# Patient Record
Sex: Female | Born: 1985 | Race: Black or African American | Hispanic: No | Marital: Single | Smoking: Never smoker
Health system: Southern US, Community
[De-identification: ages and names within clinical notes are randomized; demographics above are authoritative.]

## PROBLEM LIST (undated history)

## (undated) ENCOUNTER — Inpatient Hospital Stay (HOSPITAL_COMMUNITY): Payer: Self-pay

## (undated) DIAGNOSIS — T783XXA Angioneurotic edema, initial encounter: Secondary | ICD-10-CM

## (undated) DIAGNOSIS — K649 Unspecified hemorrhoids: Secondary | ICD-10-CM

## (undated) DIAGNOSIS — M549 Dorsalgia, unspecified: Secondary | ICD-10-CM

## (undated) DIAGNOSIS — Z8669 Personal history of other diseases of the nervous system and sense organs: Secondary | ICD-10-CM

## (undated) DIAGNOSIS — R7303 Prediabetes: Secondary | ICD-10-CM

## (undated) DIAGNOSIS — E249 Cushing's syndrome, unspecified: Secondary | ICD-10-CM

## (undated) DIAGNOSIS — M1990 Unspecified osteoarthritis, unspecified site: Secondary | ICD-10-CM

## (undated) DIAGNOSIS — Z86718 Personal history of other venous thrombosis and embolism: Secondary | ICD-10-CM

## (undated) DIAGNOSIS — M2550 Pain in unspecified joint: Secondary | ICD-10-CM

## (undated) DIAGNOSIS — K219 Gastro-esophageal reflux disease without esophagitis: Secondary | ICD-10-CM

## (undated) DIAGNOSIS — R011 Cardiac murmur, unspecified: Secondary | ICD-10-CM

## (undated) DIAGNOSIS — I10 Essential (primary) hypertension: Secondary | ICD-10-CM

## (undated) DIAGNOSIS — R079 Chest pain, unspecified: Secondary | ICD-10-CM

## (undated) DIAGNOSIS — Z319 Encounter for procreative management, unspecified: Secondary | ICD-10-CM

## (undated) DIAGNOSIS — R002 Palpitations: Secondary | ICD-10-CM

## (undated) DIAGNOSIS — G4730 Sleep apnea, unspecified: Secondary | ICD-10-CM

## (undated) DIAGNOSIS — R160 Hepatomegaly, not elsewhere classified: Secondary | ICD-10-CM

## (undated) DIAGNOSIS — G43019 Migraine without aura, intractable, without status migrainosus: Secondary | ICD-10-CM

## (undated) DIAGNOSIS — R0602 Shortness of breath: Secondary | ICD-10-CM

## (undated) DIAGNOSIS — D497 Neoplasm of unspecified behavior of endocrine glands and other parts of nervous system: Secondary | ICD-10-CM

## (undated) DIAGNOSIS — O26899 Other specified pregnancy related conditions, unspecified trimester: Secondary | ICD-10-CM

## (undated) DIAGNOSIS — G5600 Carpal tunnel syndrome, unspecified upper limb: Secondary | ICD-10-CM

## (undated) DIAGNOSIS — K829 Disease of gallbladder, unspecified: Secondary | ICD-10-CM

## (undated) DIAGNOSIS — Z3490 Encounter for supervision of normal pregnancy, unspecified, unspecified trimester: Secondary | ICD-10-CM

## (undated) DIAGNOSIS — N926 Irregular menstruation, unspecified: Secondary | ICD-10-CM

## (undated) DIAGNOSIS — E559 Vitamin D deficiency, unspecified: Secondary | ICD-10-CM

## (undated) DIAGNOSIS — R519 Headache, unspecified: Secondary | ICD-10-CM

## (undated) DIAGNOSIS — K5900 Constipation, unspecified: Secondary | ICD-10-CM

## (undated) DIAGNOSIS — M7989 Other specified soft tissue disorders: Secondary | ICD-10-CM

## (undated) DIAGNOSIS — D649 Anemia, unspecified: Secondary | ICD-10-CM

## (undated) DIAGNOSIS — R319 Hematuria, unspecified: Secondary | ICD-10-CM

## (undated) DIAGNOSIS — M722 Plantar fascial fibromatosis: Secondary | ICD-10-CM

## (undated) DIAGNOSIS — L509 Urticaria, unspecified: Secondary | ICD-10-CM

## (undated) DIAGNOSIS — G935 Compression of brain: Secondary | ICD-10-CM

## (undated) HISTORY — DX: Dorsalgia, unspecified: M54.9

## (undated) HISTORY — DX: Hepatomegaly, not elsewhere classified: R16.0

## (undated) HISTORY — DX: Personal history of other venous thrombosis and embolism: Z86.718

## (undated) HISTORY — DX: Cardiac murmur, unspecified: R01.1

## (undated) HISTORY — DX: Carpal tunnel syndrome, unspecified upper limb: G56.00

## (undated) HISTORY — DX: Neoplasm of unspecified behavior of endocrine glands and other parts of nervous system: D49.7

## (undated) HISTORY — DX: Personal history of other diseases of the nervous system and sense organs: Z86.69

## (undated) HISTORY — DX: Headache, unspecified: R51.9

## (undated) HISTORY — DX: Hematuria, unspecified: R31.9

## (undated) HISTORY — DX: Urticaria, unspecified: L50.9

## (undated) HISTORY — DX: Vitamin D deficiency, unspecified: E55.9

## (undated) HISTORY — PX: OTHER SURGICAL HISTORY: SHX169

## (undated) HISTORY — DX: Cushing's syndrome, unspecified: E24.9

## (undated) HISTORY — DX: Prediabetes: R73.03

## (undated) HISTORY — DX: Unspecified osteoarthritis, unspecified site: M19.90

## (undated) HISTORY — DX: Gastro-esophageal reflux disease without esophagitis: K21.9

## (undated) HISTORY — DX: Chest pain, unspecified: R07.9

## (undated) HISTORY — PX: TONSILLECTOMY: SUR1361

## (undated) HISTORY — DX: Essential (primary) hypertension: I10

## (undated) HISTORY — DX: Irregular menstruation, unspecified: N92.6

## (undated) HISTORY — DX: Pain in unspecified joint: M25.50

## (undated) HISTORY — DX: Palpitations: R00.2

## (undated) HISTORY — DX: Shortness of breath: R06.02

## (undated) HISTORY — DX: Unspecified hemorrhoids: K64.9

## (undated) HISTORY — DX: Other specified soft tissue disorders: M79.89

## (undated) HISTORY — DX: Disease of gallbladder, unspecified: K82.9

## (undated) HISTORY — DX: Anemia, unspecified: D64.9

## (undated) HISTORY — DX: Encounter for supervision of normal pregnancy, unspecified, unspecified trimester: Z34.90

## (undated) HISTORY — DX: Plantar fascial fibromatosis: M72.2

## (undated) HISTORY — DX: Compression of brain: G93.5

## (undated) HISTORY — DX: Other specified pregnancy related conditions, unspecified trimester: G56.00

## (undated) HISTORY — DX: Encounter for procreative management, unspecified: Z31.9

## (undated) HISTORY — DX: Angioneurotic edema, initial encounter: T78.3XXA

## (undated) HISTORY — DX: Constipation, unspecified: K59.00

## (undated) HISTORY — DX: Carpal tunnel syndrome, unspecified upper limb: O26.899

## (undated) HISTORY — DX: Migraine without aura, intractable, without status migrainosus: G43.019

---

## 2006-10-13 ENCOUNTER — Ambulatory Visit: Payer: Self-pay | Admitting: Cardiology

## 2006-11-17 ENCOUNTER — Ambulatory Visit: Payer: Self-pay | Admitting: Cardiology

## 2007-10-18 ENCOUNTER — Other Ambulatory Visit: Admission: RE | Admit: 2007-10-18 | Discharge: 2007-10-18 | Payer: Self-pay | Admitting: Obstetrics and Gynecology

## 2007-11-15 ENCOUNTER — Other Ambulatory Visit: Admission: RE | Admit: 2007-11-15 | Discharge: 2007-11-15 | Payer: Self-pay | Admitting: Obstetrics and Gynecology

## 2008-08-26 ENCOUNTER — Other Ambulatory Visit: Admission: RE | Admit: 2008-08-26 | Discharge: 2008-08-26 | Payer: Self-pay | Admitting: Obstetrics & Gynecology

## 2009-02-03 ENCOUNTER — Other Ambulatory Visit: Admission: RE | Admit: 2009-02-03 | Discharge: 2009-02-03 | Payer: Self-pay | Admitting: Obstetrics and Gynecology

## 2009-09-15 ENCOUNTER — Encounter: Payer: Self-pay | Admitting: Physician Assistant

## 2009-09-24 ENCOUNTER — Encounter: Payer: Self-pay | Admitting: Physician Assistant

## 2009-10-10 DIAGNOSIS — E249 Cushing's syndrome, unspecified: Secondary | ICD-10-CM

## 2009-10-10 DIAGNOSIS — R7303 Prediabetes: Secondary | ICD-10-CM

## 2009-10-10 HISTORY — DX: Prediabetes: R73.03

## 2009-10-10 HISTORY — DX: Cushing's syndrome, unspecified: E24.9

## 2009-10-10 LAB — CONVERTED CEMR LAB: Pap Smear: NORMAL

## 2010-02-08 ENCOUNTER — Encounter: Payer: Self-pay | Admitting: Physician Assistant

## 2010-02-08 ENCOUNTER — Other Ambulatory Visit: Admission: RE | Admit: 2010-02-08 | Discharge: 2010-02-08 | Payer: Self-pay | Admitting: Obstetrics and Gynecology

## 2010-06-21 ENCOUNTER — Emergency Department (HOSPITAL_COMMUNITY): Admission: EM | Admit: 2010-06-21 | Discharge: 2010-06-22 | Payer: Self-pay | Admitting: Emergency Medicine

## 2010-06-23 ENCOUNTER — Ambulatory Visit: Payer: Self-pay | Admitting: Family Medicine

## 2010-06-23 DIAGNOSIS — R0789 Other chest pain: Secondary | ICD-10-CM | POA: Insufficient documentation

## 2010-06-23 DIAGNOSIS — D649 Anemia, unspecified: Secondary | ICD-10-CM | POA: Insufficient documentation

## 2010-06-23 DIAGNOSIS — R609 Edema, unspecified: Secondary | ICD-10-CM | POA: Insufficient documentation

## 2010-06-24 ENCOUNTER — Encounter: Payer: Self-pay | Admitting: Family Medicine

## 2010-06-25 ENCOUNTER — Encounter: Payer: Self-pay | Admitting: Physician Assistant

## 2010-06-28 ENCOUNTER — Telehealth: Payer: Self-pay | Admitting: Family Medicine

## 2010-06-29 ENCOUNTER — Telehealth: Payer: Self-pay | Admitting: Physician Assistant

## 2010-06-29 LAB — CONVERTED CEMR LAB
BUN: 9 mg/dL (ref 6–23)
CO2: 30 meq/L (ref 19–32)
Calcium: 8.5 mg/dL (ref 8.4–10.5)
Chloride: 104 meq/L (ref 96–112)
Creatinine, Ser: 0.78 mg/dL (ref 0.40–1.20)
Ferritin: 4 ng/mL — ABNORMAL LOW (ref 10–291)
HCT: 37.7 % (ref 36.0–46.0)
Iron: 31 ug/dL — ABNORMAL LOW (ref 42–145)
MCHC: 31.3 g/dL (ref 30.0–36.0)
Platelets: 428 10*3/uL — ABNORMAL HIGH (ref 150–400)
RDW: 16.4 % — ABNORMAL HIGH (ref 11.5–15.5)
TSH: 0.898 microintl units/mL (ref 0.350–4.500)
WBC: 10.4 10*3/uL (ref 4.0–10.5)

## 2010-07-07 ENCOUNTER — Ambulatory Visit: Payer: Self-pay | Admitting: Family Medicine

## 2010-07-07 ENCOUNTER — Encounter: Payer: Self-pay | Admitting: Physician Assistant

## 2010-07-07 DIAGNOSIS — I499 Cardiac arrhythmia, unspecified: Secondary | ICD-10-CM | POA: Insufficient documentation

## 2010-07-07 DIAGNOSIS — E663 Overweight: Secondary | ICD-10-CM | POA: Insufficient documentation

## 2010-07-07 DIAGNOSIS — E669 Obesity, unspecified: Secondary | ICD-10-CM | POA: Insufficient documentation

## 2010-07-16 ENCOUNTER — Telehealth: Payer: Self-pay | Admitting: Physician Assistant

## 2010-07-30 ENCOUNTER — Telehealth: Payer: Self-pay | Admitting: Family Medicine

## 2010-08-02 ENCOUNTER — Encounter: Payer: Self-pay | Admitting: Physician Assistant

## 2010-08-02 LAB — CONVERTED CEMR LAB
BUN: 7 mg/dL (ref 6–23)
Calcium: 8.9 mg/dL (ref 8.4–10.5)
Chloride: 103 meq/L (ref 96–112)
Creatinine, Ser: 0.71 mg/dL (ref 0.40–1.20)
Glucose, Bld: 99 mg/dL (ref 70–99)

## 2010-08-04 ENCOUNTER — Encounter: Payer: Self-pay | Admitting: Family Medicine

## 2010-08-04 ENCOUNTER — Telehealth: Payer: Self-pay | Admitting: Family Medicine

## 2010-08-26 ENCOUNTER — Ambulatory Visit: Payer: Self-pay | Admitting: Family Medicine

## 2010-08-26 DIAGNOSIS — R635 Abnormal weight gain: Secondary | ICD-10-CM | POA: Insufficient documentation

## 2010-08-26 DIAGNOSIS — I10 Essential (primary) hypertension: Secondary | ICD-10-CM | POA: Insufficient documentation

## 2010-08-27 ENCOUNTER — Encounter: Payer: Self-pay | Admitting: Family Medicine

## 2010-09-06 ENCOUNTER — Telehealth: Payer: Self-pay | Admitting: Endocrinology

## 2010-09-07 ENCOUNTER — Ambulatory Visit: Payer: Self-pay | Admitting: Endocrinology

## 2010-09-08 ENCOUNTER — Ambulatory Visit: Payer: Self-pay | Admitting: Endocrinology

## 2010-09-10 ENCOUNTER — Encounter: Payer: Self-pay | Admitting: Endocrinology

## 2010-09-10 LAB — CONVERTED CEMR LAB

## 2010-09-14 ENCOUNTER — Ambulatory Visit: Payer: Self-pay | Admitting: Endocrinology

## 2010-09-14 DIAGNOSIS — E249 Cushing's syndrome, unspecified: Secondary | ICD-10-CM | POA: Insufficient documentation

## 2010-09-14 LAB — CONVERTED CEMR LAB: Cortisol, Plasma: 16.3 ug/dL

## 2010-09-15 ENCOUNTER — Telehealth: Payer: Self-pay | Admitting: Endocrinology

## 2010-09-16 ENCOUNTER — Encounter: Payer: Self-pay | Admitting: Endocrinology

## 2010-09-16 ENCOUNTER — Telehealth: Payer: Self-pay | Admitting: Endocrinology

## 2010-09-17 ENCOUNTER — Encounter: Payer: Self-pay | Admitting: Endocrinology

## 2010-09-21 ENCOUNTER — Ambulatory Visit: Payer: Self-pay | Admitting: Family Medicine

## 2010-09-23 ENCOUNTER — Encounter: Payer: Self-pay | Admitting: Family Medicine

## 2010-09-27 LAB — CONVERTED CEMR LAB
Metaneph Total, Ur: 363 ug/24hr (ref 94–604)
Metanephrines, Ur: 50 (ref 25–222)
Normetanephrine, 24H Ur: 313 (ref 40–412)

## 2010-10-01 ENCOUNTER — Encounter: Payer: Self-pay | Admitting: Family Medicine

## 2010-10-28 ENCOUNTER — Encounter: Payer: Self-pay | Admitting: Family Medicine

## 2010-10-28 HISTORY — PX: LAPAROSCOPIC ADRENALECTOMY: SHX999

## 2010-10-29 DIAGNOSIS — E270 Other adrenocortical overactivity: Secondary | ICD-10-CM | POA: Insufficient documentation

## 2010-10-31 ENCOUNTER — Encounter: Payer: Self-pay | Admitting: Family Medicine

## 2010-11-08 ENCOUNTER — Encounter: Payer: Self-pay | Admitting: Family Medicine

## 2010-11-09 NOTE — Assessment & Plan Note (Signed)
Summary: NEW PATIENT- room 1   Vital Signs:  Patient profile:   25 year old female Height:      63.25 inches Weight:      210.75 pounds BMI:     37.17 O2 Sat:      100 % on Room air Pulse rate:   87 / minute Resp:     16 per minute BP sitting:   130 / 90  (left arm)  Vitals Entered By: Adella Hare LPN (June 23, 2010 2:22 PM)  Nutrition Counseling: Patient's BMI is greater than 25 and therefore counseled on weight management options. CC: new patient Is Patient Diabetic? No Comments  pain bilateral legs from knees to feet   CC:  new patient.  History of Present Illness: New pt here to establish care with new PCP.  Pt presents today with c/o swelling bilat LE x approx 1 week.  Worse at end of day.  No prev hx of. Had Lt sided chest pain and pressure day she went to ER ( 9/12).  Was in Lt chest and shoulder area. No difficulty breathing.  Rxd Furosemide and has helped some with swelling.  Has not taken today. No added salt while cooking or eating.  Has been following a diet of veggies, and lean meats mostly. No travel.  Attending college, sits for 2-3 hrs, then long ride home (1hr).  Swelling worse after.    Current Medications (verified): 1)  Loestrin 24 Fe 1-20 Mg-Mcg Tabs (Norethin Ace-Eth Estrad-Fe) .... Once Daily 2)  Furosemide 20 Mg Tabs (Furosemide) .... One Tab By Mouth Two Times A Day  Allergies (verified): 1)  ! Pcn  Past History:  Past medical, surgical, family and social histories (including risk factors) reviewed, and no changes noted (except as noted below).  Past Medical History: PCOS  Past Surgical History: Denies surgical history Tonsillectomy  Family History: Reviewed history and no changes required. mother living- HTN, Hyperlipidemia father living- HTN, OSA one sister living- presumed healthy  Social History: Reviewed history and no changes required. 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 CV:  Complains of chest pain or discomfort; denies palpitations. Resp:  Denies cough and shortness of breath. GI:  Denies abdominal pain.  Physical Exam  General:  Well-developed,well-nourished,in no acute distress; alert,appropriate and cooperative throughout examination Head:  Normocephalic and atraumatic without obvious abnormalities. No apparent alopecia or balding. Ears:  External ear exam shows no significant lesions or deformities.  Otoscopic examination reveals clear canals, tympanic membranes are intact bilaterally without bulging, retraction, inflammation or discharge. Hearing is grossly normal bilaterally. Nose:  External nasal examination shows no deformity or inflammation. Nasal mucosa are pink and moist without lesions or exudates. Mouth:  Oral mucosa and oropharynx without lesions or exudates.  Teeth in good repair. Neck:  No deformities, masses, or tenderness noted. Lungs:  Normal respiratory effort, chest expands symmetrically. Lungs are clear to auscultation, no crackles or wheezes. Heart:  Normal rate and regular rhythm. S1 and S2 normal without gallop, murmur, click, rub or other extra sounds. Pulses:  R posterior tibial normal, R dorsalis pedis normal, L posterior tibial normal, and L dorsalis pedis normal.   Extremities:  Tr PTE bilat Neurologic:  alert & oriented X3, sensation intact to light touch, gait normal, and DTRs symmetrical and normal.  Skin:  Intact without suspicious lesions or rashes Cervical Nodes:  No lymphadenopathy noted Psych:  Cognition and judgment appear intact. Alert and cooperative with normal attention span and concentration. No apparent delusions, illusions, hallucinations   Impression & Recommendations:  Problem # 1:  PERIPHERAL EDEMA (ICD-782.3) Assessment New  Her updated medication list  for this problem includes:    Furosemide 20 Mg Tabs (Furosemide) ..... One tab by mouth two times a day  Orders: T-Basic Metabolic Panel [id number]) T-TSH [phone])  Problem # 2:  ANEMIA (ICD-285.9) Assessment: New  Orders: T-CBC No Diff ([id number]) T-Iron ([id number]) T-Ferritin ([id number])  Problem # 3:  CHEST PAIN, ATYPICAL (ICD-786.59) Assessment: New  Orders: EKG w/ Interpretation (93000)  Complete Medication List: 1)  Loestrin 24 Fe 1-20 Mg-mcg Tabs (Norethin ace-eth estrad-fe) .... Once daily 2)  Furosemide 20 Mg Tabs (Furosemide) .... One tab by mouth two times a day  Other Orders: Influenza Vaccine NON MCR (02725)  Patient Instructions: 1)  Please schedule a follow-up appointment in 2 weeks. 2)  Limit your Sodium (Salt) to less than 2 grams a day(slightly less than 1/2 a teaspoon) to prevent fluid retention, swelling, or worsening of symptoms. 3)  Elevate your legs and wear support stockings. 4)  It is important that you exercise regularly at least 20 minutes 5 times a week. If you develop chest pain, have severe difficulty breathing, or feel very tired , stop exercising immediately and seek medical attention. 5)  You need to lose weight. Consider a lower calorie diet and regular exercise.    Influenza Vaccine    Vaccine Type: Fluvax Non-MCR    Site: left deltoid    Mfr: novartis    Dose: 0.5 ml    Route: IM    Given by: Adella Hare LPN    Exp. Date: 02/2011    Lot #: [id number]    VIS given: 05/04/10 version given June 23, 2010.

## 2010-11-09 NOTE — Progress Notes (Signed)
Summary: lab results  Phone Note Call from Patient   Summary of Call: would like to get results of lab work. [phone] Initial call taken by: Rudene Anda,  June 28, 2010 3:36 PM  Follow-up for Phone Call        returned call, no answer Follow-up by: Adella Hare LPN,  June 28, 2010 4:50 PM

## 2010-11-09 NOTE — Progress Notes (Signed)
Summary: 24hr urine result  Phone Note Call from Patient   Caller: Patient [phone] Summary of Call: Pt called and states she is anxious to get results back of 24 hr urine done last week. Pt says her sxs are worse, increased swelling, numbness in RT and LT hands and HA. Please advise. Initial call taken by: Margaret Pyle, CMA,  September 15, 2010 11:36 AM  Follow-up for Phone Call        i called pt 09/15/10.  i'll refer to [facility] or [facility] Follow-up by: Minus Breeding MD,  September 15, 2010 1:07 PM

## 2010-11-09 NOTE — Assessment & Plan Note (Signed)
Summary: NEW [employer] [facility] PT GAIN 55 LBS IN 6 MTHS-PER LOUANN/DR SIMPSON-...   Vital Signs:  Patient profile:   25 year old female Menstrual status:  regular LMP:     08/20/2010 Height:      63 inches (160.02 cm) Weight:      215 pounds (97.73 kg) BMI:     38.22 O2 Sat:      98 % on Room air Temp:     99.3 degrees F (37.39 degrees C) oral Pulse rate:   105 / minute BP sitting:   122 / 74  (left arm) Cuff size:   large  Vitals Entered By: Brenton Grills CMA Duncan Dull) (September 07, 2010 2:06 PM)  O2 Flow:  Room air CC: New Endo/Abnormal Weight Gain/Cushing's Syndrome?/Dr. Simpson/aj Is Patient Diabetic? No LMP (date): 08/20/2010     Enter LMP: 08/20/2010 Last PAP Result normal   CC:  New Endo/Abnormal Weight Gain/Cushing's Syndrome?/Dr. Simpson/aj.  History of Present Illness: pt states 6 mos of severe weight gain, worst at the abdominal area, and assoc edema.  she was seen at er at [facility] 10 days ago, where she was noted to have generalized red rash.  she also has recently developed htn.  she cannot assess any sxs of slow wound healing, as she has had no recent surgery or injury.  Current Medications (verified): 1)  Ferrous Sulfate 325 (65 Fe) Mg Tabs (Ferrous Sulfate) .... Take 1 Daily 2)  Maxzide-25 37.5-25 Mg Tabs (Triamterene-Hctz) .... Take 1 Tablet By Mouth Once A Day 3)  Multivitamin .... One Daily  Allergies (verified): 1)  ! Pcn  Past History:  Past Medical History: Last updated: 07/07/2010 Unremarkable  Family History: mother living- HTN, Hyperlipidemia father living- HTN, OSA one sister living- presumed healthy  Social History: Reviewed history from 06/23/2010 and no changes required. 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  The patient denies fever.         denies polyuria and rhinorrhea.  she has intermittent  doe.  she has striae on the arms and abdomen.  she just stopped oral contraceptives, so she cannot assess menses.  she has chronic hirsutism, easy bruising, muscle weakness, muscle cramps, insomnia, and hyperhidrosis.  she has hair loss on the head, at the frontal and crown areas.  she says her skin color is darkening in general.  she has numbness of the left little finger.  Physical Exam  General:  morbidly obese.  no distress  Head:  head: no deformity.  i cannot appreciate any  hair loss eyes: no periorbital swelling, no proptosis external nose and ears are normal mouth: no lesion seen Neck:  Supple without thyroid enlargement or tenderness.  Lungs:  Clear to auscultation bilaterally. Normal respiratory effort.  Heart:  Regular rate and rhythm without murmurs or gallops noted. Normal S1,S2.   Abdomen:  abdomen is soft, nontender.  no hepatosplenomegaly.   not distended.  no hernia  Msk:  muscle bulk and strength are grossly normal.  no obvious joint swelling.  gait is normal and steady  Extremities:  trace right pedal edema and trace left pedal edema.   Neurologic:  cn 2-12 grossly intact.   readily moves all 4's.   sensation is intact to touch on the feet  Skin:  there are slightly pink striae at the axillae, abdomen, and flanks there  is moderate hirsutism on the face and abdomen no ecchymoses not diaphoretic. Cervical Nodes:  No significant adenopathy.  Psych:  Alert and cooperative; normal mood and affect; normal attention span and concentration.     Impression & Recommendations:  Problem # 1:  ABNORMAL WEIGHT GAIN (ICD-783.1) endogenous cushing's needs to be excluded  Problem # 2:  HYPERTENSION (ICD-401.9) often seen in cushing's well-controlled  Problem # 3:  hair loss i am unable to appreciate this on exam  Medications Added to Medication List This Visit: 1)  Dexamethasone 1 Mg Tabs (Dexamethasone) .... Take at 9-10 pm, the night before blood test  Other  Orders: T- * Misc. Laboratory test [phone]) Consultation Level IV [phone])  Patient Instructions: 1)  check 24-hr urine for cortisone.  then: 2)  you should do a "dexamethasone suppression test."  for this, you would take dexamethasone 1 mg at 10 pm, then come in for a "cortisol" blood test the next morning before 9 am.  you do not need to be fasting for this test.   3)  please call [phone] to hear each of these test results. 4)  (cortisol 783.1) Prescriptions: DEXAMETHASONE 1 MG TABS (DEXAMETHASONE) take at 9-10 pm, the night before blood test  #1 x 0   Entered and Authorized by:   Minus Breeding MD   Signed by:   Minus Breeding MD on 09/07/2010   Method used:   Electronically to        [facility]* (retail)       [address]       [place], [place]  [address]       Ph: [phone]       Fax: [phone]   RxID:   [id number]    Orders Added: 1)  T- * Misc. Laboratory test [99999] 2)  Consultation Level IV [25956]    Preventive Care Screening  Pap Smear:    Date:  10/10/2009    Results:  normal   Last Tetanus Booster:    Date:  10/11/2003    Results:  Historical

## 2010-11-09 NOTE — Progress Notes (Signed)
Summary: CALL BACK  Phone Note Call from Patient   Summary of Call: CALL BACK AT [phone] Initial call taken by: Lind Guest,  June 29, 2010 2:03 PM  Follow-up for Phone Call        can we send in some different birth control meds? generic Follow-up by: Adella Hare LPN,  June 29, 2010 2:45 PM  Additional Follow-up for Phone Call Additional follow up Details #1::        We are not the rxing provider for her birth control. Additional Follow-up by: Esperanza Sheets PA,  June 29, 2010 2:56 PM    Additional Follow-up for Phone Call Additional follow up Details #2::    she wants to know if you will though since she is coming here now Follow-up by: Adella Hare LPN,  June 29, 2010 3:11 PM  Additional Follow-up for Phone Call Additional follow up Details #3:: Details for Additional Follow-up Action Taken: I have changed her OCP prescription in computer to Aviane.  This is generic. She may have 1 mos (28 day).  We will need pap results if has been < 1hr since her last pap, or she will need to come in for pap if has been > 1 yr before further refills. Once you find out the pharmacy she wishes to use , please send prescription to pharmacy. Additional Follow-up by: Esperanza Sheets PA,  June 29, 2010 3:27 PM  New/Updated Medications: AVIANE 0.1-20 MG-MCG TABS (LEVONORGESTREL-ETHINYL ESTRAD) take 1 daily Prescriptions: AVIANE 0.1-20 MG-MCG TABS (LEVONORGESTREL-ETHINYL ESTRAD) take 1 daily  #28day x 0   Entered by:   Adella Hare LPN   Authorized by:   Esperanza Sheets PA   Signed by:   Adella Hare LPN on 60/07/9322   Method used:   Electronically to        [facility]* (retail)       [address]       [place], [place]  [address]       Ph: [phone]       Fax: [phone]   RxID:   [id number] AVIANE 0.1-20 MG-MCG TABS (LEVONORGESTREL-ETHINYL ESTRAD) take 1 daily  #28day x 0   Entered and Authorized by:   Esperanza Sheets PA   Signed by:   Esperanza Sheets PA on 06/29/2010   Method used:   Historical   RxID:   [id number]

## 2010-11-09 NOTE — Progress Notes (Signed)
  Phone Note Call from Patient   Summary of Call: Since starting phentermine, she has had dry mouth, tip of tongue feeling numb like and vaginal area has been red and irritated and she had been taking the pill everymorning. She has not seen any results with it anyway so I advised her to stop taking it. She wanted to know if these were common side effect and if she needed something for the vaginal irritation   [facility]  Initial call taken by: Everitt Amber LPN,  July 16, 2010 3:43 PM  Follow-up for Phone Call        Dryness is a common side effect.  She can wait and see if the vaginal irritation improves with discontinuing, or she can try an over the counter yeast cream to see if helps.  Otherwise will need appt next week to check her. Follow-up by: Esperanza Sheets PA,  July 16, 2010 3:46 PM  Additional Follow-up for Phone Call Additional follow up Details #1::        called patient, no answer Additional Follow-up by: Everitt Amber LPN,  July 16, 2010 4:13 PM    Additional Follow-up for Phone Call Additional follow up Details #2::    Patient feeling better. Follow-up by: Everitt Amber LPN,  July 19, 2010 9:41 AM

## 2010-11-09 NOTE — Progress Notes (Signed)
Summary: please advise  Phone Note Call from Patient   Summary of Call: patient left msg. stating she has numbness in her left pinky going down, she has been having a lot of muscle cramps, very tired, she states she has swelling, and she stopped taking her lactus (didn't see the correct spelling) 1 week ago.  She will be doing clinicals this afternoon at the hospital her contact number is [phone].  She said you could leave her a message.  She thinks the leg cramps are coming from low potassium.Please advise. Initial call taken by: Curtis Sites,  July 30, 2010 3:04 PM  Follow-up for Phone Call        fyi, when patient's call after midday on a friday, pld mke them aeaware there will be no response before the following week unless it is an emergency. She needs to take potassium with her lasix, i will enter this in a historic script, pls fax after you speak to her, she should also get a chem 7 repeated, not stat, pls order Follow-up by: Syliva Overman MD,  August 02, 2010 6:12 AM    New/Updated Medications: KLOR-CON M20 20 MEQ CR-TABS (POTASSIUM CHLORIDE CRYS CR) Take 1 tablet by mouth two times a day on the same days that you take furosemide Prescriptions: KLOR-CON M20 20 MEQ CR-TABS (POTASSIUM CHLORIDE CRYS CR) Take 1 tablet by mouth two times a day on the same days that you take furosemide  #60 x 0   Entered and Authorized by:   Syliva Overman MD   Signed by:   Syliva Overman MD on 08/02/2010   Method used:   Historical   RxID:   [id number]   Appended Document: please advise Prescriptions: KLOR-CON M20 20 MEQ CR-TABS (POTASSIUM CHLORIDE CRYS CR) Take 1 tablet by mouth two times a day on the same days that you take furosemide  #60 x 0   Entered by:   Mauricia Area CMA   Authorized by:   Syliva Overman MD   Signed by:   Mauricia Area CMA on 08/02/2010   Method used:   Electronically to        [facility]* (retail)       [address]       [place], [place]  [address]       Ph: [phone]       Fax: [phone]   RxID:   [id number]   Appended Document: please advise Called, left message  Appended Document: please advise Patient aware

## 2010-11-09 NOTE — Progress Notes (Signed)
Summary: NEW PT  Phone Note Call from Patient Call back at Home Phone [phone]   Summary of Call: Pt will be new endo pt tomorrow and was told by [facility] she may have cushing's. She wants to know if she can go ahead and collect a 24hr urine sample to bring in tomorrow?  Initial call taken by: Lamar Sprinkles, CMA,  September 06, 2010 9:34 AM  Follow-up for Phone Call        let's address at [facility] tomorrow Follow-up by: Minus Breeding MD,  September 06, 2010 9:35 AM  Additional Follow-up for Phone Call Additional follow up Details #1::        Notified pt with md reponse Additional Follow-up by: Orlan Leavens RMA,  September 06, 2010 11:50 AM

## 2010-11-09 NOTE — Progress Notes (Signed)
  Phone Note Call from Patient Call back at [facility] Phone [phone]   Caller: Patient [phone] Summary of Call: Pt called to ask MD why she is being referred to another Endo, please advise. Initial call taken by: Margaret Pyle, CMA,  September 15, 2010 3:07 PM  Follow-up for Phone Call        the high cortisone level is a rare medical condition, which should be checked at a big medical center.  very few doctors have experience with this.   Follow-up by: Minus Breeding MD,  September 15, 2010 5:21 PM  Additional Follow-up for Phone Call Additional follow up Details #1::        Pt advised and has appt today  Additional Follow-up by: Margaret Pyle, CMA,  September 16, 2010 8:53 AM

## 2010-11-09 NOTE — Letter (Signed)
Summary: menu meal plan  menu meal plan   Imported By: Lind Guest 08/27/2010 14:39:02  _____________________________________________________________________  External Attachment:    Type:   Image     Comment:   External Document

## 2010-11-09 NOTE — Progress Notes (Signed)
Summary: 24hr urine?  Phone Note Call from Patient Call back at Home Phone [phone]   Caller: Patient Summary of Call: Pt called stating she has appt today with Endo specialist but wnats to know what Dr Everardo All would like her to do with 24hr urine specimen, she says she was advised by MD that he was going to be testing for something else. Please advise. Initial call taken by: Margaret Pyle, CMA,  September 16, 2010 9:03 AM  Follow-up for Phone Call        please return the 2nd specimen to the lab also Follow-up by: Minus Breeding MD,  September 16, 2010 12:46 PM  Additional Follow-up for Phone Call Additional follow up Details #1::        Pt advised and will drop off specimen today Additional Follow-up by: Margaret Pyle, CMA,  September 16, 2010 1:22 PM

## 2010-11-09 NOTE — Letter (Signed)
Summary: [facility]  [facility]   Imported By: Lind Guest 07/07/2010 14:24:02  _____________________________________________________________________  External Attachment:    Type:   Image     Comment:   External Document

## 2010-11-09 NOTE — Letter (Signed)
Summary: Immunization/Shot Record  [facility]  [address]   [place], [place] [address]   Phone: [phone]  Fax: [phone]     Immunization Record for: Janet [name]  Vaccine 1 2 3 4 5 6  HepB Hepatitis B                    DTP Diphtheria, Tetanus, Pertussis                         HIB Haemophilus influenzae Type b                 ZHYQMVHQIO IPV Inactivated Poliovirus             MMR Measles, Mumps, [name] NGEXBMWUXL KGMWNUUVOZ Varicella Varivax    DGUYQIHKVQ QVZDGLOVFI EPPIRJJOAC Pneumococcal           Hep A Hepatitis A   ZYSAYTKZSW FUXNATFTDD UKGURKYHCW CBJSEGBTDV        Tetanus Booster Date of Last:    Flu Shot Date of Last: 06/23/2010 Pneumovax Date of Last:  Meningococcal Vaccine Given:       Other Vaccines HPV Vaccine/ Date of Last:    Vaccine/ Date of Last:    Vaccine/ Date of Last:     VOHYWVPXTG  GYIRSWNIOE  VOJJKKXFGH Rotavirus Vaccine/ Date of Last:    Vaccine/ Date of Last:    Vaccine/ Date of Last:     WEXHBZJIRC  [name]  VELFYBOFBP Zostavax Vaccine/ Date of Last:     [name]  ZWCHENIDPO  [name]  EUMPNTIRWE  RXVQMGQQPY  Recommended Childhood and Adolescent Immunization Schedule [place]  2006 Vaccine Age Birth 1 mos 2 mos 4 mos 6 mos 12 mos 15 mos 18 mos 24 mos 4-6 yrs 11-12 yrs 13-14 yrs 15 yrs 16-18 yrs Hepatitis B1 HepB HepB HepB1  HepB  HepB Series Catch-Up Diphtheria, Tetanus, Pertussis2   DTaP DTaP DTaP   DTaP  DTaP Tdap  Tdap Catch-Up Haemophilus influenzae type b3   Hib Hib Hib3 Hib        Inactivated Poliovirus   IPV IPV  IPV   IPV     Measles, Mumps, Rubella4      MMR   MMR M MR MMR Catch-Up Varicella5       Varicella  Varicella  Catch-Up Meningo-coccal6           MCV4  MCV4 CatchUpV4           MPSV4 for High Risk Groups  C MCV4 for High Risk Groups Pneumo-coccal7   PCV PCV PCV PCV   PCV  Catch-Up PPV for High Risk Groups         PPV for High Risk Groups  Influenza8      Influenza (Yearly)  Influenza (Yearly) for High Risk Groups Hepatitis A9       HepA Series  This schedule indicates the recommended ages for routine administration of currently licensed childhood vaccines, as of September 09, 2004, for children through age 40 years. Any dose not administered at the recommended age should be administered at any subsequent visit when indicated and feasible. Indicates age groups that warrant special effort to administer those vaccines not previously administered. Additional vaccines may be licensed and recommended during the year. Licensed combination vaccines may be used whenever any components of the combination are indicated and other components of the vaccine are not contraindicated and if approved by the Food and Drug  Administration for that dose of the series. Providers should consult the respective ACIP statement for detailed recommendations. Clinically significant adverse events that follow immunization should be reported to the Vaccine Adverse Event Reporting System (VAERS). Guidance about how to obtain and complete a VAERS form is available at www.vaers.LAgents.no or by telephone, [phone].  The Childhood and Adolescent Immunization Schedule is approved by: Advisory Committee on Administrator http://www.wade.com/   American Academy of Pediatrics BridgeDigest.com.cy   [employer].aafp.org

## 2010-11-09 NOTE — Assessment & Plan Note (Signed)
Summary: follow up- room 2   Vital Signs:  Patient profile:   25 year old female Height:      63.25 inches Weight:      214.25 pounds BMI:     37.79 O2 Sat:      100 % on Room air Pulse rate:   73 / minute Resp:     16 per minute BP sitting:   124 / 80  (left arm)  Vitals Entered By: Adella Hare LPN (July 07, 2010 10:52 AM) CC: follow-up visit- legs swollen hurting Is Patient Diabetic? No Pain Assessment Patient in pain? no        CC:  follow-up visit- legs swollen hurting.  History of Present Illness: Pt states that she ran out of Lasix a few days ago this did seem to help, though she would still have swelling at the end of the day.  The swelling started when she started OCPs about 2 mos ago. She started the OCPs because she was put on Metformin for PCOS.  She was dx with PCOS because of her wt gain and an elevated testosterone level.  Her LH:FSH ratio was not elevated, and no PUS was done.  Pt does not have irrg menses or amenorrhea, nor acne.  Mild increased hair growth lower abd.  She discontinued Metformin about 6 weeks ago due to GI intolerance, and wants to discontinue the OCPs as well. Has been working with a nutriotionist and exercising to try to help with her wt.    Allergies (verified): 1)  ! Pcn  Past History:  PMH updated  Past Medical History: Unremarkable  Review of Systems CV:  Denies chest pain or discomfort and palpitations. Resp:  Denies cough and shortness of breath. GI:  Denies abdominal pain, change in bowel habits, nausea, and vomiting. GU:  Denies abnormal vaginal bleeding.  Physical Exam  General:  Well-developed,well-nourished,in no acute distress; alert,appropriate and cooperative throughout examination Head:  Normocephalic and atraumatic without obvious abnormalities. No apparent alopecia or balding. Ears:  External ear exam shows no significant lesions or deformities.  Otoscopic examination reveals clear canals, tympanic membranes  are intact bilaterally without bulging, retraction, inflammation or discharge. Hearing is grossly normal bilaterally. Nose:  External nasal examination shows no deformity or inflammation. Nasal mucosa are pink and moist without lesions or exudates. Mouth:  Oral mucosa and oropharynx without lesions or exudates.  Teeth in good repair. Neck:  No deformities, masses, or tenderness noted.no thyromegaly and no thyroid nodules or tenderness.   Lungs:  Normal respiratory effort, chest expands symmetrically. Lungs are clear to auscultation, no crackles or wheezes. Heart:  no murmur and irregular rhythm.   Pulses:  R posterior tibial normal, R dorsalis pedis normal, L posterior tibial normal, and L dorsalis pedis normal.   Extremities:  trace left pedal edema and trace right pedal edema.   Neurologic:  alert & oriented X3 and gait normal.   Cervical Nodes:  No lymphadenopathy noted Psych:  Cognition and judgment appear intact. Alert and cooperative with normal attention span and concentration. No apparent delusions, illusions, hallucinations   Impression & Recommendations:  Problem # 1:  PERIPHERAL EDEMA (ICD-782.3) Assessment Unchanged Question related to OCP use.  Pt will discontinue use and end of this pill pack ( 1 1/2  weeks) Continue low sodium diet, elevating legs, etc. Her updated medication list for this problem includes:    Furosemide 20 Mg Tabs (Furosemide) ..... One tab by mouth two times a day  Problem #  2:  OBESITY (ICD-278.00) Assessment: Deteriorated Discussed PCOS dx with pt.  I dont think pt meets dx criteria, and would suggest that if she were to develop irrg menses then to obtain PUS. Pt to continue with increased exercise and healthy diet.  Will try Phentermine.  Complete Medication List: 1)  Aviane 0.1-20 Mg-mcg Tabs (Levonorgestrel-ethinyl estrad) .... Take 1 daily 2)  Furosemide 20 Mg Tabs (Furosemide) .... One tab by mouth two times a day 3)  Ferrous Sulfate 325 (65 Fe)  Mg Tabs (Ferrous sulfate) .... Take 1 daily 4)  Phentermine Hcl 37.5 Mg Caps (Phentermine hcl) .... Take 1 every morning  Other Orders: EKG w/ Interpretation (93000)  Patient Instructions: 1)  Please schedule a follow-up appointment in 2 months. 2)  I have refilled Furosemide for swelling.  Stop your birth control at the end of the pack as discussed. 3)  I have prescribed a diet pill to help you with weight loss. Prescriptions: PHENTERMINE HCL 37.5 MG CAPS (PHENTERMINE HCL) take 1 every morning  #30 x 1   Entered and Authorized by:   Esperanza Sheets PA   Signed by:   Esperanza Sheets PA on 07/07/2010   Method used:   Printed then faxed to ...       [facility]* (retail)       [address]       [place], [place]  [address]       Ph: [phone]       Fax: [phone]   RxID:   [id number] FUROSEMIDE 20 MG TABS (FUROSEMIDE) one tab by mouth two times a day  #30 x 0   Entered and Authorized by:   Esperanza Sheets PA   Signed by:   Esperanza Sheets PA on 07/07/2010   Method used:   Electronically to        [facility]* (retail)       [address]       [place], [place]  [address]       Ph: [phone]       Fax: [phone]   RxID:   [id number]

## 2010-11-09 NOTE — Assessment & Plan Note (Signed)
Summary: OV   Vital Signs:  Patient profile:   25 year old female Menstrual status:  regular LMP:     08/22/2010 Height:      63 inches Weight:      217.04 pounds O2 Sat:      96 % Pulse rate:   60 / minute Pulse rhythm:   regular BP sitting:   160 / 100  (left arm)  History of Present Illness: Pt states that in May 2011 she weighed 156.Has gained to 217 poundstoday, despite exercise, trainer ,and a nutrtionist she had never been over 160 pounds beforethis. She had been seeing a Systems analyst regularly sinceSummer 2011, and a nutritionist since Jubne 2010, just for health. At that time she weighed 142. She has always exercise regularly, but tis year she inc to twice daily,at least 1 hr daily.  Feels tight, swollen, alll the timefatigue, palpitations occasionally, states pulse irregular, abd distension, striae on  trunk and upper extremeties. No  known famh/o adrenal disease She denies depression, and over the years , in particular this past year she has been espescially diligent in adopting even healthier lifestyle as far as exercfise and diet are concerned to no avail    Allergies: 1)  ! Pcn  Review of Systems      See HPI General:  Complains of fatigue. Eyes:  Denies blurring and discharge. ENT:  Denies hoarseness, nasal congestion, and sinus pressure. CV:  Denies chest pain or discomfort, palpitations, and swelling of feet. Resp:  Denies cough, sputum productive, and wheezing. GI:  Denies abdominal pain, constipation, diarrhea, nausea, and vomiting. GU:  Denies dysuria and urinary frequency. MS:  Denies joint pain and stiffness. Derm:  Denies itching, lesion(s), and rash. Neuro:  Denies falling down, headaches, memory loss, and numbness. Psych:  Denies anxiety and depression. Endo:  Complains of weight change; denies cold intolerance, excessive hunger, excessive thirst, excessive urination, heat intolerance, and polyuria. Heme:  Denies abnormal bruising and  bleeding. Allergy:  Denies hives or rash and itching eyes.  Physical Exam  General:  Well-developed,obese iin no acute distress; alert,appropriate and cooperative throughout examination HEENT: No facial asymmetry,  EOMI, No sinus tenderness, TM's Clear, oropharynx  pink and moist.   Chest: Clear to auscultation bilaterally.  CVS: S1, S2, No murmurs, No S3.   Abd: Soft, Nontender.  MS: Adequate ROM spine, hips, shoulders and knees.  Ext: No edema.   CNS: CN 2-12 intact, power tone and sensation normal throughout.   Skin: Intact, striae Psych: Good eye contact, normal affect.  Memory intact, not anxious or depressed appearing.    Impression & Recommendations:  Problem # 1:  HYPERTENSION (ICD-401.9) Assessment Deteriorated  The following medications were removed from the medication list:    Furosemide 20 Mg Tabs (Furosemide) ..... One tab by mouth two times a day Her updated medication list for this problem includes:    Maxzide-25 37.5-25 Mg Tabs (Triamterene-hctz) .Marland Kitchen... Take 1 tablet by mouth once a day  BP today: 160/100 Prior BP: 124/80 (07/07/2010)  Labs Reviewed: K+: 4.3 (08/02/2010) Creat: : 0.71 (08/02/2010)     Problem # 2:  ABNORMAL WEIGHT GAIN (ICD-783.1) Assessment: Deteriorated  Orders: Endocrinology Referral (Endocrine), i have a real concern that there is underlying pathology causing thisexcessive weight gain  Problem # 3:  ANEMIA (ICD-285.9) Assessment: Comment Only  Her updated medication list for this problem includes:    Ferrous Sulfate 325 (65 Fe) Mg Tabs (Ferrous sulfate) .Marland Kitchen... Take 1 daily  Hgb:  11.8 (06/25/2010)   Hct: 37.7 (06/25/2010)   Platelets: 428 (06/25/2010) RBC: 4.57 (06/25/2010)   RDW: 16.4 (06/25/2010)   WBC: 10.4 (06/25/2010) MCV: 82.5 (06/25/2010)   MCHC: 31.3 (06/25/2010) Ferritin: 4 (06/25/2010) Iron: 31 (06/25/2010)   TSH: 0.898 (06/25/2010)  Complete Medication List: 1)  Ferrous Sulfate 325 (65 Fe) Mg Tabs (Ferrous sulfate)  .... Take 1 daily 2)  Maxzide-25 37.5-25 Mg Tabs (Triamterene-hctz) .... Take 1 tablet by mouth once a day 3)  Multivitamin  .... One daily  Patient Instructions: 1)  Please schedule a follow-up appointment in 1 month. 2)  It is important that you exercise regularly at least 20 minutes 5 times a week. If you develop chest pain, have severe difficulty breathing, or feel very tired , stop exercising immediately and seek medical attention. 3)  You need to lose weight. Consider a lower calorie diet and regular exercise.  4)  You will start anew med for HTN which also has a diuretic. 5)  I am referring you to a endocrinologist to get help in establishing the cause of your weight  gain and getting you help  Prescriptions: MAXZIDE-25 37.5-25 MG TABS (TRIAMTERENE-HCTZ) Take 1 tablet by mouth once a day  #30 x 2   Entered and Authorized by:   Syliva Overman MD   Signed by:   Syliva Overman MD on 08/26/2010   Method used:   Electronically to        [facility]* (retail)       [address]       [place], [place]  [address]       Ph: [phone]       Fax: [phone]   RxID:   [id number]    Orders Added: 1)  Est. Patient Level IV [64403] 2)  Endocrinology Referral [Endocrine]

## 2010-11-09 NOTE — Letter (Signed)
Summary: GYNECOLOGIC CYTOLOGY REPORT  GYNECOLOGIC CYTOLOGY REPORT   Imported By: Lind Guest 07/07/2010 14:27:37  _____________________________________________________________________  External Attachment:    Type:   Image     Comment:   External Document

## 2010-11-09 NOTE — Letter (Signed)
Summary: Immunization/Shot Record  [facility]  [address]   [place], [place] [address]   Phone: [phone]  Fax: [phone]     Immunization Record for: Janet [name]  Vaccine 1 2 3 4 5 6 HepB Hepatitis B                    DTP Diphtheria, Tetanus, Pertussis                         HIB Haemophilus influenzae Type b                 XXXXXXXXXX IPV Inactivated Poliovirus             MMR Measles, Mumps, Rubella    XXXXXXXXXX XXXXXXXXXX XXXXXXXXXX Varicella Varivax    XXXXXXXXXX XXXXXXXXXX XXXXXXXXXX Pneumococcal           Hep A Hepatitis A   XXXXXXXXXX XXXXXXXXXX XXXXXXXXXX XXXXXXXXXX        Tetanus Booster Date of Last:    Flu Shot Date of Last: 06/23/2010 Pneumovax Date of Last:  Meningococcal Vaccine Given:       Other Vaccines HPV Vaccine/ Date of Last:    Vaccine/ Date of Last:    Vaccine/ Date of Last:     XXXXXXXXXX  XXXXXXXXXX  XXXXXXXXXX Rotavirus Vaccine/ Date of Last:    Vaccine/ Date of Last:    Vaccine/ Date of Last:     XXXXXXXXXX  XXXXXXXXXX  XXXXXXXXXX Zostavax Vaccine/ Date of Last:     XXXXXXXXXX  XXXXXXXXXX  XXXXXXXXXX  XXXXXXXXXX  XXXXXXXXXX  Recommended Childhood and Adolescent Immunization Schedule [place]  2006 Vaccine Age Birth 1 mos 2 mos 4 mos 6 mos 12 mos 15 mos 18 mos 24 mos 4-6 yrs 11-12 yrs 13-14 yrs 15 yrs 16-18 yrs Hepatitis B1 HepB HepB HepB1  HepB  HepB Series Catch-Up Diphtheria, Tetanus, Pertussis2   DTaP DTaP DTaP   DTaP  DTaP Tdap  Tdap Catch-Up Haemophilus influenzae type b3   Hib Hib Hib3 Hib        Inactivated Poliovirus   IPV IPV  IPV   IPV     Measles, Mumps, Rubella4      MMR   MMR M MR MMR Catch-Up Varicella5       Varicella  Varicella  Catch-Up Meningo-coccal6           MCV4  MCV4 CatchUpV4           MPSV4 for High Risk Groups  C MCV4 for High Risk Groups Pneumo-coccal7   PCV PCV PCV PCV   PCV  Catch-Up PPV for High Risk Groups         PPV for High Risk Groups  Influenza8      Influenza (Yearly)  Influenza (Yearly) for High Risk Groups Hepatitis A9       HepA Series  This schedule indicates the recommended ages for routine administration of currently licensed childhood vaccines, as of September 09, 2004, for children through age 18 years. Any dose not administered at the recommended age should be administered at any subsequent visit when indicated and feasible. Indicates age groups that warrant special effort to administer those vaccines not previously administered. Additional vaccines may be licensed and recommended during the year. Licensed combination vaccines may be used whenever any components of the combination are indicated and other components of the vaccine are not contraindicated and if approved by the Food and Drug   Administration for that dose of the series. Providers should consult the respective ACIP statement for detailed recommendations. Clinically significant adverse events that follow immunization should be reported to the Vaccine Adverse Event Reporting System (VAERS). Guidance about how to obtain and complete a VAERS form is available at www.vaers.hhs.gov or by telephone, 800-822-7967.  The Childhood and Adolescent Immunization Schedule is approved by: Advisory Committee on Immunization Practices www.cdc.gov/nip/acip   American Academy of Pediatrics www.aap.org   American Academy of Family Physicians www.aafp.org     

## 2010-11-09 NOTE — Letter (Signed)
Summary: physical examination  physical examination   Imported By: Lind Guest 08/04/2010 13:44:28  _____________________________________________________________________  External Attachment:    Type:   Image     Comment:   External Document

## 2010-11-09 NOTE — Progress Notes (Signed)
Summary: lab results  Phone Note Call from Patient   Summary of Call: wants results on her lab work  call back at [phone] Initial call taken by: Lind Guest,  August 04, 2010 2:25 PM  Follow-up for Phone Call        pt called again about lab results would like a call back before she starts her potassium pills.  [phone] Follow-up by: Rosine Beat,  August 05, 2010 1:45 PM  Additional Follow-up for Phone Call Additional follow up Details #1::        let pt know potassium was normal, keep doing what she has in terms of the potassium, if she needs a refill , pls give one only, I will need to see her befor further refills Additional Follow-up by: Syliva Overman MD,  August 05, 2010 5:36 PM    Additional Follow-up for Phone Call Additional follow up Details #2::    returned call, no answer Follow-up by: Adella Hare LPN,  August 06, 2010 3:02 PM  Additional Follow-up for Phone Call Additional follow up Details #3:: Details for Additional Follow-up Action Taken: patient aware Additional Follow-up by: Adella Hare LPN,  August 10, 2010 9:18 AM  let

## 2010-11-11 NOTE — Letter (Signed)
Summary: Endocrinology/[facility]  endocrinology   Imported By: Lind Guest 09/27/2010 09:20:01  _____________________________________________________________________  External Attachment:    Type:   Image     Comment:   External Document

## 2010-11-11 NOTE — Letter (Signed)
Summary: INPATIENT CONSULT  INPATIENT CONSULT   Imported By: Lind Guest 11/03/2010 14:51:44  _____________________________________________________________________  External Attachment:    Type:   Image     Comment:   External Document

## 2010-11-11 NOTE — Letter (Signed)
[facility]   Imported By: Lind Guest 11/01/2010 08:39:21  _____________________________________________________________________  External Attachment:    Type:   Image     Comment:   External Document

## 2010-11-11 NOTE — Assessment & Plan Note (Signed)
Summary: BP ELEV / PER SARAH ADD ON/CD   Vital Signs:  Patient profile:   25 year old female Menstrual status:  regular Height:      63 inches (160.02 cm) Weight:      217 pounds (98.64 kg) BMI:     38.58 O2 Sat:      99 % on Room air Temp:     99.3 degrees F (37.39 degrees C) oral Pulse rate:   89 / minute Pulse rhythm:   irregular BP sitting:   132 / 82  (right arm) Cuff size:   large  Vitals Entered By: Brenton Grills CMA Duncan Dull) (September 14, 2010 9:24 AM)  O2 Flow:  Room air CC: Elevated BP, Swelling in both legs, muscle pain, back pain (pt had fall last week)/aj Is Patient Diabetic? No   CC:  Elevated BP, Swelling in both legs, muscle pain, and back pain (pt had fall last week)/aj.  History of Present Illness: pt states few days of slight worsening of her chronic leg swelling.  last week, she fell, striking her left knee.   she also reports leg cramps and headaches. pt says she found her diastolic bp to be 111.  Current Medications (verified): 1)  Ferrous Sulfate 325 (65 Fe) Mg Tabs (Ferrous Sulfate) .... Take 1 Daily 2)  Maxzide-25 37.5-25 Mg Tabs (Triamterene-Hctz) .... Take 1 Tablet By Mouth Once A Day 3)  Multivitamin .... One Daily 4)  Dexamethasone 1 Mg Tabs (Dexamethasone) .... Take At 9-10 Pm, The Night Before Blood Test  Allergies (verified): 1)  ! Pcn  Past History:  Past Medical History: HYPERCORTISOLISM (ICD-255.0) HYPERTENSION (ICD-401.9) ABNORMAL WEIGHT GAIN (ICD-783.1) SINUS ARRHYTHMIA (ICD-427.9) OBESITY (ICD-278.00) CHEST PAIN, ATYPICAL (ICD-786.59) ANEMIA (ICD-285.9) PERIPHERAL EDEMA (ICD-782.3)  Review of Systems  The patient denies syncope.         denies palpitations, but she has excessive sweating  Physical Exam  General:  morbidly obese.  no distress  Msk:  left knee: healing abrasion Extremities:  trace right pedal edema and trace left pedal edema.     Impression & Recommendations:  Problem # 1:  leg cramps and other  sxs.  it is unclear which, if any of these are cortisol-related  Problem # 2:  HYPERTENSION (ICD-401.9) rx limited by lability  Problem # 3:  edema very mild  Other Orders: T-Urine 24 Hr. Metanephrines [phone]) T-Urine 24 Hr. Catecholamines [phone]) Endocrinology Referral (Endocrine) Est. Patient Level IV (30865)  Patient Instructions: 1)  please see dr Lodema Hong for your health-care needs.   2)  continue to monitor your blood pressure.   3)  check 24-hr urine for "adrenaline."  if these tests are normal, no hormonal cause of your symptoms can be found.   4)  please see dr Lodema Hong as scheduled next week 5)  (update: i left message on phone-tree:  cortisol is high.  i'll leave you another message with 24-hr urine results)   Orders Added: 1)  T-Urine 24 Hr. Metanephrines [[id number]] 2)  T-Urine 24 Hr. Catecholamines [phone] 3)  Endocrinology Referral [Endocrine] 4)  Est. Patient Level IV [[address]]

## 2010-11-11 NOTE — Letter (Signed)
Summary: [facility] CONSULT  [facility] CONSULT   Imported By: Lind Guest 11/04/2010 11:44:11  _____________________________________________________________________  External Attachment:    Type:   Image     Comment:   External Document

## 2010-11-11 NOTE — Consult Note (Signed)
Summary: Endocrinology/[facility]  Endocrinology/[facility]   Imported By: Sherian Rein 09/29/2010 08:38:23  _____________________________________________________________________  External Attachment:    Type:   Image     Comment:   External Document

## 2010-11-11 NOTE — Letter (Signed)
Summary: [facility] / MEDICINE ENDOCRINOLOGY  [facility] / MEDICINE ENDOCRINOLOGY   Imported By: Lind Guest 11/01/2010 14:48:38  _____________________________________________________________________  External Attachment:    Type:   Image     Comment:   External Document

## 2010-11-11 NOTE — Letter (Signed)
Summary: Endocrinology/[facility]  ENDOCRINOLOGY   Imported By: Lind Guest 10/05/2010 10:00:51  _____________________________________________________________________  External Attachment:    Type:   Image     Comment:   External Document

## 2010-11-17 NOTE — Letter (Signed)
Summary: [facility]   Imported By: Lind Guest 11/09/2010 13:16:29  _____________________________________________________________________  External Attachment:    Type:   Image     Comment:   External Document

## 2010-11-30 ENCOUNTER — Encounter: Payer: Self-pay | Admitting: Endocrinology

## 2010-12-04 ENCOUNTER — Encounter: Payer: Self-pay | Admitting: Family Medicine

## 2010-12-16 NOTE — Letter (Signed)
Summary: Endocrin/[facility]  Endocrin/[facility]   Imported By: Lester [place] 12/07/2010 08:05:05  _____________________________________________________________________  External Attachment:    Type:   Image     Comment:   External Document

## 2010-12-16 NOTE — Letter (Signed)
Summary: [facility]   Imported By: Lind Guest 12/06/2010 09:16:46  _____________________________________________________________________  External Attachment:    Type:   Image     Comment:   External Document

## 2010-12-19 ENCOUNTER — Encounter: Payer: Self-pay | Admitting: Family Medicine

## 2010-12-23 LAB — DIFFERENTIAL
Basophils Absolute: 0 10*3/uL (ref 0.0–0.1)
Basophils Relative: 0 % (ref 0–1)
Eosinophils Absolute: 0.1 10*3/uL (ref 0.0–0.7)
Lymphs Abs: 1.9 10*3/uL (ref 0.7–4.0)
Monocytes Relative: 8 % (ref 3–12)
Neutro Abs: 9.4 10*3/uL — ABNORMAL HIGH (ref 1.7–7.7)
Neutrophils Relative %: 76 % (ref 43–77)

## 2010-12-23 LAB — CBC
Hemoglobin: 11.2 g/dL — ABNORMAL LOW (ref 12.0–15.0)
MCH: 26.8 pg (ref 26.0–34.0)
Platelets: 391 10*3/uL (ref 150–400)
RBC: 4.19 MIL/uL (ref 3.87–5.11)
RDW: 15.7 % — ABNORMAL HIGH (ref 11.5–15.5)

## 2010-12-23 LAB — BASIC METABOLIC PANEL
CO2: 25 mEq/L (ref 19–32)
Calcium: 8.5 mg/dL (ref 8.4–10.5)
Creatinine, Ser: 0.77 mg/dL (ref 0.4–1.2)
GFR calc Af Amer: >60 mL/min (ref >60)
GFR calc non Af Amer: >60 mL/min (ref >60)
Sodium: 137 mEq/L (ref 135–145)

## 2010-12-28 NOTE — Letter (Signed)
[facility]   Imported By: Lind Guest 12/21/2010 08:58:46  _____________________________________________________________________  External Attachment:    Type:   Image     Comment:   External Document

## 2011-01-11 ENCOUNTER — Ambulatory Visit (INDEPENDENT_AMBULATORY_CARE_PROVIDER_SITE_OTHER): Payer: BC Managed Care – PPO | Admitting: Family Medicine

## 2011-01-11 ENCOUNTER — Encounter: Payer: Self-pay | Admitting: Family Medicine

## 2011-01-11 ENCOUNTER — Telehealth: Payer: Self-pay | Admitting: Family Medicine

## 2011-01-11 DIAGNOSIS — N3090 Cystitis, unspecified without hematuria: Secondary | ICD-10-CM

## 2011-01-11 DIAGNOSIS — R5381 Other malaise: Secondary | ICD-10-CM

## 2011-01-11 DIAGNOSIS — R5383 Other fatigue: Secondary | ICD-10-CM

## 2011-01-11 DIAGNOSIS — R7301 Impaired fasting glucose: Secondary | ICD-10-CM

## 2011-01-11 DIAGNOSIS — I889 Nonspecific lymphadenitis, unspecified: Secondary | ICD-10-CM

## 2011-01-11 DIAGNOSIS — R0789 Other chest pain: Secondary | ICD-10-CM

## 2011-01-11 DIAGNOSIS — R079 Chest pain, unspecified: Secondary | ICD-10-CM

## 2011-01-11 LAB — CBC WITH DIFFERENTIAL/PLATELET
Basophils Absolute: 0 10*3/uL (ref 0.0–0.1)
Basophils Relative: 0 % (ref 0–1)
Eosinophils Absolute: 0.1 10*3/uL (ref 0.0–0.7)
Eosinophils Relative: 1 % (ref 0–5)
HCT: 35.7 % — ABNORMAL LOW (ref 36.0–46.0)
Hemoglobin: 11 g/dL — ABNORMAL LOW (ref 12.0–15.0)
Lymphocytes Relative: 23 % (ref 12–46)
MCHC: 30.8 g/dL (ref 30.0–36.0)
MCV: 76 fL — ABNORMAL LOW (ref 78.0–100.0)
Monocytes Absolute: 1.2 10*3/uL — ABNORMAL HIGH (ref 0.1–1.0)
Neutrophils Relative %: 67 % (ref 43–77)
Platelets: 449 10*3/uL — ABNORMAL HIGH (ref 150–400)
RDW: 16.1 % — ABNORMAL HIGH (ref 11.5–15.5)
WBC: 12.5 10*3/uL — ABNORMAL HIGH (ref 4.0–10.5)

## 2011-01-11 LAB — BASIC METABOLIC PANEL
Calcium: 8.8 mg/dL (ref 8.4–10.5)
Chloride: 104 mEq/L (ref 96–112)
Glucose, Bld: 77 mg/dL (ref 70–99)
Potassium: 3.9 mEq/L (ref 3.5–5.3)
Sodium: 139 mEq/L (ref 135–145)

## 2011-01-11 LAB — POCT URINALYSIS DIPSTICK
Nitrite, UA: NEGATIVE
Spec Grav, UA: 1.015
Urobilinogen, UA: 0.02
pH, UA: 6

## 2011-01-11 MED ORDER — CIPROFLOXACIN HCL 500 MG PO TABS: 500.0000 mg | ORAL_TABLET | Freq: Two times a day (BID) | ORAL | Status: AC

## 2011-01-11 NOTE — Telephone Encounter (Signed)
Sore throat, body aching, chills, fever, sweating, started yesterday. Also having UTI symptoms since she had catheter put in during surgery. Advised NV for U/A. Luann to schedule

## 2011-01-11 NOTE — Progress Notes (Signed)
  Subjective:    Patient ID: Janet [name], female    DOB: 07-18-1986, 25 y.o.   MRN: [id number]  HPI Sore throat, generalised  Body aches, chills and undocumented fever since yesterday, no known sick contact, has also had urinary frequency since 3/27 was told she would have uti's on a recurrent basis while on steroids following adrnal surgery for cushings . She reports anterior chest pain worse with direct pressure, non radiating, no associated diaphoresis, nausea, lightheadedness . Denies palpitations, pND or orthopnea.Reports resolution of hTN following adrenal surgery.    Review of Systems  Constitutional: Positive for fever, chills, fatigue and unexpected weight change. Negative for activity change and appetite change.  HENT: Positive for sore throat and postnasal drip. Negative for hearing loss, ear pain, congestion, rhinorrhea, sneezing, trouble swallowing, neck pain, neck stiffness and sinus pressure.   Eyes: Negative for photophobia, pain, discharge, redness, itching and visual disturbance.  Respiratory: Negative for cough, chest tightness, shortness of breath and wheezing.   Cardiovascular: Positive for chest pain. Negative for palpitations and leg swelling.  Gastrointestinal: Negative for nausea, vomiting, abdominal pain, diarrhea, constipation and blood in stool.  Genitourinary: Positive for frequency. Negative for dysuria, hematuria and flank pain.  Musculoskeletal: Positive for back pain and arthralgias. Negative for myalgias, joint swelling and gait problem.  Skin: Negative for rash and wound.  Neurological: Negative for dizziness, tremors, seizures, speech difficulty, weakness, numbness and headaches.  Hematological: Negative for adenopathy. Does not bruise/bleed easily.  Psychiatric/Behavioral: Negative for suicidal ideas, hallucinations, behavioral problems, confusion, sleep disturbance and decreased concentration. The patient is not nervous/anxious and is not hyperactive.         Objective:   Physical Exam  Nursing note and vitals reviewed. Constitutional: She is oriented to person, place, and time. She appears well-developed and well-nourished.  HENT:  Head: Normocephalic.  Right Ear: External ear normal.  Left Ear: External ear normal.  Mouth/Throat: No oropharyngeal exudate.  Eyes: Conjunctivae and EOM are normal. Right eye exhibits no discharge. Left eye exhibits no discharge. No scleral icterus.  Neck: Normal range of motion. Neck supple. No JVD present. No tracheal deviation present. No thyromegaly present.  Cardiovascular: Normal rate, regular rhythm, normal heart sounds and intact distal pulses.   No murmur heard. Pulmonary/Chest: Effort normal and breath sounds normal. No stridor. No respiratory distress. She has no wheezes. She has no rales. She exhibits tenderness.  Abdominal: Soft. Bowel sounds are normal. There is no tenderness. There is no rebound and no guarding.  Musculoskeletal: Normal range of motion. She exhibits no edema.  Lymphadenopathy:    She has cervical adenopathy.  Neurological: She is alert and oriented to person, place, and time. No cranial nerve deficit. Coordination normal.  Skin: Skin is warm and dry. No rash noted. No erythema.  Psychiatric: She has a normal mood and affect. Her behavior is normal. Judgment and thought content normal.          Assessment & Plan:  1.chest pain: chest wall diffusely  tender  ,normal EKG,non cardiac pain, pt reassured . 2.cystitis: from uA likely bacterial infection, cipro prescribe presumptively. 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

## 2011-01-11 NOTE — Patient Instructions (Signed)
You are being treated  For pharyngitis and cervical adenitis.You are being tested forUTI.  Your EKG is normal.   CBC  Today.andchem 7  HBA1C end April, non fasting F/U in May/June

## 2011-01-13 LAB — URINE CULTURE: Colony Count: >=100000

## 2011-01-15 ENCOUNTER — Encounter: Payer: Self-pay | Admitting: Family Medicine

## 2011-02-14 ENCOUNTER — Other Ambulatory Visit: Payer: Self-pay | Admitting: Obstetrics & Gynecology

## 2011-02-14 ENCOUNTER — Other Ambulatory Visit (HOSPITAL_COMMUNITY)
Admission: RE | Admit: 2011-02-14 | Discharge: 2011-02-14 | Disposition: A | Discharge status: Home / Self Care | Payer: BC Managed Care – PPO | Source: Ambulatory Visit | Attending: Obstetrics & Gynecology | Admitting: Obstetrics & Gynecology

## 2011-02-14 DIAGNOSIS — Z01419 Encounter for gynecological examination (general) (routine) without abnormal findings: Secondary | ICD-10-CM | POA: Insufficient documentation

## 2011-02-25 NOTE — Letter (Signed)
October 13, 2006    Janet [name], M.D.  [address] [place], [place] [address]   RE:  Janet, [name]  MRN:  [id number]  /  DOB:  21-Mar-1986   Dear Janet [name]:   I greatly appreciate the referral of [name] for cardiology  consultation.  As you know, this nice young woman has had issues with  chest pain over recent months.  She describes a moderately severe sharp  intermittent left upper chest discomfort that is unrelated to exertion  or movement of the trunk or movement of the arms.  There is no pleuritic  component.  She has no associated dyspnea nor nausea but does have a  feeling of warmth when symptoms occur.  She typically experiences  episodes approximately once or twice per week.  These may last minutes  to hours.  The discomfort does not really impair her ability to do what  she needs to do, but she does find it somewhat aversive and worrisome.  She was also noted in your office today to have an irregular heart  rhythm prompting this evaluation.   Janet [name] has enjoyed generally good health.  She has never been  previously seen by a cardiologist nor had any cardiac testing.  She has  only been hospitalized on one occasion at which time she had an elective  tonsillectomy.  She has no chronic medical problems.   SHE IS ALLERGIC TO PENICILLIN.   She takes no medications routinely.   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  She has a relatively sedentary lifestyle but notes  no exertional symptoms.  She is unmarried and has never been pregnant.   FAMILY HISTORY:  Notable for hypertension in her mother and father.  Not  prominent for coronary disease.   REVIEW OF SYSTEMS:  Notable for intermittent headaches, the need for  glasses for reading, a history of murmur, occasional mild palpitations.  All other systems reviewed and are negative.   PHYSICAL EXAMINATION:  GENERAL:  A pleasant young woman in no acute  distress.  VITAL SIGNS:  The weight is 150 pounds.  Blood pressure 105/80, heart  rate 65 with marked variation in rate.  HEENT:  Anicteric sclerae; normal lids and conjunctivae.  NECK:  No jugular venous distention; normal carotid upstrokes without  bruits.  ENDOCRINE:  No thyromegaly.  HEMATOPOIETIC:  No adenopathy.  LUNGS:  Clear.  CARDIAC:  Split first heart sound; normal second heart sound.  ABDOMEN:  Soft and nontender; no organomegaly.  EXTREMITIES:  No edema; normal distal pulses.  NEUROMUSCULAR:  Symmetric strength and tone; normal cranial nerves.   EKG:  Sinus rhythm with marked sinus arrhythmia; otherwise unremarkable.   LABORATORY:  Provided by your office includes a normal chemistry  profile, an excellent lipid profile, a normal TSH and a normal CBC.   IMPRESSION:  1. Janet [name] has a sinus arrhythmia related to her young age.  With      exercise in the office, her heart rate increased and regularized.      We monitored her for 10 minutes and saw no other arrhythmia.  This      is benign and of no concern.  2. Her chest discomfort is nonspecific.  The likelihood of coronary      disease is virtually nil other than a congenital abnormality.  In      the absence of exercise-induced symptoms, stress  testing is not      warranted.  We discussed the possibility that her symptoms      represent anxiety.  She does not see herself as a particularly      anxious person nor does she experience significant stress with her      current lifestyle.  While I think this is very possibly the correct      etiology, I have elected to treat her empirically with omeprazole      for the possibility      that her symptoms are gastrointestinal in origin.  She will take 20      mg per day initially and increase to 20 mg twice a day if      ineffective.  I will reassess her symptoms in one month.   Thanks for sending this nice woman to me.    Sincerely,      Janet Friends. Dietrich Pates, MD,  [facility]  Electronically Signed    RMR/MedQ  DD: 10/13/2006  DT: 10/13/2006  Job #: [id number]

## 2011-02-25 NOTE — Letter (Signed)
November 17, 2006    Tilda Burrow, M.D.  [address] [place], [place] [address]   RE:  MACKENA, PLUMMER  MRN:  [id number]  /  DOB:  01-02-1986   Dear Jonny Ruiz:   Ms. Pinkus returns to the office for continued assessment and treatment  of chest discomfort.  Neither low dose nor high dose Prilosec provided  her with any relief of her symptoms.  She has paid more attention to the  pattern of occurrence.  She has had some associated abdominal discomfort  and nausea at times.  There has been no disturbance in p.o. intake.  She  has no prominent diarrhea nor constipation.  She notes some association  with stress.  She reports only 6 hours of sleep per night due to  difficulty falling asleep if she goes to bed before midnight.   EXAMINATION:  The weight is 152, 2 pounds more than last month.  Blood  pressure 100/70, heart rate 70 and regular, respirations 16.  NECK:  No jugular venous distention; normal carotid upstrokes without  bruits.  LUNGS:  Clear.  CARDIAC:  Normal first and second heart sounds; modest systolic ejection  murmur.  ABDOMEN:  Soft and nontender; no masses; no organomegaly.  EXTREMITIES:  No edema.   IMPRESSION:  Ms. Firebaugh now appears to have a more prominent component of  anxiety, which she denied at our initial visit.  She will discontinue  Prilosec.  We will try very low dose Ativan  0.25-0.5 mg p.r.n. b.i.d. and for sleep.  I have asked her to return to  Dr. Dimas Aguas for further followup and assessment of these issues.  Please  let me know at any time that I can provide additional assistance in the  care of this nice young woman.    Sincerely,      Gerrit Friends. Dietrich Pates, MD, [facility]  Electronically Signed    RMR/MedQ  DD: 11/17/2006  DT: 11/17/2006  Job #: [id number]   CC:    Selinda Flavin

## 2011-04-12 ENCOUNTER — Ambulatory Visit (INDEPENDENT_AMBULATORY_CARE_PROVIDER_SITE_OTHER): Payer: BC Managed Care – PPO | Admitting: Family Medicine

## 2011-04-12 ENCOUNTER — Encounter: Payer: Self-pay | Admitting: Family Medicine

## 2011-04-12 ENCOUNTER — Encounter: Payer: Self-pay | Admitting: *Deleted

## 2011-04-12 DIAGNOSIS — I10 Essential (primary) hypertension: Secondary | ICD-10-CM

## 2011-04-12 DIAGNOSIS — E669 Obesity, unspecified: Secondary | ICD-10-CM

## 2011-04-12 DIAGNOSIS — E249 Cushing's syndrome, unspecified: Secondary | ICD-10-CM

## 2011-04-12 DIAGNOSIS — R112 Nausea with vomiting, unspecified: Secondary | ICD-10-CM

## 2011-04-12 NOTE — Progress Notes (Signed)
  Subjective:    Patient ID: Janet [name], female    DOB: 02-Nov-1985, 25 y.o.   MRN: [id number]  HPI Pt in today stating that approx 2 hrs ago on her job, she was told her bP was high. This happened before and she has been told by her endocrinologist this is from her Cushing's when unable to keep on schedule with her steroids. Reports increased stress in the past 2 weeks, has nursing exam in 1 week, and afluctuatng work schedule .  States she had nausea, vomiting and diarreah for the past 3 day, vomitted twice  Yesterday, has zofran at home for as needed use. Feel overwhelmed, and is extremely anxious about her upcoming exam , which she has been unsucesful in passing recently. Reports inadequate rest with difficulty concentrating. Has specific questions as to whehter she should resume her BP med , i advised against this since her BP is normal, and the info from endocrine suggests that the BP issue was really a pat tof her endocrinological disorder which  Has been surgically addressed Review of Systems Denies recent fever or chills. Denies sinus pressure, nasal congestion, ear pain or sore throat. Denies chest congestion, productive cough or wheezing. Denies chest pains, palpitations, paroxysmal nocturnal dyspnea, orthopnea and leg swelling  Denies dysuria, frequency, hesitancy or incontinence. Denies joint pain, swelling and limitation in mobility.  Denies skin break down or rash.        Objective:   Physical Exam Patient alert and oriented and in no Cardiopulmonary distress.  HEENT: No facial asymmetry, EOMI, no sinus tenderness, TM's clear, Oropharynx pink and moist.  Neck supple no adenopathy.  Chest: Clear to auscultation bilaterally.  CVS: S1, S2 no murmurs, no S3.  ABD: Soft non tender. Bowel sounds normal.  Ext: No edema  MS: Adequate ROM spine, shoulders, hips and knees.  Skin: Intact, no ulcerations or rash noted.  Psych: Good eye contact,  anxious and tearful  at  Times.states she called her endocrinologist and it was suggested her problem was essentially mental  CNS: CN 2-12 intact, power, tone and sensation normal throughout.       Assessment & Plan:

## 2011-04-12 NOTE — Patient Instructions (Signed)
F/u in 4 months.  Need  To discuss with your endocrinologist  Finding  A local endocrinologist at your next visit.  Pls do not start taking blood pressure medication regularly, your bP is normal no.  Use med for nausea when needed and take the hydrocort as prescribed on regular schedule.  Pls get rest , if needed benadryl 1 tablet at bedtime will help with this.'  Work excuse from 7/4 to 7/7 /2012

## 2011-04-14 DIAGNOSIS — R112 Nausea with vomiting, unspecified: Secondary | ICD-10-CM | POA: Insufficient documentation

## 2011-04-14 NOTE — Assessment & Plan Note (Signed)
Recent episodes likely aggravated by stress with exams and a full work load, med prescribed, stress management discussed, and work excuse given

## 2011-04-14 NOTE — Assessment & Plan Note (Addendum)
Surgically corrected, on tapering doses of steroids, currently ahving problems keeping med down which may be resulting in lability i blood prerssure. Pt to keep appt with endo at [facility], and check into finding a more local doc to follow her with this

## 2011-04-14 NOTE — Assessment & Plan Note (Signed)
Currently normotensive, advised against resumption of antihypertensive medication regulalrly

## 2011-04-14 NOTE — Assessment & Plan Note (Signed)
Improved with correction of adrenal disease

## 2011-06-10 ENCOUNTER — Ambulatory Visit (INDEPENDENT_AMBULATORY_CARE_PROVIDER_SITE_OTHER): Payer: BC Managed Care – PPO | Admitting: Family Medicine

## 2011-06-10 ENCOUNTER — Encounter: Payer: Self-pay | Admitting: Family Medicine

## 2011-06-10 VITALS — BP 130/90 | HR 74 | Temp 99.3°F | Resp 16 | Ht 64.0 in | Wt 200.4 lb

## 2011-06-10 DIAGNOSIS — R51 Headache: Secondary | ICD-10-CM

## 2011-06-10 DIAGNOSIS — E249 Cushing's syndrome, unspecified: Secondary | ICD-10-CM

## 2011-06-10 DIAGNOSIS — I10 Essential (primary) hypertension: Secondary | ICD-10-CM

## 2011-06-10 DIAGNOSIS — J069 Acute upper respiratory infection, unspecified: Secondary | ICD-10-CM

## 2011-06-10 DIAGNOSIS — E240 Pituitary-dependent Cushing's disease: Secondary | ICD-10-CM

## 2011-06-10 MED ORDER — AZITHROMYCIN 250 MG PO TABS: ORAL_TABLET | ORAL | Status: AC

## 2011-06-10 NOTE — Patient Instructions (Signed)
Take the Azithromycin  We will get an MRI scheduled  I will call you about the results from [facility]

## 2011-06-10 NOTE — Progress Notes (Signed)
  Subjective:    Patient ID: Janet [name], female    DOB: 01/09/1986, 25 y.o.   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   Hx has history  Cushings-- Having more swelling in her ankles, will see her endocrinologist next month in [facility]   Review of Systems   Per above       GEN- + fatigue, fever, weight loss,weakness,  HEENT- denies eye drainage, change in vision, nasal discharge, CVS- denies chest pain,+ palpitations RESP- denies SOB, +cough, deniesvwheeze ABD- denies N/V, change in stools, abd pain Neuro- + headache, dizziness, denies syncope, denies seizure activity        Objective:   Physical Exam  GEN- NAD, alert and oriented x3 HEENT- PERRL, EOMI, non injected sclera, pink conjunctiva, MMM, oropharynx clear, fundoscopic exam benign Neck- Supple, no LAD CVS- RRR, no murmur RESP-upper airway congestion radiated, no rhonchi, no wheeze, normal WOB when not coughing EXT- trace edema Pulses- Radial, DP- 2+ Neuro- CNII-XIII in tact, no focal deficits, motor equal bilat, sensation grossly in tact        Assessment & Plan:

## 2011-06-12 ENCOUNTER — Encounter: Payer: Self-pay | Admitting: Family Medicine

## 2011-06-12 DIAGNOSIS — J069 Acute upper respiratory infection, unspecified: Secondary | ICD-10-CM | POA: Insufficient documentation

## 2011-06-12 DIAGNOSIS — R51 Headache: Secondary | ICD-10-CM | POA: Insufficient documentation

## 2011-06-12 DIAGNOSIS — R519 Headache, unspecified: Secondary | ICD-10-CM | POA: Insufficient documentation

## 2011-06-12 NOTE — Assessment & Plan Note (Signed)
Likley viral URI, however with prolonged steroids she is immunocompromised, therefore will treat with Z pak.

## 2011-06-12 NOTE — Assessment & Plan Note (Signed)
Pt swelling not severe, BP being monitored. MRI for HA

## 2011-06-12 NOTE — Assessment & Plan Note (Signed)
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 No focal neurological deficits. Will obtain MRI of head.

## 2011-06-12 NOTE — Assessment & Plan Note (Signed)
BP elevated today, however not sustained, will not restart meds at this time

## 2011-06-17 ENCOUNTER — Ambulatory Visit (HOSPITAL_COMMUNITY)
Admission: RE | Admit: 2011-06-17 | Discharge: 2011-06-17 | Disposition: A | Discharge status: Home / Self Care | Payer: BC Managed Care – PPO | Source: Ambulatory Visit | Attending: Family Medicine | Admitting: Family Medicine

## 2011-06-17 DIAGNOSIS — R51 Headache: Secondary | ICD-10-CM | POA: Insufficient documentation

## 2011-06-17 DIAGNOSIS — E240 Pituitary-dependent Cushing's disease: Secondary | ICD-10-CM

## 2011-06-17 DIAGNOSIS — E249 Cushing's syndrome, unspecified: Secondary | ICD-10-CM | POA: Insufficient documentation

## 2011-06-17 MED ORDER — GADOBENATE DIMEGLUMINE 529 MG/ML IV SOLN
20.0000 mL | Freq: Once | INTRAVENOUS | Status: AC | PRN
  Administered 2011-06-17: 20 mL via INTRAVENOUS

## 2011-06-20 ENCOUNTER — Telehealth: Payer: Self-pay | Admitting: Family Medicine

## 2011-06-20 DIAGNOSIS — R51 Headache: Secondary | ICD-10-CM

## 2011-06-20 DIAGNOSIS — G935 Compression of brain: Secondary | ICD-10-CM

## 2011-06-20 NOTE — Telephone Encounter (Signed)
I spoke with patient she is aware of abnormal MRI results. I explained that she should see neurology she agrees. Will send a referral

## 2011-07-04 ENCOUNTER — Telehealth: Payer: Self-pay | Admitting: Family Medicine

## 2011-07-04 NOTE — Telephone Encounter (Signed)
Small finger of left hand, no memory of injury, states swollen, red, warm, purple and blue, painful

## 2011-07-04 NOTE — Telephone Encounter (Signed)
Called patient, left message.

## 2011-07-04 NOTE — Telephone Encounter (Signed)
Cell [phone]

## 2011-07-04 NOTE — Telephone Encounter (Signed)
Needs evaluation, I suggest urgent care this pm

## 2011-07-05 NOTE — Telephone Encounter (Signed)
Did not want to go to urgent care, too expensive. Wants to come in to see dr Jeanice Lim tomorrow. Luann to schedule

## 2011-07-06 ENCOUNTER — Ambulatory Visit: Payer: BC Managed Care – PPO | Admitting: Family Medicine

## 2011-07-20 ENCOUNTER — Emergency Department (HOSPITAL_COMMUNITY)
Admission: EM | Admit: 2011-07-20 | Discharge: 2011-07-20 | Disposition: A | Discharge status: Home / Self Care | Payer: BC Managed Care – PPO | Attending: Emergency Medicine | Admitting: Emergency Medicine

## 2011-07-20 ENCOUNTER — Emergency Department (HOSPITAL_COMMUNITY): Payer: BC Managed Care – PPO

## 2011-07-20 DIAGNOSIS — M940 Chondrocostal junction syndrome [Tietze]: Secondary | ICD-10-CM | POA: Insufficient documentation

## 2011-07-20 DIAGNOSIS — R079 Chest pain, unspecified: Secondary | ICD-10-CM | POA: Insufficient documentation

## 2011-07-20 DIAGNOSIS — R209 Unspecified disturbances of skin sensation: Secondary | ICD-10-CM | POA: Insufficient documentation

## 2011-08-22 ENCOUNTER — Encounter: Payer: Self-pay | Admitting: Family Medicine

## 2011-08-23 ENCOUNTER — Ambulatory Visit: Payer: BC Managed Care – PPO | Admitting: Family Medicine

## 2011-09-07 ENCOUNTER — Encounter: Payer: Self-pay | Admitting: Family Medicine

## 2011-09-13 ENCOUNTER — Ambulatory Visit: Payer: BC Managed Care – PPO | Admitting: Family Medicine

## 2011-09-21 ENCOUNTER — Telehealth: Payer: Self-pay

## 2011-09-21 NOTE — Telephone Encounter (Signed)
I agree with the advice if symptoms persist will need need urgent care eval since no available appts

## 2011-11-01 ENCOUNTER — Ambulatory Visit (INDEPENDENT_AMBULATORY_CARE_PROVIDER_SITE_OTHER): Payer: BC Managed Care – PPO

## 2011-11-01 VITALS — BP 138/80 | Wt 192.0 lb

## 2011-11-01 DIAGNOSIS — Z111 Encounter for screening for respiratory tuberculosis: Secondary | ICD-10-CM

## 2011-11-03 LAB — TB SKIN TEST
Induration: 0
TB Skin Test: NEGATIVE mm

## 2011-12-09 ENCOUNTER — Encounter: Payer: Self-pay | Admitting: Family Medicine

## 2011-12-09 ENCOUNTER — Ambulatory Visit (INDEPENDENT_AMBULATORY_CARE_PROVIDER_SITE_OTHER): Payer: BC Managed Care – PPO | Admitting: Family Medicine

## 2011-12-09 ENCOUNTER — Encounter (HOSPITAL_COMMUNITY): Payer: Self-pay | Admitting: *Deleted

## 2011-12-09 ENCOUNTER — Emergency Department (HOSPITAL_COMMUNITY)
Admission: EM | Admit: 2011-12-09 | Discharge: 2011-12-09 | Disposition: A | Discharge status: Home / Self Care | Payer: BC Managed Care – PPO | Attending: Emergency Medicine | Admitting: Emergency Medicine

## 2011-12-09 ENCOUNTER — Ambulatory Visit (HOSPITAL_COMMUNITY)
Admission: RE | Admit: 2011-12-09 | Discharge: 2011-12-09 | Disposition: A | Discharge status: Home / Self Care | Payer: BC Managed Care – PPO | Source: Ambulatory Visit | Attending: Family Medicine | Admitting: Family Medicine

## 2011-12-09 VITALS — BP 116/76 | HR 88 | Resp 18 | Ht 64.0 in | Wt 192.1 lb

## 2011-12-09 DIAGNOSIS — R630 Anorexia: Secondary | ICD-10-CM | POA: Insufficient documentation

## 2011-12-09 DIAGNOSIS — R109 Unspecified abdominal pain: Secondary | ICD-10-CM | POA: Insufficient documentation

## 2011-12-09 DIAGNOSIS — R112 Nausea with vomiting, unspecified: Secondary | ICD-10-CM | POA: Insufficient documentation

## 2011-12-09 DIAGNOSIS — I10 Essential (primary) hypertension: Secondary | ICD-10-CM

## 2011-12-09 DIAGNOSIS — R1031 Right lower quadrant pain: Secondary | ICD-10-CM | POA: Insufficient documentation

## 2011-12-09 DIAGNOSIS — R599 Enlarged lymph nodes, unspecified: Secondary | ICD-10-CM | POA: Insufficient documentation

## 2011-12-09 DIAGNOSIS — E249 Cushing's syndrome, unspecified: Secondary | ICD-10-CM | POA: Insufficient documentation

## 2011-12-09 DIAGNOSIS — R935 Abnormal findings on diagnostic imaging of other abdominal regions, including retroperitoneum: Secondary | ICD-10-CM | POA: Insufficient documentation

## 2011-12-09 DIAGNOSIS — R319 Hematuria, unspecified: Secondary | ICD-10-CM

## 2011-12-09 DIAGNOSIS — R0789 Other chest pain: Secondary | ICD-10-CM

## 2011-12-09 DIAGNOSIS — R5381 Other malaise: Secondary | ICD-10-CM

## 2011-12-09 DIAGNOSIS — R5383 Other fatigue: Secondary | ICD-10-CM

## 2011-12-09 DIAGNOSIS — Z79899 Other long term (current) drug therapy: Secondary | ICD-10-CM | POA: Insufficient documentation

## 2011-12-09 LAB — COMPREHENSIVE METABOLIC PANEL
ALT: 7 U/L (ref 0–35)
Albumin: 3.5 g/dL (ref 3.5–5.2)
BUN: 7 mg/dL (ref 6–23)
Creatinine, Ser: 0.63 mg/dL (ref 0.50–1.10)
Total Protein: 7.1 g/dL (ref 6.0–8.3)

## 2011-12-09 LAB — BASIC METABOLIC PANEL
BUN: 9 mg/dL (ref 6–23)
CO2: 22 mEq/L (ref 19–32)
Creat: 0.65 mg/dL (ref 0.50–1.10)
Glucose, Bld: 82 mg/dL (ref 70–99)
Potassium: 3.7 mEq/L (ref 3.5–5.3)
Sodium: 138 mEq/L (ref 135–145)

## 2011-12-09 LAB — URINALYSIS, ROUTINE W REFLEX MICROSCOPIC
Bilirubin Urine: NEGATIVE
Glucose, UA: NEGATIVE mg/dL
Leukocytes, UA: NEGATIVE
Specific Gravity, Urine: 1.01 (ref 1.005–1.030)
pH: 6 (ref 5.0–8.0)

## 2011-12-09 LAB — CBC WITH DIFFERENTIAL/PLATELET
Basophils Relative: 0 % (ref 0–1)
HCT: 37.6 % (ref 36.0–46.0)
Hemoglobin: 12.3 g/dL (ref 12.0–15.0)
Lymphs Abs: 2.3 10*3/uL (ref 0.7–4.0)
MCH: 25.9 pg — ABNORMAL LOW (ref 26.0–34.0)
MCHC: 32.7 g/dL (ref 30.0–36.0)
Monocytes Absolute: 0.7 10*3/uL (ref 0.1–1.0)
Monocytes Relative: 12 % (ref 3–12)
Neutro Abs: 2.6 10*3/uL (ref 1.7–7.7)
Neutrophils Relative %: 45 % (ref 43–77)
Platelets: 376 10*3/uL (ref 150–400)
RBC: 4.75 MIL/uL (ref 3.87–5.11)
WBC: 5.7 10*3/uL (ref 4.0–10.5)

## 2011-12-09 LAB — LIPASE, BLOOD: Lipase: 43 U/L (ref 11–59)

## 2011-12-09 LAB — POCT PREGNANCY, URINE: Preg Test, Ur: NEGATIVE

## 2011-12-09 LAB — POCT URINALYSIS DIPSTICK
Bilirubin, UA: NEGATIVE
Glucose, UA: NEGATIVE
Ketones, UA: NEGATIVE
Spec Grav, UA: 1.025
Urobilinogen, UA: 0.2

## 2011-12-09 LAB — CBC
HCT: 36.1 % (ref 36.0–46.0)
Hemoglobin: 11.8 g/dL — ABNORMAL LOW (ref 12.0–15.0)
MCH: 25.7 pg — ABNORMAL LOW (ref 26.0–34.0)
MCHC: 32.7 g/dL (ref 30.0–36.0)
MCV: 78.6 fL (ref 78.0–100.0)
RDW: 14.6 % (ref 11.5–15.5)

## 2011-12-09 LAB — DIFFERENTIAL
Basophils Relative: 0 % (ref 0–1)
Eosinophils Absolute: 0.1 10*3/uL (ref 0.0–0.7)
Monocytes Absolute: 0.7 10*3/uL (ref 0.1–1.0)
Monocytes Relative: 9 % (ref 3–12)
Neutrophils Relative %: 54 % (ref 43–77)

## 2011-12-09 LAB — URINE MICROSCOPIC-ADD ON

## 2011-12-09 LAB — AMYLASE: Amylase: 52 U/L (ref 0–105)

## 2011-12-09 MED ORDER — HYDROCORTISONE SOD SUCCINATE 100 MG IJ SOLR
100.0000 mg | Freq: Once | INTRAMUSCULAR | Status: AC
  Administered 2011-12-09: 100 mg via INTRAVENOUS
  Filled 2011-12-09: qty 2

## 2011-12-09 MED ORDER — SODIUM CHLORIDE 0.9 % IV SOLN
1000.0000 mL | Freq: Once | INTRAVENOUS | Status: AC
  Administered 2011-12-09: 1000 mL via INTRAVENOUS

## 2011-12-09 MED ORDER — ONDANSETRON HCL 4 MG/2ML IJ SOLN
4.0000 mg | Freq: Once | INTRAMUSCULAR | Status: AC
  Administered 2011-12-09: 4 mg via INTRAVENOUS
  Filled 2011-12-09: qty 2

## 2011-12-09 MED ORDER — HYDROCODONE-ACETAMINOPHEN 5-325 MG PO TABS: 1.0000 | ORAL_TABLET | Freq: Four times a day (QID) | ORAL | Status: AC | PRN

## 2011-12-09 MED ORDER — ONDANSETRON 8 MG PO TBDP: 8.0000 mg | ORAL_TABLET | Freq: Three times a day (TID) | ORAL | Status: AC | PRN

## 2011-12-09 MED ORDER — SODIUM CHLORIDE 0.9 % IV SOLN: 1000.0000 mL | INTRAVENOUS | Status: DC

## 2011-12-09 MED ORDER — HYDROCORTISONE NICU INJ SYRINGE 50 MG/ML: 100.0000 mg | Freq: Once | INTRAVENOUS | Status: DC

## 2011-12-09 MED ORDER — IOHEXOL 300 MG/ML  SOLN
100.0000 mL | Freq: Once | INTRAMUSCULAR | Status: AC | PRN
  Administered 2011-12-09: 100 mL via INTRAVENOUS

## 2011-12-09 MED ORDER — DICYCLOMINE HCL 10 MG PO CAPS: ORAL_CAPSULE | ORAL | Status: DC

## 2011-12-09 NOTE — Patient Instructions (Addendum)
F/u in 4 weeks  You need to get a scan of your abdomen and pelvis, I am concerned that you may have acute appendicitis or acute cholecytitis.  You will get zofran 4mg  IM in the office for nausea.  You need labs done, at the hospital cbc and diff, chem 7, amylase and lipase.  Dicyclomine will be sent in for abdominal cramps and you will be given the BRAT diet.  You are unable to work before [id number]  I hope you feel better soon  Your EKG shows no evidence of heart damage  Urine is being sent to lab for further testing, you may need to see a urologist, since you report a h/o blood in the urine

## 2011-12-09 NOTE — ED Notes (Signed)
abd pain with N/V, Had ct as OP.then sent to ER

## 2011-12-09 NOTE — Progress Notes (Signed)
  Subjective:    Patient ID: Janet [name], female    DOB: September 11, 1986, 26 y.o.   MRN: [id number]  HPI 3 day h/o abdominal pain, mainly in both lower adnexa, and also in the RUQ which is the worst.c/o nausea and bloating, states she has eval for  Gall stones negative about 2 years ago. Denies urinary symptoms or vaginal d/c. Has had chills. C/o hard stool on avg every 3 days, which is painful, has to strain, gets headaches Left chest pain x 2 daysx 2 days, took steroid , since she was feeling badly, no improvement.Pain is worse with direct pressure, goes to her back, at times feels light headed. No sspecific aggravating factor for chest pain, tachycardia for the past 2 days, states shewas seen in the past for this in 2007   Review of Systems     Objective:   Physical Exam  Patient alert and oriented and in no cardiopulmonary distress.Ill appearing  HEENT: No facial asymmetry, EOMI, no sinus tenderness,  oropharynx pink and moist.  Neck supple no adenopathy.  Chest: Clear to auscultation bilaterally.No reproducible chest wall pain  CVS: S1, S2 no murmurs, no S3.  ABD: Soft RUQ and RLQ tenderness with rebound. No palpable organomegaly or mass Ext: No edema  MS: Adequate ROM spine, shoulders, hips and knees.  Skin: Intact, no ulcerations or rash noted.  Psych: Good eye contact, normal affect. Memory intact  anxious appearing.  CNS: CN 2-12 intact, power, tone and sensation normal throughout.       Assessment & Plan:

## 2011-12-09 NOTE — Assessment & Plan Note (Signed)
Acute on chronic symptom, if scans are negative pt should have a HIDa scan in the future, will also add H pylori to lab

## 2011-12-09 NOTE — Assessment & Plan Note (Signed)
Acute abdominal pain with significant nausea and rebound on exam, urgent scans to r/o acute abdomen

## 2011-12-09 NOTE — ED Provider Notes (Signed)
History     CSN: [id number]  Arrival date & time 12/09/11  1303   First MD Initiated Contact with Patient 12/09/11 1353      Chief Complaint  Patient presents with  . Abdominal Pain    (Consider location/radiation/quality/duration/timing/severity/associated sxs/prior treatment) Patient is a 26 y.o. female presenting with abdominal pain. The history is provided by the patient.  Abdominal Pain The primary symptoms of the illness include abdominal pain. The current episode started yesterday. The problem has been gradually worsening.  The abdominal pain is located in the RLQ. The abdominal pain does not radiate. The abdominal pain is relieved by nothing.  The patient states that she believes she is currently not pregnant. The patient has not had a change in bowel habit. Additional symptoms associated with the illness include anorexia. Symptoms associated with the illness do not include chills.   the patient has been having nausea and vomiting associated with the pain. She has not been able to keep anything down. 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  She takes steroid replacement. Patient was seen by her doctor in the office today and had laboratory tests as well as an outpatient abdominal pelvic CT. CT scan did not show evidence of appendicitis. There was no evidence of inflammation around the appendix. There was slight prominence of the tip of the appendix and her primary doctor was concerned about this finding and sent her to the emergency room.    Past Medical History  Diagnosis Date  . Cushing syndrome dx in 2011    Past Surgical History  Procedure Date  . 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 Tonsillectomy     Family History  Problem Relation Age of Onset  . Hypertension Mother   . Hyperlipidemia Mother   . Hypertension Father   . Hypertension Maternal Grandmother   . Diabetes Maternal Grandmother   . Hypertension Paternal Grandmother   . Diabetes Paternal  Grandmother   . COPD Paternal Grandfather   . Heart disease Paternal Grandfather     History  Substance Use Topics  . Smoking status: Never Smoker   . Smokeless tobacco: Not on file  . Alcohol Use: No    OB History    Grav Para Term Preterm Abortions TAB SAB Ect Mult Living                  Review of Systems  Constitutional: Negative for chills.  Gastrointestinal: Positive for abdominal pain and anorexia.  All other systems reviewed and are negative.    10 Systems reviewed and are negative for acute change except as noted in the HPI.   Allergies  Penicillins; Iodine; and Latex  Home Medications   Current Outpatient Rx  Name Route Sig Dispense Refill  . DICYCLOMINE HCL 10 MG PO CAPS  One capsule 3 times daily, as needed for abdominal pain and spasm 42 capsule 0  . ERGOCALCIFEROL 50000 UNITS PO CAPS Oral Take 50,000 Units by mouth once a week. Twice weekly     . HYDROCORTISONE 10 MG PO TABS Oral Take 10 mg by mouth as needed.       BP 132/77  Pulse 77  Temp(Src) 97.8 F (36.6 C) (Oral)  Resp 18  Ht 5\' 4"  (1.626 m)  Wt 192 lb (87.091 kg)  BMI 32.96 kg/m2  SpO2 100%  LMP 11/16/2011  Physical Exam  Nursing note and vitals reviewed. Constitutional: She appears well-developed and well-nourished. No distress.  HENT:  Head:  Normocephalic and atraumatic.  Right Ear: External ear normal.  Left Ear: External ear normal.  Eyes: Conjunctivae are normal. Right eye exhibits no discharge. Left eye exhibits no discharge. No scleral icterus.  Neck: Neck supple. No tracheal deviation present.  Cardiovascular: Normal rate, regular rhythm and intact distal pulses.   Pulmonary/Chest: Effort normal and breath sounds normal. No stridor. No respiratory distress. She has no wheezes. She has no rales.  Abdominal: Soft. Bowel sounds are normal. She exhibits no distension. There is no tenderness. There is no rebound and no guarding.  Musculoskeletal: She exhibits no edema and no  tenderness.  Neurological: She is alert. She has normal strength. No sensory deficit. Cranial nerve deficit:  no gross defecits noted. She exhibits normal muscle tone. She displays no seizure activity. Coordination normal.  Skin: Skin is warm and dry. No rash noted.  Psychiatric: She has a normal mood and affect.    ED Course  Procedures (including critical care time)  DIAGNOSTIC STUDIES: Oxygen Saturation is 100% on room air, normal by my interpretation.    COORDINATION OF CARE:     Labs Reviewed  CBC - Abnormal; Notable for the following:    Hemoglobin 11.8 (*)    MCH 25.7 (*)    All other components within normal limits  URINALYSIS, ROUTINE W REFLEX MICROSCOPIC - Abnormal; Notable for the following:    Hgb urine dipstick LARGE (*)    All other components within normal limits  URINE MICROSCOPIC-ADD ON - Abnormal; Notable for the following:    Squamous Epithelial / LPF FEW (*)    All other components within normal limits  DIFFERENTIAL  PREGNANCY, URINE  POCT PREGNANCY, URINE  COMPREHENSIVE METABOLIC PANEL  LIPASE, BLOOD   Ct Abdomen Pelvis W Contrast  12/09/2011  *RADIOLOGY REPORT*  Clinical Data: Right abdominal pain, nausea, vomiting.  CT ABDOMEN AND PELVIS WITH CONTRAST  Technique:  Multidetector CT imaging of the abdomen and pelvis was performed following the standard protocol during bolus administration of intravenous contrast.  Contrast: OMNIPAQUE IOHEXOL 300 MG/ML IJ SOLN  Comparison: None.  Findings: The appendix is retrocecal.  The tip is borderline in prominence at 7 mm.  There is no significant surrounding inflammatory stranding.  Therefore I doubt appendicitis.  There are prominent central mesenteric lymph nodes suggesting mesenteric adenitis.  Small bowel is decompressed.  Moderate free fluid in the pelvis.  Uterus and adnexa unremarkable. Aorta is normal caliber.  Liver, gallbladder, spleen, pancreas, adrenals and kidneys are unremarkable.  No acute bony  abnormality.  Bilateral L5 pars defects.  IMPRESSION: Prominent central mesenteric lymph nodes suggesting mesenteric adenitis.  Tip in the appendix slightly prominent relative to the remainder of the appendix.  However, the tip remains upper limits normal in caliber and there is no surrounding inflammatory change to suggest acute appendicitis.  Moderate free fluid in the pelvis.  Original Report Authenticated By: Cyndie Chime, M.D.      MDM  I reviewed the CT scan finding. Is not suspicious for appendicitis. There is a slight increase in the tip of the appendix however it is still within normal range. At this point I do not feel that surgery is indicated. She does have nausea vomiting today and does have history of Cushing's syndrome. I will give her a dose of stress dose steroids.   The patient's initial labs are normal. At this time there does not appear to be any evidence of an acute emergency medical condition. She has been given a  dose of steroids considering her adrenal insufficiency. Plan will be to discharge patient home with medications for pain and nausea.        Celene Kras, MD 12/09/11 [phone]

## 2011-12-09 NOTE — Assessment & Plan Note (Signed)
Reassuring EKG, no ischemia, sinus rhythm, also h/o inconsistent with heart disease

## 2011-12-09 NOTE — Discharge Instructions (Signed)

## 2011-12-11 LAB — URINE CULTURE: Colony Count: >=100000

## 2012-01-05 ENCOUNTER — Encounter: Payer: Self-pay | Admitting: Family Medicine

## 2012-03-02 ENCOUNTER — Other Ambulatory Visit (HOSPITAL_COMMUNITY)
Admission: RE | Admit: 2012-03-02 | Discharge: 2012-03-02 | Disposition: A | Discharge status: Home / Self Care | Payer: BC Managed Care – PPO | Source: Ambulatory Visit | Attending: Obstetrics & Gynecology | Admitting: Obstetrics & Gynecology

## 2012-03-02 ENCOUNTER — Other Ambulatory Visit: Payer: Self-pay | Admitting: Obstetrics & Gynecology

## 2012-03-02 DIAGNOSIS — Z01419 Encounter for gynecological examination (general) (routine) without abnormal findings: Secondary | ICD-10-CM | POA: Insufficient documentation

## 2012-04-09 ENCOUNTER — Ambulatory Visit (INDEPENDENT_AMBULATORY_CARE_PROVIDER_SITE_OTHER): Payer: BC Managed Care – PPO | Admitting: Family Medicine

## 2012-04-09 ENCOUNTER — Encounter: Payer: Self-pay | Admitting: Family Medicine

## 2012-04-09 VITALS — BP 112/78 | HR 84 | Temp 98.9°F | Resp 16 | Ht 64.0 in | Wt 196.4 lb

## 2012-04-09 DIAGNOSIS — B349 Viral infection, unspecified: Secondary | ICD-10-CM | POA: Insufficient documentation

## 2012-04-09 DIAGNOSIS — B9789 Other viral agents as the cause of diseases classified elsewhere: Secondary | ICD-10-CM

## 2012-04-09 DIAGNOSIS — J209 Acute bronchitis, unspecified: Secondary | ICD-10-CM

## 2012-04-09 DIAGNOSIS — E669 Obesity, unspecified: Secondary | ICD-10-CM

## 2012-04-09 DIAGNOSIS — J029 Acute pharyngitis, unspecified: Secondary | ICD-10-CM | POA: Insufficient documentation

## 2012-04-09 LAB — POCT RAPID STREP A (OFFICE): Rapid Strep A Screen: NEGATIVE

## 2012-04-09 MED ORDER — AZITHROMYCIN 250 MG PO TABS: ORAL_TABLET | ORAL | Status: AC

## 2012-04-09 MED ORDER — FLUCONAZOLE 150 MG PO TABS: ORAL_TABLET | ORAL | Status: AC

## 2012-04-09 MED ORDER — BENZONATATE 100 MG PO CAPS: 100.0000 mg | ORAL_CAPSULE | Freq: Four times a day (QID) | ORAL | Status: DC | PRN

## 2012-04-09 NOTE — Assessment & Plan Note (Signed)
Antiinflammatories, rest and work excuse with fluids

## 2012-04-09 NOTE — Progress Notes (Signed)
  Subjective:    Patient ID: Janet [name], female    DOB: [birth year], 26 y.o.   MRN: [id number]  HPI 3 day h/o generalized body aches headache sore throat, chills and fever, uncomfortable even when lying down. Cough x 1 day, non productive. 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 Has gained weight and is working aggressively at reversing this, unfortunately some  May be due to recurrent disease   Review of Systems See HPI Denies chest pains, palpitations and leg swelling Denies abdominal pain, nausea, vomiting,diarrhea or constipation.   Denies dysuria, frequency, hesitancy or incontinence. Denies joint pain, swelling and limitation in mobility. Denies headaches, seizures, numbness, or tingling. Denies depression, anxiety or insomnia. Denies skin break down or rash.        Objective:   Physical Exam  Patient alert and oriented and in no cardiopulmonary distress.Ill appearing  HEENT: No facial asymmetry, EOMI, no sinus tenderness,  oropharynx pink and moist. Mild erythema in left oropharyngeal area Neck supple bilateral anterior cervical adenopathy Chest: decreased though adequate air entry, scattered crackles no wheezes CVS: S1, S2 no murmurs, no S3.  ABD: Soft non tender. Bowel sounds normal.  Ext: No edema  MS: Adequate ROM spine, shoulders, hips and knees.  Skin: Intact, no ulcerations or rash noted.  Psych: Good eye contact, normal affect. Memory intact not anxious or depressed appearing.  CNS: CN 2-12 intact, power, tone and sensation normal throughout.       Assessment & Plan:

## 2012-04-09 NOTE — Assessment & Plan Note (Signed)
Rapid strep checked and is negative

## 2012-04-09 NOTE — Assessment & Plan Note (Signed)
Decongestant and antibiotic prescribed 

## 2012-04-09 NOTE — Patient Instructions (Addendum)
F/u in 4 month.  Weight loss goal of 2 to 3 pounds per month  You have acute viral illness and mild bronchitis.  Fluids, rest, tylenol alternating with advil/ibuprofen for fever, chills and body aches  Work excuse to return on 04/11/2012  Antibiotic and decongestant are sent to your pharmacy, also medication for yeast infection causing vaginal discharge and itch if you get one.  It is important that you exercise regularly at least 45 minutes 5 times a week. If you develop chest pain, have severe difficulty breathing, or feel very tired, stop exercising immediately and seek medical attention    A healthy diet is rich in fruit, vegetables and whole grains. Poultry fish, nuts and beans are a healthy choice for protein rather then red meat. A low sodium diet and drinking 64 ounces of water daily is generally recommended. Oils and sweet should be limited. Carbohydrates especially for those who are diabetic or overweight, should be limited to 04-45 gram per meal. It is important to eat on a regular schedule, at least 3 times daily. Snacks should be primarily fruits, vegetables or nuts.

## 2012-04-09 NOTE — Assessment & Plan Note (Signed)
Deteriorated. Patient re-educated about  the importance of commitment to a  minimum of 150 minutes of exercise per week. The importance of healthy food choices with portion control discussed. Encouraged to start a food diary, count calories and to consider  joining a support group. Sample diet sheets offered. Goals set by the patient for the next several months.    

## 2012-04-10 ENCOUNTER — Telehealth: Payer: Self-pay | Admitting: Family Medicine

## 2012-04-10 NOTE — Telephone Encounter (Signed)
Noted she should be ok without an antibiotic, her illness was mainly viral

## 2012-08-10 ENCOUNTER — Ambulatory Visit: Payer: BC Managed Care – PPO | Admitting: Family Medicine

## 2013-02-20 ENCOUNTER — Telehealth: Payer: Self-pay | Admitting: Family Medicine

## 2013-02-20 DIAGNOSIS — K068 Other specified disorders of gingiva and edentulous alveolar ridge: Secondary | ICD-10-CM

## 2013-02-20 DIAGNOSIS — D649 Anemia, unspecified: Secondary | ICD-10-CM

## 2013-02-20 DIAGNOSIS — E669 Obesity, unspecified: Secondary | ICD-10-CM

## 2013-02-20 NOTE — Telephone Encounter (Signed)
Needs cbc, cmp , hBA1C, lipid, tSh, PT/PTT, pls order as fasting and letr her know, also do Vit D.

## 2013-02-21 NOTE — Telephone Encounter (Signed)
Labs ordered and patient aware  

## 2013-02-28 LAB — CBC
Hemoglobin: 12.1 g/dL (ref 12.0–15.0)
MCH: 26.5 pg (ref 26.0–34.0)
MCHC: 33.6 g/dL (ref 30.0–36.0)
MCV: 78.8 fL (ref 78.0–100.0)
RBC: 4.57 MIL/uL (ref 3.87–5.11)

## 2013-02-28 LAB — LIPID PANEL
HDL: 48 mg/dL (ref >39)
Total CHOL/HDL Ratio: 2.9 Ratio
VLDL: 9 mg/dL (ref 0–40)

## 2013-02-28 LAB — APTT: aPTT: 34 seconds (ref 24–37)

## 2013-02-28 LAB — VITAMIN D 25 HYDROXY (VIT D DEFICIENCY, FRACTURES): Vit D, 25-Hydroxy: 18 ng/mL — ABNORMAL LOW (ref 30–89)

## 2013-02-28 LAB — HEMOGLOBIN A1C
Hgb A1c MFr Bld: 5.8 % — ABNORMAL HIGH (ref <5.7)
Mean Plasma Glucose: 120 mg/dL — ABNORMAL HIGH (ref <117)

## 2013-02-28 LAB — TSH: TSH: 0.981 u[IU]/mL (ref 0.350–4.500)

## 2013-03-06 ENCOUNTER — Ambulatory Visit (INDEPENDENT_AMBULATORY_CARE_PROVIDER_SITE_OTHER): Payer: BC Managed Care – PPO | Admitting: Family Medicine

## 2013-03-06 ENCOUNTER — Encounter: Payer: Self-pay | Admitting: Family Medicine

## 2013-03-06 VITALS — BP 126/74 | HR 76 | Resp 18 | Ht 64.0 in | Wt 200.0 lb

## 2013-03-06 DIAGNOSIS — R7303 Prediabetes: Secondary | ICD-10-CM | POA: Insufficient documentation

## 2013-03-06 DIAGNOSIS — I889 Nonspecific lymphadenitis, unspecified: Secondary | ICD-10-CM

## 2013-03-06 DIAGNOSIS — E559 Vitamin D deficiency, unspecified: Secondary | ICD-10-CM

## 2013-03-06 DIAGNOSIS — E669 Obesity, unspecified: Secondary | ICD-10-CM

## 2013-03-06 DIAGNOSIS — Z23 Encounter for immunization: Secondary | ICD-10-CM

## 2013-03-06 DIAGNOSIS — Z79899 Other long term (current) drug therapy: Secondary | ICD-10-CM

## 2013-03-06 DIAGNOSIS — E249 Cushing's syndrome, unspecified: Secondary | ICD-10-CM

## 2013-03-06 DIAGNOSIS — R7309 Other abnormal glucose: Secondary | ICD-10-CM

## 2013-03-06 DIAGNOSIS — K219 Gastro-esophageal reflux disease without esophagitis: Secondary | ICD-10-CM | POA: Insufficient documentation

## 2013-03-06 MED ORDER — ERGOCALCIFEROL 1.25 MG (50000 UT) PO CAPS: 50000.0000 [IU] | ORAL_CAPSULE | ORAL | Status: AC

## 2013-03-06 MED ORDER — DOXYCYCLINE HYCLATE 50 MG PO CAPS: 50.0000 mg | ORAL_CAPSULE | Freq: Two times a day (BID) | ORAL | Status: AC

## 2013-03-06 MED ORDER — OMEPRAZOLE 20 MG PO CPDR: 20.0000 mg | DELAYED_RELEASE_CAPSULE | Freq: Two times a day (BID) | ORAL | Status: DC

## 2013-03-06 MED ORDER — METFORMIN HCL 500 MG PO TABS: 500.0000 mg | ORAL_TABLET | Freq: Every day | ORAL | Status: DC

## 2013-03-06 NOTE — Patient Instructions (Addendum)
F/u in 4 month  You need to follow a 1200 or 1500 calorie diet , and commit to walking 3 miles every day, this should add up to 2 pounds weight loss per week  You are referred for individual nutrtional counseling  Please start your food diary today, counting calories.  New is metformin 1 daily if you can tolerate this  Also omeprazole and weekly vit D  HBA1C and chem 7 in 4 month  We will send to [facility] for HIDA scan report  You are referred to GI re reflux  Doxycycline for tender neck glandDiet for Gastroesophageal Reflux Disease, Adult Reflux (acid reflux) is when acid from your stomach flows up into the esophagus. When acid comes in contact with the esophagus, the acid causes irritation and soreness (inflammation) in the esophagus. When reflux happens often or so severely that it causes damage to the esophagus, it is called gastroesophageal reflux disease (GERD). Nutrition therapy can help ease the discomfort of GERD. FOODS OR DRINKS TO AVOID OR LIMIT  Smoking or chewing tobacco. Nicotine is one of the most potent stimulants to acid production in the gastrointestinal tract.  Caffeinated and decaffeinated coffee and black tea.  Regular or low-calorie carbonated beverages or energy drinks (caffeine-free carbonated beverages are allowed).   Strong spices, such as black pepper, white pepper, red pepper, cayenne, curry powder, and chili powder.  Peppermint or spearmint.  Chocolate.  High-fat foods, including meats and fried foods. Extra added fats including oils, butter, salad dressings, and nuts. Limit these to less than 8 tsp per day.  Fruits and vegetables if they are not tolerated, such as citrus fruits or tomatoes.  Alcohol.  Any food that seems to aggravate your condition. If you have questions regarding your diet, call your caregiver or a registered dietitian. OTHER THINGS THAT MAY HELP GERD INCLUDE:   Eating your meals slowly, in a relaxed setting.  Eating 5 to  6 small meals per day instead of 3 large meals.  Eliminating food for a period of time if it causes distress.  Not lying down until 3 hours after eating a meal.  Keeping the head of your bed raised 6 to 9 inches (15 to 23 cm) by using a foam wedge or blocks under the legs of the bed. Lying flat may make symptoms worse.  Being physically active. Weight loss may be helpful in reducing reflux in overweight or obese adults.  Wear loose fitting clothing EXAMPLE MEAL PLAN This meal plan is approximately 2,000 calories based on https://www.[facility].org/ meal planning guidelines. Breakfast   cup cooked oatmeal.  1 cup strawberries.  1 cup low-fat milk.  1 oz almonds. Snack  1 cup cucumber slices.  6 oz yogurt (made from low-fat or fat-free milk). Lunch  2 slice whole-wheat bread.  2 oz sliced [place].  2 tsp mayonnaise.  1 cup blueberries.  1 cup snap peas. Snack  6 whole-wheat crackers.  1 oz string cheese. Dinner   cup brown rice.  1 cup mixed veggies.  1 tsp olive oil.  3 oz grilled fish. Document Released: 09/26/2005 Document Revised: 12/19/2011 Document Reviewed: 08/12/2011 [facility] Patient Information 2014 [place], [place].

## 2013-03-06 NOTE — Progress Notes (Signed)
  Subjective:    Patient ID: Janet [name], female    DOB: [birth year], 27 y.o.   MRN: [id number]  HPI Here for annual exam. C/o weight gain wants help with this. Heartburn has been an increasing problem since 2012, prilosec helps, also has solid dysphagia  Also c/o constipatpation on average every 1 to 2 weeks Pain on right side of neck x 4 days has to use a pillow for relief when she lies down. Very heartburn this week. First episode of neck pain Notes excessive bleeding in teeth , bruising , and nose bleeds from both nostrils, states this is not severe as before and wants to hold on hematology eval Has not been to endo since 08/2012, states she was told no more f/u needed but keep up with PCP   Review of Systems See HPI Denies recent fever or chills. Denies sinus pressure, nasal congestion, ear pain or sore throat. Denies chest congestion, productive cough or wheezing. Denies chest pains, palpitations and leg swelling   Denies dysuria, frequency, hesitancy or incontinence. Denies joint pain, swelling and limitation in mobility. Denies headaches, seizures, numbness, or tingling. Denies depression, anxiety or insomnia. Denies skin break down or rash.        Objective:   Physical Exam  Patient alert and oriented and in no cardiopulmonary distress.  HEENT: No facial asymmetry, EOMI, no sinus tenderness,  oropharynx pink and moist.  Neck supple no adenopathy.  Chest: Clear to auscultation bilaterally.  CVS: S1, S2 no murmurs, no S3.  ABD: Soft mild epigastric tenderness. Bowel sounds normal.No organomegaly or mases  Ext: No edema  MS: Adequate ROM spine, shoulders, hips and knees.  Skin: Intact, no ulcerations or rash noted.  Psych: Good eye contact, normal affect. Memory intact not anxious or depressed appearing.  CNS: CN 2-12 intact, power, tone and sensation normal throughout.       Assessment & Plan:

## 2013-03-07 ENCOUNTER — Telehealth (HOSPITAL_COMMUNITY): Payer: Self-pay | Admitting: Dietician

## 2013-03-07 NOTE — Telephone Encounter (Signed)
Received voicemail message left at [phone]. Called back at 1438; appointment scheduled for 03/27/13 at 1430.

## 2013-03-07 NOTE — Telephone Encounter (Signed)
Called and left message on pt voicemail at [phone].

## 2013-03-07 NOTE — Telephone Encounter (Signed)
Received referral via fax from Dr. Simpson for dx: obesity, prediabetes.  

## 2013-03-17 NOTE — Assessment & Plan Note (Signed)
Increased symptoms with dysphagia and uncontrolled pt to see GI

## 2013-03-17 NOTE — Assessment & Plan Note (Signed)
Deteriorated. Patient re-educated about  the importance of commitment to a  minimum of 150 minutes of exercise per week. The importance of healthy food choices with portion control discussed. Encouraged to start a food diary, count calories and to consider  joining a support group. Sample diet sheets offered. Goals set by the patient for the next several months.    

## 2013-03-17 NOTE — Assessment & Plan Note (Signed)
Patient educated about the importance of limiting  Carbohydrate intake , the need to commit to daily physical activity for a minimum of 30 minutes , and to commit weight loss. The fact that changes in all these areas will reduce or eliminate all together the development of diabetes is stressed.    

## 2013-03-17 NOTE — Assessment & Plan Note (Signed)
Pt to re establish with endo at [place]

## 2013-03-17 NOTE — Assessment & Plan Note (Signed)
Pt to take weekly supplement to correct

## 2013-03-22 ENCOUNTER — Other Ambulatory Visit: Payer: Self-pay | Admitting: Gastroenterology

## 2013-03-22 ENCOUNTER — Encounter: Payer: Self-pay | Admitting: Gastroenterology

## 2013-03-22 ENCOUNTER — Ambulatory Visit (INDEPENDENT_AMBULATORY_CARE_PROVIDER_SITE_OTHER): Payer: BC Managed Care – PPO | Admitting: Gastroenterology

## 2013-03-22 VITALS — BP 135/93 | HR 66 | Temp 97.2°F | Ht 64.0 in | Wt 196.8 lb

## 2013-03-22 DIAGNOSIS — R1013 Epigastric pain: Secondary | ICD-10-CM | POA: Insufficient documentation

## 2013-03-22 DIAGNOSIS — R1314 Dysphagia, pharyngoesophageal phase: Secondary | ICD-10-CM

## 2013-03-22 DIAGNOSIS — K219 Gastro-esophageal reflux disease without esophagitis: Secondary | ICD-10-CM

## 2013-03-22 DIAGNOSIS — R1011 Right upper quadrant pain: Secondary | ICD-10-CM | POA: Insufficient documentation

## 2013-03-22 DIAGNOSIS — R194 Change in bowel habit: Secondary | ICD-10-CM | POA: Insufficient documentation

## 2013-03-22 DIAGNOSIS — R198 Other specified symptoms and signs involving the digestive system and abdomen: Secondary | ICD-10-CM

## 2013-03-22 DIAGNOSIS — R1310 Dysphagia, unspecified: Secondary | ICD-10-CM | POA: Insufficient documentation

## 2013-03-22 DIAGNOSIS — K625 Hemorrhage of anus and rectum: Secondary | ICD-10-CM | POA: Insufficient documentation

## 2013-03-22 DIAGNOSIS — R1319 Other dysphagia: Secondary | ICD-10-CM | POA: Insufficient documentation

## 2013-03-22 MED ORDER — POLYETHYLENE GLYCOL 3350 17 GM/SCOOP PO POWD: 17.0000 g | Freq: Every day | ORAL | Status: DC

## 2013-03-22 MED ORDER — PEG 3350-KCL-NA BICARB-NACL 420 G PO SOLR: 4000.0000 mL | ORAL | Status: DC

## 2013-03-22 NOTE — Progress Notes (Signed)
Primary Care Physician:  Syliva Overman, MD  Primary Gastroenterologist:  Jonette Eva, MD   Chief Complaint  Patient presents with  . Heartburn  . Abdominal Pain    HPI:  Janet [name] is a 27 y.o. female here for further evaluation heartburn, abdominal pain, change in bowel habits. Before surgery for Cushing's disease/adrenal tumor in 2012, she had regular BMs. 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 Now with difficulty going to bathroom.  May go one to 2 weeks without a bowel movement.she does not tolerate stimulants such as ex-lax or Dulcolax due to vomiting. 2 days ago she had vomiting and diarrhea and feels like she has a virus. She has multiple ill contacts. She is starting to feel better however. No stools today. She also complains of chronic intermittent right-sided abdominal pain. She really hasn't paid attention to whether or not the pain is related to meals or bowel movements. Does not know whether is related to movement. Last year she had pain in the right lower abdomen he presented for a CT scan that showed her tip of the appendix was slightly more prominent. She did not have surgery for appendicitis. She is disgusted with progressive weight gain and recently started change her dietary habits and started back exercising. She is going to see the dietitian next week. She has been classified as prediabetic and recently was given metformin which she has not started a regular basis. She did take metformin prior to her surgery for adrenal tumor. She denies any melena or rectal bleeding.   Not noted previously, she had new onset heartburn symptoms. Recently started on Prilosec twice a day with improvement of the heartburn. She had also noted some mild dysphagia to solid foods which has improved somewhat as well.   Current Outpatient Prescriptions  Medication Sig Dispense Refill  . ergocalciferol (VITAMIN D2) 50000 UNITS capsule Take 1 capsule  (50,000 Units total) by mouth once a week. One capsule once weekly  12 capsule  1  . metFORMIN (GLUCOPHAGE) 500 MG tablet Take 500 mg by mouth daily with breakfast.      . omeprazole (PRILOSEC) 20 MG capsule Take 1 capsule (20 mg total) by mouth 2 (two) times daily.  60 capsule  1  . polyethylene glycol powder (MIRALAX) powder Take 17 g by mouth daily.      . polyethylene glycol-electrolytes (TRILYTE) 420 G solution Take 4,000 mLs by mouth as directed.  4000 mL  0   No current facility-administered medications for this visit.    Allergies as of 03/22/2013 - Review Complete 03/22/2013  Allergen Reaction Noted  . Penicillins Anaphylaxis and Rash   . Iodine  01/11/2011  . Latex  01/11/2011    Past Medical History  Diagnosis Date  . Cushing syndrome dx in 2011    Past Surgical History  Procedure Laterality Date  . 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 Tonsillectomy      Family History  Problem Relation Age of Onset  . Hypertension Mother   . Hyperlipidemia Mother   . Hypertension Father   . Hypertension Maternal Grandmother   . Diabetes Maternal Grandmother   . Hypertension Paternal Grandmother   . Diabetes Paternal Grandmother   . COPD Paternal Grandfather   . Heart disease Paternal Grandfather   . Colon cancer Other     maternal great uncle    History   Social History  . Marital Status: Single  Spouse Name: N/A    Number of Children: 0  . Years of Education: N/A   Occupational History  .     Social History Main Topics  . Smoking status: Never Smoker   . Smokeless tobacco: Not on file  . Alcohol Use: No  . Drug Use: No  . Sexually Active: Yes    Birth Control/ Protection: None   Other Topics Concern  . Not on file   Social History Narrative  . No narrative on file      ROS:  General: Negative for anorexia, weight loss, fever, chills, fatigue, weakness. Eyes: Negative for vision changes.  ENT: Negative for hoarseness, nasal congestion. CV:  Negative for chest pain, angina, palpitations, dyspnea on exertion, peripheral edema.  Respiratory: Negative for dyspnea at rest, dyspnea on exertion, cough, sputum, wheezing.  GI: See history of present illness. GU:  Negative for dysuria, hematuria, urinary incontinence, urinary frequency, nocturnal urination.  MS: Negative for joint pain, low back pain.  Derm: Negative for rash or itching.  Neuro: Negative for weakness, abnormal sensation, seizure, frequent headaches, memory loss, confusion.  Psych: Negative for anxiety, depression, suicidal ideation, hallucinations.  Endo: Negative for unusual weight change.  Heme: Negative for bruising or bleeding. Allergy: Negative for rash or hives.    Physical Examination:  BP 135/93  Pulse 66  Temp(Src) 97.2 F (36.2 C) (Oral)  Ht 5\' 4"  (1.626 m)  Wt 196 lb 12.8 oz (89.268 kg)  BMI 33.76 kg/m2  LMP 03/13/2013   General: Well-nourished, well-developed in no acute distress.  Head: Normocephalic, atraumatic.   Eyes: Conjunctiva pink, no icterus. Mouth: Oropharyngeal mucosa moist and pink , no lesions erythema or exudate. Neck: Supple without thyromegaly, masses, or lymphadenopathy.  Lungs: Clear to auscultation bilaterally.  Heart: Regular rate and rhythm, no murmurs rubs or gallops.  Abdomen: Bowel sounds are normal, mild to moderate epigastric/right upper quadrant abdominal tenderness, nondistended, no hepatosplenomegaly or masses, no abdominal bruits or    hernia , no rebound or guarding.   Rectal: Deferred Extremities: No lower extremity edema. No clubbing or deformities.  Neuro: Alert and oriented x 4 , grossly normal neurologically.  Skin: Warm and dry, no rash or jaundice.   Psych: Alert and cooperative, normal mood and affect.  Labs: Lab Results  Component Value Date   WBC 5.0 02/27/2013   HGB 12.1 02/27/2013   HCT 36.0 02/27/2013   MCV 78.8 02/27/2013   PLT 366 02/27/2013   Lab Results  Component Value Date   TSH 0.981  02/27/2013     Imaging Studies: No results found.

## 2013-03-22 NOTE — Assessment & Plan Note (Signed)
Epigastric/ruq pain. EGD/TCS first. Further recommendations to follow.

## 2013-03-22 NOTE — Patient Instructions (Addendum)
1. Please start MiraLax 1 capful daily for constipation. You may take up to 3 times daily for the first 3 days to get your boweld started.  2. We have scheduled you for a colonoscopy and upper endoscopy. Please see separate instructions. 3. Continue Prilosec.  Constipation, Adult Constipation is when a person has fewer than 3 bowel movements a week; has difficulty having a bowel movement; or has stools that are dry, hard, or larger than normal. As people grow older, constipation is more common. If you try to fix constipation with medicines that make you have a bowel movement (laxatives), the problem may get worse. Long-term laxative use may cause the muscles of the colon to become weak. A low-fiber diet, not taking in enough fluids, and taking certain medicines may make constipation worse. CAUSES   Certain medicines, such as antidepressants, pain medicine, iron supplements, antacids, and water pills.   Certain diseases, such as diabetes, irritable bowel syndrome (IBS), thyroid disease, or depression.   Not drinking enough water.   Not eating enough fiber-rich foods.   Stress or travel.  Lack of physical activity or exercise.  Not going to the restroom when there is the urge to have a bowel movement.  Ignoring the urge to have a bowel movement.  Using laxatives too much. SYMPTOMS   Having fewer than 3 bowel movements a week.   Straining to have a bowel movement.   Having hard, dry, or larger than normal stools.   Feeling full or bloated.   Pain in the lower abdomen.  Not feeling relief after having a bowel movement. DIAGNOSIS  Your caregiver will take a medical history and perform a physical exam. Further testing may be done for severe constipation. Some tests may include:   A barium enema X-ray to examine your rectum, colon, and sometimes, your small intestine.  A sigmoidoscopy to examine your lower colon.  A colonoscopy to examine your entire colon. TREATMENT   Treatment will depend on the severity of your constipation and what is causing it. Some dietary treatments include drinking more fluids and eating more fiber-rich foods. Lifestyle treatments may include regular exercise. If these diet and lifestyle recommendations do not help, your caregiver may recommend taking over-the-counter laxative medicines to help you have bowel movements. Prescription medicines may be prescribed if over-the-counter medicines do not work.  HOME CARE INSTRUCTIONS   Increase dietary fiber in your diet, such as fruits, vegetables, whole grains, and beans. Limit high-fat and processed sugars in your diet, such as [place] fries, hamburgers, cookies, candies, and soda.   A fiber supplement may be added to your diet if you cannot get enough fiber from foods.   Drink enough fluids to keep your urine clear or pale yellow.   Exercise regularly or as directed by your caregiver.   Go to the restroom when you have the urge to go. Do not hold it.  Only take medicines as directed by your caregiver. Do not take other medicines for constipation without talking to your caregiver first. SEEK IMMEDIATE MEDICAL CARE IF:   You have bright red blood in your stool.   Your constipation lasts for more than 4 days or gets worse.   You have abdominal or rectal pain.   You have thin, pencil-like stools.  You have unexplained weight loss. MAKE SURE YOU:   Understand these instructions.  Will watch your condition.  Will get help right away if you are not doing well or get worse. Document Released: 06/24/2004 Document  Revised: 12/19/2011 Document Reviewed: 08/30/2011 [facility] Patient Information 2014 [place], [place].  Gastroesophageal Reflux Disease, Adult Gastroesophageal reflux disease (GERD) happens when acid from your stomach flows up into the esophagus. When acid comes in contact with the esophagus, the acid causes soreness (inflammation) in the esophagus. Over time, GERD  may create small holes (ulcers) in the lining of the esophagus. CAUSES   Increased body weight. This puts pressure on the stomach, making acid rise from the stomach into the esophagus.  Smoking. This increases acid production in the stomach.  Drinking alcohol. This causes decreased pressure in the lower esophageal sphincter (valve or ring of muscle between the esophagus and stomach), allowing acid from the stomach into the esophagus.  Late evening meals and a full stomach. This increases pressure and acid production in the stomach.  A malformed lower esophageal sphincter. Sometimes, no cause is found. SYMPTOMS   Burning pain in the lower part of the mid-chest behind the breastbone and in the mid-stomach area. This may occur twice a week or more often.  Trouble swallowing.  Sore throat.  Dry cough.  Asthma-like symptoms including chest tightness, shortness of breath, or wheezing. DIAGNOSIS  Your caregiver may be able to diagnose GERD based on your symptoms. In some cases, X-rays and other tests may be done to check for complications or to check the condition of your stomach and esophagus. TREATMENT  Your caregiver may recommend over-the-counter or prescription medicines to help decrease acid production. Ask your caregiver before starting or adding any new medicines.  HOME CARE INSTRUCTIONS   Change the factors that you can control. Ask your caregiver for guidance concerning weight loss, quitting smoking, and alcohol consumption.  Avoid foods and drinks that make your symptoms worse, such as:  Caffeine or alcoholic drinks.  Chocolate.  Peppermint or mint flavorings.  Garlic and onions.  Spicy foods.  Citrus fruits, such as oranges, lemons, or limes.  Tomato-based foods such as sauce, chili, salsa, and pizza.  Fried and fatty foods.  Avoid lying down for the 3 hours prior to your bedtime or prior to taking a nap.  Eat small, frequent meals instead of large  meals.  Wear loose-fitting clothing. Do not wear anything tight around your waist that causes pressure on your stomach.  Raise the head of your bed 6 to 8 inches with wood blocks to help you sleep. Extra pillows will not help.  Only take over-the-counter or prescription medicines for pain, discomfort, or fever as directed by your caregiver.  Do not take aspirin, ibuprofen, or other nonsteroidal anti-inflammatory drugs (NSAIDs). SEEK IMMEDIATE MEDICAL CARE IF:   You have pain in your arms, neck, jaw, teeth, or back.  Your pain increases or changes in intensity or duration.  You develop nausea, vomiting, or sweating (diaphoresis).  You develop shortness of breath, or you faint.  Your vomit is green, yellow, black, or looks like coffee grounds or blood.  Your stool is red, bloody, or black. These symptoms could be signs of other problems, such as heart disease, gastric bleeding, or esophageal bleeding. MAKE SURE YOU:   Understand these instructions.  Will watch your condition.  Will get help right away if you are not doing well or get worse. Document Released: 07/06/2005 Document Revised: 12/19/2011 Document Reviewed: 04/15/2011 [facility] Patient Information 2014 [place], [place].

## 2013-03-22 NOTE — Assessment & Plan Note (Signed)
Recent worsening heartburn associated with esophageal dysphagia. Symptoms somewhat better on Prilosec twice a day. No prior upper endoscopy. She is noted to have moderate epigastric pain on exam. Recommend EGD with possible dilation if needed in the near future.  I have discussed the risks, alternatives, benefits with regards to but not limited to the risk of reaction to medication, bleeding, infection, perforation and the patient is agreeable to proceed. Written consent to be obtained.  Continue omeprazole BID for now. Antireflux measures.  Slow gradual weight loss.

## 2013-03-22 NOTE — Assessment & Plan Note (Signed)
Change in bowel habits from normal bowel movements to progressive constipation over the past couple of years. Over last several months symptoms are gradually getting worse. Occasional bright red blood per rectum. Intolerant of multiple laxatives which include stimulants secondary to vomiting. She has increased her dietary fiber intake. She is also consuming a high-fiber one bar daily. Encouraged her to continue to increase dietary fiber, non-caffeinated beverages, daily exercise. We discussed possibilities of prescription medications for chronic constipation but she is not interested at this time. Start with MiraLax 17 g up to 3 times daily for 3 days and then back down to once daily as needed for constipation. Right now she is getting over a stomach virus and will hold on the medication. Colonoscopy in the near future.  I have discussed the risks, alternatives, benefits with regards to but not limited to the risk of reaction to medication, bleeding, infection, perforation and the patient is agreeable to proceed. Written consent to be obtained.

## 2013-03-25 NOTE — Progress Notes (Signed)
Cc PCP 

## 2013-03-27 ENCOUNTER — Telehealth (HOSPITAL_COMMUNITY): Payer: Self-pay | Admitting: Dietician

## 2013-03-27 NOTE — Telephone Encounter (Signed)
Noted  

## 2013-03-27 NOTE — Telephone Encounter (Signed)
Pt was a no show for appointment 03/27/2013 at 1430. Sent letter to pt home notifying pt of no-show and requesting rescheduling appointment.

## 2013-03-28 ENCOUNTER — Ambulatory Visit: Payer: BC Managed Care – PPO | Admitting: Gastroenterology

## 2013-04-02 ENCOUNTER — Telehealth: Payer: Self-pay | Admitting: Family Medicine

## 2013-04-02 NOTE — Telephone Encounter (Signed)
Noted  

## 2013-04-08 ENCOUNTER — Encounter (HOSPITAL_COMMUNITY): Payer: Self-pay | Admitting: Pharmacy Technician

## 2013-04-08 NOTE — Telephone Encounter (Signed)
Noted and agree. 

## 2013-04-19 ENCOUNTER — Ambulatory Visit (HOSPITAL_COMMUNITY)
Admission: RE | Admit: 2013-04-19 | Discharge: 2013-04-19 | Disposition: A | Discharge status: Home / Self Care | Payer: BC Managed Care – PPO | Source: Ambulatory Visit | Attending: Gastroenterology | Admitting: Gastroenterology

## 2013-04-19 ENCOUNTER — Encounter (HOSPITAL_COMMUNITY)
Admission: RE | Disposition: A | Discharge status: Home / Self Care | Payer: Self-pay | Source: Ambulatory Visit | Attending: Gastroenterology

## 2013-04-19 ENCOUNTER — Encounter (HOSPITAL_COMMUNITY): Payer: Self-pay | Admitting: *Deleted

## 2013-04-19 DIAGNOSIS — R1310 Dysphagia, unspecified: Secondary | ICD-10-CM

## 2013-04-19 DIAGNOSIS — K219 Gastro-esophageal reflux disease without esophagitis: Secondary | ICD-10-CM

## 2013-04-19 DIAGNOSIS — K2940 Chronic atrophic gastritis without bleeding: Secondary | ICD-10-CM | POA: Insufficient documentation

## 2013-04-19 DIAGNOSIS — R110 Nausea: Secondary | ICD-10-CM | POA: Insufficient documentation

## 2013-04-19 DIAGNOSIS — K2990 Gastroduodenitis, unspecified, without bleeding: Secondary | ICD-10-CM

## 2013-04-19 DIAGNOSIS — K922 Gastrointestinal hemorrhage, unspecified: Secondary | ICD-10-CM | POA: Insufficient documentation

## 2013-04-19 DIAGNOSIS — K5900 Constipation, unspecified: Secondary | ICD-10-CM

## 2013-04-19 DIAGNOSIS — R1011 Right upper quadrant pain: Secondary | ICD-10-CM

## 2013-04-19 DIAGNOSIS — K6389 Other specified diseases of intestine: Secondary | ICD-10-CM

## 2013-04-19 DIAGNOSIS — K648 Other hemorrhoids: Secondary | ICD-10-CM | POA: Insufficient documentation

## 2013-04-19 DIAGNOSIS — Z01812 Encounter for preprocedural laboratory examination: Secondary | ICD-10-CM | POA: Insufficient documentation

## 2013-04-19 DIAGNOSIS — R1033 Periumbilical pain: Secondary | ICD-10-CM | POA: Insufficient documentation

## 2013-04-19 DIAGNOSIS — R1319 Other dysphagia: Secondary | ICD-10-CM

## 2013-04-19 DIAGNOSIS — Z833 Family history of diabetes mellitus: Secondary | ICD-10-CM | POA: Insufficient documentation

## 2013-04-19 DIAGNOSIS — K2970 Gastritis, unspecified, without bleeding: Secondary | ICD-10-CM

## 2013-04-19 DIAGNOSIS — K625 Hemorrhage of anus and rectum: Secondary | ICD-10-CM

## 2013-04-19 DIAGNOSIS — R1013 Epigastric pain: Secondary | ICD-10-CM

## 2013-04-19 DIAGNOSIS — K3189 Other diseases of stomach and duodenum: Secondary | ICD-10-CM | POA: Insufficient documentation

## 2013-04-19 DIAGNOSIS — R194 Change in bowel habit: Secondary | ICD-10-CM

## 2013-04-19 HISTORY — PX: COLONOSCOPY: SHX5424

## 2013-04-19 HISTORY — PX: ESOPHAGOGASTRODUODENOSCOPY: SHX5428

## 2013-04-19 LAB — GLUCOSE, CAPILLARY: Glucose-Capillary: 92 mg/dL (ref 70–99)

## 2013-04-19 SURGERY — COLONOSCOPY
Anesthesia: Moderate Sedation

## 2013-04-19 MED ORDER — MIDAZOLAM HCL 5 MG/5ML IJ SOLN
INTRAMUSCULAR | Status: DC | PRN
  Administered 2013-04-19 (×2): 2 mg via INTRAVENOUS
  Administered 2013-04-19 (×3): 1 mg via INTRAVENOUS

## 2013-04-19 MED ORDER — LUBIPROSTONE 24 MCG PO CAPS: ORAL_CAPSULE | ORAL | Status: DC

## 2013-04-19 MED ORDER — SODIUM CHLORIDE 0.9 % IV SOLN
INTRAVENOUS | Status: DC
  Administered 2013-04-19: 08:00:00 via INTRAVENOUS

## 2013-04-19 MED ORDER — STERILE WATER FOR IRRIGATION IR SOLN
Status: DC | PRN
  Administered 2013-04-19: 09:00:00

## 2013-04-19 MED ORDER — BUTAMBEN-TETRACAINE-BENZOCAINE 2-2-14 % EX AERO
INHALATION_SPRAY | CUTANEOUS | Status: DC | PRN
  Administered 2013-04-19: 2 via TOPICAL

## 2013-04-19 MED ORDER — MIDAZOLAM HCL 5 MG/5ML IJ SOLN
INTRAMUSCULAR | Status: AC
  Filled 2013-04-19: qty 10

## 2013-04-19 MED ORDER — MEPERIDINE HCL 100 MG/ML IJ SOLN
INTRAMUSCULAR | Status: AC
  Filled 2013-04-19: qty 2

## 2013-04-19 MED ORDER — MINERAL OIL PO OIL
TOPICAL_OIL | ORAL | Status: AC
  Filled 2013-04-19: qty 30

## 2013-04-19 MED ORDER — MEPERIDINE HCL 100 MG/ML IJ SOLN
INTRAMUSCULAR | Status: DC | PRN
  Administered 2013-04-19: 25 mg via INTRAVENOUS
  Administered 2013-04-19: 50 mg via INTRAVENOUS
  Administered 2013-04-19: 25 mg via INTRAVENOUS

## 2013-04-19 NOTE — Op Note (Signed)
[facility] [address] [place], [address]   COLONOSCOPY PROCEDURE REPORT  PATIENT: Janet [name], Janet [name]  MR#: [id number] BIRTHDATE: Jun 18, 1986 , 27  yrs. old GENDER: Female ENDOSCOPIST: Jonette Eva, MD REFERRED [id number] Lodema Hong, M.D. PROCEDURE DATE:  04/19/2013 PROCEDURE:   Colonoscopy with biopsy INDICATIONS:Constipation, periumbilical abdominal pain, and Rectal Bleeding. MEDICATIONS: Demerol 75 mg IV and Versed 7 mg IV  DESCRIPTION OF PROCEDURE:    Physical exam was performed.  Informed consent was obtained from the patient after explaining the benefits, risks, and alternatives to procedure.  The patient was connected to monitor and placed in left lateral position. Continuous oxygen was provided by nasal cannula and IV medicine administered through an indwelling cannula.  After administration of sedation and rectal exam, the patients rectum was intubated and the EC-3890Li ([id number]) and EG-2990i ([id number])  colonoscope was advanced under direct visualization to the ileum.  The scope was removed slowly by carefully examining the color, texture, anatomy, and integrity mucosa on the way out.  The patient was recovered in endoscopy and discharged home in satisfactory condition.       COLON FINDINGS: The mucosa appeared normal in the terminal ileum.  , Single erosion in transverse colon.  BIOPSIES OBTAINED VIA COLD FORCEPS, The colon was otherwise normal.  There was no diverticulosis, inflammation, polyps or cancers unless previously stated.  , and Moderate sized internal hemorrhoids were found.  PREP QUALITY: good. CECAL W/D TIME: 25 minutes  COMPLICATIONS: None  ENDOSCOPIC IMPRESSION: 1.   Normal mucosa in the terminal ileum 2.   Single erosion in transverse colon. 3.   RECTAL BLEEDING DUE TO Moderate sized internal hemorrhoids 4.   PERI-UMBILICAL PAIN DUE TO GERD, GASTRITIS, AND CONSTIPATION  RECOMMENDATIONS: ADD AMITIZA ONE PILL AT BEDTIME FOR 7  DAYS THEN ONE TWICE DAILY WITH FOOD. CONTINUE OMEPRAZOLE.  TAKE 30 MINUTES PRIOR TO YOUR MEALS TWICE DAILY. AVOID ITEMS THAT TRIGGER GASTRITIS. DRINK WATER TO KEEP YOUR URINE LIGHT YELLOW. FOLLOW A LOW FAT/HIGH FIBER DIET.  AVOID ITEMS THAT CAUSE BLOATING.  CONTINUE YOUR WEIGHT LOSS EFFORTS. BIOPSY WILL BE BACK IN 7 DAYS. FOLLOW UP IN 3 MOS.       _______________________________ Rosalie DoctorJonette Eva, MD 04/19/2013 4:23 PM     PATIENT NAME:  Janet [name] MR#: [id number]

## 2013-04-19 NOTE — Op Note (Signed)
[facility] [address] [place], [address]   ENDOSCOPY PROCEDURE REPORT  PATIENT: Janet [name], Janet [name]  MR#: [id number] BIRTHDATE: [birth year] , 27  yrs. old GENDER: Female  ENDOSCOPIST: Jonette Eva, MD REFERRED GM:[id number] Lodema Hong, M.D.  PROCEDURE DATE: 04/19/2013 PROCEDURE:   EGD w/ biopsy  INDICATIONS:Dyspepsia.   Epigastric pain.   Nausea. MEDICATIONS: TCS + Demerol 25 mg IV TOPICAL ANESTHETIC:   Cetacaine Spray  DESCRIPTION OF PROCEDURE:     Physical exam was performed.  Informed consent was obtained from the patient after explaining the benefits, risks, and alternatives to the procedure.  The patient was connected to the monitor and placed in the left lateral position.  Continuous oxygen was provided by nasal cannula and IV medicine administered through an indwelling cannula.  After administration of sedation, the patients esophagus was intubated and the EG-2990i ([id number])  endoscope was advanced under direct visualization to the second portion of the duodenum.  The scope was removed slowly by carefully examining the color, texture, anatomy, and integrity of the mucosa on the way out.  The patient was recovered in endoscopy and discharged home in satisfactory condition.   ESOPHAGUS: The mucosa of the esophagus appeared normal.   STOMACH: Mild non-erosive gastritis (inflammation) was found in the gastric antrum.  Multiple biopsies were performed using cold forceps. NORMAL SMALL BOWEL.  COMPLICATIONS:   None  ENDOSCOPIC IMPRESSION: 1.   PER-UMBILICAL PAIN DUE TO GERD, GASTRITIS, AND CONSTIPATION 2.   MILD Non-erosive gastritis  RECOMMENDATIONS: ADD AMITIZA ONE PILL AT BEDTIME FOR 7 DAYS THEN ONE TWICE DAILY WITH FOOD. CONTINUE OMEPRAZOLE.  TAKE 30 MINUTES PRIOR TO YOUR MEALS TWICE DAILY. AVOID ITEMS THAT TRIGGER GASTRITIS. DRINK WATER TO KEEP YOUR URINE LIGHT YELLOW. FOLLOW A LOW FAT/HIGH FIBER DIET.  AVOID ITEMS THAT CAUSE BLOATING. SEE INFO  BELOW. CONTINUE YOUR WEIGHT LOSS EFFORTS. BIOPSY WILL BE BACK IN 7 DAYS. FOLLOW UP IN 3 MOS.   REPEAT EXAM:   _______________________________ Rosalie DoctorJonette Eva, MD 04/19/2013 4:19 PM       PATIENT NAME:  Janet [name] MR#: [id number]

## 2013-04-19 NOTE — H&P (Signed)
  Primary Care Physician:  Syliva Overman, MD Primary Gastroenterologist:  Dr. Darrick Penna  Pre-Procedure History & Physical: HPI:  Janet [name] is a 27 y.o. female here for BRBPR/ABDOMINAL PAIN. lmp last WED-SUN.   Past Medical History  Diagnosis Date  . Cushing syndrome dx in 2011    Past Surgical History  Procedure Laterality Date  . 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 Tonsillectomy      Prior to Admission medications   Medication Sig Start Date End Date Taking? Authorizing Provider  ergocalciferol (VITAMIN D2) 50000 UNITS capsule Take 1 capsule (50,000 Units total) by mouth once a week. One capsule once weekly 03/06/13 09/06/13 Yes Kerri Perches, MD  metFORMIN (GLUCOPHAGE) 500 MG tablet Take 500 mg by mouth daily with breakfast.   Yes Historical Provider, MD  Multiple Vitamin (MULTIVITAMIN WITH MINERALS) TABS Take 1 tablet by mouth daily.   Yes Historical Provider, MD  omeprazole (PRILOSEC) 20 MG capsule Take 1 capsule (20 mg total) by mouth 2 (two) times daily. 03/06/13 03/06/14 Yes Kerri Perches, MD  polyethylene glycol powder [facility]) powder Take 17 g by mouth daily. 03/22/13  Yes Tiffany Kocher, PA-C  polyethylene glycol-electrolytes (TRILYTE) 420 G solution Take 4,000 mLs by mouth as directed. 03/22/13  Yes West Bali, MD    Allergies as of 03/22/2013 - Review Complete 03/22/2013  Allergen Reaction Noted  . Penicillins Anaphylaxis and Rash   . Iodine  01/11/2011  . Latex  01/11/2011    Family History  Problem Relation Age of Onset  . Hypertension Mother   . Hyperlipidemia Mother   . Hypertension Father   . Hypertension Maternal Grandmother   . Diabetes Maternal Grandmother   . Hypertension Paternal Grandmother   . Diabetes Paternal Grandmother   . COPD Paternal Grandfather   . Heart disease Paternal Grandfather   . Colon cancer Other     maternal great uncle    History   Social History  . Marital Status: Single    Spouse Name: N/A   Number of Children: 0  . Years of Education: N/A   Occupational History  .     Social History Main Topics  . Smoking status: Never Smoker   . Smokeless tobacco: Not on file  . Alcohol Use: No  . Drug Use: No  . Sexually Active: Yes    Birth Control/ Protection: None   Other Topics Concern  . Not on file   Social History Narrative  . No narrative on file    Review of Systems: See HPI, otherwise negative ROS   Physical Exam: BP 132/79  Pulse 63  Temp(Src) 98.1 F (36.7 C) (Oral)  Resp 18  SpO2 100%  LMP 03/13/2013 General:   Alert,  pleasant and cooperative in NAD Head:  Normocephalic and atraumatic. Neck:  Supple; Lungs:  Clear throughout to auscultation.    Heart:  Regular rate and rhythm. Abdomen:  Soft, nontender and nondistended. Normal bowel sounds, without guarding, and without rebound.   Neurologic:  Alert and  oriented x4;  grossly normal neurologically.  Impression/Plan:     BRBPR/ABDOMINAL PAIN  PLAN: EGD/TCS TODAY

## 2013-04-23 ENCOUNTER — Encounter (HOSPITAL_COMMUNITY): Payer: Self-pay | Admitting: Gastroenterology

## 2013-04-24 ENCOUNTER — Telehealth: Payer: Self-pay | Admitting: Gastroenterology

## 2013-04-24 NOTE — Telephone Encounter (Signed)
PLEASE CALL PT.  Her colon Bx IS normal. HER stomach Bx shows gastritis. HER ABDOMINAL PAIN IS MOST LIKELY DUE TO REFLUX, GASTRITIS, AND CONSTIPATION. NO SOURCE FOR HER BACK PAIN WAS IDENTIFIED.   CONTINUE AMITIZA ONE PILL AT BEDTIME FOR 7 DAYS THEN ONE TWICE DAILY WITH FOOD.  CONTINUE OMEPRAZOLE.  TAKE 30 MINUTES PRIOR TO YOUR MEALS TWICE DAILY.  AVOID ITEMS THAT TRIGGER GASTRITIS.   DRINK WATER TO KEEP YOUR URINE LIGHT YELLOW.   FOLLOW A LOW FAT/HIGH FIBER DIET. AVOID ITEMS THAT CAUSE BLOATING.   CONTINUE YOUR WEIGHT LOSS EFFORTS.  FOLLOW UP OCT 10.

## 2013-04-25 NOTE — Telephone Encounter (Signed)
CC PCP 

## 2013-04-25 NOTE — Telephone Encounter (Signed)
Called and informed pt.  

## 2013-06-03 ENCOUNTER — Encounter: Payer: Self-pay | Admitting: Endocrinology

## 2013-06-03 ENCOUNTER — Ambulatory Visit (INDEPENDENT_AMBULATORY_CARE_PROVIDER_SITE_OTHER): Payer: BC Managed Care – PPO | Admitting: Endocrinology

## 2013-06-03 VITALS — BP 128/74 | HR 78 | Ht 64.0 in | Wt 200.0 lb

## 2013-06-03 DIAGNOSIS — E249 Cushing's syndrome, unspecified: Secondary | ICD-10-CM

## 2013-06-03 MED ORDER — DEXAMETHASONE 1 MG PO TABS: ORAL_TABLET | ORAL | Status: DC

## 2013-06-03 NOTE — Patient Instructions (Addendum)
you should do a "dexamethasone suppression test."  for this, you would take dexamethasone 1 mg at 10 pm, then come in for a "cortisol" blood test the next morning before 9 am.  you do not need to be fasting for this test. Also, let's check a 24-hr urine test.   Please come back for a follow-up appointment in 6 months.

## 2013-06-03 NOTE — Progress Notes (Signed)
Subjective:    Patient ID: Janet [name], female    DOB: Sep 25, 1986, 27 y.o.   MRN: [id number]  HPI In 2012, pt had left adrenalectomy at [facility] for left adrenal adenoma causing cushing's syndrome.  Since then, she has continued to have moderate weight gain, worst at the abdominal area.  She has associated swelling of the ankles.  She was advised not to undergo right adrenalectomy.  She wants to f/u here, due to the distance to [place].   Past Medical History  Diagnosis Date  . Cushing syndrome dx in 2011    Past Surgical History  Procedure Laterality Date  . 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 Tonsillectomy    . Colonoscopy N/A 04/19/2013    Procedure: COLONOSCOPY;  Surgeon: West Bali, MD;  Location: [facility];  Service: Endoscopy;  Laterality: N/A;  1:30-rescheduled to [address] to notify pt  . Esophagogastroduodenoscopy N/A 04/19/2013    Procedure: ESOPHAGOGASTRODUODENOSCOPY (EGD);  Surgeon: West Bali, MD;  Location: [facility];  Service: Endoscopy;  Laterality: N/A;    History   Social History  . Marital Status: Single    Spouse Name: N/A    Number of Children: 0  . Years of Education: N/A   Occupational History  .     Social History Main Topics  . Smoking status: Never Smoker   . Smokeless tobacco: Not on file  . Alcohol Use: No  . Drug Use: No  . Sexual Activity: Yes    Birth Control/ Protection: None   Other Topics Concern  . Not on file   Social History Narrative  . No narrative on file    Current Outpatient Prescriptions on File Prior to Visit  Medication Sig Dispense Refill  . ergocalciferol (VITAMIN D2) 50000 UNITS capsule Take 1 capsule (50,000 Units total) by mouth once a week. One capsule once weekly  12 capsule  1  . lubiprostone (AMITIZA) 24 MCG capsule 1 PO QHS FOR 7 DAYS AND THEN BID WITH FOOD. IT MAY CAUSE NAUSEA.  60 capsule  11  . metFORMIN (GLUCOPHAGE) 500 MG tablet Take 500 mg by mouth daily with breakfast.       . Multiple Vitamin (MULTIVITAMIN WITH MINERALS) TABS Take 1 tablet by mouth daily.      Marland Kitchen omeprazole (PRILOSEC) 20 MG capsule Take 1 capsule (20 mg total) by mouth 2 (two) times daily.  60 capsule  1   No current facility-administered medications on file prior to visit.    Allergies  Allergen Reactions  . Penicillins Anaphylaxis and Rash  . Iodine     Beta iodine causes rash  . Latex     Family History  Problem Relation Age of Onset  . Hypertension Mother   . Hyperlipidemia Mother   . Hypertension Father   . Hypertension Maternal Grandmother   . Diabetes Maternal Grandmother   . Hypertension Paternal Grandmother   . Diabetes Paternal Grandmother   . COPD Paternal Grandfather   . Heart disease Paternal Grandfather   . Colon cancer Other     maternal great uncle    BP 128/74  Pulse 78  Ht 5\' 4"  (1.626 m)  Wt 200 lb (90.719 kg)  BMI 34.31 kg/m2  SpO2 98%  Review of Systems She has fatigue and difficulty with concentration.      Objective:   Physical Exam VITAL SIGNS:  See vs page GENERAL: no distress Abdomen: obese.  No striae.  Ext: trace bilat leg edema.     Assessment & Plan:  Cortisol-overproducing adrenal adenoma, s/p resection in 2012. Weight gain.  Recurrence of the hypercortisolism should be excluded. Ankle swelling, uncertain etiology

## 2013-07-02 ENCOUNTER — Telehealth: Payer: Self-pay | Admitting: Adult Health

## 2013-07-02 NOTE — Telephone Encounter (Signed)
Spoke with pt. Was put on OrthoTriCycline Lo in the past for painful periods and irregular periods. She wants to start back on this. Pt also states you gave her Nystatin cream for under her breasts. She is requesting another Rx. Uses [facility]. Thanks!!!

## 2013-07-02 NOTE — Telephone Encounter (Signed)
Left message that needs appt, have not seen since 5/ 2013

## 2013-07-04 ENCOUNTER — Other Ambulatory Visit: Payer: Self-pay | Admitting: Family Medicine

## 2013-07-04 ENCOUNTER — Telehealth: Payer: Self-pay | Admitting: Family Medicine

## 2013-07-04 NOTE — Telephone Encounter (Signed)
Order sent to [facility]  

## 2013-07-05 LAB — BASIC METABOLIC PANEL
BUN: 6 mg/dL (ref 6–23)
CO2: 28 mEq/L (ref 19–32)
Calcium: 9.2 mg/dL (ref 8.4–10.5)
Creat: 0.74 mg/dL (ref 0.50–1.10)

## 2013-07-09 ENCOUNTER — Encounter: Payer: Self-pay | Admitting: Family Medicine

## 2013-07-09 ENCOUNTER — Encounter: Payer: Self-pay | Admitting: Adult Health

## 2013-07-09 ENCOUNTER — Ambulatory Visit (INDEPENDENT_AMBULATORY_CARE_PROVIDER_SITE_OTHER): Payer: BC Managed Care – PPO | Admitting: Adult Health

## 2013-07-09 ENCOUNTER — Ambulatory Visit (INDEPENDENT_AMBULATORY_CARE_PROVIDER_SITE_OTHER): Payer: BC Managed Care – PPO | Admitting: Family Medicine

## 2013-07-09 VITALS — BP 118/68 | HR 64 | Resp 18 | Wt 193.1 lb

## 2013-07-09 VITALS — BP 130/78 | Ht 64.0 in | Wt 198.0 lb

## 2013-07-09 DIAGNOSIS — Z7689 Persons encountering health services in other specified circumstances: Secondary | ICD-10-CM

## 2013-07-09 DIAGNOSIS — R51 Headache: Secondary | ICD-10-CM

## 2013-07-09 DIAGNOSIS — R7303 Prediabetes: Secondary | ICD-10-CM

## 2013-07-09 DIAGNOSIS — N390 Urinary tract infection, site not specified: Secondary | ICD-10-CM

## 2013-07-09 DIAGNOSIS — B3789 Other sites of candidiasis: Secondary | ICD-10-CM

## 2013-07-09 DIAGNOSIS — R7309 Other abnormal glucose: Secondary | ICD-10-CM

## 2013-07-09 DIAGNOSIS — E559 Vitamin D deficiency, unspecified: Secondary | ICD-10-CM

## 2013-07-09 DIAGNOSIS — Z23 Encounter for immunization: Secondary | ICD-10-CM

## 2013-07-09 DIAGNOSIS — R519 Headache, unspecified: Secondary | ICD-10-CM | POA: Insufficient documentation

## 2013-07-09 DIAGNOSIS — E669 Obesity, unspecified: Secondary | ICD-10-CM

## 2013-07-09 DIAGNOSIS — R319 Hematuria, unspecified: Secondary | ICD-10-CM

## 2013-07-09 LAB — POCT URINALYSIS DIPSTICK
Blood, UA: 1
Ketones, UA: NEGATIVE
Protein, UA: NEGATIVE

## 2013-07-09 MED ORDER — NORGESTIM-ETH ESTRAD TRIPHASIC 0.18/0.215/0.25 MG-35 MCG PO TABS: 1.0000 | ORAL_TABLET | Freq: Every day | ORAL | Status: DC

## 2013-07-09 MED ORDER — TOPIRAMATE 25 MG PO TABS: 25.0000 mg | ORAL_TABLET | Freq: Two times a day (BID) | ORAL | Status: DC

## 2013-07-09 MED ORDER — IBUPROFEN 800 MG PO TABS: ORAL_TABLET | ORAL | Status: DC

## 2013-07-09 MED ORDER — NYSTATIN 100000 UNIT/GM EX OINT: TOPICAL_OINTMENT | Freq: Two times a day (BID) | CUTANEOUS | Status: DC

## 2013-07-09 MED ORDER — NITROFURANTOIN MONOHYD MACRO 100 MG PO CAPS: 100.0000 mg | ORAL_CAPSULE | Freq: Two times a day (BID) | ORAL | Status: DC

## 2013-07-09 NOTE — Patient Instructions (Addendum)
F/u in 4  Month, call if you need me before  Your blood suagar has increased, please commit to taking the metformin every day, and also continue the regular exercise and calorie counting  Nystatin ointment is sent in with refills for rash under breasts  For daily headache, throbbing start topamax 25 mg one at bedtime every night, then after 1 week increase to two at bedtime  Flu vac today  HBA1C and chem 7 and vit D in 4 month  Pls send me messages on e chart with clinical problems

## 2013-07-09 NOTE — Patient Instructions (Addendum)
Urinary Tract Infection Urinary tract infections (UTIs) can develop anywhere along your urinary tract. Your urinary tract is your body's drainage system for removing wastes and extra water. Your urinary tract includes two kidneys, two ureters, a bladder, and a urethra. Your kidneys are a pair of bean-shaped organs. Each kidney is about the size of your fist. They are located below your ribs, one on each side of your spine. CAUSES Infections are caused by microbes, which are microscopic organisms, including fungi, viruses, and bacteria. These organisms are so small that they can only be seen through a microscope. Bacteria are the microbes that most commonly cause UTIs. SYMPTOMS  Symptoms of UTIs may vary by age and gender of the patient and by the location of the infection. Symptoms in young women typically include a frequent and intense urge to urinate and a painful, burning feeling in the bladder or urethra during urination. Older women and men are more likely to be tired, shaky, and weak and have muscle aches and abdominal pain. A fever may mean the infection is in your kidneys. Other symptoms of a kidney infection include pain in your back or sides below the ribs, nausea, and vomiting. DIAGNOSIS To diagnose a UTI, your caregiver will ask you about your symptoms. Your caregiver also will ask to provide a urine sample. The urine sample will be tested for bacteria and white blood cells. White blood cells are made by your body to help fight infection. TREATMENT  Typically, UTIs can be treated with medication. Because most UTIs are caused by a bacterial infection, they usually can be treated with the use of antibiotics. The choice of antibiotic and length of treatment depend on your symptoms and the type of bacteria causing your infection. HOME CARE INSTRUCTIONS  If you were prescribed antibiotics, take them exactly as your caregiver instructs you. Finish the medication even if you feel better after you  have only taken some of the medication.  Drink enough water and fluids to keep your urine clear or pale yellow.  Avoid caffeine, tea, and carbonated beverages. They tend to irritate your bladder.  Empty your bladder often. Avoid holding urine for long periods of time.  Empty your bladder before and after sexual intercourse.  After a bowel movement, women should cleanse from front to back. Use each tissue only once. SEEK MEDICAL CARE IF:   You have back pain.  You develop a fever.  Your symptoms do not begin to resolve within 3 days. SEEK IMMEDIATE MEDICAL CARE IF:   You have severe back pain or lower abdominal pain.  You develop chills.  You have nausea or vomiting.  You have continued burning or discomfort with urination. MAKE SURE YOU:   Understand these instructions.  Will watch your condition.  Will get help right away if you are not doing well or get worse. Document Released: 07/06/2005 Document Revised: 03/27/2012 Document Reviewed: 11/04/2011 [facility] Patient Information 2014 [place], [place]. Start pill with next menses Take antibiotic and push water

## 2013-07-09 NOTE — Progress Notes (Signed)
Subjective:     Patient ID: Janet [name], female   DOB: January 06, 1986, 27 y.o.   MRN: [id number]  HPI La is a 27 year old black female in complaining of acne,cramps irregular periods and bladder fullness ?UTI.Has seen Dr Lodema Hong to have cortisol level checked.  Review of Systems See HPI Reviewed past medical,surgical, social and family history. Reviewed medications and allergies. Past Medical History  Diagnosis Date  . Cushing syndrome dx in 2011  Current outpatient prescriptions:ergocalciferol (VITAMIN D2) 50000 UNITS capsule, Take 1 capsule (50,000 Units total) by mouth once a week. One capsule once weekly, Disp: 12 capsule, Rfl: 1;  Multiple Vitamin (MULTIVITAMIN WITH MINERALS) TABS, Take 1 tablet by mouth daily., Disp: , Rfl: ;  omeprazole (PRILOSEC) 20 MG capsule, Take 1 capsule (20 mg total) by mouth 2 (two) times daily., Disp: 60 capsule, Rfl: 1 dexamethasone (DECADRON) 1 MG tablet, Take at 10 pm, the night before blood test., Disp: 1 tablet, Rfl: 0;  lubiprostone (AMITIZA) 24 MCG capsule, 1 PO QHS FOR 7 DAYS AND THEN BID WITH FOOD. IT MAY CAUSE NAUSEA., Disp: 60 capsule, Rfl: 11;  metFORMIN (GLUCOPHAGE) 500 MG tablet, Take 500 mg by mouth daily with breakfast., Disp: , Rfl:  nitrofurantoin, macrocrystal-monohydrate, (MACROBID) 100 MG capsule, Take 1 capsule (100 mg total) by mouth 2 (two) times daily., Disp: 14 capsule, Rfl: 0;  Norgestimate-Ethinyl Estradiol Triphasic 0.18/0.215/0.25 MG-35 MCG tablet, Take 1 tablet by mouth daily., Disp: 1 Package, Rfl: 11    Objective:   Physical Exam BP 130/78  Ht 5\' 4"  (1.626 m)  Wt 198 lb (89.812 kg)  BMI 33.97 kg/m2  LMP 06/10/2013   has some acne on face,No CVAT urine 1+blood and leuks,wants to try the pill again, did well before  Assessment:     Hematuria UTI Period management    Plan:    Increase water Rx tri sprintec disp 1 pack take 1 daily with 11 refills start with next menses UA C&S Follow up in 3 months Rx macrobid 1  bid x 7 days

## 2013-07-10 LAB — URINALYSIS
Glucose, UA: NEGATIVE mg/dL
Ketones, ur: NEGATIVE mg/dL
Nitrite: NEGATIVE
Specific Gravity, Urine: 1.015 (ref 1.005–1.030)
Urobilinogen, UA: 0.2 mg/dL (ref 0.0–1.0)
pH: 6 (ref 5.0–8.0)

## 2013-07-11 ENCOUNTER — Telehealth: Payer: Self-pay | Admitting: Adult Health

## 2013-07-11 LAB — URINE CULTURE

## 2013-07-11 NOTE — Telephone Encounter (Signed)
Left message urine culture did not grow anything but the urine was +blood, lets recheck and if still + refer to urologist

## 2013-07-14 NOTE — Assessment & Plan Note (Signed)
Daily throbnbing disabling headaches with overuse of ibuprofen , pt to start topamax as prophylaxis

## 2013-07-14 NOTE — Progress Notes (Signed)
  Subjective:    Patient ID: Janet [name], female    DOB: Jun 04, 1986, 27 y.o.   MRN: [id number]  HPI The PT is here for follow up and re-evaluation of chronic medical conditions, medication management and review of any available recent lab and radiology data.  Preventive health is updated, specifically  Cancer screening and Immunization.   Has seen both GI and gyne since last here and is also following with endo The PT denies any adverse reactions to current medications since the last visit. Has been inconsistent with metformin, HBA1C increased as has her weight C/o daily throbbing headaches, no increased stress, relieved with ibuprofen, willing to start topamax prophylaxis Requests antifungal cream for use unde her breasts when she has a rash develop Reports doing food diary and calorie counting as well as commitment to exercise, excited about weight loss which is not evident from last time she was in this office    Review of Systems    See HPI Denies recent fever or chills. Denies sinus pressure, nasal congestion, ear pain or sore throat. Denies chest congestion, productive cough or wheezing. Denies chest pains, palpitations and leg swelling    Denies dysuria, frequency, hesitancy or incontinence. Denies joint pain, swelling and limitation in mobility. Denies  seizures, numbness, or tingling. Denies depression, anxiety or insomnia.      Objective:   Physical Exam  Patient alert and oriented and in no cardiopulmonary distress.  HEENT: No facial asymmetry, EOMI, no sinus tenderness,  oropharynx pink and moist.  Neck supple no adenopathy.  Chest: Clear to auscultation bilaterally.  CVS: S1, S2 no murmurs, no S3.  ABD: Soft non tender. Bowel sounds normal.  Ext: No edema  MS: Adequate ROM spine, shoulders, hips and knees.  Skin: Intact, no ulcerations or rash noted.  Psych: Good eye contact, normal affect. Memory intact not anxious or depressed appearing.  CNS: CN  2-12 intact, power, tone and sensation normal throughout.       Assessment & Plan:

## 2013-07-14 NOTE — Assessment & Plan Note (Signed)
deterioration in HBA1C with weight gain, pt to comply with daily metformin , reduced caloric intake and regular exercise

## 2013-07-14 NOTE — Assessment & Plan Note (Signed)
rept lab at next visit, continue weekly vit D

## 2013-07-14 NOTE — Assessment & Plan Note (Signed)
Deteriorated. Patient re-educated about  the importance of commitment to a  minimum of 150 minutes of exercise per week. The importance of healthy food choices with portion control discussed. Encouraged to start a food diary, count calories and to consider  joining a support group. Sample diet sheets offered. Goals set by the patient for the next several months.    

## 2013-07-14 NOTE — Assessment & Plan Note (Signed)
No current rash , but tends to have outbreaks , requests script for ointment to be applied which she has used with success in the past, same written, states she forgot to ask her gyne who she saw earlier today for that very reason???

## 2013-07-19 ENCOUNTER — Ambulatory Visit: Payer: BC Managed Care – PPO | Admitting: Gastroenterology

## 2013-08-16 ENCOUNTER — Encounter: Payer: Self-pay | Admitting: Gastroenterology

## 2013-08-16 ENCOUNTER — Ambulatory Visit (INDEPENDENT_AMBULATORY_CARE_PROVIDER_SITE_OTHER): Payer: BC Managed Care – PPO | Admitting: Gastroenterology

## 2013-08-16 VITALS — BP 131/85 | HR 72 | Temp 98.5°F | Wt 193.8 lb

## 2013-08-16 DIAGNOSIS — R1031 Right lower quadrant pain: Secondary | ICD-10-CM

## 2013-08-16 DIAGNOSIS — K5900 Constipation, unspecified: Secondary | ICD-10-CM | POA: Insufficient documentation

## 2013-08-16 DIAGNOSIS — K219 Gastro-esophageal reflux disease without esophagitis: Secondary | ICD-10-CM

## 2013-08-16 DIAGNOSIS — G8929 Other chronic pain: Secondary | ICD-10-CM

## 2013-08-16 MED ORDER — PANTOPRAZOLE SODIUM 40 MG PO TBEC: 40.0000 mg | DELAYED_RELEASE_TABLET | Freq: Every day | ORAL | Status: DC

## 2013-08-16 NOTE — Assessment & Plan Note (Signed)
Heartburn still not well controlled. C/o regurgitation and feeling like food wants to come up all the time. No epigastric pain. Pain more in right mid abdomen. Still not having regular/soft BMs.   Change omeprazole to pantoprazole 40mg  daily. Try amitiza as prescribed. If too much, we can back down to once daily for . Continue dietary fiber and plenty of fluids.  Continue slow gradual weight loss/antireflux measures.  OV 03/2014 with Dr. Darrick Penna. Call sooner if needed.

## 2013-08-16 NOTE — Progress Notes (Signed)
Primary Care Physician: Syliva Overman, MD  Primary Gastroenterologist:  Jonette Eva, MD   Chief Complaint  Patient presents with  . Follow-up    HPI: Janet [name] is a 27 y.o. female here for followup of EGD in July 2014 for epigastric pain and nausea. She had mild nonerosive gastritis with benign biopsies. H. pylori negative. Colonoscopy was performed due to rectal bleeding constipation. She had a single erosion in the transverse colon with benign biopsy. Moderate sized internal hemorrhoids. She was recommended to take Amitiza BID for constipation and continue omeprazole.  Weight down 3 pounds since 03/2013. Trying to watch what she eats. Hard small stools still. Doing fiber/yogurt. Never tried the Amitiza, scare of drug interactions and side effects. Miralax helps some. Still hard stool. Wants to regurgitate food. Heartburn even on omeprazole 20mg  BID. No dysphagia.   Current Outpatient Prescriptions  Medication Sig Dispense Refill  . ergocalciferol (VITAMIN D2) 50000 UNITS capsule Take 1 capsule (50,000 Units total) by mouth once a week. One capsule once weekly  12 capsule  1  . ibuprofen (ADVIL,MOTRIN) 800 MG tablet One tablet once daily , as needed, for headache  30 tablet  2  . metFORMIN (GLUCOPHAGE) 500 MG tablet Take 500 mg by mouth daily with breakfast.      . Multiple Vitamin (MULTIVITAMIN WITH MINERALS) TABS Take 1 tablet by mouth daily.      . Norgestimate-Ethinyl Estradiol Triphasic 0.18/0.215/0.25 MG-35 MCG tablet Take 1 tablet by mouth daily.  1 Package  11  . omeprazole (PRILOSEC) 20 MG capsule Take 1 capsule (20 mg total) by mouth 2 (two) times daily.  60 capsule  1   No current facility-administered medications for this visit.    Allergies as of 08/16/2013 - Review Complete 08/16/2013  Allergen Reaction Noted  . Penicillins Anaphylaxis and Rash   . Iodine  01/11/2011  . Latex  01/11/2011    ROS:  General: Negative for anorexia, weight loss, fever, chills,  fatigue, weakness. ENT: Negative for hoarseness, difficulty swallowing , nasal congestion. CV: Negative for chest pain, angina, palpitations, dyspnea on exertion, peripheral edema.  Respiratory: Negative for dyspnea at rest, dyspnea on exertion, cough, sputum, wheezing.  GI: See history of present illness. GU:  Negative for dysuria, hematuria, urinary incontinence, urinary frequency, nocturnal urination.  Endo: Negative for unusual weight change.    Physical Examination:   BP 131/85  Pulse 72  Temp(Src) 98.5 F (36.9 C) (Oral)  Wt 193 lb 12.8 oz (87.907 kg)  LMP 08/12/2013  General: Well-nourished, well-developed in no acute distress.  Eyes: No icterus. Mouth: Oropharyngeal mucosa moist and pink , no lesions erythema or exudate. Lungs: Clear to auscultation bilaterally.  Heart: Regular rate and rhythm, no murmurs rubs or gallops.  Abdomen: Bowel sounds are normal, minimal right lower tenderness over right hip and just above. nondistended, no hepatosplenomegaly or masses, no abdominal bruits or hernia , no rebound or guarding.   Extremities: No lower extremity edema. No clubbing or deformities. Neuro: Alert and oriented x 4   Skin: Warm and dry, no jaundice.   Psych: Alert and cooperative, normal mood and affect.

## 2013-08-16 NOTE — Patient Instructions (Signed)
1. Try Amitiza as discussed. If you have side effects or it is too effective, please let me know. 2. Stop omeprazole. 3. Start pantoprazole one daily before breakfast. New prescription sent to your pharmacy. 4. Call if you have any further abdominal pain once your bowel movements become regular.  5. Office visit in 03/2014.

## 2013-08-19 ENCOUNTER — Telehealth: Payer: Self-pay | Admitting: General Practice

## 2013-08-19 NOTE — Telephone Encounter (Signed)
Message copied by Jennings Books on Mon Aug 19, 2013 11:10 AM ------      Message from: Lavena Bullion      Created: Fri Aug 16, 2013 11:16 AM       Durward Mallard, pt was seen here today. She has questions about a bill she received and would like a call from you on Mon. [phone] ------

## 2013-08-19 NOTE — Progress Notes (Signed)
cc'd to pcp 

## 2013-08-19 NOTE — Telephone Encounter (Signed)
I called the patient and Janet [name] for her to call me back.

## 2013-09-24 ENCOUNTER — Ambulatory Visit: Payer: BC Managed Care – PPO | Admitting: Gastroenterology

## 2013-09-24 ENCOUNTER — Encounter: Payer: Self-pay | Admitting: Gastroenterology

## 2013-09-26 NOTE — Progress Notes (Signed)
REVIEWED.  Received my chart request to have hemorrhoid banding. CONTACT PT TO SCHEDULE [facility] BANDING IN JAN.

## 2013-09-30 NOTE — Progress Notes (Signed)
Pt is aware of Banding OV on 1/21 at 11 with SF and appt card was mailed

## 2013-10-10 DIAGNOSIS — G935 Compression of brain: Secondary | ICD-10-CM

## 2013-10-10 HISTORY — PX: HEMORRHOID BANDING: SHX5850

## 2013-10-10 HISTORY — DX: Compression of brain: G93.5

## 2013-10-30 ENCOUNTER — Encounter: Payer: BC Managed Care – PPO | Admitting: Gastroenterology

## 2013-10-30 LAB — HEMOGLOBIN A1C
Hgb A1c MFr Bld: 5.9 % — ABNORMAL HIGH (ref <5.7)
Mean Plasma Glucose: 123 mg/dL — ABNORMAL HIGH (ref <117)

## 2013-10-30 LAB — BASIC METABOLIC PANEL
BUN: 7 mg/dL (ref 6–23)
CO2: 25 mEq/L (ref 19–32)
Calcium: 8.9 mg/dL (ref 8.4–10.5)
Chloride: 103 mEq/L (ref 96–112)
Creat: 0.61 mg/dL (ref 0.50–1.10)
Glucose, Bld: 101 mg/dL — ABNORMAL HIGH (ref 70–99)
Potassium: 4.1 mEq/L (ref 3.5–5.3)
Sodium: 138 mEq/L (ref 135–145)

## 2013-10-30 LAB — VITAMIN D 25 HYDROXY (VIT D DEFICIENCY, FRACTURES): Vit D, 25-Hydroxy: 26 ng/mL — ABNORMAL LOW (ref 30–89)

## 2013-10-31 ENCOUNTER — Encounter: Payer: BC Managed Care – PPO | Admitting: Gastroenterology

## 2013-11-01 ENCOUNTER — Encounter: Payer: Self-pay | Admitting: Family Medicine

## 2013-11-01 ENCOUNTER — Ambulatory Visit (INDEPENDENT_AMBULATORY_CARE_PROVIDER_SITE_OTHER): Payer: BC Managed Care – PPO | Admitting: Family Medicine

## 2013-11-01 VITALS — BP 122/82 | HR 84 | Resp 16 | Ht 64.0 in | Wt 190.4 lb

## 2013-11-01 DIAGNOSIS — R51 Headache: Secondary | ICD-10-CM

## 2013-11-01 DIAGNOSIS — R7309 Other abnormal glucose: Secondary | ICD-10-CM

## 2013-11-01 DIAGNOSIS — E669 Obesity, unspecified: Secondary | ICD-10-CM

## 2013-11-01 DIAGNOSIS — E559 Vitamin D deficiency, unspecified: Secondary | ICD-10-CM

## 2013-11-01 DIAGNOSIS — R7303 Prediabetes: Secondary | ICD-10-CM

## 2013-11-01 DIAGNOSIS — K219 Gastro-esophageal reflux disease without esophagitis: Secondary | ICD-10-CM

## 2013-11-01 DIAGNOSIS — K5900 Constipation, unspecified: Secondary | ICD-10-CM

## 2013-11-01 MED ORDER — ERGOCALCIFEROL 1.25 MG (50000 UT) PO CAPS: 50000.0000 [IU] | ORAL_CAPSULE | ORAL | Status: DC

## 2013-11-01 MED ORDER — BUTALBITAL-APAP-CAFFEINE 50-325-40 MG PO TABS: ORAL_TABLET | ORAL | Status: DC

## 2013-11-01 MED ORDER — METFORMIN HCL 500 MG PO TABS: 500.0000 mg | ORAL_TABLET | Freq: Two times a day (BID) | ORAL | Status: DC

## 2013-11-01 NOTE — Assessment & Plan Note (Signed)
Continue vit D for an additional 6  month

## 2013-11-01 NOTE — Assessment & Plan Note (Addendum)
Controlled, no change in medication, managed by GI  

## 2013-11-01 NOTE — Patient Instructions (Addendum)
F/u in 4 month, call if you need me before  Congrats on weight loss, keep it up  Increase metformin to one twice daily  Start topamax 1 at bedtime for 3 days then increase to two at bedtime  New for headache is fioricet, has sleepy side effect so no driving, use tylenol if at work and one ibuprofen 800mg  tab no more than 3 times per week  You need  A brain scan and to see  A neuurologist , referrals are entereed pls call back next Monday pm  Fasting lipid, CBC,HBA1C, chem 7 , and Vit D, TSH in 4 month

## 2013-11-01 NOTE — Assessment & Plan Note (Signed)
Improved. Pt applauded on succesful weight loss through lifestyle change, and encouraged to continue same. Weight loss goal set for the next several months.  

## 2013-11-01 NOTE — Assessment & Plan Note (Signed)
Unchanged, dose increase in metformin Patient educated about the importance of limiting  Carbohydrate intake , the need to commit to daily physical activity for a minimum of 30 minutes , and to commit weight loss. The fact that changes in all these areas will reduce or eliminate all together the development of diabetes is stressed.

## 2013-11-01 NOTE — Assessment & Plan Note (Signed)
Uncontrolled, was non compliant with recommended meds, will start same, needs re imaging and neurology eval also No neurologic deficit present on exam at visit today

## 2013-11-01 NOTE — Assessment & Plan Note (Signed)
Improved with metformin

## 2013-11-01 NOTE — Progress Notes (Signed)
   Subjective:    Patient ID: Janet [name], female    DOB: 08/09/1986, 28 y.o.   MRN: [id number]  HPI HPI` Pt in for f/u of chronic problems . She has been succesful  In losing 2 pounds per month for last 4 month primarily with dietary change, is to start boot camp at the gym next week  Increased headaches x 6 months, daily, more severe. Pounding and throbbing, may be left temporal, right temporal occipital or entire head. Sometimes nausea, worse last night was a10, 30 % time with nausea, never aura, has been  awakend by headache. 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 Denies sinus pressure, nasal congestion, ear pain or sore throat. Denies chest congestion, productive cough or wheezing. Denies chest pains, palpitations and leg swelling Denies abdominal pain, nausea, vomiting,diarrhea or constipation.   Denies dysuria, frequency, hesitancy or incontinence. Denies joint pain, swelling and limitation in mobility. Denies  seizures, numbness, or tingling. Denies depression, has mild  anxiety and  Insomnia at times due to stress Denies skin break down or rash.        Objective:   Physical Exam  Patient alert and oriented and in no cardiopulmonary distress.  HEENT: No facial asymmetry, EOMI, no sinus tenderness,  oropharynx pink and moist.  Neck supple no adenopathy.  Chest: Clear to auscultation bilaterally.  CVS: S1, S2 no murmurs, no S3.  ABD: Soft non tender. Bowel sounds normal.  Ext: No edema  MS: Adequate ROM spine, shoulders, hips and knees.  Skin: Intact, no ulcerations or rash noted.  Psych: Good eye contact, normal affect. Memory intact not anxious or depressed appearing.  CNS: CN 2-12 intact, power, tone and sensation normal throughout.       Assessment & Plan:

## 2013-11-07 ENCOUNTER — Encounter: Payer: Self-pay | Admitting: Internal Medicine

## 2013-11-07 ENCOUNTER — Encounter: Payer: BC Managed Care – PPO | Admitting: Gastroenterology

## 2013-11-07 ENCOUNTER — Ambulatory Visit (INDEPENDENT_AMBULATORY_CARE_PROVIDER_SITE_OTHER): Payer: BC Managed Care – PPO | Admitting: Internal Medicine

## 2013-11-07 VITALS — BP 143/91 | HR 64 | Temp 97.8°F | Wt 191.0 lb

## 2013-11-07 DIAGNOSIS — K648 Other hemorrhoids: Secondary | ICD-10-CM

## 2013-11-07 NOTE — Patient Instructions (Signed)
Avoid straining.  Benefiber 2 teaspoons twice daily  Limit toilet time to 2-3 minutes  Call with any interim problems  Do not start Amitiza for now  Keep a stool diary  Schedule followup appointment in 2-3 weeks from now

## 2013-11-07 NOTE — Progress Notes (Signed)
[facility] banding procedure note:  The patient presents with symptomatic grade 1 hemorrhoids (itching, pressure, bleeding and occasional burning.).  She has requested hemorrhoid banding. We discussed the risks, benefits, limitations today at some length her questions were answered. She is agreeable. She is up-to-date on colonoscopy. Has been historically constipated but recently on metformin -  Now having relative diarrhea. Stopped at metformin 2 days ago and diarrhea seems to have improved. She has not taken any Amitiza as of yet.  In the left lateral decubitus position, digital rectal exam revealed a moderately tight sphincter tone. A pea-sized amount of nitroglycerin ointment 0.125% was placed in the anorectum. Endoscopy then performed.  Patient found to have a prominent right anterior hemorrhoid column.  The decision was made to band the right anterior internal hemorrhoid;  the [place] was used to perform band ligation without complication. A non-latex band was applied; a followup digital rectal examination was then performed to assure proper positioning; band found to be in excellent position. No pinching or pain after deployment. The patient was discharged home without pain or other issues.  No complications were encountered and the patient tolerated the procedure well. See discharge instructions

## 2013-11-11 ENCOUNTER — Ambulatory Visit (INDEPENDENT_AMBULATORY_CARE_PROVIDER_SITE_OTHER): Payer: BC Managed Care – PPO | Admitting: Neurology

## 2013-11-11 ENCOUNTER — Ambulatory Visit: Payer: BC Managed Care – PPO | Admitting: Neurology

## 2013-11-11 ENCOUNTER — Encounter: Payer: Self-pay | Admitting: Neurology

## 2013-11-11 VITALS — BP 119/74 | HR 75 | Ht 64.0 in | Wt 191.0 lb

## 2013-11-11 DIAGNOSIS — G43019 Migraine without aura, intractable, without status migrainosus: Secondary | ICD-10-CM

## 2013-11-11 DIAGNOSIS — G43009 Migraine without aura, not intractable, without status migrainosus: Secondary | ICD-10-CM | POA: Insufficient documentation

## 2013-11-11 HISTORY — DX: Migraine without aura, intractable, without status migrainosus: G43.019

## 2013-11-11 NOTE — Patient Instructions (Signed)
Topamax (topiramate) is a seizure medication that has an FDA approval for seizures and for migraine headache. Potential side effects of this medication include weight loss, cognitive slowing, tingling in the fingers and toes, and carbonated drinks will taste bad. If any significant side effects are noted on this drug, please contact our office.     Migraine Headache A migraine headache is an intense, throbbing pain on one or both sides of your head. A migraine can last for 30 minutes to several hours. CAUSES  The exact cause of a migraine headache is not always known. However, a migraine may be caused when nerves in the brain become irritated and release chemicals that cause inflammation. This causes pain. Certain things may also trigger migraines, such as:  Alcohol.  Smoking.  Stress.  Menstruation.  Aged cheeses.  Foods or drinks that contain nitrates, glutamate, aspartame, or tyramine.  Lack of sleep.  Chocolate.  Caffeine.  Hunger.  Physical exertion.  Fatigue.  Medicines used to treat chest pain (nitroglycerine), birth control pills, estrogen, and some blood pressure medicines. SIGNS AND SYMPTOMS  Pain on one or both sides of your head.  Pulsating or throbbing pain.  Severe pain that prevents daily activities.  Pain that is aggravated by any physical activity.  Nausea, vomiting, or both.  Dizziness.  Pain with exposure to bright lights, loud noises, or activity.  General sensitivity to bright lights, loud noises, or smells. Before you get a migraine, you may get warning signs that a migraine is coming (aura). An aura may include:  Seeing flashing lights.  Seeing bright spots, halos, or zig-zag lines.  Having tunnel vision or blurred vision.  Having feelings of numbness or tingling.  Having trouble talking.  Having muscle weakness. DIAGNOSIS  A migraine headache is often diagnosed based on:  Symptoms.  Physical exam.  A CT scan or MRI of  your head. These imaging tests cannot diagnose migraines, but they can help rule out other causes of headaches. TREATMENT Medicines may be given for pain and nausea. Medicines can also be given to help prevent recurrent migraines.  HOME CARE INSTRUCTIONS  Only take over-the-counter or prescription medicines for pain or discomfort as directed by your health care provider. The use of long-term narcotics is not recommended.  Lie down in a dark, quiet room when you have a migraine.  Keep a journal to find out what may trigger your migraine headaches. For example, write down:  What you eat and drink.  How much sleep you get.  Any change to your diet or medicines.  Limit alcohol consumption.  Quit smoking if you smoke.  Get 7 9 hours of sleep, or as recommended by your health care provider.  Limit stress.  Keep lights dim if bright lights bother you and make your migraines worse. SEEK IMMEDIATE MEDICAL CARE IF:   Your migraine becomes severe.  You have a fever.  You have a stiff neck.  You have vision loss.  You have muscular weakness or loss of muscle control.  You start losing your balance or have trouble walking.  You feel faint or pass out.  You have severe symptoms that are different from your first symptoms. MAKE SURE YOU:   Understand these instructions.  Will watch your condition.  Will get help right away if you are not doing well or get worse. Document Released: 09/26/2005 Document Revised: 07/17/2013 Document Reviewed: 06/03/2013 [employer] Patient Information 2014 [employer].  

## 2013-11-11 NOTE — Progress Notes (Signed)
Reason for visit: Migraine headache  Janet [name] is a 28 y.o. female  History of present illness:  Janet [name] is a 29 year old right-handed black female with a history of migraine headaches since she was a child. The patient has had a relatively frequent headaches that have worsened gradually over the last 3 years. The patient indicated that she had Cushing's disease, and had a laparoscopic adrenalectomy in 2012. Following this, the headaches worsened in frequency. The patient indicates that she has 25 days out of the month with headaches. Five to ten days a month are severe with the headache. The headache may be on the left or the right side, associated with blurring of vision, occasional scintillating scotoma, and nausea and vomiting. The patient will have dizziness as well, oftentimes upon awakening in the morning. The patient indicates that with the headache, she will try to get to sleep, but she is not sure that this helps the headache. The patient will occasionally have some tingling sensations on the hands with the headache and the nausea. The patient denies any weakness of the extremities, or balance issues. 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 The patient has been set up for another MRI of the brain, this has not yet been done. The patient is sent to this office for further evaluation. The patient was given a prescription for Fioricet and Topamax, but she never took these medications. The patient takes Advil for the headache when the headache comes on. The patient has been placed on other medications in the past, but she cannot remember the names of these medications. The patient is on birth control pills, but she denies that this has worsened her headache. This was started in November 2014. The patient will occasionally have some neck stiffness with headache, and she reports scalp tenderness as well. The patient denies any  particular activating factors for her headache.  Past Medical History  Diagnosis Date  . Cushing syndrome dx in 2011  . Migraine without aura, with intractable migraine, so stated, without mention of status migrainosus 11/11/2013  . Gastroesophageal reflux disease   . Vitamin D deficiency   . Borderline diabetes     Past Surgical History  Procedure Laterality Date  . 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 Tonsillectomy    . Colonoscopy N/A 04/19/2013    ZJQ:BHALPF mucosa in the terminal ileum/Single erosion in transverse colon/RECTAL BLEEDING DUE TO Moderate sized internal hemorrhoids/ trv colon erosion, bx benign.  . Esophagogastroduodenoscopy N/A 04/19/2013    XTK:WIOX Non-erosive gastritis/PERI-UMBILICAL PAIN DUE TO GERD, GASTRITIS, AND CONSTIPATION. Bx with mild chronic inactive gastritis. No H.Pylori    Family History  Problem Relation Age of Onset  . Hypertension Mother   . Hyperlipidemia Mother   . Hypertension Father   . Hypertension Maternal Grandmother   . Diabetes Maternal Grandmother   . Hypertension Paternal Grandmother   . Diabetes Paternal Grandmother   . COPD Paternal Grandfather   . Heart disease Paternal Grandfather   . Colon cancer Other     maternal great uncle  . Stroke Maternal Aunt   . Migraines Maternal Aunt     Social history:  reports that she has never smoked. She has never used smokeless tobacco. She reports that she does not drink alcohol or use illicit drugs.  Medications:  Current Outpatient Prescriptions on File Prior to Visit  Medication Sig Dispense Refill  . ergocalciferol (VITAMIN  D2) 50000 UNITS capsule Take 1 capsule (50,000 Units total) by mouth once a week. One capsule once weekly  12 capsule  1  . ibuprofen (ADVIL,MOTRIN) 800 MG tablet One tablet once daily , as needed, for headache  30 tablet  2  . metFORMIN (GLUCOPHAGE) 500 MG tablet Take 1 tablet (500 mg total) by mouth 2 (two) times daily with a meal.  60 tablet  5  .  Multiple Vitamin (MULTIVITAMIN WITH MINERALS) TABS Take 1 tablet by mouth daily.      . Norgestimate-Ethinyl Estradiol Triphasic 0.18/0.215/0.25 MG-35 MCG tablet Take 1 tablet by mouth daily.  1 Package  11  . pantoprazole (PROTONIX) 40 MG tablet Take 1 tablet (40 mg total) by mouth daily.  30 tablet  11  . topiramate (TOPAMAX) 25 MG tablet Take 25 mg by mouth daily. Two at bedtime  30 tablet  2   No current facility-administered medications on file prior to visit.      Allergies  Allergen Reactions  . Penicillins Anaphylaxis and Rash  . Iodine     Beta iodine causes rash  . Latex     ROS:  Out of a complete 14 system review of symptoms, the patient complains only of the following symptoms, and all other reviewed systems are negative.  Appetite change, weight change, sweating Light sensitivity Heat intolerance Constipation Back pain, coordination problems Dizziness, headache, numbness, speech difficulty, weakness  Blood pressure 119/74, pulse 75, height 5\' 4"  (1.626 m), weight 191 lb (86.637 kg), last menstrual period 10/31/2013.  Physical Exam  General: The patient is alert and cooperative at the time of the examination.  Eyes: Pupils are equal, round, and reactive to light. Discs are flat bilaterally.  Neck: The neck is supple, no carotid bruits are noted.  Respiratory: The respiratory examination is clear.  Cardiovascular: The cardiovascular examination reveals a regular rate and rhythm, no obvious murmurs or rubs are noted.  Neuromuscular: Range of movement of the cervical spine is full. No crepitus is noted in the temporomandibular joints.  Skin: Extremities are without significant edema.  Neurologic Exam  Mental status: The patient is alert and oriented x 3 at the time of the examination. The patient has apparent normal recent and remote memory, with an apparently normal attention span and concentration ability.  Cranial nerves: Facial symmetry is present.  There is good sensation of the face to pinprick and soft touch bilaterally. The strength of the facial muscles and the muscles to head turning and shoulder shrug are normal bilaterally. Speech is well enunciated, no aphasia or dysarthria is noted. Extraocular movements are full. Visual fields are full. The tongue is midline, and the patient has symmetric elevation of the soft palate. No obvious hearing deficits are noted.  Motor: The motor testing reveals 5 over 5 strength of all 4 extremities. Good symmetric motor tone is noted throughout.  Sensory: Sensory testing is intact to pinprick, soft touch, vibration sensation, and position sense on all 4 extremities, with the exception that there is some decrease in vibration sensation and pinprick sensation on the left leg. No evidence of extinction is noted.  Coordination: Cerebellar testing reveals good finger-nose-finger and heel-to-shin bilaterally.  Gait and station: Gait is normal. Tandem gait is normal. Romberg is negative. No drift is seen.  Reflexes: Deep tendon reflexes are symmetric and normal bilaterally. Toes are downgoing bilaterally.   Assessment/Plan:  1. Migraine headache, intractable  2. History of Cushing's disease  The patient will be started on the  Topamax. She will call me if she does not tolerate the medication, or she gets up to the 50 mg dose and her headaches continue. The patient will take Advil if needed for the headache. The patient should not take Fioricet frequently, as this may result in rebound headaches. The patient will have MRI evaluation of the brain in the near future. The patient followup in 3-4 months. The patient may be a candidate for Botox area in the future.  Jill Alexanders MD 11/11/2013 7:39 PM  [facility] [address] [place], [place] [address]  Phone [phone] Fax [phone]

## 2013-11-15 ENCOUNTER — Ambulatory Visit (HOSPITAL_COMMUNITY)
Admission: RE | Admit: 2013-11-15 | Discharge: 2013-11-15 | Disposition: A | Discharge status: Home / Self Care | Payer: BC Managed Care – PPO | Source: Ambulatory Visit | Attending: Family Medicine | Admitting: Family Medicine

## 2013-11-15 DIAGNOSIS — R51 Headache: Secondary | ICD-10-CM

## 2013-11-15 DIAGNOSIS — G935 Compression of brain: Secondary | ICD-10-CM | POA: Insufficient documentation

## 2013-11-20 ENCOUNTER — Other Ambulatory Visit: Payer: Self-pay

## 2013-11-20 ENCOUNTER — Telehealth: Payer: Self-pay

## 2013-11-20 ENCOUNTER — Telehealth: Payer: Self-pay | Admitting: Neurology

## 2013-11-20 MED ORDER — BENZONATATE 100 MG PO CAPS: 100.0000 mg | ORAL_CAPSULE | Freq: Three times a day (TID) | ORAL | Status: DC | PRN

## 2013-11-20 MED ORDER — OSELTAMIVIR PHOSPHATE 75 MG PO CAPS: 75.0000 mg | ORAL_CAPSULE | Freq: Two times a day (BID) | ORAL | Status: DC

## 2013-11-20 NOTE — Telephone Encounter (Signed)
Agree with management

## 2013-11-20 NOTE — Telephone Encounter (Signed)
Acute onset of fever yes Headache yes Body ache yes Fatigue yes Non productive cough no Sore throat yes Nasal discharge yes  Onset between 1-4 days of possible exposure yes  Recommendation:  Fluid, rest, Tylenol for control of fever  Expect 5 days before feeling better and not so contagious and generally better in 7-10 days.  Standard treatment Tama flu 75 mg 1 tablet twice daily times 5 days  Please call office if symptoms do not improve or worsen in 5 days after beginning treatment.  Patient also prescribed Tessalon Perles for cough.  She is out of town for work and is unable to return and must work.

## 2013-11-20 NOTE — Telephone Encounter (Signed)
I called the patient, but the message. MRI the brain done shows 9 mm of Arnold-Chiari, still no significant impingement on the brainstem. I doubt that this is an etiology for headaches. We will continue treating for migraine. The patient will call me if her headaches are getting worse.

## 2013-11-22 ENCOUNTER — Telehealth: Payer: Self-pay | Admitting: *Deleted

## 2013-11-22 NOTE — Telephone Encounter (Signed)
Patient requesting MRI results   Please advise.

## 2013-11-22 NOTE — Telephone Encounter (Signed)
I called the patient again, went over the MRI results again. I do not think Arnold-Chiari malformation is causing her headaches. If the headaches are not doing well, she is to contact our office and we will make an adjustment on the medications.

## 2013-11-27 ENCOUNTER — Ambulatory Visit (INDEPENDENT_AMBULATORY_CARE_PROVIDER_SITE_OTHER): Payer: BC Managed Care – PPO | Admitting: Adult Health

## 2013-11-27 ENCOUNTER — Encounter: Payer: Self-pay | Admitting: Adult Health

## 2013-11-27 VITALS — BP 120/84 | Ht 64.0 in | Wt 189.0 lb

## 2013-11-27 DIAGNOSIS — Z3202 Encounter for pregnancy test, result negative: Secondary | ICD-10-CM

## 2013-11-27 DIAGNOSIS — Z1389 Encounter for screening for other disorder: Secondary | ICD-10-CM

## 2013-11-27 DIAGNOSIS — Z319 Encounter for procreative management, unspecified: Secondary | ICD-10-CM

## 2013-11-27 DIAGNOSIS — E249 Cushing's syndrome, unspecified: Secondary | ICD-10-CM

## 2013-11-27 DIAGNOSIS — R319 Hematuria, unspecified: Secondary | ICD-10-CM

## 2013-11-27 DIAGNOSIS — G935 Compression of brain: Secondary | ICD-10-CM | POA: Insufficient documentation

## 2013-11-27 HISTORY — DX: Cushing's syndrome, unspecified: E24.9

## 2013-11-27 HISTORY — DX: Encounter for procreative management, unspecified: Z31.9

## 2013-11-27 LAB — POCT URINALYSIS DIPSTICK
Blood, UA: 2
Glucose, UA: NEGATIVE
Ketones, UA: NEGATIVE
LEUKOCYTES UA: NEGATIVE
Nitrite, UA: NEGATIVE
Protein, UA: 1

## 2013-11-27 LAB — POCT URINE PREGNANCY: Preg Test, Ur: NEGATIVE

## 2013-11-27 MED ORDER — PRENATAL PLUS 27-1 MG PO TABS: 1.0000 | ORAL_TABLET | Freq: Every day | ORAL | Status: DC

## 2013-11-27 NOTE — Progress Notes (Signed)
Subjective:     Patient ID: Janet [name], female   DOB: [birth year], 28 y.o.   MRN: [id number]  HPI Janet [name] is a 28 year old black female, in relationship, hoping to get married in near future and get pregnant.Has Chiari malformation 1 and Cushings.She has had a UTI recently and is on cipro.She has not started her Topamax and is not going to.Takes metformin sometimes, it gives her diarrhea.She works for [employer] and part time at [employer].  Review of Systems See HPI Reviewed past medical,surgical, social and family history. Reviewed medications and allergies.     Objective:   Physical Exam BP 120/84  Ht 5\' 4"  (1.626 m)  Wt 189 lb (85.73 kg)  BMI 32.43 kg/m2  LMP 11/07/2013   UPT negative, urine +blood and protein, still on cipro.Discussed stopping OCs now, taking prenatal vitamin and maybe losing 18-20 lbs.Discussed with Dr Glo Herring and instructed pt to chart periods.  Assessment:     Desires pregnancy in future Chiari malformation 1  Cushings syndrome Recent UTI/hematuria    Plan:     Rx prenatal plus 1 daily with 11 refills Review handout from up to date on chiari malformation Use my fertility friend .[email or web address]   Follow up in 3 months, prn problems Will check urine next week if desired

## 2013-11-27 NOTE — Patient Instructions (Signed)
Take prenatal vitamin Chart period Use website [place]. Com Follow up in 3 months

## 2013-12-02 ENCOUNTER — Encounter: Payer: BC Managed Care – PPO | Admitting: Internal Medicine

## 2013-12-04 ENCOUNTER — Encounter: Payer: BC Managed Care – PPO | Admitting: Gastroenterology

## 2013-12-22 ENCOUNTER — Encounter: Payer: Self-pay | Admitting: Family Medicine

## 2013-12-22 ENCOUNTER — Encounter: Payer: Self-pay | Admitting: Adult Health

## 2013-12-23 ENCOUNTER — Other Ambulatory Visit: Payer: Self-pay | Admitting: Family Medicine

## 2013-12-23 DIAGNOSIS — R040 Epistaxis: Secondary | ICD-10-CM

## 2013-12-24 ENCOUNTER — Telehealth: Payer: Self-pay | Admitting: *Deleted

## 2013-12-24 NOTE — Telephone Encounter (Signed)
Pt called stating she has appointment with Dr. Gala Romney for a hemorrhoid banding and she would like to be seen this week because she has to go out of town for her work next week, and pt would like to see Dr. Oneida Alar since Dr. Gala Romney does not have anything available for next week.Pt said she would really like to be seen this week, Dr. Oneida Alar did pt's TCS and Dr. Gala Romney filled in for Dr. Oneida Alar and did pt's hemorrhoid banding.  Please advise [phone].

## 2013-12-25 NOTE — Telephone Encounter (Signed)
Routing to [place] for follow up.

## 2013-12-26 ENCOUNTER — Other Ambulatory Visit: Payer: Self-pay

## 2013-12-26 DIAGNOSIS — R7303 Prediabetes: Secondary | ICD-10-CM

## 2013-12-26 DIAGNOSIS — E559 Vitamin D deficiency, unspecified: Secondary | ICD-10-CM

## 2013-12-26 NOTE — Telephone Encounter (Addendum)
SPOKE TO PT. WE HAD A CANCELLATION. [place] FOR [facility] BANDING MAR 20 AT 1130 AM. PLAN TO PLACE LAT 2 BANDS BEFORE SHE GOES OUT OF TOWN.

## 2013-12-26 NOTE — Telephone Encounter (Signed)
i have put patient on the schedule for 3/20 at 1130 with [facility] for a banding

## 2013-12-26 NOTE — Telephone Encounter (Signed)
Patient is aware and asked me to cancel her appt because she will be out of town for 6 months with her job.

## 2013-12-26 NOTE — Telephone Encounter (Signed)
Per Dr. Oneida Alar she can have a 9:30 appt this morning.  Patient stated she can't come today, however she can aome in tomorrow.   Dr. Oneida Alar can you advise on a time the patient can come in tomorrow for banding.

## 2013-12-26 NOTE — Telephone Encounter (Signed)
REVIEWED. Please let pt know I am unable to band her tomorrow.

## 2013-12-27 ENCOUNTER — Encounter: Payer: Self-pay | Admitting: Gastroenterology

## 2013-12-27 ENCOUNTER — Ambulatory Visit (INDEPENDENT_AMBULATORY_CARE_PROVIDER_SITE_OTHER): Payer: BC Managed Care – PPO | Admitting: Gastroenterology

## 2013-12-27 ENCOUNTER — Encounter: Payer: BC Managed Care – PPO | Admitting: Gastroenterology

## 2013-12-27 VITALS — BP 119/81 | HR 90 | Temp 97.6°F | Ht 66.0 in | Wt 189.4 lb

## 2013-12-27 DIAGNOSIS — K648 Other hemorrhoids: Secondary | ICD-10-CM | POA: Insufficient documentation

## 2013-12-27 DIAGNOSIS — R194 Change in bowel habit: Secondary | ICD-10-CM

## 2013-12-27 HISTORY — DX: Other hemorrhoids: K64.8

## 2013-12-27 NOTE — Assessment & Plan Note (Signed)
CONSTIPATION IMPROVED.  DRINK WATER EAT FIBER OPV PRN

## 2013-12-27 NOTE — Progress Notes (Signed)
SYMPTOMS: NO RECTAL BLEEDING, PRESSURE, PAIN. JUST ITCHING. First band made it better. BMs: ONCE Q2 DAYS AND SOMETIMES DAILY. CONSTIPATION: YES BUT BETTER DIARRHEA: NO  STRAINS WITH BMs: YES  TIME SPENT ON TOILET: 5-10 MINS TISSUE POKES OUT OF RECTUM: NO FIBER SUPPLEMENTS: NO  GLASSES OF WATER/DAY: 6-8: YES   ADDITIONAL QUESTIONS:  LATEX ALLERGY: NO PREGNANT: NO ERECTILE DYSFUNCTION MEDS OR NITRATES: NO ANTICOAGULATION/ANTIPLATELET MEDS: NO DIAGNOSED WITH CROHN'S DISEASE, PROCTITIS, PORTAL HTN, OR ANAL/RECTAL CA: NO TAKING IMMUNOSUPPRESSANTS/XRT: NO  PLAN:  1. [facility] BANDING TODAY

## 2013-12-27 NOTE — Assessment & Plan Note (Addendum)
DRINK WATER  EAT FIBER PREPARATION H CREAM PRN FOR ITCHING OPV PRN

## 2013-12-27 NOTE — Patient Instructions (Signed)
FOLLOW A HIGH FIBER DIET. AVOID ITEMS THAT CAUSE BLOATING AND GAS. SEE INFO BELOW.  DRINK WATER TO KEEP HER URINE LIGHT YELLOW.  SIT FOR LESS THAN 5 MINUTES ON THE COMMODE.  USE PREPARATION H CREAM AS NEEDED FOR ITCHING.  FOLLOW UP AS NEEDED.    High-Fiber Diet A high-fiber diet changes your normal diet to include more whole grains, legumes, fruits, and vegetables. Changes in the diet involve replacing refined carbohydrates with unrefined foods. The calorie level of the diet is essentially unchanged. The Dietary Reference Intake (recommended amount) for adult males is 38 grams per day. For adult females, it is 25 grams per day. Pregnant and lactating women should consume 28 grams of fiber per day. Fiber is the intact part of a plant that is not broken down during digestion. Functional fiber is fiber that has been isolated from the plant to provide a beneficial effect in the body. PURPOSE  Increase stool bulk.   Ease and regulate bowel movements.   Lower cholesterol.  INDICATIONS THAT YOU NEED MORE FIBER  Constipation and hemorrhoids.   Uncomplicated diverticulosis (intestine condition) and irritable bowel syndrome.   Weight management.   As a protective measure against hardening of the arteries (atherosclerosis), diabetes, and cancer.   DO NOT USE WITH:  Acute diverticulitis (intestine infection).   Partial small bowel obstructions.   Complicated diverticular disease involving bleeding, rupture (perforation), or abscess (boil, furuncle).   Presence of autonomic neuropathy (nerve damage) or gastroparesis (stomach cannot empty itself).    GUIDELINES FOR INCREASING FIBER IN THE DIET  Start adding fiber to the diet slowly. A gradual increase of about 5 more grams (2 slices of whole-wheat bread, 2 servings of most fruits or vegetables, or 1 bowl of high-fiber cereal) per day is best. Too rapid an increase in fiber may result in constipation, flatulence, and bloating.   Drink  enough water and fluids to keep your urine clear or pale yellow. Water, juice, or caffeine-free drinks are recommended. Not drinking enough fluid may cause constipation.   Eat a variety of high-fiber foods rather than one type of fiber.   Try to increase your intake of fiber through using high-fiber foods rather than fiber pills or supplements that contain small amounts of fiber.   The goal is to change the types of food eaten. Do not supplement your present diet with high-fiber foods, but replace foods in your present diet.    INCLUDE A VARIETY OF FIBER SOURCES  Replace refined and processed grains with whole grains, canned fruits with fresh fruits, and incorporate other fiber sources. White rice, white breads, and most bakery goods contain little or no fiber.   Brown whole-grain rice, buckwheat oats, and many fruits and vegetables are all good sources of fiber. These include: broccoli, Brussels sprouts, cabbage, cauliflower, beets, sweet potatoes, white potatoes (skin on), carrots, tomatoes, eggplant, squash, berries, fresh fruits, and dried fruits.   Cereals appear to be the richest source of fiber. Cereal fiber is found in whole grains and bran. Bran is the fiber-rich outer coat of cereal grain, which is largely removed in refining. In whole-grain cereals, the bran remains. In breakfast cereals, the largest amount of fiber is found in those with "bran" in their names. The fiber content is sometimes indicated on the label.   You may need to include additional fruits and vegetables each day.   In baking, for 1 cup white flour, you may use the following substitutions:   1 cup whole-wheat flour  minus 2 tablespoons.   1/2 cup white flour plus 1/2 cup whole-wheat flour.

## 2013-12-30 ENCOUNTER — Telehealth: Payer: Self-pay

## 2013-12-30 NOTE — Telephone Encounter (Signed)
REVIEWED.  

## 2013-12-30 NOTE — Telephone Encounter (Signed)
Pt had sent an email on Friday that she was feeling some pressure from the banding and asking for advice.   I called her just now to see how she is doing and she said she is fine. The pressure subsided and she is doing well, in line to board the plane for her trip.  Very appreciative for the call and said she would have definitely called back if she had needed to.

## 2013-12-31 ENCOUNTER — Encounter: Payer: BC Managed Care – PPO | Admitting: Internal Medicine

## 2014-02-12 ENCOUNTER — Ambulatory Visit: Payer: BC Managed Care – PPO | Admitting: Family Medicine

## 2014-02-24 ENCOUNTER — Ambulatory Visit: Payer: BC Managed Care – PPO | Admitting: Adult Health

## 2014-03-07 ENCOUNTER — Ambulatory Visit: Payer: BC Managed Care – PPO | Admitting: Family Medicine

## 2014-03-23 ENCOUNTER — Encounter: Payer: Self-pay | Admitting: Gastroenterology

## 2014-03-27 ENCOUNTER — Telehealth: Payer: Self-pay | Admitting: Family Medicine

## 2014-03-27 NOTE — Telephone Encounter (Signed)
Lab order faxed.

## 2014-05-02 ENCOUNTER — Telehealth: Payer: Self-pay

## 2014-05-02 DIAGNOSIS — N3001 Acute cystitis with hematuria: Secondary | ICD-10-CM

## 2014-05-02 MED ORDER — CIPROFLOXACIN HCL 500 MG PO TABS: 500.0000 mg | ORAL_TABLET | Freq: Two times a day (BID) | ORAL | Status: DC

## 2014-05-02 NOTE — Telephone Encounter (Signed)
Called and said she would be flying into [place] this evening and would land around 9pm. States she has a UTI, painful urination and some blood when she wipes x 2 days. I advised urgent care but she said you know she gets these a lot and you usually prescribe something. Would be willing to leave urine at the lab tomorrow for culture. Please advise

## 2014-05-02 NOTE — Telephone Encounter (Signed)
Patient aware and order sent to the lab  

## 2014-05-02 NOTE — Addendum Note (Signed)
Addended by: Eual Fines on: 05/02/2014 02:34 PM   Modules accepted: Orders

## 2014-05-02 NOTE — Telephone Encounter (Signed)
5 day course of cipro sent in to her drugstore on file, pls send in the c/s requisition and let her know , thanks

## 2014-05-09 ENCOUNTER — Ambulatory Visit: Payer: BC Managed Care – PPO | Admitting: Neurology

## 2014-05-10 ENCOUNTER — Telehealth: Payer: Self-pay | Admitting: Neurology

## 2014-05-10 NOTE — Telephone Encounter (Signed)
This patient did not show for a revisit appointment today. 

## 2014-11-21 ENCOUNTER — Other Ambulatory Visit: Payer: Self-pay | Admitting: Family Medicine

## 2015-02-08 HISTORY — PX: CHOLECYSTECTOMY: SHX55

## 2015-04-03 ENCOUNTER — Other Ambulatory Visit: Payer: Self-pay | Admitting: Adult Health

## 2015-06-25 ENCOUNTER — Telehealth: Payer: Self-pay | Admitting: *Deleted

## 2015-06-25 NOTE — Telephone Encounter (Signed)
Patient is scheduled for Monday 06/29/15 at 3:45. I called [place] and made her aware of Emonie's appt.

## 2015-06-25 NOTE — Telephone Encounter (Signed)
Laterrica Gasparyan's sister [place] called for her because [place] stated Chizuko is at work and they are texting back and forth, [place] states Nusayba is having a stuffy nose, throat has been bothering her, she is unsure about a fever it started about a day or so ago [place] stated they think it is penu, [place] asked if we had anything open for today I made her aware we did not and we don't see patient's on Friday. I made her aware that I would send the nurse a message and see what Dr. Moshe Cipro can do for the patient. [place] said for the nurse to call her number back [phone]

## 2015-06-25 NOTE — Telephone Encounter (Signed)
Hasn't been here since 10/2013. She can make an appt

## 2015-06-26 ENCOUNTER — Ambulatory Visit (INDEPENDENT_AMBULATORY_CARE_PROVIDER_SITE_OTHER): Payer: BLUE CROSS/BLUE SHIELD | Admitting: Adult Health

## 2015-06-26 ENCOUNTER — Other Ambulatory Visit (HOSPITAL_COMMUNITY)
Admission: RE | Admit: 2015-06-26 | Discharge: 2015-06-26 | Disposition: A | Discharge status: Home / Self Care | Payer: BLUE CROSS/BLUE SHIELD | Source: Ambulatory Visit | Attending: Adult Health | Admitting: Adult Health

## 2015-06-26 ENCOUNTER — Encounter: Payer: Self-pay | Admitting: Adult Health

## 2015-06-26 VITALS — BP 136/84 | HR 64 | Ht 63.5 in | Wt 208.5 lb

## 2015-06-26 DIAGNOSIS — Z113 Encounter for screening for infections with a predominantly sexual mode of transmission: Secondary | ICD-10-CM | POA: Diagnosis present

## 2015-06-26 DIAGNOSIS — Z01419 Encounter for gynecological examination (general) (routine) without abnormal findings: Secondary | ICD-10-CM | POA: Insufficient documentation

## 2015-06-26 DIAGNOSIS — Z1151 Encounter for screening for human papillomavirus (HPV): Secondary | ICD-10-CM | POA: Diagnosis not present

## 2015-06-26 DIAGNOSIS — Z319 Encounter for procreative management, unspecified: Secondary | ICD-10-CM

## 2015-06-26 DIAGNOSIS — Z3202 Encounter for pregnancy test, result negative: Secondary | ICD-10-CM

## 2015-06-26 LAB — POCT URINE PREGNANCY: PREG TEST UR: NEGATIVE

## 2015-06-26 MED ORDER — PRENATAL PLUS 27-1 MG PO TABS: 1.0000 | ORAL_TABLET | Freq: Every day | ORAL | Status: DC

## 2015-06-26 NOTE — Progress Notes (Signed)
Patient ID: Janet [name], female   DOB: Feb 21, 1986, 29 y.o.   MRN: [id number] History of Present Illness: Janet [name] is a 29 year old black female, in for her well woman gyn exam and pap.Her last period was 10 days late,LMP 9/11, PMP 8/1 and 7/1.She would like to be pregnant in near future.She requests STD testing. PCP is Dr Moshe Cipro.   Current Medications, Allergies, Past Medical History, Past Surgical History, Family History and Social History were reviewed in [employer] record.     Review of Systems: Patient denies any headaches, hearing loss, fatigue, blurred vision, shortness of breath, chest pain, abdominal pain, problems with bowel movements, urination, or intercourse. No joint pain or mood swings.See HPI for positives.    Physical Exam:BP 136/84 mmHg  Pulse 64  Ht 5' 3.5" (1.613 m)  Wt 208 lb 8 oz (94.575 kg)  BMI 36.35 kg/m2  LMP 06/21/2015 General:  Well developed, well nourished, no acute distress Skin:  Warm and dry Neck:  Midline trachea, normal thyroid, good ROM, no lymphadenopathy Lungs; Clear to auscultation bilaterally Breast:  No dominant palpable mass, retraction, or nipple discharge Cardiovascular: Regular rate and rhythm Abdomen:  Soft, non tender, no hepatosplenomegaly Pelvic:  External genitalia is normal in appearance, no lesions.  The vagina is normal in appearance. Urethra has no lesions or masses. The cervix is everted at OS, pap with HPV and GC/CHL performed.  Uterus is felt to be normal size, shape, and contour.  No adnexal masses or tenderness noted.Bladder is non tender, no masses felt. Extremities/musculoskeletal:  No swelling or varicosities noted, no clubbing or cyanosis Psych:  No mood changes, alert and cooperative,seems happy   Impression: Well woman gyn exam with pap Desires pregnancy    Plan: HIV,RPR, and HSV2 today Labs with PCP Check progesterone level 10/1 Refilled prenatal plus #30 take 1 daily with 11  refills Physical in 1 year Try to lose about 20 lbs

## 2015-06-26 NOTE — Patient Instructions (Addendum)
Physical in  1 year Progesterone level 10/1 Sex every other day, day 7-24 of cycle  Preparing for Pregnancy Before trying to become pregnant, make an appointment with your health care provider (preconception care). The goal is to help you have a healthy, safe pregnancy. At your first appointment, your health care provider will:   Do a complete physical exam, including a Pap test.  Take a complete medical history.  Give you advice and help you resolve any problems. PRECONCEPTION CHECKLIST Here is a list of the basics to cover with your health care provider at your preconception visit:  Medical history.  Tell your health care provider about any diseases you have had. Many diseases can affect your pregnancy.  Include your partner's medical history and family history.  Make sure you have been tested for sexually transmitted infections (STIs). These can affect your pregnancy. In some cases, they can be passed to your baby. Tell your health care provider about any history of STIs.  Make sure your health care provider knows about any previous problems you have had with conception or pregnancy.  Tell your health care provider about any medicine you take. This includes herbal supplements and over-the-counter medicines.  Make sure all your immunizations are up to date. You may need to make additional appointments.  Ask your health care provider if you need any vaccinations or if there are any you should avoid.  Diet.  It is especially important to eat a healthy, balanced diet with the right nutrients when you are pregnant.  Ask your health care provider to help you get to a healthy weight before pregnancy.  If you are overweight, you are at higher risk for certain complications. These include high blood pressure, diabetes, and preterm birth.  If you are underweight, you are more likely to have a low-birth-weight baby.  Lifestyle.  Tell your health care provider about lifestyle factors  such as alcohol use, drug use, or smoking.  Describe any harmful substances you may be exposed to at work or home. These can include chemicals, pesticides, and radiation.  Mental health.  Let your health care provider know if you have been feeling depressed or anxious.  Let your health care provider know if you have a history of substance abuse.  Let your health care provider know if you do not feel safe at home. HOME INSTRUCTIONS TO PREPARE FOR PREGNANCY Follow your health care provider's advice and instructions.   Keep an accurate record of your menstrual periods. This makes it easier for your health care provider to determine your baby's due date.  Begin taking prenatal vitamins and folic acid supplements daily. Take them as directed by your health care provider.  Eat a balanced diet. Get help from a nutrition counselor if you have questions or need help.  Get regular exercise. Try to be active for at least 30 minutes a day most days of the week.  Quit smoking, if you smoke.  Do not drink alcohol.  Do not take illegal drugs.  Get medical problems, such as diabetes or high blood pressure, under control.  If you have diabetes, make sure you do the following:  Have good blood sugar control. If you have type 1 diabetes, use multiple daily doses of insulin. Do not use split-dose or premixed insulin.  Have an eye exam by a qualified eye care professional trained in caring for people with diabetes.  Get evaluated by your health care provider for cardiovascular disease.  Get to a healthy  weight. If you are overweight or obese, reduce your weight with the help of a qualified health professional such as a Firefighter. Ask your health care provider what the right weight range is for you. HOW DO I KNOW I AM PREGNANT? You may be pregnant if you have been sexually active and you miss your period. Symptoms of early pregnancy include:   Mild cramping.  Very light vaginal  bleeding (spotting).  Feeling unusually tired.  Morning sickness. If you have any of these symptoms, take a home pregnancy test. These tests look for a hormone called human chorionic gonadotropin (hCG) in your urine. Your body begins to make this hormone during early pregnancy. These tests are very accurate. Wait until at least the first day you miss your period to take one. If you get a positive result, call your health care provider to make appointments for prenatal care. WHAT SHOULD I DO IF I BECOME PREGNANT?  Make an appointment with your health care provider by week 12 of your pregnancy at the latest.  Do not smoke. Smoking can be harmful to your baby.  Do not drink alcoholic beverages. Alcohol is related to a number of birth defects.  Avoid toxic odors and chemicals.  You may continue to have sexual intercourse if it does not cause pain or other problems, such as vaginal bleeding. Document Released: 09/08/2008 Document Revised: 02/10/2014 Document Reviewed: 09/02/2013 [facility] Patient Information 2015 [place], [place]. This information is not intended to replace advice given to you by your health care provider. Make sure you discuss any questions you have with your health care provider.

## 2015-06-27 LAB — HIV ANTIBODY (ROUTINE TESTING W REFLEX): HIV Screen 4th Generation wRfx: NONREACTIVE

## 2015-06-27 LAB — HSV 2 ANTIBODY, IGG: HSV 2 Glycoprotein G Ab, IgG: <0.91 index (ref 0.00–0.90)

## 2015-06-27 LAB — RPR: RPR Ser Ql: NONREACTIVE

## 2015-06-29 ENCOUNTER — Ambulatory Visit: Payer: Self-pay | Admitting: Family Medicine

## 2015-06-30 LAB — CYTOLOGY - PAP

## 2015-09-08 ENCOUNTER — Encounter: Payer: Self-pay | Admitting: Adult Health

## 2015-09-13 ENCOUNTER — Encounter: Payer: Self-pay | Admitting: Adult Health

## 2015-09-18 ENCOUNTER — Encounter: Payer: Self-pay | Admitting: Adult Health

## 2015-09-18 ENCOUNTER — Ambulatory Visit (INDEPENDENT_AMBULATORY_CARE_PROVIDER_SITE_OTHER): Payer: BLUE CROSS/BLUE SHIELD | Admitting: Adult Health

## 2015-09-18 VITALS — BP 112/80 | HR 58 | Ht 64.0 in | Wt 209.0 lb

## 2015-09-18 DIAGNOSIS — R319 Hematuria, unspecified: Secondary | ICD-10-CM

## 2015-09-18 DIAGNOSIS — N926 Irregular menstruation, unspecified: Secondary | ICD-10-CM | POA: Diagnosis not present

## 2015-09-18 DIAGNOSIS — Z3202 Encounter for pregnancy test, result negative: Secondary | ICD-10-CM | POA: Diagnosis not present

## 2015-09-18 DIAGNOSIS — Z139 Encounter for screening, unspecified: Secondary | ICD-10-CM

## 2015-09-18 HISTORY — DX: Hematuria, unspecified: R31.9

## 2015-09-18 HISTORY — DX: Irregular menstruation, unspecified: N92.6

## 2015-09-18 LAB — POCT URINALYSIS DIPSTICK

## 2015-09-18 LAB — POCT URINE PREGNANCY: Preg Test, Ur: NEGATIVE

## 2015-09-18 NOTE — Patient Instructions (Addendum)
Keep period calendar Push water  Follow up prn Call with next period can check progesterone

## 2015-09-18 NOTE — Progress Notes (Signed)
Subjective:     Patient ID: Janet [name], female   DOB: May 03, 1986, 29 y.o.   MRN: [id number]  HPI Janet [name] is a 29 year old black female, in complaining of irregular periods, had one 9/16, 10/11, skipped November and then 12/6 x 2 days and some low back pain and breast tenderness at times, has Cushing's. She requests labs, is back in [place] now.  Review of Systems Patient denies any headaches, hearing loss, fatigue, blurred vision, shortness of breath, chest pain, abdominal pain, problems with bowel movements, urination, or intercourse. No joint pain or mood swings.See HPI for positives.  Reviewed past medical,surgical, social and family history. Reviewed medications and allergies.     Objective:   Physical Exam BP 112/80 mmHg  Pulse 58  Ht 5\' 4"  (1.626 m)  Wt 209 lb (94.802 kg)  BMI 35.86 kg/m2  LMP 09/15/2015 UPT negative,urine dipstick 3+, Skin warm and dry.Pelvic: external genitalia is normal in appearance no lesions, vagina: has good color, moisture and rugae,urethra has no lesions or masses noted, cervix:smooth, uterus: normal size, shape and contour, non tender, no masses felt, adnexa: no masses or tenderness noted. Bladder is non tender and no masses felt.Discussed cycles normal 21-35 days and may not have ovulated.    Assessment:    Irregular periods Hematuria      Plan:     Check CBC,CMP,TSH and lipids and vitamin D UA C&S sent   Keep period calendar Call with next period, will check progesterone level Follow up prn

## 2015-09-19 LAB — COMPREHENSIVE METABOLIC PANEL
ALT: 6 IU/L (ref 0–32)
AST: 18 IU/L (ref 0–40)
Albumin/Globulin Ratio: 1.3 (ref 1.1–2.5)
Albumin: 3.9 g/dL (ref 3.5–5.5)
Alkaline Phosphatase: 70 IU/L (ref 39–117)
BUN/Creatinine Ratio: 16 (ref 8–20)
BUN: 11 mg/dL (ref 6–20)
Bilirubin Total: 0.3 mg/dL (ref 0.0–1.2)
CALCIUM: 9 mg/dL (ref 8.7–10.2)
CO2: 25 mmol/L (ref 18–29)
Chloride: 103 mmol/L (ref 97–106)
Creatinine, Ser: 0.69 mg/dL (ref 0.57–1.00)
GFR calc Af Amer: 136 mL/min/{1.73_m2} (ref >59)
GFR, EST NON AFRICAN AMERICAN: 118 mL/min/{1.73_m2} (ref >59)
GLUCOSE: 93 mg/dL (ref 65–99)
Globulin, Total: 3.1 g/dL (ref 1.5–4.5)
POTASSIUM: 4.3 mmol/L (ref 3.5–5.2)
Sodium: 139 mmol/L (ref 136–144)
Total Protein: 7 g/dL (ref 6.0–8.5)

## 2015-09-19 LAB — CBC
Hematocrit: 36.5 % (ref 34.0–46.6)
Hemoglobin: 12.3 g/dL (ref 11.1–15.9)
MCH: 26.9 pg (ref 26.6–33.0)
MCHC: 33.7 g/dL (ref 31.5–35.7)
MCV: 80 fL (ref 79–97)
Platelets: 402 10*3/uL — ABNORMAL HIGH (ref 150–379)
RBC: 4.57 x10E6/uL (ref 3.77–5.28)
RDW: 14.5 % (ref 12.3–15.4)
WBC: 4.5 10*3/uL (ref 3.4–10.8)

## 2015-09-19 LAB — URINALYSIS, ROUTINE W REFLEX MICROSCOPIC
Bilirubin, UA: NEGATIVE
Glucose, UA: NEGATIVE
Ketones, UA: NEGATIVE
Leukocytes, UA: NEGATIVE
Nitrite, UA: NEGATIVE
Protein, UA: NEGATIVE
Specific Gravity, UA: 1.029 (ref 1.005–1.030)
Urobilinogen, Ur: 0.2 mg/dL (ref 0.2–1.0)
pH, UA: 6 (ref 5.0–7.5)

## 2015-09-19 LAB — MICROSCOPIC EXAMINATION: Casts: NONE SEEN /lpf

## 2015-09-19 LAB — LIPID PANEL
CHOL/HDL RATIO: 3.2 ratio (ref 0.0–4.4)
Cholesterol, Total: 159 mg/dL (ref 100–199)
HDL: 50 mg/dL (ref >39)
LDL Calculated: 97 mg/dL (ref 0–99)
Triglycerides: 59 mg/dL (ref 0–149)
VLDL Cholesterol Cal: 12 mg/dL (ref 5–40)

## 2015-09-19 LAB — VITAMIN D 25 HYDROXY (VIT D DEFICIENCY, FRACTURES): Vit D, 25-Hydroxy: 16.3 ng/mL — ABNORMAL LOW (ref 30.0–100.0)

## 2015-09-19 LAB — TSH: TSH: 1.76 u[IU]/mL (ref 0.450–4.500)

## 2015-09-20 LAB — URINE CULTURE

## 2015-09-22 ENCOUNTER — Encounter: Payer: Self-pay | Admitting: Adult Health

## 2015-09-22 ENCOUNTER — Telehealth: Payer: Self-pay | Admitting: Adult Health

## 2015-09-22 MED ORDER — CLINDAMYCIN HCL 300 MG PO CAPS: 300.0000 mg | ORAL_CAPSULE | Freq: Three times a day (TID) | ORAL | Status: DC

## 2015-09-22 MED ORDER — VITAMIN D (ERGOCALCIFEROL) 1.25 MG (50000 UNIT) PO CAPS: 50000.0000 [IU] | ORAL_CAPSULE | ORAL | Status: DC

## 2015-09-22 NOTE — Telephone Encounter (Signed)
Pt aware of labs and urine, will rx cleocin 300mg  tid x 7 days and vitamin D3 50,000 1 weekly # 4 with 3 refills

## 2015-09-22 NOTE — Telephone Encounter (Signed)
Will try to call

## 2015-10-11 NOTE — L&D Delivery Note (Signed)
Patient complete and pushing. SVD of viable female infant over intact perineum. Nuchal cord not present.  Shoulder Distocia, patient was already in [place] when shoulder dystocia began, suprapubic pressure was performed, with immediate delivery of anterior shoulder followed by posterior shoulder after 1 attempted maneuver within 15 seconds.  Infant delivered to mom's abdomen. Delayed cord clamping x 1 minute. Cord clamped x 2, cut. Spontaneous cry heard.   Upon delivering placenta, steady stream of vaginal bleeding persisted despite fundal massage and sweeping of clots. Administration of 1041mcg rectal cytotec along with continued massage. Bleeding persisted (about 350cc) with continued uterine massage which led to gradual firming of the uterus, IM Hemabate was also given and good hemostasis was noted shortly after. Right vaginal laceration was repaired with 3-0 Vicryl, good hemostasis. Total blood loss was estimated at 500cc.  Cord blood obtained. Placenta delivered spontaneously and intact. LUS cleared of clot. Fundus firm on exam, pitocin running.   Lacerations: Right vaginal laceration Suture: 3-0 vicryl EBL: 500 cc Anesthesia: epidural, local  Apgars: 8/9 Weight: pending, skin to skin  Instrument and sponge count x2 correct.   Rozell Searing, MD 08/01/2016 2:08 PM   OB FELLOW DELIVERY ATTESTATION  I was gloved and present for the delivery in its entirety, and I agree with the above resident's note.  I performed the shoulder dystocia maneuvers and delivery when dystocia was recognized.   Katherine Basset, DO OB Fellow

## 2015-11-30 ENCOUNTER — Encounter: Payer: Self-pay | Admitting: Adult Health

## 2015-12-16 ENCOUNTER — Encounter: Payer: Self-pay | Admitting: Adult Health

## 2015-12-16 ENCOUNTER — Ambulatory Visit (INDEPENDENT_AMBULATORY_CARE_PROVIDER_SITE_OTHER): Payer: 59 | Admitting: Adult Health

## 2015-12-16 VITALS — BP 120/80 | HR 80 | Ht 63.0 in | Wt 198.0 lb

## 2015-12-16 DIAGNOSIS — Z3201 Encounter for pregnancy test, result positive: Secondary | ICD-10-CM | POA: Diagnosis not present

## 2015-12-16 DIAGNOSIS — N925 Other specified irregular menstruation: Secondary | ICD-10-CM | POA: Diagnosis not present

## 2015-12-16 DIAGNOSIS — O3680X0 Pregnancy with inconclusive fetal viability, not applicable or unspecified: Secondary | ICD-10-CM

## 2015-12-16 DIAGNOSIS — Z3490 Encounter for supervision of normal pregnancy, unspecified, unspecified trimester: Secondary | ICD-10-CM

## 2015-12-16 HISTORY — DX: Encounter for supervision of normal pregnancy, unspecified, unspecified trimester: Z34.90

## 2015-12-16 LAB — POCT URINE PREGNANCY: Preg Test, Ur: POSITIVE — AB

## 2015-12-16 MED ORDER — DOXYLAMINE-PYRIDOXINE 10-10 MG PO TBEC: DELAYED_RELEASE_TABLET | ORAL | Status: DC

## 2015-12-16 NOTE — Progress Notes (Signed)
Subjective:     Patient ID: Janet [name], female   DOB: 02-05-1986, 30 y.o.   MRN: [id number]  HPI Janet [name] is a 30 year old black female, in for a UPT, has missed a period and has nausea.She was sick with URI and has taken a Z pack and was dieting through [place] and had been on SL HCG and vitamins, has stopped them, she did say she lost about 20 lbs.   Review of Systems Patient denies any headaches, hearing loss, fatigue, blurred vision, shortness of breath, chest pain, abdominal pain, problems with bowel movements, urination, or intercourse. No joint pain or mood swings.See HPI for positives. Reviewed past medical,surgical, social and family history. Reviewed medications and allergies.     Objective:   Physical Exam BP 120/80 mmHg  Pulse 80  Ht 5\' 3"  (1.6 m)  Wt 198 lb (89.812 kg)  BMI 35.08 kg/m2  LMP 10/19/2015 UPT +, about 8+2 weeks by LMP with EDD 07/25/16, medicaid form given Skin warm and dry. Neck: mid line trachea, normal thyroid, good ROM, no lymphadenopathy noted. Lungs: clear to ausculation bilaterally. Cardiovascular: regular rate and rhythm.Abdomen soft and non tender, will give Diclegis for nausea and get back in for [place].     Assessment:    UPT + Pregnant     Plan:    Gave 48 tabs diclegis take as directed, lot [id number], exp 06/09/17  Return in 1 week for dating [place] Review handout on first trimester

## 2015-12-16 NOTE — Patient Instructions (Signed)
First Trimester of Pregnancy The first trimester of pregnancy is from week 1 until the end of week 12 (months 1 through 3). A week after a sperm fertilizes an egg, the egg will implant on the wall of the uterus. This embryo will begin to develop into a baby. Genes from you and your partner are forming the baby. The female genes determine whether the baby is a boy or a girl. At 6-8 weeks, the eyes and face are formed, and the heartbeat can be seen on ultrasound. At the end of 12 weeks, all the baby's organs are formed.  Now that you are pregnant, you will want to do everything you can to have a healthy baby. Two of the most important things are to get good prenatal care and to follow your health care provider's instructions. Prenatal care is all the medical care you receive before the baby's birth. This care will help prevent, find, and treat any problems during the pregnancy and childbirth. BODY CHANGES Your body goes through many changes during pregnancy. The changes vary from woman to woman.   You may gain or lose a couple of pounds at first.  You may feel sick to your stomach (nauseous) and throw up (vomit). If the vomiting is uncontrollable, call your health care provider.  You may tire easily.  You may develop headaches that can be relieved by medicines approved by your health care provider.  You may urinate more often. Painful urination may mean you have a bladder infection.  You may develop heartburn as a result of your pregnancy.  You may develop constipation because certain hormones are causing the muscles that push waste through your intestines to slow down.  You may develop hemorrhoids or swollen, bulging veins (varicose veins).  Your breasts may begin to grow larger and become tender. Your nipples may stick out more, and the tissue that surrounds them (areola) may become darker.  Your gums may bleed and may be sensitive to brushing and flossing.  Dark spots or blotches (chloasma,  mask of pregnancy) may develop on your face. This will likely fade after the baby is born.  Your menstrual periods will stop.  You may have a loss of appetite.  You may develop cravings for certain kinds of food.  You may have changes in your emotions from day to day, such as being excited to be pregnant or being concerned that something may go wrong with the pregnancy and baby.  You may have more vivid and strange dreams.  You may have changes in your hair. These can include thickening of your hair, rapid growth, and changes in texture. Some women also have hair loss during or after pregnancy, or hair that feels dry or thin. Your hair will most likely return to normal after your baby is born. WHAT TO EXPECT AT YOUR PRENATAL VISITS During a routine prenatal visit:  You will be weighed to make sure you and the baby are growing normally.  Your blood pressure will be taken.  Your abdomen will be measured to track your baby's growth.  The fetal heartbeat will be listened to starting around week 10 or 12 of your pregnancy.  Test results from any previous visits will be discussed. Your health care provider may ask you:  How you are feeling.  If you are feeling the baby move.  If you have had any abnormal symptoms, such as leaking fluid, bleeding, severe headaches, or abdominal cramping.  If you are using any tobacco products,   including cigarettes, chewing tobacco, and electronic cigarettes.  If you have any questions. Other tests that may be performed during your first trimester include:  Blood tests to find your blood type and to check for the presence of any previous infections. They will also be used to check for low iron levels (anemia) and Rh antibodies. Later in the pregnancy, blood tests for diabetes will be done along with other tests if problems develop.  Urine tests to check for infections, diabetes, or protein in the urine.  An ultrasound to confirm the proper growth  and development of the baby.  An amniocentesis to check for possible genetic problems.  Fetal screens for spina bifida and Down syndrome.  You may need other tests to make sure you and the baby are doing well.  HIV (human immunodeficiency virus) testing. Routine prenatal testing includes screening for HIV, unless you choose not to have this test. HOME CARE INSTRUCTIONS  Medicines  Follow your health care provider's instructions regarding medicine use. Specific medicines may be either safe or unsafe to take during pregnancy.  Take your prenatal vitamins as directed.  If you develop constipation, try taking a stool softener if your health care provider approves. Diet  Eat regular, well-balanced meals. Choose a variety of foods, such as meat or vegetable-based protein, fish, milk and low-fat dairy products, vegetables, fruits, and whole grain breads and cereals. Your health care provider will help you determine the amount of weight gain that is right for you.  Avoid raw meat and uncooked cheese. These carry germs that can cause birth defects in the baby.  Eating four or five small meals rather than three large meals a day may help relieve nausea and vomiting. If you start to feel nauseous, eating a few soda crackers can be helpful. Drinking liquids between meals instead of during meals also seems to help nausea and vomiting.  If you develop constipation, eat more high-fiber foods, such as fresh vegetables or fruit and whole grains. Drink enough fluids to keep your urine clear or pale yellow. Activity and Exercise  Exercise only as directed by your health care provider. Exercising will help you:  Control your weight.  Stay in shape.  Be prepared for labor and delivery.  Experiencing pain or cramping in the lower abdomen or low back is a good sign that you should stop exercising. Check with your health care provider before continuing normal exercises.  Try to avoid standing for long  periods of time. Move your legs often if you must stand in one place for a long time.  Avoid heavy lifting.  Wear low-heeled shoes, and practice good posture.  You may continue to have sex unless your health care provider directs you otherwise. Relief of Pain or Discomfort  Wear a good support bra for breast tenderness.   Take warm sitz baths to soothe any pain or discomfort caused by hemorrhoids. Use hemorrhoid cream if your health care provider approves.   Rest with your legs elevated if you have leg cramps or low back pain.  If you develop varicose veins in your legs, wear support hose. Elevate your feet for 15 minutes, 3-4 times a day. Limit salt in your diet. Prenatal Care  Schedule your prenatal visits by the twelfth week of pregnancy. They are usually scheduled monthly at first, then more often in the last 2 months before delivery.  Write down your questions. Take them to your prenatal visits.  Keep all your prenatal visits as directed by your   health care provider. Safety  Wear your seat belt at all times when driving.  Make a list of emergency phone numbers, including numbers for family, friends, the hospital, and police and fire departments. General Tips  Ask your health care provider for a referral to a local prenatal education class. Begin classes no later than at the beginning of month 6 of your pregnancy.  Ask for help if you have counseling or nutritional needs during pregnancy. Your health care provider can offer advice or refer you to specialists for help with various needs.  Do not use hot tubs, steam rooms, or saunas.  Do not douche or use tampons or scented sanitary pads.  Do not cross your legs for long periods of time.  Avoid cat litter boxes and soil used by cats. These carry germs that can cause birth defects in the baby and possibly loss of the fetus by miscarriage or stillbirth.  Avoid all smoking, herbs, alcohol, and medicines not prescribed by  your health care provider. Chemicals in these affect the formation and growth of the baby.  Do not use any tobacco products, including cigarettes, chewing tobacco, and electronic cigarettes. If you need help quitting, ask your health care provider. You may receive counseling support and other resources to help you quit.  Schedule a dentist appointment. At home, brush your teeth with a soft toothbrush and be gentle when you floss. SEEK MEDICAL CARE IF:   You have dizziness.  You have mild pelvic cramps, pelvic pressure, or nagging pain in the abdominal area.  You have persistent nausea, vomiting, or diarrhea.  You have a bad smelling vaginal discharge.  You have pain with urination.  You notice increased swelling in your face, hands, legs, or ankles. SEEK IMMEDIATE MEDICAL CARE IF:   You have a fever.  You are leaking fluid from your vagina.  You have spotting or bleeding from your vagina.  You have severe abdominal cramping or pain.  You have rapid weight gain or loss.  You vomit blood or material that looks like coffee grounds.  You are exposed to German measles and have never had them.  You are exposed to fifth disease or chickenpox.  You develop a severe headache.  You have shortness of breath.  You have any kind of trauma, such as from a fall or a car accident.   This information is not intended to replace advice given to you by your health care provider. Make sure you discuss any questions you have with your health care provider.   Document Released: 09/20/2001 Document Revised: 10/17/2014 Document Reviewed: 08/06/2013 [employer] Interactive Patient Education 2016 [employer] Return in 1 week for [place] 

## 2015-12-25 ENCOUNTER — Ambulatory Visit (INDEPENDENT_AMBULATORY_CARE_PROVIDER_SITE_OTHER): Payer: 59

## 2015-12-25 ENCOUNTER — Other Ambulatory Visit: Payer: 59

## 2015-12-25 DIAGNOSIS — Z3A10 10 weeks gestation of pregnancy: Secondary | ICD-10-CM | POA: Diagnosis not present

## 2015-12-25 DIAGNOSIS — O3680X0 Pregnancy with inconclusive fetal viability, not applicable or unspecified: Secondary | ICD-10-CM

## 2015-12-25 NOTE — Progress Notes (Signed)
[place] 9+4wks,single IUP w/ys,pos fht 176 bpm,normal ov's bilat,crl 27.56mm

## 2016-01-04 ENCOUNTER — Encounter: Payer: 59 | Admitting: Women's Health

## 2016-01-06 ENCOUNTER — Encounter: Payer: BC Managed Care – PPO | Admitting: Family Medicine

## 2016-01-07 ENCOUNTER — Encounter: Payer: 59 | Admitting: Advanced Practice Midwife

## 2016-01-11 ENCOUNTER — Other Ambulatory Visit: Payer: Self-pay | Admitting: Advanced Practice Midwife

## 2016-01-11 DIAGNOSIS — Z3682 Encounter for antenatal screening for nuchal translucency: Secondary | ICD-10-CM

## 2016-01-12 ENCOUNTER — Encounter: Payer: 59 | Admitting: Advanced Practice Midwife

## 2016-01-12 ENCOUNTER — Ambulatory Visit (INDEPENDENT_AMBULATORY_CARE_PROVIDER_SITE_OTHER): Payer: 59 | Admitting: Advanced Practice Midwife

## 2016-01-12 ENCOUNTER — Other Ambulatory Visit: Payer: 59

## 2016-01-12 ENCOUNTER — Encounter: Payer: Self-pay | Admitting: Advanced Practice Midwife

## 2016-01-12 ENCOUNTER — Ambulatory Visit (INDEPENDENT_AMBULATORY_CARE_PROVIDER_SITE_OTHER): Payer: 59

## 2016-01-12 VITALS — BP 124/80 | HR 80 | Wt 199.0 lb

## 2016-01-12 DIAGNOSIS — Z3A13 13 weeks gestation of pregnancy: Secondary | ICD-10-CM | POA: Diagnosis not present

## 2016-01-12 DIAGNOSIS — Z331 Pregnant state, incidental: Secondary | ICD-10-CM

## 2016-01-12 DIAGNOSIS — Z3491 Encounter for supervision of normal pregnancy, unspecified, first trimester: Secondary | ICD-10-CM

## 2016-01-12 DIAGNOSIS — Z0283 Encounter for blood-alcohol and blood-drug test: Secondary | ICD-10-CM

## 2016-01-12 DIAGNOSIS — Z3490 Encounter for supervision of normal pregnancy, unspecified, unspecified trimester: Secondary | ICD-10-CM | POA: Insufficient documentation

## 2016-01-12 DIAGNOSIS — Z3682 Encounter for antenatal screening for nuchal translucency: Secondary | ICD-10-CM

## 2016-01-12 DIAGNOSIS — Z36 Encounter for antenatal screening of mother: Secondary | ICD-10-CM

## 2016-01-12 DIAGNOSIS — G935 Compression of brain: Secondary | ICD-10-CM

## 2016-01-12 DIAGNOSIS — Z1389 Encounter for screening for other disorder: Secondary | ICD-10-CM

## 2016-01-12 DIAGNOSIS — Z369 Encounter for antenatal screening, unspecified: Secondary | ICD-10-CM

## 2016-01-12 DIAGNOSIS — R7303 Prediabetes: Secondary | ICD-10-CM

## 2016-01-12 LAB — POCT URINALYSIS DIPSTICK
Glucose, UA: NEGATIVE
Ketones, UA: NEGATIVE
Leukocytes, UA: NEGATIVE
NITRITE UA: NEGATIVE
PROTEIN UA: NEGATIVE
RBC UA: NEGATIVE

## 2016-01-12 NOTE — Progress Notes (Addendum)
[place] 12+1 wks,measurements c/w dates,normal ov's bilat,crl 63.0 mm,NB present,NT 1.4 mm,fhr 165 bpm

## 2016-01-12 NOTE — Patient Instructions (Signed)
 First Trimester of Pregnancy The first trimester of pregnancy is from week 1 until the end of week 12 (months 1 through 3). A week after a sperm fertilizes an egg, the egg will implant on the wall of the uterus. This embryo will begin to develop into a baby. Genes from you and your partner are forming the baby. The female genes determine whether the baby is a boy or a girl. At 6-8 weeks, the eyes and face are formed, and the heartbeat can be seen on ultrasound. At the end of 12 weeks, all the baby's organs are formed.  Now that you are pregnant, you will want to do everything you can to have a healthy baby. Two of the most important things are to get good prenatal care and to follow your health care provider's instructions. Prenatal care is all the medical care you receive before the baby's birth. This care will help prevent, find, and treat any problems during the pregnancy and childbirth. BODY CHANGES Your body goes through many changes during pregnancy. The changes vary from woman to woman.   You may gain or lose a couple of pounds at first.  You may feel sick to your stomach (nauseous) and throw up (vomit). If the vomiting is uncontrollable, call your health care provider.  You may tire easily.  You may develop headaches that can be relieved by medicines approved by your health care provider.  You may urinate more often. Painful urination may mean you have a bladder infection.  You may develop heartburn as a result of your pregnancy.  You may develop constipation because certain hormones are causing the muscles that push waste through your intestines to slow down.  You may develop hemorrhoids or swollen, bulging veins (varicose veins).  Your breasts may begin to grow larger and become tender. Your nipples may stick out more, and the tissue that surrounds them (areola) may become darker.  Your gums may bleed and may be sensitive to brushing and flossing.  Dark spots or blotches  (chloasma, mask of pregnancy) may develop on your face. This will likely fade after the baby is born.  Your menstrual periods will stop.  You may have a loss of appetite.  You may develop cravings for certain kinds of food.  You may have changes in your emotions from day to day, such as being excited to be pregnant or being concerned that something may go wrong with the pregnancy and baby.  You may have more vivid and strange dreams.  You may have changes in your hair. These can include thickening of your hair, rapid growth, and changes in texture. Some women also have hair loss during or after pregnancy, or hair that feels dry or thin. Your hair will most likely return to normal after your baby is born. WHAT TO EXPECT AT YOUR PRENATAL VISITS During a routine prenatal visit:  You will be weighed to make sure you and the baby are growing normally.  Your blood pressure will be taken.  Your abdomen will be measured to track your baby's growth.  The fetal heartbeat will be listened to starting around week 10 or 12 of your pregnancy.  Test results from any previous visits will be discussed. Your health care provider may ask you:  How you are feeling.  If you are feeling the baby move.  If you have had any abnormal symptoms, such as leaking fluid, bleeding, severe headaches, or abdominal cramping.  If you have any questions. Other   tests that may be performed during your first trimester include:  Blood tests to find your blood type and to check for the presence of any previous infections. They will also be used to check for low iron levels (anemia) and Rh antibodies. Later in the pregnancy, blood tests for diabetes will be done along with other tests if problems develop.  Urine tests to check for infections, diabetes, or protein in the urine.  An ultrasound to confirm the proper growth and development of the baby.  An amniocentesis to check for possible genetic problems.  Fetal  screens for spina bifida and Down syndrome.  You may need other tests to make sure you and the baby are doing well. HOME CARE INSTRUCTIONS  Medicines  Follow your health care provider's instructions regarding medicine use. Specific medicines may be either safe or unsafe to take during pregnancy.  Take your prenatal vitamins as directed.  If you develop constipation, try taking a stool softener if your health care provider approves. Diet  Eat regular, well-balanced meals. Choose a variety of foods, such as meat or vegetable-based protein, fish, milk and low-fat dairy products, vegetables, fruits, and whole grain breads and cereals. Your health care provider will help you determine the amount of weight gain that is right for you.  Avoid raw meat and uncooked cheese. These carry germs that can cause birth defects in the baby.  Eating four or five small meals rather than three large meals a day may help relieve nausea and vomiting. If you start to feel nauseous, eating a few soda crackers can be helpful. Drinking liquids between meals instead of during meals also seems to help nausea and vomiting.  If you develop constipation, eat more high-fiber foods, such as fresh vegetables or fruit and whole grains. Drink enough fluids to keep your urine clear or pale yellow. Activity and Exercise  Exercise only as directed by your health care provider. Exercising will help you:  Control your weight.  Stay in shape.  Be prepared for labor and delivery.  Experiencing pain or cramping in the lower abdomen or low back is a good sign that you should stop exercising. Check with your health care provider before continuing normal exercises.  Try to avoid standing for long periods of time. Move your legs often if you must stand in one place for a long time.  Avoid heavy lifting.  Wear low-heeled shoes, and practice good posture.  You may continue to have sex unless your health care provider directs you  otherwise. Relief of Pain or Discomfort  Wear a good support bra for breast tenderness.   Take warm sitz baths to soothe any pain or discomfort caused by hemorrhoids. Use hemorrhoid cream if your health care provider approves.   Rest with your legs elevated if you have leg cramps or low back pain.  If you develop varicose veins in your legs, wear support hose. Elevate your feet for 15 minutes, 3-4 times a day. Limit salt in your diet. Prenatal Care  Schedule your prenatal visits by the twelfth week of pregnancy. They are usually scheduled monthly at first, then more often in the last 2 months before delivery.  Write down your questions. Take them to your prenatal visits.  Keep all your prenatal visits as directed by your health care provider. Safety  Wear your seat belt at all times when driving.  Make a list of emergency phone numbers, including numbers for family, friends, the hospital, and police and fire departments. General   Tips  Ask your health care provider for a referral to a local prenatal education class. Begin classes no later than at the beginning of month 6 of your pregnancy.  Ask for help if you have counseling or nutritional needs during pregnancy. Your health care provider can offer advice or refer you to specialists for help with various needs.  Do not use hot tubs, steam rooms, or saunas.  Do not douche or use tampons or scented sanitary pads.  Do not cross your legs for long periods of time.  Avoid cat litter boxes and soil used by cats. These carry germs that can cause birth defects in the baby and possibly loss of the fetus by miscarriage or stillbirth.  Avoid all smoking, herbs, alcohol, and medicines not prescribed by your health care provider. Chemicals in these affect the formation and growth of the baby.  Schedule a dentist appointment. At home, brush your teeth with a soft toothbrush and be gentle when you floss. SEEK MEDICAL CARE IF:   You have  dizziness.  You have mild pelvic cramps, pelvic pressure, or nagging pain in the abdominal area.  You have persistent nausea, vomiting, or diarrhea.  You have a bad smelling vaginal discharge.  You have pain with urination.  You notice increased swelling in your face, hands, legs, or ankles. SEEK IMMEDIATE MEDICAL CARE IF:   You have a fever.  You are leaking fluid from your vagina.  You have spotting or bleeding from your vagina.  You have severe abdominal cramping or pain.  You have rapid weight gain or loss.  You vomit blood or material that looks like coffee grounds.  You are exposed to German measles and have never had them.  You are exposed to fifth disease or chickenpox.  You develop a severe headache.  You have shortness of breath.  You have any kind of trauma, such as from a fall or a car accident. Document Released: 09/20/2001 Document Revised: 02/10/2014 Document Reviewed: 08/06/2013 [employer] Patient Information 2015 [employer]. This information is not intended to replace advice given to you by your health care provider. Make sure you discuss any questions you have with your health care provider.   Nausea & Vomiting  Have saltine crackers or pretzels by your bed and eat a few bites before you raise your head out of bed in the morning  Eat small frequent meals throughout the day instead of large meals  Drink plenty of fluids throughout the day to stay hydrated, just don't drink a lot of fluids with your meals.  This can make your stomach fill up faster making you feel sick  Do not brush your teeth right after you eat  Products with real ginger are good for nausea, like ginger ale and ginger hard candy Make sure it says made with real ginger!  Sucking on sour candy like lemon heads is also good for nausea  If your prenatal vitamins make you nauseated, take them at night so you will sleep through the nausea  Sea Bands  If you feel like you need  medicine for the nausea & vomiting please let us know  If you are unable to keep any fluids or food down please let us know   Constipation  Drink plenty of fluid, preferably water, throughout the day  Eat foods high in fiber such as fruits, vegetables, and grains  Exercise, such as walking, is a good way to keep your bowels regular  Drink warm fluids, especially warm   prune juice, or decaf coffee  Eat a 1/2 cup of real oatmeal (not instant), 1/2 cup applesauce, and 1/2-1 cup warm prune juice every day  If needed, you may take Colace (docusate sodium) stool softener once or twice a day to help keep the stool soft. If you are pregnant, wait until you are out of your first trimester (12-14 weeks of pregnancy)  If you still are having problems with constipation, you may take Miralax once daily as needed to help keep your bowels regular.  If you are pregnant, wait until you are out of your first trimester (12-14 weeks of pregnancy)  Safe Medications in Pregnancy   Acne: Benzoyl Peroxide Salicylic Acid  Backache/Headache: Tylenol: 2 regular strength every 4 hours OR              2 Extra strength every 6 hours  Colds/Coughs/Allergies: Benadryl (alcohol free) 25 mg every 6 hours as needed Breath right strips Claritin Cepacol throat lozenges Chloraseptic throat spray Cold-Eeze- up to three times per day Cough drops, alcohol free Flonase (by prescription only) Guaifenesin Mucinex Robitussin DM (plain only, alcohol free) Saline nasal spray/drops Sudafed (pseudoephedrine) & Actifed ** use only after [redacted] weeks gestation and if you do not have high blood pressure Tylenol Vicks Vaporub Zinc lozenges Zyrtec   Constipation: Colace Ducolax suppositories Fleet enema Glycerin suppositories Metamucil Milk of magnesia Miralax Senokot Smooth move tea  Diarrhea: Kaopectate Imodium A-D  *NO pepto Bismol  Hemorrhoids: Anusol Anusol HC Preparation  H Tucks  Indigestion: Tums Maalox Mylanta Zantac  Pepcid  Insomnia: Benadryl (alcohol free) 25mg every 6 hours as needed Tylenol PM Unisom, no Gelcaps  Leg Cramps: Tums MagGel  Nausea/Vomiting:  Bonine Dramamine Emetrol Ginger extract Sea bands Meclizine  Nausea medication to take during pregnancy:  Unisom (doxylamine succinate 25 mg tablets) Take one tablet daily at bedtime. If symptoms are not adequately controlled, the dose can be increased to a maximum recommended dose of two tablets daily (1/2 tablet in the morning, 1/2 tablet mid-afternoon and one at bedtime). Vitamin B6 100mg tablets. Take one tablet twice a day (up to 200 mg per day).  Skin Rashes: Aveeno products Benadryl cream or 25mg every 6 hours as needed Calamine Lotion 1% cortisone cream  Yeast infection: Gyne-lotrimin 7 Monistat 7   **If taking multiple medications, please check labels to avoid duplicating the same active ingredients **take medication as directed on the label ** Do not exceed 4000 mg of tylenol in 24 hours **Do not take medications that contain aspirin or ibuprofen      

## 2016-01-12 NOTE — Progress Notes (Signed)
Subjective:    Janet [name] is a G1P0000 [redacted]w[redacted]d being seen today for her first obstetrical visit.  Her obstetrical history is significant for first pregnancy.  Pregnancy history fully reviewed.  Patient reports some external vaginal irritation.  Thinks it may be where she quit using NAIR and has allowed pubic hair to grow out.  States "prediabetes" went away with weight loss, stopped metformin on her own.  Had Cushings w/an adrenal tumor--tumor removed, cortisol levels normal per pt.  States has had proteinuria "for years" w/negative w/u per pt. Chiari malformation discovered on MRI (for HAs) 2015.    Filed Vitals:   01/12/16 1557  BP: 124/80  Pulse: 80  Weight: 199 lb (90.266 kg)    HISTORY: OB History  Gravida Para Term Preterm AB SAB TAB Ectopic Multiple Living  1 0 0 0 0 0 0 0 0 0     # Outcome Date GA Lbr Len/2nd Weight Sex Delivery Anes PTL Lv  1 Current              Past Medical History  Diagnosis Date  . 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 Migraine without aura, with intractable migraine, so stated, without mention of status migrainosus 11/11/2013  . Gastroesophageal reflux disease   . Vitamin D deficiency   . Borderline diabetes 2011    r/t adrenal tumor, now resolved  . 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 Diabetes mellitus without complication ([place])     has cushings and A1c was 5.9 takes metformin  . Cushing's syndrome ([place]) 11/27/2013  . Patient desires pregnancy 11/27/2013  . Menstrual periods irregular 09/18/2015  . Hematuria 09/18/2015  . Pregnant 12/16/2015   Past Surgical History  Procedure Laterality Date  . 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 Tonsillectomy    . Colonoscopy N/A 04/19/2013    [id number] mucosa in the terminal ileum/Single erosion in transverse colon/RECTAL BLEEDING DUE TO Moderate sized internal hemorrhoids/ trv colon erosion, bx benign.  . Esophagogastroduodenoscopy N/A  04/19/2013    [id number] Non-erosive gastritis/PERI-UMBILICAL PAIN DUE TO GERD, GASTRITIS, AND CONSTIPATION. Bx with mild chronic inactive gastritis. No H.Pylori  . Hemorrhoid banding  2015    Dr. Gala Romney- in office banding procedure  . Cholecystectomy  02/2015   Family History  Problem Relation Age of Onset  . Hypertension Mother   . Hyperlipidemia Mother   . Hypertension Father   . Hypertension Maternal Grandmother   . Diabetes Maternal Grandmother   . Diabetes Paternal Grandmother   . COPD Paternal Grandfather   . Heart disease Paternal Grandfather   . Colon cancer Other     maternal great uncle  . Stroke Maternal Aunt   . Migraines Maternal Aunt      Exam       Pelvic Exam:    Perineum: Normal Perineum   Vulva: normal   Vagina:  normal mucosa, normal appearing discharge,wet prep negative no palpable nodules   Uterus Normal, Gravid, FH: 12     Cervix: normal   Adnexa: Not palpable   Urinary:  urethral meatus normal    System:     Skin: normal coloration and turgor, no rashes    Neurologic: oriented, normal, normal mood   Extremities: normal strength, tone, and muscle mass   HEENT PERRLA   Mouth/Teeth mucous membranes moist, normal dentition   Neck supple and no masses   Cardiovascular:  regular rate and rhythm   Respiratory:  appears well, vitals normal, no respiratory distress, acyanotic   Abdomen: soft, non-tender;  FHR: 160us      NT/IT today:  [place] 12+1 wks,measurements c/w dates,normal ov's bilat,crl 63.0 mm,NB present,NT 1.4 mm,fhr 165 bpm           Assessment:    Pregnancy: G1P0000 Patient Active Problem List   Diagnosis Date Noted  . Borderline diabetes   . Supervision of normal pregnancy 01/12/2016  . Pregnant 12/16/2015  . Menstrual periods irregular 09/18/2015  . Hematuria 09/18/2015  . Internal hemorrhoids with other complication [id number]  . Chiari malformation type I ([place]) 11/27/2013  . Migraine without aura, with intractable migraine, so  stated, without mention of status migrainosus 11/11/2013  . Abdominal pain, chronic, right lower quadrant 08/16/2013  . Unspecified constipation 08/16/2013  . Headache(784.0) 07/09/2013  . Bowel habit changes 03/22/2013  . BRBPR (bright red blood per rectum) 03/22/2013  . RUQ pain 03/22/2013  . Abdominal pain, epigastric 03/22/2013  . Esophageal dysphagia 03/22/2013  . Unspecified vitamin D deficiency 03/06/2013  . GERD (gastroesophageal reflux disease) 03/06/2013  . OBESITY 07/07/2010  . SINUS ARRHYTHMIA 07/07/2010  . ANEMIA 06/23/2010        Plan:     Initial labs drawn. Continue prenatal vitamins  Problem list reviewed and updated  Reviewed n/v relief measures and warning s/s to report  Will get early 2hr gtt If proteinuria is persistent, will check 24 hour for baseline MRI this pregnancy to assess Chiari malformation Reviewed recommended weight gain based on pre-gravid BMI  Encouraged well-balanced diet Genetic Screening discussed Integrated Screen: results reviewed.  Ultrasound discussed; fetal survey: requested.  Return in about 4 weeks (around 02/09/2016) for 2nd IT, LROB.  CRESENZO-DISHMAN,Railee [place] 01/13/2016

## 2016-01-13 ENCOUNTER — Encounter: Payer: Self-pay | Admitting: Advanced Practice Midwife

## 2016-01-13 DIAGNOSIS — R7303 Prediabetes: Secondary | ICD-10-CM | POA: Insufficient documentation

## 2016-01-14 ENCOUNTER — Other Ambulatory Visit: Payer: Self-pay | Admitting: Advanced Practice Midwife

## 2016-01-14 DIAGNOSIS — R7303 Prediabetes: Secondary | ICD-10-CM

## 2016-01-14 DIAGNOSIS — E559 Vitamin D deficiency, unspecified: Secondary | ICD-10-CM

## 2016-01-14 LAB — CBC
HEMATOCRIT: 37.1 % (ref 34.0–46.6)
HEMOGLOBIN: 12.2 g/dL (ref 11.1–15.9)
MCH: 27.9 pg (ref 26.6–33.0)
MCHC: 32.9 g/dL (ref 31.5–35.7)
MCV: 85 fL (ref 79–97)
Platelets: 375 10*3/uL (ref 150–379)
RBC: 4.38 x10E6/uL (ref 3.77–5.28)
RDW: 14.6 % (ref 12.3–15.4)
WBC: 8.9 10*3/uL (ref 3.4–10.8)

## 2016-01-14 LAB — MICROSCOPIC EXAMINATION

## 2016-01-14 LAB — MATERNAL SCREEN, INTEGRATED #1
Crown Rump Length: 63 mm
Gest. Age on Collection Date: 12.6 weeks
MATERNAL AGE AT EDD: 30.7 a
NUMBER OF FETUSES: 1
Nuchal Translucency (NT): 1.4 mm
PAPP-A Value: 693.2 ng/mL
WEIGHT: 199 [lb_av]

## 2016-01-14 LAB — PMP SCREEN PROFILE (10S), URINE
Amphetamine Screen, Ur: NEGATIVE ng/mL
BENZODIAZEPINE SCREEN, URINE: NEGATIVE ng/mL
Barbiturate Screen, Ur: NEGATIVE ng/mL
COCAINE(METAB.) SCREEN, URINE: NEGATIVE ng/mL
Cannabinoids Ur Ql Scn: NEGATIVE ng/mL
Creatinine(Crt), U: 352.2 mg/dL — ABNORMAL HIGH (ref 20.0–300.0)
Methadone Scn, Ur: NEGATIVE ng/mL
OPIATE SCRN UR: NEGATIVE ng/mL
Oxycodone+Oxymorphone Ur Ql Scn: NEGATIVE ng/mL
PCP Scrn, Ur: NEGATIVE ng/mL
Ph of Urine: 5.8 (ref 4.5–8.9)
Propoxyphene, Screen: NEGATIVE ng/mL

## 2016-01-14 LAB — URINE CULTURE

## 2016-01-14 LAB — URINALYSIS, ROUTINE W REFLEX MICROSCOPIC
BILIRUBIN UA: NEGATIVE
GLUCOSE, UA: NEGATIVE
Ketones, UA: NEGATIVE
LEUKOCYTES UA: NEGATIVE
Nitrite, UA: NEGATIVE
PH UA: 6 (ref 5.0–7.5)
UUROB: 0.2 mg/dL (ref 0.2–1.0)

## 2016-01-14 LAB — HEPATITIS B SURFACE ANTIGEN: HEP B S AG: NEGATIVE

## 2016-01-14 LAB — RPR: RPR: NONREACTIVE

## 2016-01-14 LAB — ABO/RH: RH TYPE: POSITIVE

## 2016-01-14 LAB — RUBELLA SCREEN: RUBELLA: 6.35 {index} (ref >0.99)

## 2016-01-14 LAB — SICKLE CELL SCREEN: Sickle Cell Screen: NEGATIVE

## 2016-01-14 LAB — ANTIBODY SCREEN: Antibody Screen: NEGATIVE

## 2016-01-14 LAB — VARICELLA ZOSTER ANTIBODY, IGG: VARICELLA: 1677 {index} (ref >165)

## 2016-01-14 LAB — HIV ANTIBODY (ROUTINE TESTING W REFLEX): HIV Screen 4th Generation wRfx: NONREACTIVE

## 2016-01-19 LAB — GLUCOSE TOLERANCE, 2 HOURS W/ 1HR
GLUCOSE, 1 HOUR: 102 mg/dL (ref 65–179)
GLUCOSE, 2 HOUR: 99 mg/dL (ref 65–152)
GLUCOSE, FASTING: 81 mg/dL (ref 65–91)

## 2016-01-19 LAB — VITAMIN D 25 HYDROXY (VIT D DEFICIENCY, FRACTURES): VIT D 25 HYDROXY: 19.3 ng/mL — AB (ref 30.0–100.0)

## 2016-01-19 LAB — HEMOGLOBIN A1C
Est. average glucose Bld gHb Est-mCnc: 117 mg/dL
Hgb A1c MFr Bld: 5.7 % — ABNORMAL HIGH (ref 4.8–5.6)

## 2016-01-26 ENCOUNTER — Encounter: Payer: Self-pay | Admitting: Advanced Practice Midwife

## 2016-02-09 ENCOUNTER — Ambulatory Visit (INDEPENDENT_AMBULATORY_CARE_PROVIDER_SITE_OTHER): Payer: 59 | Admitting: Obstetrics & Gynecology

## 2016-02-09 ENCOUNTER — Encounter: Payer: Self-pay | Admitting: Obstetrics & Gynecology

## 2016-02-09 VITALS — BP 104/80 | HR 74 | Wt 204.0 lb

## 2016-02-09 DIAGNOSIS — Z369 Encounter for antenatal screening, unspecified: Secondary | ICD-10-CM

## 2016-02-09 DIAGNOSIS — Z331 Pregnant state, incidental: Secondary | ICD-10-CM

## 2016-02-09 DIAGNOSIS — Z3492 Encounter for supervision of normal pregnancy, unspecified, second trimester: Secondary | ICD-10-CM

## 2016-02-09 DIAGNOSIS — Z1389 Encounter for screening for other disorder: Secondary | ICD-10-CM

## 2016-02-09 LAB — POCT URINALYSIS DIPSTICK
Blood, UA: 3
Glucose, UA: NEGATIVE
Ketones, UA: NEGATIVE
Leukocytes, UA: NEGATIVE
Nitrite, UA: NEGATIVE

## 2016-02-09 NOTE — Progress Notes (Signed)
G1P0000 [redacted]w[redacted]d Estimated Date of Delivery: 07/25/16  Blood pressure 104/80, pulse 74, weight 204 lb (92.534 kg), last menstrual period 10/19/2015.   BP weight and urine results all reviewed and noted.  Please refer to the obstetrical flow sheet for the fundal height and fetal heart rate documentation:  Patient reports good fetal movement, denies any bleeding and no rupture of membranes symptoms or regular contractions. Patient is without complaints. All questions were answered.  Orders Placed This Encounter  Procedures  . US OB Comp + 14 Wk  . Maternal Screen, Integrated #2  . POCT urinalysis dipstick    Plan:  Continued routine obstetrical care, can take   protonix , declines to use an anti emetic Discussed her Chiari 1 malformation  Sonogram 4 weeks  No Follow-up on file.

## 2016-02-11 LAB — MATERNAL SCREEN, INTEGRATED #2
ADSF: 0.48
AFP MoM: 0.94
Alpha-Fetoprotein: 28 ng/mL
CROWN RUMP LENGTH: 63 mm
DIA MOM: 1.5
DIA Value: 221.2 pg/mL
ESTRIOL UNCONJUGATED: 0.41 ng/mL
GEST. AGE ON COLLECTION DATE: 12.6 wk
GESTATIONAL AGE: 16.6 wk
HCG MOM: 1.92
MATERNAL AGE AT EDD: 30.7 a
Nuchal Translucency (NT): 1.4 mm
Nuchal Translucency MoM: 0.88
Number of Fetuses: 1
PAPP-A MoM: 1
PAPP-A Value: 693.2 ng/mL
Test Results:: NEGATIVE
WEIGHT: 199 [lb_av]
Weight: 199 [lb_av]
hCG Value: 50.4 IU/mL

## 2016-03-01 ENCOUNTER — Other Ambulatory Visit: Payer: Self-pay | Admitting: Obstetrics & Gynecology

## 2016-03-01 DIAGNOSIS — Z1389 Encounter for screening for other disorder: Secondary | ICD-10-CM

## 2016-03-08 ENCOUNTER — Ambulatory Visit (INDEPENDENT_AMBULATORY_CARE_PROVIDER_SITE_OTHER): Payer: 59

## 2016-03-08 ENCOUNTER — Encounter: Payer: Self-pay | Admitting: Obstetrics and Gynecology

## 2016-03-08 ENCOUNTER — Ambulatory Visit (INDEPENDENT_AMBULATORY_CARE_PROVIDER_SITE_OTHER): Payer: 59 | Admitting: Obstetrics and Gynecology

## 2016-03-08 VITALS — BP 110/70 | HR 101 | Wt 202.0 lb

## 2016-03-08 DIAGNOSIS — Z3492 Encounter for supervision of normal pregnancy, unspecified, second trimester: Secondary | ICD-10-CM

## 2016-03-08 DIAGNOSIS — Z3A20 20 weeks gestation of pregnancy: Secondary | ICD-10-CM | POA: Diagnosis not present

## 2016-03-08 DIAGNOSIS — G935 Compression of brain: Secondary | ICD-10-CM

## 2016-03-08 DIAGNOSIS — Z36 Encounter for antenatal screening of mother: Secondary | ICD-10-CM

## 2016-03-08 DIAGNOSIS — Z3A21 21 weeks gestation of pregnancy: Secondary | ICD-10-CM

## 2016-03-08 DIAGNOSIS — Z3482 Encounter for supervision of other normal pregnancy, second trimester: Secondary | ICD-10-CM

## 2016-03-08 DIAGNOSIS — Z1389 Encounter for screening for other disorder: Secondary | ICD-10-CM

## 2016-03-08 DIAGNOSIS — Z331 Pregnant state, incidental: Secondary | ICD-10-CM

## 2016-03-08 LAB — POCT URINALYSIS DIPSTICK
Glucose, UA: NEGATIVE
Ketones, UA: NEGATIVE
Leukocytes, UA: NEGATIVE
NITRITE UA: NEGATIVE
Protein, UA: NEGATIVE
RBC UA: NEGATIVE

## 2016-03-08 MED ORDER — PANTOPRAZOLE SODIUM 40 MG PO TBEC: 40.0000 mg | DELAYED_RELEASE_TABLET | Freq: Every day | ORAL | Status: DC

## 2016-03-08 NOTE — Progress Notes (Signed)
Pt denies any problems or concerns at this time.  

## 2016-03-08 NOTE — Progress Notes (Addendum)
[place] [id number] wks,cephalic,cx 3.8 cm,post pl gr 0,normal ov's bilat,fhr 148 bpm,svp of fluid 4.5 cm,efw 317 g,please have pt come back for additional images of the spine and head,limited because of fetal pos

## 2016-03-08 NOTE — Progress Notes (Signed)
G1P0000 [redacted]w[redacted]d Estimated Date of Delivery: 07/25/16  Blood pressure 110/70, pulse 101, weight 202 lb (91.627 kg), last menstrual period 10/19/2015.  Patient had ultrasound today which was normal except that spine could not be visualized due to fetal position completion of anatomic survey will be performed at 28 weeks refer to the ob flow sheet for FH and FHR, also BP, Wt, Urine results:notable for neg protein, neg gluc.   Patient reports   good fetal movement, denies any bleeding and no rupture of membranes symptoms or regular contractions. Patient complaints:none.  Questions were answered. normal anatomic survey except incomplete due to fetal position Assessment: LROB G1P0000 @ [redacted]w[redacted]d normal anatomic survey except fetal position prevented spine evaluation recheck 28 weeks                       Reflux symptoms, Protonix refilled Plan:  Continued routine obstetrical care,   F/u in 4 weeks weeks for lowrisk OB will need anesthesia consult due to Chiari malformation

## 2016-04-05 ENCOUNTER — Encounter: Payer: Self-pay | Admitting: Women's Health

## 2016-04-05 ENCOUNTER — Ambulatory Visit (INDEPENDENT_AMBULATORY_CARE_PROVIDER_SITE_OTHER): Payer: 59 | Admitting: Women's Health

## 2016-04-05 VITALS — BP 126/86 | HR 72 | Wt 216.0 lb

## 2016-04-05 DIAGNOSIS — Z331 Pregnant state, incidental: Secondary | ICD-10-CM

## 2016-04-05 DIAGNOSIS — Z3A25 25 weeks gestation of pregnancy: Secondary | ICD-10-CM

## 2016-04-05 DIAGNOSIS — Z118 Encounter for screening for other infectious and parasitic diseases: Secondary | ICD-10-CM

## 2016-04-05 DIAGNOSIS — Z1389 Encounter for screening for other disorder: Secondary | ICD-10-CM

## 2016-04-05 DIAGNOSIS — Z3482 Encounter for supervision of other normal pregnancy, second trimester: Secondary | ICD-10-CM

## 2016-04-05 DIAGNOSIS — Z3492 Encounter for supervision of normal pregnancy, unspecified, second trimester: Secondary | ICD-10-CM

## 2016-04-05 DIAGNOSIS — Z1159 Encounter for screening for other viral diseases: Secondary | ICD-10-CM

## 2016-04-05 LAB — POCT URINALYSIS DIPSTICK
GLUCOSE UA: NEGATIVE
Ketones, UA: NEGATIVE
LEUKOCYTES UA: NEGATIVE
NITRITE UA: NEGATIVE

## 2016-04-05 NOTE — Progress Notes (Signed)
Low-risk OB appointment G1P0000 [redacted]w[redacted]d Estimated Date of Delivery: 07/25/16 BP 126/86 mmHg  Pulse 72  Wt 216 lb (97.977 kg)  LMP 10/19/2015 (Exact Date)  BP, weight, and urine reviewed.  Refer to obstetrical flow sheet for FH & FHR.  Reports good fm.  Denies regular uc's, lof, vb, or uti s/s. 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 Signed up for cb classes in Aug. Does not want to labor/birth on back, not planning epidural d/t chiari malformation, plans to go natural Reviewed ptl s/s, fm Plan:  Continue routine obstetrical care  F/U in 4wks for OB appointment, pn2, and f/u anatomy u/s for limited views of spine

## 2016-04-05 NOTE — Patient Instructions (Addendum)
You will have your sugar test next visit.  Please do not eat or drink anything after midnight the night before you come, not even water.  You will be here for at least two hours.     Call the office [phone]) or go to [facility] if:  You begin to have strong, frequent contractions  Your water breaks.  Sometimes it is a big gush of fluid, sometimes it is just a trickle that keeps getting your panties wet or running down your legs  You have vaginal bleeding.  It is normal to have a small amount of spotting if your cervix was checked.   You don't feel your baby moving like normal.  If you don't, get you something to eat and drink and lay down and focus on feeling your baby move.   If your baby is still not moving like normal, you should call the office or go to [place] [facility] Pregnancy The second trimester is from week 13 through week 28, months 4 through 6. The second trimester is often a time when you feel your best. Your body has also adjusted to being pregnant, and you begin to feel better physically. Usually, morning sickness has lessened or quit completely, you may have more energy, and you may have an increase in appetite. The second trimester is also a time when the fetus is growing rapidly. At the end of the sixth month, the fetus is about 9 inches long and weighs about 1 pounds. You will likely begin to feel the baby move (quickening) between 18 and 20 weeks of the pregnancy. BODY CHANGES Your body goes through many changes during pregnancy. The changes vary from woman to woman.   Your weight will continue to increase. You will notice your lower abdomen bulging out.  You may begin to get stretch marks on your hips, abdomen, and breasts.  You may develop headaches that can be relieved by medicines approved by your health care provider.  You may urinate more often because the fetus is pressing on your bladder.  You may develop or continue to have  heartburn as a result of your pregnancy.  You may develop constipation because certain hormones are causing the muscles that push waste through your intestines to slow down.  You may develop hemorrhoids or swollen, bulging veins (varicose veins).  You may have back pain because of the weight gain and pregnancy hormones relaxing your joints between the bones in your pelvis and as a result of a shift in weight and the muscles that support your balance.  Your breasts will continue to grow and be tender.  Your gums may bleed and may be sensitive to brushing and flossing.  Dark spots or blotches (chloasma, mask of pregnancy) may develop on your face. This will likely fade after the baby is born.  A dark line from your belly button to the pubic area (linea nigra) may appear. This will likely fade after the baby is born.  You may have changes in your hair. These can include thickening of your hair, rapid growth, and changes in texture. Some women also have hair loss during or after pregnancy, or hair that feels dry or thin. Your hair will most likely return to normal after your baby is born. WHAT TO EXPECT AT YOUR PRENATAL VISITS During a routine prenatal visit:  You will be weighed to make sure you and the fetus are growing normally.  Your blood pressure will be taken.  Your abdomen will be measured to track your baby's growth.  The fetal heartbeat will be listened to.  Any test results from the previous visit will be discussed. Your health care provider may ask you:  How you are feeling.  If you are feeling the baby move.  If you have had any abnormal symptoms, such as leaking fluid, bleeding, severe headaches, or abdominal cramping.  If you have any questions. Other tests that may be performed during your second trimester include:  Blood tests that check for:  Low iron levels (anemia).  Gestational diabetes (between 24 and 28 weeks).  Rh antibodies.  Urine tests to check  for infections, diabetes, or protein in the urine.  An ultrasound to confirm the proper growth and development of the baby.  An amniocentesis to check for possible genetic problems.  Fetal screens for spina bifida and Down syndrome. HOME CARE INSTRUCTIONS   Avoid all smoking, herbs, alcohol, and unprescribed drugs. These chemicals affect the formation and growth of the baby.  Follow your health care provider's instructions regarding medicine use. There are medicines that are either safe or unsafe to take during pregnancy.  Exercise only as directed by your health care provider. Experiencing uterine cramps is a good sign to stop exercising.  Continue to eat regular, healthy meals.  Wear a good support bra for breast tenderness.  Do not use hot tubs, steam rooms, or saunas.  Wear your seat belt at all times when driving.  Avoid raw meat, uncooked cheese, cat litter boxes, and soil used by cats. These carry germs that can cause birth defects in the baby.  Take your prenatal vitamins.  Try taking a stool softener (if your health care provider approves) if you develop constipation. Eat more high-fiber foods, such as fresh vegetables or fruit and whole grains. Drink plenty of fluids to keep your urine clear or pale yellow.  Take warm sitz baths to soothe any pain or discomfort caused by hemorrhoids. Use hemorrhoid cream if your health care provider approves.  If you develop varicose veins, wear support hose. Elevate your feet for 15 minutes, 3-4 times a day. Limit salt in your diet.  Avoid heavy lifting, wear low heel shoes, and practice good posture.  Rest with your legs elevated if you have leg cramps or low back pain.  Visit your dentist if you have not gone yet during your pregnancy. Use a soft toothbrush to brush your teeth and be gentle when you floss.  A sexual relationship may be continued unless your health care provider directs you otherwise.  Continue to go to all your  prenatal visits as directed by your health care provider. SEEK MEDICAL CARE IF:   You have dizziness.  You have mild pelvic cramps, pelvic pressure, or nagging pain in the abdominal area.  You have persistent nausea, vomiting, or diarrhea.  You have a bad smelling vaginal discharge.  You have pain with urination. SEEK IMMEDIATE MEDICAL CARE IF:   You have a fever.  You are leaking fluid from your vagina.  You have spotting or bleeding from your vagina.  You have severe abdominal cramping or pain.  You have rapid weight gain or loss.  You have shortness of breath with chest pain.  You notice sudden or extreme swelling of your face, hands, ankles, feet, or legs.  You have not felt your baby move in over an hour.  You have severe headaches that do not go away with medicine.  You have vision changes.  Document Released: 09/20/2001 Document Revised: 10/01/2013 Document Reviewed: 11/27/2012 Exit[employer] Patient Information 2015 [employer]. This information is not intended to replace advice given to you by your health care provider. Make sure you discuss any questions you have with your health care provider.     

## 2016-04-07 LAB — GC/CHLAMYDIA PROBE AMP
Chlamydia trachomatis, NAA: NEGATIVE
Neisseria gonorrhoeae by PCR: NEGATIVE

## 2016-04-08 ENCOUNTER — Encounter: Payer: Self-pay | Admitting: Women's Health

## 2016-04-10 ENCOUNTER — Inpatient Hospital Stay (HOSPITAL_COMMUNITY)
Admission: AD | Admit: 2016-04-10 | Discharge: 2016-04-10 | Disposition: A | Discharge status: Home / Self Care | Payer: 59 | Source: Ambulatory Visit | Attending: Obstetrics & Gynecology | Admitting: Obstetrics & Gynecology

## 2016-04-10 ENCOUNTER — Encounter (HOSPITAL_COMMUNITY): Payer: Self-pay | Admitting: *Deleted

## 2016-04-10 DIAGNOSIS — K219 Gastro-esophageal reflux disease without esophagitis: Secondary | ICD-10-CM | POA: Diagnosis not present

## 2016-04-10 DIAGNOSIS — Z7984 Long term (current) use of oral hypoglycemic drugs: Secondary | ICD-10-CM | POA: Insufficient documentation

## 2016-04-10 DIAGNOSIS — O4692 Antepartum hemorrhage, unspecified, second trimester: Secondary | ICD-10-CM | POA: Diagnosis present

## 2016-04-10 DIAGNOSIS — E249 Cushing's syndrome, unspecified: Secondary | ICD-10-CM | POA: Diagnosis not present

## 2016-04-10 DIAGNOSIS — E119 Type 2 diabetes mellitus without complications: Secondary | ICD-10-CM | POA: Insufficient documentation

## 2016-04-10 DIAGNOSIS — Z3492 Encounter for supervision of normal pregnancy, unspecified, second trimester: Secondary | ICD-10-CM

## 2016-04-10 DIAGNOSIS — Z3A24 24 weeks gestation of pregnancy: Secondary | ICD-10-CM

## 2016-04-10 DIAGNOSIS — Z8719 Personal history of other diseases of the digestive system: Secondary | ICD-10-CM | POA: Diagnosis not present

## 2016-04-10 DIAGNOSIS — S30814A Abrasion of vagina and vulva, initial encounter: Secondary | ICD-10-CM | POA: Diagnosis not present

## 2016-04-10 DIAGNOSIS — O468X2 Other antepartum hemorrhage, second trimester: Secondary | ICD-10-CM | POA: Diagnosis not present

## 2016-04-10 DIAGNOSIS — O99612 Diseases of the digestive system complicating pregnancy, second trimester: Secondary | ICD-10-CM | POA: Diagnosis not present

## 2016-04-10 DIAGNOSIS — G935 Compression of brain: Secondary | ICD-10-CM | POA: Insufficient documentation

## 2016-04-10 DIAGNOSIS — O26892 Other specified pregnancy related conditions, second trimester: Secondary | ICD-10-CM | POA: Insufficient documentation

## 2016-04-10 DIAGNOSIS — O24112 Pre-existing diabetes mellitus, type 2, in pregnancy, second trimester: Secondary | ICD-10-CM | POA: Diagnosis not present

## 2016-04-10 DIAGNOSIS — Z880 Allergy status to penicillin: Secondary | ICD-10-CM | POA: Diagnosis not present

## 2016-04-10 LAB — URINE MICROSCOPIC-ADD ON

## 2016-04-10 LAB — URINALYSIS, ROUTINE W REFLEX MICROSCOPIC
Bilirubin Urine: NEGATIVE
Glucose, UA: NEGATIVE mg/dL
KETONES UR: NEGATIVE mg/dL
Nitrite: NEGATIVE
PROTEIN: NEGATIVE mg/dL
Specific Gravity, Urine: 1.015 (ref 1.005–1.030)
pH: 6 (ref 5.0–8.0)

## 2016-04-10 LAB — WET PREP, GENITAL
CLUE CELLS WET PREP: NONE SEEN
SPERM: NONE SEEN
Trich, Wet Prep: NONE SEEN
Yeast Wet Prep HPF POC: NONE SEEN

## 2016-04-10 NOTE — Discharge Instructions (Signed)
Abrasion °An abrasion is a cut or scrape on the surface of your skin. An abrasion does not go through all of the layers of your skin. It is important to take good care of your abrasion to prevent infection. °HOME CARE °Medicines °· Take or apply medicines only as told by your doctor. °· If you were prescribed an antibiotic ointment, finish all of it even if you start to feel better. °Wound Care °· Clean the wound with mild soap and water 2-3 times per day or as told by your doctor. Pat your wound dry with a clean towel. Do not rub it. °· There are many ways to close and cover a wound. Follow instructions from your doctor about: °¨ How to take care of your wound. °¨ When and how you should change your bandage (dressing). °¨ When and how you should take off your dressing. °· Check your wound every day for signs of infection. Watch for: °¨ Redness, swelling, or pain. °¨ Fluid, blood, or pus. °General Instructions °· Keep the dressing dry as told by your doctor. Do not take baths, swim, use a hot tub, or do anything that would put your wound underwater until your doctor says it is okay. °· If there is swelling, raise (elevate) the injured area above the level of your heart while you are sitting or lying down. °· Keep all follow-up visits as told by your doctor. This is important. °GET HELP IF: °· You were given a tetanus shot and you have any of these where the needle went in: °¨ Swelling. °¨ Very bad pain. °¨ Redness. °¨ Bleeding. °· Medicine does not help your pain. °· You have any of these at the site of the wound: °¨ More redness. °¨ More swelling. °¨ More pain. °GET HELP RIGHT AWAY IF: °· You have a red streak going away from your wound. °· You have a fever. °· You have fluid, blood, or pus coming from your wound. °· There is a bad smell coming from your wound. °  °This information is not intended to replace advice given to you by your health care provider. Make sure you discuss any questions you have with your  health care provider. °  °Document Released: 03/14/2008 Document Revised: 02/10/2015 Document Reviewed: 09/24/2014 °Elsevier Interactive Patient Education ©2016 Elsevier Inc. ° °

## 2016-04-10 NOTE — MAU Note (Signed)
Cramping and bleeding with wiping

## 2016-04-10 NOTE — MAU Provider Note (Signed)
History    G1 @ 24.6 wks in with c/o bleeding when she wipes and a head cold. Bleeding started this afternoon. Denies ROM. Good fetal movement CSN: [id number]  Arrival date & time 04/10/16  1814   None     Chief Complaint  Patient presents with  . Vaginal Bleeding    HPI  Past Medical History  Diagnosis Date  . 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 Migraine without aura, with intractable migraine, so stated, without mention of status migrainosus 11/11/2013  . Gastroesophageal reflux disease   . Vitamin D deficiency   . Borderline diabetes 2011    r/t adrenal tumor, now resolved  . Chiari malformation type I ([place]) 2015    on MRI  . Cushing's syndrome ([place]) 11/27/2013  . Patient desires pregnancy 11/27/2013  . Menstrual periods irregular 09/18/2015  . Hematuria 09/18/2015  . Pregnant 12/16/2015  . Diabetes mellitus without complication ([place])     has cushings and A1c was 5.9 takes metformin    Past Surgical History  Procedure Laterality Date  . 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 Tonsillectomy    . Colonoscopy N/A 04/19/2013    [id number] mucosa in the terminal ileum/Single erosion in transverse colon/RECTAL BLEEDING DUE TO Moderate sized internal hemorrhoids/ trv colon erosion, bx benign.  . Esophagogastroduodenoscopy N/A 04/19/2013    [place]:[id number] Non-erosive gastritis/PERI-UMBILICAL PAIN DUE TO GERD, GASTRITIS, AND CONSTIPATION. Bx with mild chronic inactive gastritis. No H.Pylori  . Hemorrhoid banding  2015    Dr. Gala Romney- in office banding procedure  . Cholecystectomy  02/2015    Family History  Problem Relation Age of Onset  . Hypertension Mother   . Hyperlipidemia Mother   . Hypertension Father   . Hypertension Maternal Grandmother   . Diabetes Maternal Grandmother   . Diabetes Paternal Grandmother   . COPD Paternal Grandfather   . Heart disease Paternal Grandfather   . Colon cancer Other     maternal great uncle  .  Stroke Maternal Aunt   . Migraines Maternal Aunt     Social History  Substance Use Topics  . Smoking status: Never Smoker   . Smokeless tobacco: Never Used  . Alcohol Use: No    OB History    Gravida Para Term Preterm AB TAB SAB Ectopic Multiple Living   1 0 0 0 0 0 0 0 0 0       Review of Systems  Constitutional: Negative.   HENT: Negative.   Eyes: Negative.   Respiratory: Negative.   Cardiovascular: Negative.   Gastrointestinal: Negative.   Endocrine: Negative.   Genitourinary: Positive for vaginal bleeding.  Musculoskeletal: Negative.   Skin: Negative.   Allergic/Immunologic: Negative.   Neurological: Negative.   Hematological: Negative.   Psychiatric/Behavioral: Negative.     Allergies  Penicillins; Iodine; Latex; Povidone iodine; and Methylprednisolone  Home Medications  No current outpatient prescriptions on file.  BP 142/83 mmHg  Pulse 89  Temp(Src) 98.3 F (36.8 C) (Oral)  Resp 18  Ht 5\' 4"  (1.626 m)  Wt 216 lb (97.977 kg)  BMI 37.06 kg/m2  LMP 10/19/2015 (Exact Date)  Physical Exam  Constitutional: She is oriented to person, place, and time. She appears well-developed and well-nourished.  HENT:  Head: Normocephalic.  Eyes: Pupils are equal, round, and reactive to light.  Neck: Normal range of motion.  Cardiovascular: Normal rate, regular rhythm, normal heart sounds and intact distal pulses.  Pulmonary/Chest: Effort normal and breath sounds normal.  Abdominal: Soft. Bowel sounds are normal.  Genitourinary: Uterus normal.  sm abrasion that is oozing on labia minora.  Musculoskeletal: Normal range of motion.  Neurological: She is alert and oriented to person, place, and time. She has normal reflexes.  Skin: Skin is warm and dry.  Psychiatric: She has a normal mood and affect. Her behavior is normal. Thought content normal.    MAU Course  Procedures (including critical care time)  Labs Reviewed  URINALYSIS, [place] (NOT  AT [facility]) - Abnormal; Notable for the following:    Hgb urine dipstick LARGE (*)    Leukocytes, UA SMALL (*)    All other components within normal limits  URINE MICROSCOPIC-ADD ON - Abnormal; Notable for the following:    Squamous Epithelial / LPF 0-5 (*)    Bacteria, UA FEW (*)    All other components within normal limits   No results found.   1. Supervision of normal pregnancy, second trimester       MDM  With exam abrasion noted on left labia minora that is oozing blood. Sterile spec exam done.. No vaginal bleeding, cervix visually closed. Wet prep neg. Will d/c home

## 2016-04-13 ENCOUNTER — Encounter: Payer: Self-pay | Admitting: Women's Health

## 2016-04-29 ENCOUNTER — Other Ambulatory Visit: Payer: Self-pay | Admitting: Women's Health

## 2016-04-29 DIAGNOSIS — Z0489 Encounter for examination and observation for other specified reasons: Secondary | ICD-10-CM

## 2016-04-29 DIAGNOSIS — IMO0002 Reserved for concepts with insufficient information to code with codable children: Secondary | ICD-10-CM

## 2016-05-02 ENCOUNTER — Other Ambulatory Visit: Payer: 59

## 2016-05-02 DIAGNOSIS — Z369 Encounter for antenatal screening, unspecified: Secondary | ICD-10-CM

## 2016-05-02 DIAGNOSIS — Z131 Encounter for screening for diabetes mellitus: Secondary | ICD-10-CM

## 2016-05-03 ENCOUNTER — Telehealth: Payer: Self-pay | Admitting: *Deleted

## 2016-05-03 ENCOUNTER — Ambulatory Visit (INDEPENDENT_AMBULATORY_CARE_PROVIDER_SITE_OTHER): Payer: 59

## 2016-05-03 ENCOUNTER — Ambulatory Visit (INDEPENDENT_AMBULATORY_CARE_PROVIDER_SITE_OTHER): Payer: 59 | Admitting: Women's Health

## 2016-05-03 ENCOUNTER — Encounter: Payer: Self-pay | Admitting: Women's Health

## 2016-05-03 ENCOUNTER — Other Ambulatory Visit: Payer: Self-pay | Admitting: Women's Health

## 2016-05-03 VITALS — BP 118/78 | HR 72 | Wt 223.0 lb

## 2016-05-03 DIAGNOSIS — Z1389 Encounter for screening for other disorder: Secondary | ICD-10-CM

## 2016-05-03 DIAGNOSIS — Z36 Encounter for antenatal screening of mother: Secondary | ICD-10-CM | POA: Diagnosis not present

## 2016-05-03 DIAGNOSIS — Z0489 Encounter for examination and observation for other specified reasons: Secondary | ICD-10-CM

## 2016-05-03 DIAGNOSIS — Z3A28 28 weeks gestation of pregnancy: Secondary | ICD-10-CM

## 2016-05-03 DIAGNOSIS — Z3403 Encounter for supervision of normal first pregnancy, third trimester: Secondary | ICD-10-CM

## 2016-05-03 DIAGNOSIS — Z3493 Encounter for supervision of normal pregnancy, unspecified, third trimester: Secondary | ICD-10-CM

## 2016-05-03 DIAGNOSIS — IMO0002 Reserved for concepts with insufficient information to code with codable children: Secondary | ICD-10-CM

## 2016-05-03 DIAGNOSIS — Z331 Pregnant state, incidental: Secondary | ICD-10-CM

## 2016-05-03 LAB — POCT URINALYSIS DIPSTICK
Glucose, UA: NEGATIVE
KETONES UA: NEGATIVE
Nitrite, UA: NEGATIVE
PROTEIN UA: NEGATIVE

## 2016-05-03 LAB — CBC
HEMATOCRIT: 31 % — AB (ref 34.0–46.6)
Hemoglobin: 10.2 g/dL — ABNORMAL LOW (ref 11.1–15.9)
MCH: 27.9 pg (ref 26.6–33.0)
MCHC: 32.9 g/dL (ref 31.5–35.7)
MCV: 85 fL (ref 79–97)
Platelets: 302 10*3/uL (ref 150–379)
RBC: 3.65 x10E6/uL — AB (ref 3.77–5.28)
RDW: 13.9 % (ref 12.3–15.4)
WBC: 7.9 10*3/uL (ref 3.4–10.8)

## 2016-05-03 LAB — GLUCOSE TOLERANCE, 2 HOURS W/ 1HR
GLUCOSE, 1 HOUR: 127 mg/dL (ref 65–179)
GLUCOSE, FASTING: 75 mg/dL (ref 65–91)
Glucose, 2 hour: 96 mg/dL (ref 65–152)

## 2016-05-03 LAB — RPR: RPR Ser Ql: NONREACTIVE

## 2016-05-03 LAB — HIV ANTIBODY (ROUTINE TESTING W REFLEX): HIV SCREEN 4TH GENERATION: NONREACTIVE

## 2016-05-03 LAB — ANTIBODY SCREEN: Antibody Screen: NEGATIVE

## 2016-05-03 MED ORDER — FERROUS SULFATE 325 (65 FE) MG PO TABS: 325.0000 mg | ORAL_TABLET | Freq: Two times a day (BID) | ORAL | 3 refills | Status: DC

## 2016-05-03 NOTE — Patient Instructions (Signed)

## 2016-05-03 NOTE — Progress Notes (Signed)
OB F/U today @ 28+[redacted] weeks GA. Single active fetus with FHR 127. Posterior Gr 1 placenta. AFI 13.57 and is subjectively WNL. EFW today 1249g which is consistent with LMP dating. EDD 07/25/16. Cervix closed and measured 3.0cm. Bilateral ovaries non visualized. Spine and Posterior fossa were visualized and appeared WNL today. Fetus is in cephalic position.

## 2016-05-03 NOTE — Progress Notes (Signed)
Low-risk OB appointment G1P0000 [redacted]w[redacted]d Estimated Date of Delivery: 07/25/16 BP 118/78   Pulse 72   Wt 223 lb (101.2 kg)   LMP 10/19/2015 (Exact Date)   BMI 38.28 kg/m   BP, weight, and urine reviewed.  Refer to obstetrical flow sheet for FH & FHR.  Reports good fm.  Denies regular uc's, lof, vb, or uti s/s. No complaints.  Reviewed ptl s/s, fkc. Recommended Tdap at HD/PCP per CDC guidelines. Discussed pn2 results, normal 2hr gtt, anemic-rx'd fe bid, increase fe-rich foods. Reviewed today's f/u u/s to check spine/posterior fossa- both normal Plan:  Continue routine obstetrical care  F/U in 4wks for OB appointment, discuss if MRI indicated, is currently still asymptomatic w/ Chiari malformation

## 2016-05-03 NOTE — Telephone Encounter (Signed)
Pt informed per [name], CNM, Dx of Anemia, Fe supplement e-scribed, continue PNV daily, increase Fe rich foods (red meat, green leafy veg, beans).   Pt informed Glucose tolerance test form 05/02/2016 WNL.   Pt verbalized understanding.

## 2016-05-05 ENCOUNTER — Telehealth: Payer: Self-pay | Admitting: *Deleted

## 2016-05-06 ENCOUNTER — Ambulatory Visit (INDEPENDENT_AMBULATORY_CARE_PROVIDER_SITE_OTHER): Payer: 59

## 2016-05-06 DIAGNOSIS — Z23 Encounter for immunization: Secondary | ICD-10-CM

## 2016-05-13 NOTE — Telephone Encounter (Signed)
Left messages x 2 for case manager to return call with no response.

## 2016-05-31 ENCOUNTER — Ambulatory Visit (INDEPENDENT_AMBULATORY_CARE_PROVIDER_SITE_OTHER): Payer: 59 | Admitting: Obstetrics & Gynecology

## 2016-05-31 VITALS — BP 138/80 | HR 98 | Wt 228.0 lb

## 2016-05-31 DIAGNOSIS — G935 Compression of brain: Secondary | ICD-10-CM

## 2016-05-31 DIAGNOSIS — Z331 Pregnant state, incidental: Secondary | ICD-10-CM

## 2016-05-31 DIAGNOSIS — Z1389 Encounter for screening for other disorder: Secondary | ICD-10-CM

## 2016-05-31 DIAGNOSIS — Z3493 Encounter for supervision of normal pregnancy, unspecified, third trimester: Secondary | ICD-10-CM

## 2016-05-31 DIAGNOSIS — Z3A32 32 weeks gestation of pregnancy: Secondary | ICD-10-CM

## 2016-05-31 DIAGNOSIS — Z3403 Encounter for supervision of normal first pregnancy, third trimester: Secondary | ICD-10-CM

## 2016-05-31 LAB — POCT URINALYSIS DIPSTICK
Blood, UA: 1
GLUCOSE UA: NEGATIVE
Ketones, UA: NEGATIVE
LEUKOCYTES UA: NEGATIVE
Nitrite, UA: NEGATIVE
Protein, UA: NEGATIVE

## 2016-05-31 NOTE — Progress Notes (Signed)
G1P0000 [redacted]w[redacted]d Estimated Date of Delivery: 07/25/16  Blood pressure 138/80, pulse 98, weight 228 lb (103.4 kg), last menstrual period 10/19/2015.   BP weight and urine results all reviewed and noted.  Please refer to the obstetrical flow sheet for the fundal height and fetal heart rate documentation:  Patient reports good fetal movement, denies any bleeding and no rupture of membranes symptoms or regular contractions. Patient is without complaints. All questions were answered.  Orders Placed This Encounter  Procedures  . POCT urinalysis dipstick    Plan:  Continued routine obstetrical care, will have pt see anesthesia because of her Chiari malformation  Return in about 2 weeks (around 06/14/2016) for [place], with Dr Elonda Husky.

## 2016-06-14 ENCOUNTER — Ambulatory Visit (INDEPENDENT_AMBULATORY_CARE_PROVIDER_SITE_OTHER): Payer: 59 | Admitting: Obstetrics & Gynecology

## 2016-06-14 ENCOUNTER — Encounter: Payer: Self-pay | Admitting: Obstetrics & Gynecology

## 2016-06-14 VITALS — BP 120/80 | HR 80 | Wt 226.4 lb

## 2016-06-14 DIAGNOSIS — G935 Compression of brain: Secondary | ICD-10-CM

## 2016-06-14 DIAGNOSIS — Z1389 Encounter for screening for other disorder: Secondary | ICD-10-CM

## 2016-06-14 DIAGNOSIS — Z3403 Encounter for supervision of normal first pregnancy, third trimester: Secondary | ICD-10-CM

## 2016-06-14 DIAGNOSIS — O359XX1 Maternal care for (suspected) fetal abnormality and damage, unspecified, fetus 1: Secondary | ICD-10-CM

## 2016-06-14 DIAGNOSIS — Z3492 Encounter for supervision of normal pregnancy, unspecified, second trimester: Secondary | ICD-10-CM

## 2016-06-14 DIAGNOSIS — Z331 Pregnant state, incidental: Secondary | ICD-10-CM

## 2016-06-14 DIAGNOSIS — Z3A35 35 weeks gestation of pregnancy: Secondary | ICD-10-CM

## 2016-06-14 LAB — POCT URINALYSIS DIPSTICK
Blood, UA: 2
Glucose, UA: NEGATIVE
LEUKOCYTES UA: NEGATIVE
Nitrite, UA: NEGATIVE
PROTEIN UA: 1

## 2016-06-14 NOTE — Progress Notes (Signed)
G1P0000 [redacted]w[redacted]d Estimated Date of Delivery: 07/25/16  Blood pressure 120/80, pulse 80, weight 226 lb 6.4 oz (102.7 kg), last menstrual period 10/19/2015.   BP weight and urine results all reviewed and noted.  Please refer to the obstetrical flow sheet for the fundal height and fetal heart rate documentation:  Patient reports good fetal movement, denies any bleeding and no rupture of membranes symptoms or regular contractions. Patient is without complaints. All questions were answered.  Orders Placed This Encounter  Procedures  . POCT urinalysis dipstick    Plan:  Continued routine obstetrical care, will schedule anesthesia consult next visit  Return in about 2 weeks (around 06/28/2016) for [place].

## 2016-06-28 ENCOUNTER — Ambulatory Visit (INDEPENDENT_AMBULATORY_CARE_PROVIDER_SITE_OTHER): Payer: 59 | Admitting: Obstetrics & Gynecology

## 2016-06-28 ENCOUNTER — Encounter: Payer: Self-pay | Admitting: Obstetrics & Gynecology

## 2016-06-28 VITALS — BP 120/80 | HR 72 | Wt 227.6 lb

## 2016-06-28 DIAGNOSIS — Z3A36 36 weeks gestation of pregnancy: Secondary | ICD-10-CM

## 2016-06-28 DIAGNOSIS — Z3403 Encounter for supervision of normal first pregnancy, third trimester: Secondary | ICD-10-CM

## 2016-06-28 DIAGNOSIS — Z331 Pregnant state, incidental: Secondary | ICD-10-CM

## 2016-06-28 DIAGNOSIS — Z1389 Encounter for screening for other disorder: Secondary | ICD-10-CM

## 2016-06-28 DIAGNOSIS — G935 Compression of brain: Secondary | ICD-10-CM

## 2016-06-28 DIAGNOSIS — O359XX1 Maternal care for (suspected) fetal abnormality and damage, unspecified, fetus 1: Secondary | ICD-10-CM

## 2016-06-28 DIAGNOSIS — Z3493 Encounter for supervision of normal pregnancy, unspecified, third trimester: Secondary | ICD-10-CM

## 2016-06-28 LAB — POCT URINALYSIS DIPSTICK
GLUCOSE UA: NEGATIVE
Ketones, UA: NEGATIVE
LEUKOCYTES UA: NEGATIVE
Nitrite, UA: NEGATIVE
Protein, UA: 1

## 2016-06-28 NOTE — Progress Notes (Signed)
G1P0000 [redacted]w[redacted]d Estimated Date of Delivery: 07/25/16  Blood pressure 120/80, pulse 72, weight 227 lb 9.6 oz (103.2 kg), last menstrual period 10/19/2015.   BP weight and urine results all reviewed and noted.  Please refer to the obstetrical flow sheet for the fundal height and fetal heart rate documentation:  Patient reports good fetal movement, denies any bleeding and no rupture of membranes symptoms or regular contractions. Patient is without complaints. All questions were answered.  Orders Placed This Encounter  Procedures  . POCT urinalysis dipstick    Plan:  Continued routine obstetrical care, will discuss regional anesthesia with anesthesia  Return in about 1 week (around 07/05/2016) for LROB.

## 2016-07-05 ENCOUNTER — Encounter: Payer: Self-pay | Admitting: Obstetrics & Gynecology

## 2016-07-05 ENCOUNTER — Ambulatory Visit (INDEPENDENT_AMBULATORY_CARE_PROVIDER_SITE_OTHER): Payer: 59 | Admitting: Obstetrics & Gynecology

## 2016-07-05 VITALS — BP 100/80 | HR 72 | Wt 229.4 lb

## 2016-07-05 DIAGNOSIS — Z3685 Encounter for antenatal screening for Streptococcus B: Secondary | ICD-10-CM

## 2016-07-05 DIAGNOSIS — Z3493 Encounter for supervision of normal pregnancy, unspecified, third trimester: Secondary | ICD-10-CM

## 2016-07-05 DIAGNOSIS — G935 Compression of brain: Secondary | ICD-10-CM

## 2016-07-05 DIAGNOSIS — Z23 Encounter for immunization: Secondary | ICD-10-CM | POA: Diagnosis not present

## 2016-07-05 DIAGNOSIS — Z1159 Encounter for screening for other viral diseases: Secondary | ICD-10-CM

## 2016-07-05 DIAGNOSIS — Z331 Pregnant state, incidental: Secondary | ICD-10-CM

## 2016-07-05 DIAGNOSIS — Z118 Encounter for screening for other infectious and parasitic diseases: Secondary | ICD-10-CM

## 2016-07-05 DIAGNOSIS — Z1389 Encounter for screening for other disorder: Secondary | ICD-10-CM

## 2016-07-05 LAB — POCT URINALYSIS DIPSTICK
Glucose, UA: NEGATIVE
KETONES UA: NEGATIVE
LEUKOCYTES UA: NEGATIVE
Nitrite, UA: NEGATIVE

## 2016-07-05 NOTE — Progress Notes (Signed)
G1P0000 [redacted]w[redacted]d Estimated Date of Delivery: 07/25/16  Blood pressure 100/80, pulse 72, weight 229 lb 6.4 oz (104.1 kg), last menstrual period 10/19/2015.   BP weight and urine results all reviewed and noted.  Please refer to the obstetrical flow sheet for the fundal height and fetal heart rate documentation:  Patient reports good fetal movement, denies any bleeding and no rupture of membranes symptoms or regular contractions. Patient is without complaints. All questions were answered.  Orders Placed This Encounter  Procedures  . GC/Chlamydia Probe Amp  . Strep Gp B NAA+Rflx  . Flu Vaccine QUAD 36+ mos IM  . POCT urinalysis dipstick    Plan:  Continued routine obstetrical care, OK for epidural, discussed with Dr Jillyn Hidden regarding pt and her symptoms  No Follow-up on file.

## 2016-07-06 LAB — OB RESULTS CONSOLE GBS: GBS: POSITIVE

## 2016-07-11 LAB — GC/CHLAMYDIA PROBE AMP

## 2016-07-12 ENCOUNTER — Encounter: Payer: Self-pay | Admitting: Women's Health

## 2016-07-12 ENCOUNTER — Ambulatory Visit (INDEPENDENT_AMBULATORY_CARE_PROVIDER_SITE_OTHER): Payer: 59 | Admitting: Women's Health

## 2016-07-12 VITALS — BP 136/80 | HR 84 | Wt 232.0 lb

## 2016-07-12 DIAGNOSIS — Z1389 Encounter for screening for other disorder: Secondary | ICD-10-CM

## 2016-07-12 DIAGNOSIS — G935 Compression of brain: Secondary | ICD-10-CM

## 2016-07-12 DIAGNOSIS — Z331 Pregnant state, incidental: Secondary | ICD-10-CM

## 2016-07-12 DIAGNOSIS — Z118 Encounter for screening for other infectious and parasitic diseases: Secondary | ICD-10-CM

## 2016-07-12 DIAGNOSIS — Z3403 Encounter for supervision of normal first pregnancy, third trimester: Secondary | ICD-10-CM

## 2016-07-12 DIAGNOSIS — Z1159 Encounter for screening for other viral diseases: Secondary | ICD-10-CM

## 2016-07-12 LAB — POCT URINALYSIS DIPSTICK
Glucose, UA: NEGATIVE
KETONES UA: NEGATIVE
Nitrite, UA: NEGATIVE

## 2016-07-12 LAB — STREP GP B NAA+RFLX: STREP GP B NAA+RFLX: POSITIVE — AB

## 2016-07-12 LAB — STREP GP B SUSCEPTIBILITY

## 2016-07-12 NOTE — Patient Instructions (Addendum)
Call the office [phone]) or go to [facility] if:  You begin to have strong, frequent contractions  Your water breaks.  Sometimes it is a big gush of fluid, sometimes it is just a trickle that keeps getting your panties wet or running down your legs  You have vaginal bleeding.  It is normal to have a small amount of spotting if your cervix was checked.   You don't feel your baby moving like normal.  If you don't, get you something to eat and drink and lay down and focus on feeling your baby move.  You should feel at least 10 movements in 2 hours.  If you don't, you should call the office or go to [place] Contractions Contractions of the uterus can occur throughout pregnancy. Contractions are not always a sign that you are in labor.  WHAT ARE BRAXTON HICKS CONTRACTIONS?  Contractions that occur before labor are called Braxton Hicks contractions, or false labor. Toward the end of pregnancy (32-34 weeks), these contractions can develop more often and may become more forceful. This is not true labor because these contractions do not result in opening (dilatation) and thinning of the cervix. They are sometimes difficult to tell apart from true labor because these contractions can be forceful and people have different pain tolerances. You should not feel embarrassed if you go to the hospital with false labor. Sometimes, the only way to tell if you are in true labor is for your health care provider to look for changes in the cervix. If there are no prenatal problems or other health problems associated with the pregnancy, it is completely safe to be sent home with false labor and await the onset of true labor. HOW CAN YOU TELL THE DIFFERENCE BETWEEN TRUE AND FALSE LABOR? False Labor  The contractions of false labor are usually shorter and not as hard as those of true labor.   The contractions are usually irregular.   The contractions are often felt in the front of the  lower abdomen and in the groin.   The contractions may go away when you walk around or change positions while lying down.   The contractions get weaker and are shorter lasting as time goes on.   The contractions do not usually become progressively stronger, regular, and closer together as with true labor.  True Labor  Contractions in true labor last 30-70 seconds, become very regular, usually become more intense, and increase in frequency.   The contractions do not go away with walking.   The discomfort is usually felt in the top of the uterus and spreads to the lower abdomen and low back.   True labor can be determined by your health care provider with an exam. This will show that the cervix is dilating and getting thinner.  WHAT TO REMEMBER  Keep up with your usual exercises and follow other instructions given by your health care provider.   Take medicines as directed by your health care provider.   Keep your regular prenatal appointments.   Eat and drink lightly if you think you are going into labor.   If Braxton Hicks contractions are making you uncomfortable:   Change your position from lying down or resting to walking, or from walking to resting.   Sit and rest in a tub of warm water.   Drink 2-3 glasses of water. Dehydration may cause these contractions.   Do slow and deep breathing several times an hour.  WHEN SHOULD I SEEK IMMEDIATE MEDICAL CARE? Seek immediate medical care if:  Your contractions become stronger, more regular, and closer together.   You have fluid leaking or gushing from your vagina.   You have a fever.   You pass blood-tinged mucus.   You have vaginal bleeding.   You have continuous abdominal pain.   You have low back pain that you never had before.   You feel your baby's head pushing down and causing pelvic pressure.   Your baby is not moving as much as it used to.    This information is not intended to  replace advice given to you by your health care provider. Make sure you discuss any questions you have with your health care provider.   Document Released: 09/26/2005 Document Revised: 10/01/2013 Document Reviewed: 07/08/2013 Elsevier Interactive Patient Education [employer].

## 2016-07-12 NOTE — Progress Notes (Signed)
Low-risk OB appointment G1P0000 [redacted]w[redacted]d Estimated Date of Delivery: 07/25/16 BP 136/80   Pulse 84   Wt 232 lb (105.2 kg)   LMP 10/19/2015 (Exact Date)   BMI 39.82 kg/m   BP, weight, and urine reviewed.  Refer to obstetrical flow sheet for FH & FHR.  Reports good fm.  Denies regular uc's, lof, vb, or uti s/s. No complaints. Declines SVE Reviewed labor s/s, fkc. GBS+, not resistant to clinda per sensitivities Plan:  Continue routine obstetrical care  F/U in 1wk for OB appointment

## 2016-07-16 LAB — GC/CHLAMYDIA PROBE AMP
CHLAMYDIA, DNA PROBE: NEGATIVE
NEISSERIA GONORRHOEAE BY PCR: NEGATIVE

## 2016-07-19 ENCOUNTER — Encounter: Payer: Self-pay | Admitting: Advanced Practice Midwife

## 2016-07-19 ENCOUNTER — Ambulatory Visit (INDEPENDENT_AMBULATORY_CARE_PROVIDER_SITE_OTHER): Payer: 59 | Admitting: Advanced Practice Midwife

## 2016-07-19 VITALS — BP 130/80 | HR 72 | Wt 233.0 lb

## 2016-07-19 DIAGNOSIS — Z3403 Encounter for supervision of normal first pregnancy, third trimester: Secondary | ICD-10-CM

## 2016-07-19 DIAGNOSIS — Z331 Pregnant state, incidental: Secondary | ICD-10-CM

## 2016-07-19 DIAGNOSIS — Z1389 Encounter for screening for other disorder: Secondary | ICD-10-CM

## 2016-07-19 LAB — POCT URINALYSIS DIPSTICK
GLUCOSE UA: NEGATIVE
Ketones, UA: NEGATIVE
Nitrite, UA: NEGATIVE

## 2016-07-19 MED ORDER — PRENATAL PLUS 27-1 MG PO TABS: 1.0000 | ORAL_TABLET | Freq: Every day | ORAL | 11 refills | Status: DC

## 2016-07-19 NOTE — Patient Instructions (Signed)

## 2016-07-19 NOTE — Progress Notes (Signed)
G1P0000 [redacted]w[redacted]d Estimated Date of Delivery: 07/25/16  Last menstrual period 10/19/2015.   BP weight and urine results all reviewed and noted.  Please refer to the obstetrical flow sheet for the fundal height and fetal heart rate documentation:  Patient reports good fetal movement, denies any bleeding and no rupture of membranes symptoms or regular contractions. Patient is without complaints. All questions were answered.  Orders Placed This Encounter  Procedures  . POCT urinalysis dipstick    Plan:  Continued routine obstetrical care,   Return in about 1 week (around 07/26/2016) for LROB.

## 2016-07-26 ENCOUNTER — Encounter: Payer: Self-pay | Admitting: Obstetrics & Gynecology

## 2016-07-26 ENCOUNTER — Ambulatory Visit (INDEPENDENT_AMBULATORY_CARE_PROVIDER_SITE_OTHER): Payer: 59 | Admitting: Obstetrics & Gynecology

## 2016-07-26 VITALS — BP 122/90 | HR 78 | Wt 234.0 lb

## 2016-07-26 DIAGNOSIS — O480 Post-term pregnancy: Secondary | ICD-10-CM

## 2016-07-26 DIAGNOSIS — Z331 Pregnant state, incidental: Secondary | ICD-10-CM

## 2016-07-26 DIAGNOSIS — Z3A41 41 weeks gestation of pregnancy: Secondary | ICD-10-CM

## 2016-07-26 DIAGNOSIS — Z1389 Encounter for screening for other disorder: Secondary | ICD-10-CM

## 2016-07-26 DIAGNOSIS — Z3403 Encounter for supervision of normal first pregnancy, third trimester: Secondary | ICD-10-CM | POA: Diagnosis not present

## 2016-07-26 LAB — POCT URINALYSIS DIPSTICK
Blood, UA: 1
Glucose, UA: NEGATIVE
Ketones, UA: NEGATIVE
Leukocytes, UA: NEGATIVE
NITRITE UA: NEGATIVE
PROTEIN UA: 1

## 2016-07-26 NOTE — Progress Notes (Signed)
G1P0000 [redacted]w[redacted]d Estimated Date of Delivery: 07/25/16  Blood pressure 122/90, pulse 78, weight 234 lb (106.1 kg), last menstrual period 10/19/2015.   BP weight and urine results all reviewed and noted.  Please refer to the obstetrical flow sheet for the fundal height and fetal heart rate documentation:  Patient reports good fetal movement, denies any bleeding and no rupture of membranes symptoms or regular contractions. Patient is without complaints. All questions were answered.  Orders Placed This Encounter  Procedures  . POCT urinalysis dipstick    Plan:  Continued routine obstetrical care, post dates induction: 08/01/2016@0730   Return in about 6 weeks (around 09/06/2016) for post partum visit.

## 2016-07-26 NOTE — Treatment Plan (Signed)
   Induction Assessment Scheduling Form: [facility]ax to [facility]:  [id number]  Janet [name]                                                                                   DOB:  1986-08-06                                                            MRN:  [id number]                                                                     Phone #:   [phone]                         Provider:  [facility]  GP:  G1P0000                                                            Estimated Date of Delivery: 07/25/16  Dating Criteria: first trimester sonogram    Medical Indications for induction:  Post dates Admission Date/Time:  08/01/2016 @0730  Gestational age on admission:  [redacted]w[redacted]d   Filed Weights   07/26/16 1509  Weight: 234 lb (106.1 kg)   HIV:  Non Reactive (07/24 1038) [id number]: treat with clindamycin    0 cm dilated, 0% effaced, -2 station, cervical position posterior, consistency medium, Bishop score 1, presenting part vertex   Method of induction(proposed):  cytotec   Scheduling Provider Signature:  Florian Buff, MD                                            Today's Date:  07/26/2016   Scheduled with [name]

## 2016-07-27 ENCOUNTER — Telehealth (HOSPITAL_COMMUNITY): Payer: Self-pay | Admitting: *Deleted

## 2016-07-27 ENCOUNTER — Encounter (HOSPITAL_COMMUNITY): Payer: Self-pay | Admitting: *Deleted

## 2016-07-27 NOTE — Telephone Encounter (Signed)
Preadmission screen  

## 2016-07-29 ENCOUNTER — Encounter: Payer: Self-pay | Admitting: Women's Health

## 2016-07-30 ENCOUNTER — Encounter (HOSPITAL_COMMUNITY): Payer: Self-pay | Admitting: *Deleted

## 2016-07-30 ENCOUNTER — Inpatient Hospital Stay (HOSPITAL_COMMUNITY)
Admission: AD | Admit: 2016-07-30 | Discharge: 2016-08-03 | DRG: 774 | Disposition: A | Discharge status: Home / Self Care | Payer: 59 | Source: Ambulatory Visit | Attending: Family Medicine | Admitting: Family Medicine

## 2016-07-30 DIAGNOSIS — Z3A40 40 weeks gestation of pregnancy: Secondary | ICD-10-CM

## 2016-07-30 DIAGNOSIS — Z6841 Body Mass Index (BMI) 40.0 and over, adult: Secondary | ICD-10-CM | POA: Diagnosis not present

## 2016-07-30 DIAGNOSIS — O99214 Obesity complicating childbirth: Secondary | ICD-10-CM | POA: Diagnosis present

## 2016-07-30 DIAGNOSIS — Z823 Family history of stroke: Secondary | ICD-10-CM

## 2016-07-30 DIAGNOSIS — Z880 Allergy status to penicillin: Secondary | ICD-10-CM

## 2016-07-30 DIAGNOSIS — O99824 Streptococcus B carrier state complicating childbirth: Secondary | ICD-10-CM

## 2016-07-30 DIAGNOSIS — O480 Post-term pregnancy: Secondary | ICD-10-CM | POA: Diagnosis present

## 2016-07-30 DIAGNOSIS — Z3403 Encounter for supervision of normal first pregnancy, third trimester: Secondary | ICD-10-CM | POA: Diagnosis present

## 2016-07-30 DIAGNOSIS — Z8249 Family history of ischemic heart disease and other diseases of the circulatory system: Secondary | ICD-10-CM

## 2016-07-30 DIAGNOSIS — K219 Gastro-esophageal reflux disease without esophagitis: Secondary | ICD-10-CM | POA: Diagnosis present

## 2016-07-30 DIAGNOSIS — O1494 Unspecified pre-eclampsia, complicating childbirth: Principal | ICD-10-CM | POA: Diagnosis present

## 2016-07-30 DIAGNOSIS — Z833 Family history of diabetes mellitus: Secondary | ICD-10-CM | POA: Diagnosis not present

## 2016-07-30 DIAGNOSIS — G935 Compression of brain: Secondary | ICD-10-CM | POA: Diagnosis present

## 2016-07-30 DIAGNOSIS — O1490 Unspecified pre-eclampsia, unspecified trimester: Secondary | ICD-10-CM | POA: Diagnosis present

## 2016-07-30 DIAGNOSIS — O9962 Diseases of the digestive system complicating childbirth: Secondary | ICD-10-CM

## 2016-07-30 LAB — COMPREHENSIVE METABOLIC PANEL
ALT: 15 U/L (ref 14–54)
ANION GAP: 8 (ref 5–15)
AST: 24 U/L (ref 15–41)
Albumin: 2.7 g/dL — ABNORMAL LOW (ref 3.5–5.0)
Alkaline Phosphatase: 158 U/L — ABNORMAL HIGH (ref 38–126)
BUN: 7 mg/dL (ref 6–20)
CALCIUM: 9.2 mg/dL (ref 8.9–10.3)
CO2: 22 mmol/L (ref 22–32)
Chloride: 106 mmol/L (ref 101–111)
Creatinine, Ser: 0.54 mg/dL (ref 0.44–1.00)
Glucose, Bld: 94 mg/dL (ref 65–99)
Potassium: 3.9 mmol/L (ref 3.5–5.1)
SODIUM: 136 mmol/L (ref 135–145)
TOTAL PROTEIN: 6.2 g/dL — AB (ref 6.5–8.1)
Total Bilirubin: 0.3 mg/dL (ref 0.3–1.2)

## 2016-07-30 LAB — CBC
HCT: 35 % — ABNORMAL LOW (ref 36.0–46.0)
Hemoglobin: 12.3 g/dL (ref 12.0–15.0)
MCH: 29.6 pg (ref 26.0–34.0)
MCHC: 35.1 g/dL (ref 30.0–36.0)
MCV: 84.1 fL (ref 78.0–100.0)
PLATELETS: 297 10*3/uL (ref 150–400)
RBC: 4.16 MIL/uL (ref 3.87–5.11)
RDW: 14.6 % (ref 11.5–15.5)
WBC: 8.7 10*3/uL (ref 4.0–10.5)

## 2016-07-30 LAB — PROTEIN / CREATININE RATIO, URINE
CREATININE, URINE: 148 mg/dL
PROTEIN CREATININE RATIO: 0.33 mg/mg{creat} — AB (ref 0.00–0.15)
TOTAL PROTEIN, URINE: 49 mg/dL

## 2016-07-30 LAB — TYPE AND SCREEN
ABO/RH(D): B POS
Antibody Screen: NEGATIVE

## 2016-07-30 LAB — POCT FERN TEST: POCT Fern Test: NEGATIVE

## 2016-07-30 MED ORDER — ACETAMINOPHEN 325 MG PO TABS: 650.0000 mg | ORAL_TABLET | ORAL | Status: DC | PRN

## 2016-07-30 MED ORDER — FLEET ENEMA 7-19 GM/118ML RE ENEM: 1.0000 | ENEMA | RECTAL | Status: DC | PRN

## 2016-07-30 MED ORDER — LIDOCAINE HCL (PF) 1 % IJ SOLN
30.0000 mL | INTRAMUSCULAR | Status: DC | PRN
  Filled 2016-07-30: qty 30

## 2016-07-30 MED ORDER — OXYCODONE-ACETAMINOPHEN 5-325 MG PO TABS: 1.0000 | ORAL_TABLET | ORAL | Status: DC | PRN

## 2016-07-30 MED ORDER — ONDANSETRON HCL 4 MG/2ML IJ SOLN
4.0000 mg | Freq: Four times a day (QID) | INTRAMUSCULAR | Status: DC | PRN
  Administered 2016-08-01: 4 mg via INTRAVENOUS
  Filled 2016-07-30: qty 2

## 2016-07-30 MED ORDER — MISOPROSTOL 25 MCG QUARTER TABLET
25.0000 ug | ORAL_TABLET | ORAL | Status: DC | PRN
  Administered 2016-07-30 – 2016-07-31 (×3): 25 ug via VAGINAL
  Filled 2016-07-30: qty 0.25
  Filled 2016-07-30: qty 1
  Filled 2016-07-30 (×2): qty 0.25

## 2016-07-30 MED ORDER — TERBUTALINE SULFATE 1 MG/ML IJ SOLN
0.2500 mg | Freq: Once | INTRAMUSCULAR | Status: DC | PRN
Start: 2016-07-30 — End: 2016-08-01
  Filled 2016-07-30: qty 1

## 2016-07-30 MED ORDER — FENTANYL CITRATE (PF) 100 MCG/2ML IJ SOLN: 100.0000 ug | INTRAMUSCULAR | Status: DC | PRN

## 2016-07-30 MED ORDER — OXYCODONE-ACETAMINOPHEN 5-325 MG PO TABS: 2.0000 | ORAL_TABLET | ORAL | Status: DC | PRN

## 2016-07-30 MED ORDER — LACTATED RINGERS IV SOLN
INTRAVENOUS | Status: DC
  Administered 2016-07-31 (×3): via INTRAVENOUS

## 2016-07-30 MED ORDER — OXYTOCIN 40 UNITS IN LACTATED RINGERS INFUSION - SIMPLE MED
2.5000 [IU]/h | INTRAVENOUS | Status: DC
  Administered 2016-08-01: 2.5 [IU]/h via INTRAVENOUS

## 2016-07-30 MED ORDER — LACTATED RINGERS IV SOLN
500.0000 mL | INTRAVENOUS | Status: DC | PRN
  Administered 2016-07-31: 250 mL via INTRAVENOUS

## 2016-07-30 MED ORDER — OXYTOCIN BOLUS FROM INFUSION
500.0000 mL | Freq: Once | INTRAVENOUS | Status: AC
  Administered 2016-08-01: 500 mL via INTRAVENOUS

## 2016-07-30 MED ORDER — SOD CITRATE-CITRIC ACID 500-334 MG/5ML PO SOLN: 30.0000 mL | ORAL | Status: DC | PRN

## 2016-07-30 MED ORDER — CLINDAMYCIN PHOSPHATE 900 MG/50ML IV SOLN
900.0000 mg | Freq: Three times a day (TID) | INTRAVENOUS | Status: DC
  Administered 2016-07-31 – 2016-08-01 (×3): 900 mg via INTRAVENOUS
  Filled 2016-07-30 (×6): qty 50

## 2016-07-30 NOTE — H&P (Signed)
LABOR AND DELIVERY ADMISSION HISTORY AND PHYSICAL NOTE  Janet [name] is a 30 y.o. female G1P0000 with IUP at [redacted]w[redacted]d by 9 wk [place] presenting for contractions. In MAU pt was found to have elevated BP and BP 150/100s. Pr/Cr ratio found to be 0.33, remainder of PIH labs wnl. No headache, blurred vision, RUQ pain or abnormal swelling. No SOB.   She reports positive fetal movement. She denies leakage of fluid or vaginal bleeding.  Prenatal History/Complications:  Past Medical History: Past Medical History:  Diagnosis Date  . 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 Carpal tunnel syndrome during pregnancy   . 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 Cushing's syndrome ([place]) 11/27/2013  . Gastroesophageal reflux disease   . Hematuria 09/18/2015  . Menstrual periods irregular 09/18/2015  . Migraine without aura, with intractable migraine, so stated, without mention of status migrainosus 11/11/2013  . Patient desires pregnancy 11/27/2013  . Pregnant 12/16/2015  . Vitamin D deficiency     Past Surgical History: Past Surgical History:  Procedure Laterality Date  . CHOLECYSTECTOMY  02/2015  . COLONOSCOPY N/A 04/19/2013   [id number] mucosa in the terminal ileum/Single erosion in transverse colon/RECTAL BLEEDING DUE TO Moderate sized internal hemorrhoids/ trv colon erosion, bx benign.  . ESOPHAGOGASTRODUODENOSCOPY N/A 04/19/2013   [id number] Non-erosive gastritis/PERI-UMBILICAL PAIN DUE TO GERD, GASTRITIS, AND CONSTIPATION. Bx with mild chronic inactive gastritis. No H.Pylori  . HEMORRHOID BANDING  2015   Dr. Gala Romney- in office banding procedure  . TONSILLECTOMY    . 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 Marital status: Single    Spouse name: N/A  . Number of children: 0  . Years of education: N/A   Occupational History  .  [employer]   Social History Main Topics  . Smoking status: Never Smoker  . Smokeless tobacco: Never Used  . Alcohol use No  . Drug use: No  . Sexual activity: Not Currently    Birth control/ protection: None   Other Topics Concern  . None   Social History Narrative  . None    Family History: Family History  Problem Relation Age of Onset  . Hypertension Mother   . Hyperlipidemia Mother   . Hypertension Father   . Hypertension Maternal Grandmother   . Diabetes Maternal Grandmother   . Diabetes Paternal Grandmother   . COPD Paternal Grandfather   . Heart disease Paternal Grandfather   . Colon cancer Other     maternal great uncle  . Stroke Maternal Aunt   . Migraines Maternal Aunt     Allergies: Allergies  Allergen Reactions  . Penicillins Anaphylaxis and Rash  . Latex     Pt states this is a sensitivity, not an allergy  . Methylprednisolone Rash    Prescriptions Prior to Admission  Medication Sig Dispense Refill Last Dose  . ferrous sulfate 325 (65 FE) MG tablet Take 1 tablet (325 mg total) by mouth 2 (two) times daily with a meal. 60 tablet 3 07/30/2016  . pantoprazole (PROTONIX) 40 MG  tablet Take 1 tablet (40 mg total) by mouth daily. 30 tablet 6 07/30/2016  . prenatal vitamin w/FE, FA (PRENATAL 1 + 1) 27-1 MG TABS tablet Take 1 tablet by mouth daily at 12 noon. 30 each 11 07/30/2016     Review of Systems   All systems reviewed and negative except as stated in HPI  Blood pressure (!) 141/90, pulse 82, temperature 98.2 F (36.8 C), temperature source Oral, resp. rate 20, height 5\' 4"  (1.626 m), weight 234 lb (106.1 kg), last menstrual period 10/19/2015. General appearance: alert, cooperative and appears stated age Lungs: clear to auscultation bilaterally Heart: regular rate and rhythm Abdomen: soft, non-tender;  bowel sounds normal Extremities: No calf swelling or tenderness Presentation: cephalic by exam Fetal monitoring: category 1 Uterine activity: contraction every 5-7 minutes Dilation: Closed Effacement (%): Thick Station: -3 Exam by:: CGoodnight,RN    Prenatal labs: ABO, Rh: --/--/B POS (10/21 1955) Antibody: PENDING (10/21 1955) Rubella: immune RPR: Non Reactive (07/24 1038)  HBsAg: Negative (04/04 1701)  HIV: Non Reactive (07/24 1038)  GBS: Positive (09/27 0000)    Prenatal Transfer Tool  Maternal Diabetes: No Genetic Screening: Declined Maternal Ultrasounds/Referrals: Normal Fetal Ultrasounds or other Referrals:  None Maternal Substance Abuse:  No Significant Maternal Medications:  None Significant Maternal Lab Results: Lab values include: Group B Strep positive  Results for orders placed or performed during the hospital encounter of 07/30/16 (from the past 24 hour(s))  Comprehensive metabolic panel   Collection Time: 07/30/16  7:55 PM  Result Value Ref Range   Sodium 136 135 - 145 mmol/L   Potassium 3.9 3.5 - 5.1 mmol/L   Chloride 106 101 - 111 mmol/L   CO2 22 22 - 32 mmol/L   Glucose, Bld 94 65 - 99 mg/dL   BUN 7 6 - 20 mg/dL   Creatinine, Ser 0.54 0.44 - 1.00 mg/dL   Calcium 9.2 8.9 - 10.3 mg/dL   Total Protein 6.2 (L) 6.5 - 8.1 g/dL   Albumin 2.7 (L) 3.5 - 5.0 g/dL   AST 24 15 - 41 U/L   ALT 15 14 - 54 U/L   Alkaline Phosphatase 158 (H) 38 - 126 U/L   Total Bilirubin 0.3 0.3 - 1.2 mg/dL   GFR calc non Af Amer >60 >60 mL/min   GFR calc Af Amer >60 >60 mL/min   Anion gap 8 5 - 15  Protein / creatinine ratio, urine   Collection Time: 07/30/16  7:55 PM  Result Value Ref Range   Creatinine, Urine 148.00 mg/dL   Total Protein, Urine 49 mg/dL   Protein Creatinine Ratio 0.33 (H) 0.00 - 0.15 mg/mg[Cre]  CBC   Collection Time: 07/30/16  7:55 PM  Result Value Ref Range   WBC 8.7 4.0 - 10.5 K/uL   RBC 4.16 3.87 - 5.11 MIL/uL   Hemoglobin 12.3 12.0 - 15.0 g/dL    HCT 35.0 (L) 36.0 - 46.0 %   MCV 84.1 78.0 - 100.0 fL   MCH 29.6 26.0 - 34.0 pg   MCHC 35.1 30.0 - 36.0 g/dL   RDW 14.6 11.5 - 15.5 %   Platelets 297 150 - 400 K/uL  Type and screen [place]   Collection Time: 07/30/16  7:55 PM  Result Value Ref Range   ABO/RH(D) B POS    Antibody Screen PENDING    Sample Expiration 08/02/2016   Fern Test   Collection Time: 07/30/16  8:03 PM  Result Value Ref Range  POCT Fern Test Negative = intact amniotic membranes     Patient Active Problem List   Diagnosis Date Noted  . Gestational hypertension 07/30/2016  . Borderline diabetes   . Supervision of normal pregnancy 01/12/2016  . Menstrual periods irregular 09/18/2015  . Hematuria 09/18/2015  . Internal hemorrhoids with other complication [id number]  . Chiari malformation type I ([place]) 11/27/2013  . Migraine without aura, with intractable migraine, so stated, without mention of status migrainosus 11/11/2013  . Abdominal pain, chronic, right lower quadrant 08/16/2013  . Unspecified constipation 08/16/2013  . Headache(784.0) 07/09/2013  . Bowel habit changes 03/22/2013  . BRBPR (bright red blood per rectum) 03/22/2013  . RUQ pain 03/22/2013  . Abdominal pain, epigastric 03/22/2013  . Esophageal dysphagia 03/22/2013  . Unspecified vitamin D deficiency 03/06/2013  . GERD (gastroesophageal reflux disease) 03/06/2013  . OBESITY 07/07/2010  . SINUS ARRHYTHMIA 07/07/2010  . ANEMIA 06/23/2010    Assessment: Janet [name] is a 30 y.o. G1P0000 at [redacted]w[redacted]d here for IOL for PRe-eclampsia w/o severe features  #Labor:pt is closed, was set for postdates induction Monday. Plan for cytotec #Pain: Pt wants to attempt with out epidural, but understands induction can be long and painful. Will wait to see how she tolerated induction #FWB: Category 1 #ID:  GBS pos - clinda, PCN allergy with anaphylaxis #MOF: breast #MOC:POP #Circ:  N/A # pre-eclampsia. PR/CR ratio 0.33 with  elevate BP, one severe range pressure. If reamin elevate will start magnesium.  Jacquiline Doe 07/30/2016, 10:11 PM

## 2016-07-31 ENCOUNTER — Inpatient Hospital Stay (HOSPITAL_COMMUNITY): Payer: 59 | Admitting: Anesthesiology

## 2016-07-31 LAB — CBC
HCT: 37.1 % (ref 36.0–46.0)
HEMOGLOBIN: 13 g/dL (ref 12.0–15.0)
MCH: 29.4 pg (ref 26.0–34.0)
MCHC: 35 g/dL (ref 30.0–36.0)
MCV: 83.9 fL (ref 78.0–100.0)
Platelets: 266 10*3/uL (ref 150–400)
RBC: 4.42 MIL/uL (ref 3.87–5.11)
RDW: 14.5 % (ref 11.5–15.5)
WBC: 9.8 10*3/uL (ref 4.0–10.5)

## 2016-07-31 LAB — RPR: RPR: NONREACTIVE

## 2016-07-31 LAB — ABO/RH: ABO/RH(D): B POS

## 2016-07-31 MED ORDER — PHENYLEPHRINE 40 MCG/ML (10ML) SYRINGE FOR IV PUSH (FOR BLOOD PRESSURE SUPPORT)
80.0000 ug | PREFILLED_SYRINGE | INTRAVENOUS | Status: DC | PRN
  Filled 2016-07-31: qty 5

## 2016-07-31 MED ORDER — TERBUTALINE SULFATE 1 MG/ML IJ SOLN
0.2500 mg | Freq: Once | INTRAMUSCULAR | Status: DC | PRN
  Filled 2016-07-31: qty 1

## 2016-07-31 MED ORDER — EPHEDRINE 5 MG/ML INJ
10.0000 mg | INTRAVENOUS | Status: DC | PRN
  Filled 2016-07-31: qty 4

## 2016-07-31 MED ORDER — LIDOCAINE HCL (PF) 1 % IJ SOLN
INTRAMUSCULAR | Status: DC | PRN
  Administered 2016-07-31 (×2): 5 mL

## 2016-07-31 MED ORDER — FENTANYL 2.5 MCG/ML BUPIVACAINE 1/10 % EPIDURAL INFUSION (WH - ANES)
14.0000 mL/h | INTRAMUSCULAR | Status: DC | PRN
  Administered 2016-07-31 – 2016-08-01 (×2): 14 mL/h via EPIDURAL
  Filled 2016-07-31 (×2): qty 125

## 2016-07-31 MED ORDER — LACTATED RINGERS IV SOLN: 500.0000 mL | Freq: Once | INTRAVENOUS | Status: DC

## 2016-07-31 MED ORDER — OXYTOCIN 40 UNITS IN LACTATED RINGERS INFUSION - SIMPLE MED
1.0000 m[IU]/min | INTRAVENOUS | Status: DC
  Administered 2016-07-31: 2 m[IU]/min via INTRAVENOUS
  Filled 2016-07-31 (×2): qty 1000

## 2016-07-31 MED ORDER — PHENYLEPHRINE 40 MCG/ML (10ML) SYRINGE FOR IV PUSH (FOR BLOOD PRESSURE SUPPORT)
80.0000 ug | PREFILLED_SYRINGE | INTRAVENOUS | Status: DC | PRN
  Filled 2016-07-31: qty 5
  Filled 2016-07-31: qty 10

## 2016-07-31 MED ORDER — DIPHENHYDRAMINE HCL 50 MG/ML IJ SOLN: 12.5000 mg | INTRAMUSCULAR | Status: DC | PRN

## 2016-07-31 NOTE — Anesthesia Preprocedure Evaluation (Signed)
Anesthesia Evaluation  Patient identified by MRN, date of birth, ID band Patient awake    Reviewed: Allergy & Precautions, H&P , NPO status , Patient's Chart, lab work & pertinent test results  History of Anesthesia Complications Negative for: history of anesthetic complications  Airway Mallampati: II  TM Distance: >3 FB Neck ROM: full    Dental no notable dental hx. (+) Teeth Intact   Pulmonary neg pulmonary ROS,    Pulmonary exam normal breath sounds clear to auscultation       Cardiovascular negative cardio ROS Normal cardiovascular exam Rhythm:regular Rate:Normal     Neuro/Psych Chiari malformation type I ([facility]) negative neurological ROS  negative psych ROS   GI/Hepatic negative GI ROS, Neg liver ROS,   Endo/Other  Morbid obesity  Renal/GU negative Renal ROS  negative genitourinary   Musculoskeletal   Abdominal   Peds  Hematology negative hematology ROS (+)   Anesthesia Other Findings   Reproductive/Obstetrics (+) Pregnancy                             Anesthesia Physical Anesthesia Plan  ASA: II  Anesthesia Plan: Epidural   Post-op Pain Management:    Induction:   Airway Management Planned:   Additional Equipment:   Intra-op Plan:   Post-operative Plan:   Informed Consent: I have reviewed the patients History and Physical, chart, labs and discussed the procedure including the risks, benefits and alternatives for the proposed anesthesia with the patient or authorized representative who has indicated his/her understanding and acceptance.     Plan Discussed with:   Anesthesia Plan Comments:         Anesthesia Quick Evaluation

## 2016-07-31 NOTE — Anesthesia Procedure Notes (Signed)
Epidural Patient location during procedure: OB  Staffing Anesthesiologist: Montez Hageman Performed: anesthesiologist   Preanesthetic Checklist Completed: patient identified, site marked, surgical consent, pre-op evaluation, timeout performed, IV checked, risks and benefits discussed and monitors and equipment checked  Epidural Patient position: sitting Prep: DuraPrep Patient monitoring: heart rate, continuous pulse ox and blood pressure Approach: right paramedian Location: L3-L4 Injection technique: LOR saline  Needle:  Needle type: Tuohy  Needle gauge: 17 G Needle length: 9 cm and 9 Needle insertion depth: 9 cm Catheter type: closed end flexible Catheter size: 20 Guage Catheter at skin depth: 13 cm Test dose: negative  Assessment Events: blood not aspirated, injection not painful, no injection resistance, negative IV test and no paresthesia  Additional Notes Patient identified. Risks/Benefits/Options discussed with patient including but not limited to bleeding, infection, nerve damage, paralysis, failed block, incomplete pain control, headache, blood pressure changes, nausea, vomiting, reactions to medication both or allergic, itching and postpartum back pain. Confirmed with bedside nurse the patient's most recent platelet count. Confirmed with patient that they are not currently taking any anticoagulation, have any bleeding history or any family history of bleeding disorders. Patient expressed understanding and wished to proceed. All questions were answered. Sterile technique was used throughout the entire procedure. Please see nursing notes for vital signs. Test dose was given through epidural needle and negative prior to continuing to dose epidural or start infusion. Warning signs of high block given to the patient including shortness of breath, tingling/numbness in hands, complete motor block, or any concerning symptoms with instructions to call for help. Patient was given  instructions on fall risk and not to get out of bed. All questions and concerns addressed with instructions to call with any issues.

## 2016-07-31 NOTE — Progress Notes (Signed)
Janet [name] is a 30 y.o. G1P0000 at [redacted]w[redacted]d by ultrasound admitted for induction of labor due to preeclampsia. Pt denies any HA, blurred vision, RUQ pain, or SOB.   Subjective:   Objective: BP (!) 142/74 (BP Location: Left Arm)   Pulse 88   Temp 98.2 F (36.8 C) (Oral)   Resp 16   Ht 5\' 4"  (1.626 m)   Wt 234 lb (106.1 kg)   LMP 10/19/2015 (Exact Date)   BMI 40.17 kg/m  No intake/output data recorded. No intake/output data recorded.  FHT:  FHR: 130's bpm, variability: moderate,  accelerations:  Present,  decelerations:  Absent UC:   irregular, every 3-5 minutes SVE:   Dilation: Fingertip Effacement (%): Thick Station: -2 Exam by:: Juliane Lack, RN  Labs: Lab Results  Component Value Date   WBC 8.7 07/30/2016   HGB 12.3 07/30/2016   HCT 35.0 (L) 07/30/2016   MCV 84.1 07/30/2016   PLT 297 07/30/2016    Assessment / Plan: IOL, not yet in labor   Labor: not yet in labor Preeclampsia:  no signs or symptoms of toxicity, intake and ouput balanced and labs stable Fetal Wellbeing:  Category I Pain Control:  Labor support without medications I/D:  n/a Anticipated MOD:  NSVD  Koren Shiver 07/31/2016, 8:50 AM

## 2016-07-31 NOTE — Anesthesia Pain Management Evaluation Note (Signed)
  CRNA Pain Management Visit Note  Patient: Janet [name], 30 y.o., female  "Hello I am a member of the anesthesia team at [facility]. We have an anesthesia team available at all times to provide care throughout the hospital, including epidural management and anesthesia for C-section. I don't know your plan for the delivery whether it a natural birth, water birth, IV sedation, nitrous supplementation, doula or epidural, but we want to meet your pain goals."   1.Was your pain managed to your expectations on prior hospitalizations?   No prior hospitalizations  2.What is your expectation for pain management during this hospitalization?     Epidural, IV pain meds and Nitrous Oxide  3.How can we help you reach that goal? flexible  Record the patient's initial score and the patient's pain goal.   Pain: 1  Pain Goal: 6 [facility] wants you to be able to say your pain was always managed very well.  Katarina Riebe 07/31/2016

## 2016-07-31 NOTE — Progress Notes (Addendum)
Janet [name] is a 30 y.o. G1P0000 at [redacted]w[redacted]d by ultrasound admitted for induction of labor due to preeclampsia.  Subjective:   Objective: BP (!) 155/87   Pulse 80   Temp 98.2 F (36.8 C) (Oral)   Resp 18   Ht 5\' 4"  (1.626 m)   Wt 234 lb (106.1 kg)   LMP 10/19/2015 (Exact Date)   BMI 40.17 kg/m  No intake/output data recorded. No intake/output data recorded.  FHT:  FHR: 150's bpm, variability: moderate,  accelerations:  Present,  decelerations:  Absent UC:   irregular, every 2-4 minutes SVE:   Dilation: 1 Effacement (%): 50 Station: -3 Exam by:: Oletha Blend CNM  Labs: Lab Results  Component Value Date   WBC 8.7 07/30/2016   HGB 12.3 07/30/2016   HCT 35.0 (L) 07/30/2016   MCV 84.1 07/30/2016   PLT 297 07/30/2016    Assessment / Plan: IOL, foley bulb inserted  Labor: not yet in labor; will augment with oxytocin Preeclampsia:  no signs or symptoms of toxicity, intake and ouput balanced and labs stable Fetal Wellbeing:  Category I Pain Control:  Labor support without medications I/D:  n/a Anticipated MOD:  NSVD  Koren Shiver 07/31/2016, 11:02 AM

## 2016-07-31 NOTE — Progress Notes (Signed)
Janet [name] is a 30 y.o. G1P0000 at [redacted]w[redacted]d by ultrasound admitted for induction of labor due to preeclampsia.  Subjective:   Objective: BP (!) 129/92   Pulse 82   Temp 97.9 F (36.6 C) (Oral)   Resp 16   Ht 5\' 4"  (1.626 m)   Wt 234 lb (106.1 kg)   LMP 10/19/2015 (Exact Date)   BMI 40.17 kg/m  No intake/output data recorded. No intake/output data recorded.  FHT:  FHR: 130's bpm, variability: moderate,  accelerations:  Present,  decelerations:  Absent UC:   irregular SVE:   Dilation: 4 Effacement (%): 70 Station: -3 Exam by:: ellis, cnm  Labs: Lab Results  Component Value Date   WBC 8.7 07/30/2016   HGB 12.3 07/30/2016   HCT 35.0 (L) 07/30/2016   MCV 84.1 07/30/2016   PLT 297 07/30/2016    Assessment / Plan: IOL, not yet in active labor  Labor: not yet in labor  Preeclampsia:  no signs or symptoms of toxicity, intake and ouput balanced and labs stable Fetal Wellbeing:  Category I Pain Control:  Labor support without medications I/D:  n/a Anticipated MOD:  NSVD  Koren Shiver 07/31/2016, 5:57 PM

## 2016-07-31 NOTE — Progress Notes (Signed)
Janet [name] is a 30 y.o. G1P0000 at [redacted]w[redacted]d by ultrasound admitted for preeclampsia.  Subjective:   Objective: BP (!) 117/97   Pulse 80   Temp 98.2 F (36.8 C) (Oral)   Resp 16   Ht 5\' 4"  (1.626 m)   Wt 234 lb (106.1 kg)   LMP 10/19/2015 (Exact Date)   BMI 40.17 kg/m  No intake/output data recorded. No intake/output data recorded.  FHT:  FHR: 130's bpm, variability: moderate,  accelerations:  Present,  decelerations:  Absent UC:   irregular, every 4-6 minutes SVE:   Dilation: 4 Effacement (%): 70 Station: -3 Exam by:: Lina Sar RN   Labs: Lab Results  Component Value Date   WBC 8.7 07/30/2016   HGB 12.3 07/30/2016   HCT 35.0 (L) 07/30/2016   MCV 84.1 07/30/2016   PLT 297 07/30/2016    Assessment / Plan: Induction of labor due to preeclampsia,  progressing well on pitocin  Labor: Progressing on Pitocin, will continue to increase then AROM Preeclampsia:  no signs or symptoms of toxicity, intake and ouput balanced and labs stable Fetal Wellbeing:  Category I Pain Control:  Nitrous Oxide I/D:  n/a Anticipated MOD:  NSVD  Koren Shiver 07/31/2016, 3:44 PM

## 2016-07-31 NOTE — Progress Notes (Signed)
Janet [name] is a 30 y.o. G1P0000 at [redacted]w[redacted]d by ultrasound admitted for induction of labor due to Pre-eclamptic toxemia of pregnancy..  Subjective:   Objective: BP (!) 142/90   Pulse 78   Temp 98.4 F (36.9 C) (Oral)   Resp 18   Ht 5\' 4"  (1.626 m)   Wt 234 lb (106.1 kg)   LMP 10/19/2015 (Exact Date)   SpO2 100%   BMI 40.17 kg/m  No intake/output data recorded. No intake/output data recorded.  FHT:  FHR: 140 bpm, variability: moderate,  accelerations:  Abscent,  decelerations:  Absent UC:   regular, every 2 minutes SVE:   Dilation: 5 Effacement (%): 70 Station: -3, -2 Exam by:: d. Alencia Gordon cnm  Labs: Lab Results  Component Value Date   WBC 9.8 07/31/2016   HGB 13.0 07/31/2016   HCT 37.1 07/31/2016   MCV 83.9 07/31/2016   PLT 266 07/31/2016    Assessment / Plan: Induction of labor due to preeclampsia,  progressing well on pitocin  Labor: Progressing normally Preeclampsia:  no signs or symptoms of toxicity and intake and ouput balanced Fetal Wellbeing:  Category I Pain Control:  Epidural I/D:  n/a Anticipated MOD:  NSVD  Janet [name] 07/31/2016, 10:57 PM

## 2016-08-01 ENCOUNTER — Encounter (HOSPITAL_COMMUNITY): Payer: Self-pay | Admitting: *Deleted

## 2016-08-01 ENCOUNTER — Inpatient Hospital Stay (HOSPITAL_COMMUNITY): Admission: RE | Admit: 2016-08-01 | Payer: 59 | Source: Ambulatory Visit | Admitting: Family Medicine

## 2016-08-01 MED ORDER — DIBUCAINE 1 % RE OINT: 1.0000 "application " | TOPICAL_OINTMENT | RECTAL | Status: DC | PRN

## 2016-08-01 MED ORDER — CARBOPROST TROMETHAMINE 250 MCG/ML IM SOLN
250.0000 ug | Freq: Once | INTRAMUSCULAR | Status: AC
  Administered 2016-08-01: 250 ug via INTRAMUSCULAR

## 2016-08-01 MED ORDER — COCONUT OIL OIL
1.0000 "application " | TOPICAL_OIL | Status: DC | PRN
  Administered 2016-08-02: 1 via TOPICAL
  Filled 2016-08-01: qty 120

## 2016-08-01 MED ORDER — SIMETHICONE 80 MG PO CHEW: 80.0000 mg | CHEWABLE_TABLET | ORAL | Status: DC | PRN

## 2016-08-01 MED ORDER — ONDANSETRON HCL 4 MG PO TABS
4.0000 mg | ORAL_TABLET | ORAL | Status: DC | PRN
Start: 2016-08-01 — End: 2016-08-03

## 2016-08-01 MED ORDER — TETANUS-DIPHTH-ACELL PERTUSSIS 5-2.5-18.5 LF-MCG/0.5 IM SUSP: 0.5000 mL | Freq: Once | INTRAMUSCULAR | Status: DC

## 2016-08-01 MED ORDER — DIPHENHYDRAMINE HCL 25 MG PO CAPS: 25.0000 mg | ORAL_CAPSULE | Freq: Four times a day (QID) | ORAL | Status: DC | PRN

## 2016-08-01 MED ORDER — ZOLPIDEM TARTRATE 5 MG PO TABS
5.0000 mg | ORAL_TABLET | Freq: Every evening | ORAL | Status: DC | PRN
Start: 2016-08-01 — End: 2016-08-03

## 2016-08-01 MED ORDER — PRENATAL PLUS 27-1 MG PO TABS: 1.0000 | ORAL_TABLET | Freq: Every day | ORAL | Status: DC

## 2016-08-01 MED ORDER — WITCH HAZEL-GLYCERIN EX PADS
1.0000 "application " | MEDICATED_PAD | CUTANEOUS | Status: DC | PRN
  Administered 2016-08-01: 1 via TOPICAL

## 2016-08-01 MED ORDER — MISOPROSTOL 200 MCG PO TABS
ORAL_TABLET | ORAL | Status: AC
  Filled 2016-08-01: qty 1

## 2016-08-01 MED ORDER — BENZOCAINE-MENTHOL 20-0.5 % EX AERO
1.0000 "application " | INHALATION_SPRAY | CUTANEOUS | Status: DC | PRN
  Administered 2016-08-01: 1 via TOPICAL
  Filled 2016-08-01: qty 56

## 2016-08-01 MED ORDER — DIPHENOXYLATE-ATROPINE 2.5-0.025 MG PO TABS
2.0000 | ORAL_TABLET | Freq: Once | ORAL | Status: AC
  Administered 2016-08-01 (×2): 1 via ORAL
  Filled 2016-08-01: qty 2

## 2016-08-01 MED ORDER — MISOPROSTOL 200 MCG PO TABS
800.0000 ug | ORAL_TABLET | Freq: Once | ORAL | Status: AC
  Administered 2016-08-01: 800 ug via RECTAL

## 2016-08-01 MED ORDER — PRENATAL MULTIVITAMIN CH
1.0000 | ORAL_TABLET | Freq: Every day | ORAL | Status: DC
  Administered 2016-08-01 – 2016-08-02 (×2): 1 via ORAL
  Filled 2016-08-01 (×2): qty 1

## 2016-08-01 MED ORDER — ACETAMINOPHEN 325 MG PO TABS: 650.0000 mg | ORAL_TABLET | ORAL | Status: DC | PRN

## 2016-08-01 MED ORDER — SENNOSIDES-DOCUSATE SODIUM 8.6-50 MG PO TABS
2.0000 | ORAL_TABLET | ORAL | Status: DC
  Administered 2016-08-01 – 2016-08-03 (×2): 2 via ORAL
  Filled 2016-08-01 (×2): qty 2

## 2016-08-01 MED ORDER — MISOPROSTOL 200 MCG PO TABS
ORAL_TABLET | ORAL | Status: AC
  Administered 2016-08-01: 200 ug
  Filled 2016-08-01: qty 4

## 2016-08-01 MED ORDER — CARBOPROST TROMETHAMINE 250 MCG/ML IM SOLN
INTRAMUSCULAR | Status: AC
  Filled 2016-08-01: qty 1

## 2016-08-01 MED ORDER — IBUPROFEN 600 MG PO TABS
600.0000 mg | ORAL_TABLET | Freq: Four times a day (QID) | ORAL | Status: DC
  Administered 2016-08-01 – 2016-08-03 (×7): 600 mg via ORAL
  Filled 2016-08-01 (×9): qty 1

## 2016-08-01 MED ORDER — ONDANSETRON HCL 4 MG/2ML IJ SOLN: 4.0000 mg | INTRAMUSCULAR | Status: DC | PRN

## 2016-08-01 NOTE — Lactation Note (Signed)
This note was copied from a baby's chart. Lactation Consultation Note Mom requests [place] assist with latching.  LC undressed baby while mom used hand pump to stimulate nipple.  [place] assisted with cross cradle hold.  Baby latched well with wide gape and few sucks, but did not maintain feeding with stimulation.  Baby remains STS.  Mom feels more comfortable with football hold. LC offered to assist with football hold and mom reports she will try to latch baby again later.  Mom to call for assist as needed.    Patient Name: Janet [name] [id number] Date: 08/01/2016 Reason for consult: Follow-up assessment   Maternal Data Has patient been taught Hand Expression?: Yes Does the patient have breastfeeding experience prior to this delivery?: No  Feeding Feeding Type: Breast Fed Length of feed:  (few sucks)  LATCH Score/Interventions Latch: Repeated attempts needed to sustain latch, nipple held in mouth throughout feeding, stimulation needed to elicit sucking reflex. Intervention(s): Skin to skin;Teach feeding cues;Waking techniques Intervention(s): Breast massage;Breast compression  Audible Swallowing: None Intervention(s): Skin to skin;Hand expression  Type of Nipple: Everted at rest and after stimulation (short nipples) Intervention(s): Hand pump  Comfort (Breast/Nipple): Soft / non-tender     Hold (Positioning): Assistance needed to correctly position infant at breast and maintain latch. Intervention(s): Breastfeeding basics reviewed;Support Pillows;Position options;Skin to skin  LATCH Score: 6  Lactation Tools Discussed/Used     Consult Status Consult Status: Follow-up Date: 08/02/16 Follow-up type: In-patient    Justice Britain 08/01/2016, 10:04 PM

## 2016-08-01 NOTE — Lactation Note (Signed)
This note was copied from a baby's chart. Lactation Consultation Note Initial visit at 8 hours of age. Mom reports several feedings but unsure if she is doing it right.  Mom is ready to take a shower so LC gave mom [facility] LC resources.  Encouraged to feed with early cues on demand.  Early newborn behavior discussed.  Hand expression demonstrated with colostrum visible. Mom needs additional assist with hand expression and latching. Mom to call for assist as needed.     Patient Name: Janet [name] [id number] Date: 08/01/2016 Reason for consult: Initial assessment   Maternal Data Has patient been taught Hand Expression?: Yes Does the patient have breastfeeding experience prior to this delivery?: No  Feeding Feeding Type: Breast Fed Length of feed: 35 min  LATCH Score/Interventions Latch: Repeated attempts needed to sustain latch, nipple held in mouth throughout feeding, stimulation needed to elicit sucking reflex. Intervention(s): Skin to skin Intervention(s): Adjust position;Assist with latch;Breast compression  Audible Swallowing: A few with stimulation Intervention(s): Skin to skin;Hand expression  Type of Nipple: Everted at rest and after stimulation  Comfort (Breast/Nipple): Soft / non-tender     Hold (Positioning): Assistance needed to correctly position infant at breast and maintain latch. Intervention(s): Support Pillows;Skin to skin  LATCH Score: 7  Lactation Tools Discussed/Used     Consult Status Consult Status: Follow-up Date: 08/02/16 Follow-up type: In-patient    Delson Dulworth, [name] 08/01/2016, 10:01 PM

## 2016-08-01 NOTE — Plan of Care (Signed)
Problem: Education: Goal: Knowledge of condition will improve Admission paperwork reviewed with patient and significant other on hospital policies, procedures and safety. Patient and significant other verbalize understanding.  Problem: Nutritional: Goal: Mother's verbalization of comfort with breastfeeding process will improve Outcome: Completed/Met Date Met: 08/01/16 Encouraged patient to call for breast feeding assistance and latch scoring.    

## 2016-08-01 NOTE — Anesthesia Postprocedure Evaluation (Signed)
Anesthesia Post Note  Patient: Janet [name]  Procedure(s) Performed: * No procedures listed *  Patient location during evaluation: Mother Baby Anesthesia Type: Epidural Level of consciousness: awake and alert, oriented and patient cooperative Pain management: pain level controlled Vital Signs Assessment: post-procedure vital signs reviewed and stable Respiratory status: nonlabored ventilation, spontaneous breathing and respiratory function stable Cardiovascular status: stable Postop Assessment: no headache, no backache, patient able to bend at knees, no signs of nausea or vomiting and adequate PO intake Anesthetic complications: no     Last Vitals:  Vitals:   08/01/16 1520 08/01/16 1856  BP: 126/79 129/75  Pulse: 85 75  Resp: 16 18  Temp: 37.3 C 36.8 C    Last Pain:  Vitals:   08/01/16 1857  TempSrc:   PainSc: 0-No pain   Pain Goal: Patients Stated Pain Goal: 2 (08/01/16 1630)               Natoshia Souter

## 2016-08-02 LAB — CBC
HEMATOCRIT: 27.6 % — AB (ref 36.0–46.0)
Hemoglobin: 9.7 g/dL — ABNORMAL LOW (ref 12.0–15.0)
MCH: 29.3 pg (ref 26.0–34.0)
MCHC: 35.1 g/dL (ref 30.0–36.0)
MCV: 83.4 fL (ref 78.0–100.0)
PLATELETS: 227 10*3/uL (ref 150–400)
RBC: 3.31 MIL/uL — ABNORMAL LOW (ref 3.87–5.11)
RDW: 14.6 % (ref 11.5–15.5)
WBC: 15.6 10*3/uL — ABNORMAL HIGH (ref 4.0–10.5)

## 2016-08-02 MED ORDER — FERROUS SULFATE 325 (65 FE) MG PO TABS
325.0000 mg | ORAL_TABLET | Freq: Two times a day (BID) | ORAL | Status: DC
  Administered 2016-08-03: 325 mg via ORAL
  Filled 2016-08-02: qty 1

## 2016-08-02 NOTE — Lactation Note (Signed)
This note was copied from a baby's chart. Lactation Consultation Note  Patient Name: Janet [name] [id number] Date: 08/02/2016 Reason for consult: Follow-up assessment Baby at 35 hr of life. Mom was requesting latch help. Mom's L nipple has a bright red ring around the nipple base a bright red circle in the middle of the nipple surface. The R nipple has a pink circle in the center of the nipple surface. Mom reports the L hurts and R is "ok". Given comfort gels and reviewed nipple care. Demonstrated to parents how to use pillows to support baby at breast level and "tea cup" the nipple to get a deeper latch. Moved mom up the the 30 mm flanges because she said the 27 mm were pinching. Mom is aware of lactation services and support group. She will call as needed.    Maternal Data    Feeding Feeding Type: Breast Fed Length of feed: 20 min  LATCH Score/Interventions Latch: Grasps breast easily, tongue down, lips flanged, rhythmical sucking.  Audible Swallowing: Spontaneous and intermittent  Type of Nipple: Everted at rest and after stimulation  Comfort (Breast/Nipple): Filling, red/small blisters or bruises, mild/mod discomfort  Problem noted: Mild/Moderate discomfort;Cracked, bleeding, blisters, bruises Interventions  (Cracked/bleeding/bruising/blister): Expressed breast milk to nipple Interventions (Mild/moderate discomfort): Comfort gels  Hold (Positioning): Assistance needed to correctly position infant at breast and maintain latch. Intervention(s): Support Pillows;Position options  LATCH Score: 8  Lactation Tools Discussed/Used     Consult Status Consult Status: Follow-up Date: 08/03/16 Follow-up type: In-patient    Denzil Hughes 08/02/2016, 11:00 PM

## 2016-08-02 NOTE — Progress Notes (Signed)
Patient ID: [name], female   DOB: [birth year], 30 y.o.   MRN: [id number]  POSTPARTUM PROGRESS NOTE  Post Partum Day #1 Subjective:  ALLEYNA BOLTHOUSE is a 30 y.o. G1P1001 [redacted]w[redacted]d s/p SVD with 15 sec shoulder dystocia.  No acute events overnight.  Pt denies problems with ambulating, voiding or po intake.  She denies nausea or vomiting.  Pain is well controlled.  She has had flatus. She has not had bowel movement.  Lochia Moderate, denies clots. Denies dizziness/lightheadedness, palpitations. Denies HAs, changes in vision, RUQ/epigastric pain.   Objective: Blood pressure 111/63, pulse 71, temperature 98.3 F (36.8 C), temperature source Oral, resp. rate 18, height 5\' 4"  (1.626 m), weight 234 lb (106.1 kg), last menstrual period 10/19/2015, SpO2 98 %, unknown if currently breastfeeding.  Physical Exam:  General: alert, cooperative and no distress Lochia:normal flow Chest: CTAB Heart: RRR no m/r/g Abdomen: +BS, soft, nontender Uterine Fundus: firm, below DVT Evaluation: No calf swelling or tenderness Extremities: Trace edema   Recent Labs  07/31/16 2211 08/02/16 0528  HGB 13.0 9.7*  HCT 37.1 27.6*    Assessment/Plan:  ASSESSMENT: VIRGEN KROCKER is a 30 y.o. G1P1001 [redacted]w[redacted]d s/p SVD with shoulder dystocia. Preeclampsia during pregnancy: BPs normal, will continue to follow, no signs/symtpoms of preE. PPH 500cc, with blood loss anemia: no symptoms of anemia, repeat Hb 9.7 this AM. Ferrous sulfate to be given. If bleeding worsens, will obtain CBC.   Plan for discharge tomorrow, Breastfeeding, Lactation consult and Contraception POPs   LOS: 3 days   Katherine Basset, DO 08/02/2016, 4:40 PM

## 2016-08-02 NOTE — Progress Notes (Signed)
Post Partum Day 1 Subjective: up ad lib, voiding, tolerating PO and + flatus  Pt requesting additional help with breastfeeding from nursing staff and Lactation Consultants Considering discharge later today after additional breastfeeding help Objective: Blood pressure 111/63, pulse 71, temperature 98.3 F (36.8 C), temperature source Oral, resp. rate 18, height 5\' 4"  (1.626 m), weight 106.1 kg (234 lb), last menstrual period 10/19/2015, SpO2 98 %, unknown if currently breastfeeding.  Physical Exam:  General: alert, cooperative, appears stated age, fatigued and no distress Lochia: Appropriate, rubra, small, no clots Uterine Fundus: Firm, midline, at U Perineum is edemateous, ice pack in place DVT Evaluation: Negative for signs of DVT   Recent Labs  07/31/16 2211 08/02/16 0528  HGB 13.0 9.7*  HCT 37.1 27.6*    Assessment/Plan: Lactation consult [place] for discharge home later tonight or tomorrow   LOS: 3 days   Mallie Snooks 08/02/2016, 7:36 AM    [facility] [place]  I have seen and examined this patient. Note this is a Careers information officer note and as such does not necessarily reflect the patient's plan of care. Please see progress note for this date of service.    Katherine Basset, DO [place] Fellow 08/02/2016

## 2016-08-02 NOTE — Plan of Care (Signed)
Problem: Education: Goal: Knowledge of condition will improve Provided patient with coconut oil for sore nipples. Encouraged hand expression of breast milk to nipples and to call for latch score. Encouraged deep latch and educated on coconut oil use. Patient verbalizes understanding.

## 2016-08-02 NOTE — Lactation Note (Signed)
This note was copied from a baby's chart. Lactation Consultation Note Follow up visit at 25 hours of age.  Baby has had 8 feedings with some LATCH scores of "8" by RN.  Mom is complaining of nipple pain with cracking at base of left nipple and compression stripe to right nipple with redness at nipple.  Mom has coconut oil  Last feeding was >5 hours ago.  LC assisted with waking techniques and baby began showing feeding cues with fist to mouth and rooting.  Lc assisted with latching in various positions, baby became sleepy with non-nutritive sucking and no audible swallows.  Mom denies pain with latch on right breast, but baby is not eager.  When baby removed from breast nipple noted to be compressed and baby became eager to eat.   Lc allowed baby to suck on gloved finger.  Bowl shaped tongue noted with limited extension of tongue past lower gumline during sucking.  LC noted some extention of tongue, but not while sucking. LC encouraged mom to try suck training and she noted baby to chomp/bite at finger.     [name] assisted with NS application of [id number] and [id number] with #20 not staying in place well until feeding began with #16 and then increased to #20.  MOm is able to return demonstration of application.   Mom reports increased pain with NS.  Baby only slightly more rhythmic with sucking using a NS and tires quickly. Few drops of expressed colostrum applied to baby's mouth with gloved finger.  LC does not feel baby is transfering well at this feeding and reported to [name], baby might not be a candidate for early discharge at only 81 hours of age, until feeding are well established.  Baby may have become tired with feedings due to little transfer with previous feedings.   Mom to call for assist with next feeding.    DEBP set up with cleaning and storage guidelines discussed.  Mom will need to try NS again to know if latch pain improves with NS use.  Mom encouraged to post pump with feedings to establish a  good supply.   Follow up appointment with o/p scheduled for 08/08/16 at 1:30 and baby will see provider in 2 days unless discharge is cancelled.    Patient Name: Janet [name] [id number] Date: 08/02/2016 Reason for consult: Follow-up assessment;Breast/nipple pain;Difficult latch   Maternal Data Has patient been taught Hand Expression?: Yes  Feeding Feeding Type: Breast Fed Length of feed:  (few minutes on and off of sucking over 30 minutes)  LATCH Score/Interventions Latch: Repeated attempts needed to sustain latch, nipple held in mouth throughout feeding, stimulation needed to elicit sucking reflex. Intervention(s): Skin to skin;Teach feeding cues;Waking techniques Intervention(s): Assist with latch;Breast massage;Breast compression;Adjust position  Audible Swallowing: A few with stimulation (3 swallows)  Type of Nipple: Flat (short when everted) Intervention(s): Hand pump  Comfort (Breast/Nipple): Filling, red/small blisters or bruises, mild/mod discomfort  Problem noted: Mild/Moderate discomfort Interventions (Mild/moderate discomfort): Post-pump;Hand expression  Hold (Positioning): Assistance needed to correctly position infant at breast and maintain latch. Intervention(s): Breastfeeding basics reviewed;Support Pillows;Position options;Skin to skin  LATCH Score: 5  Lactation Tools Discussed/Used Tools: Nipple Shields Nipple shield size: 20 Pump Review: Setup, frequency, and cleaning Initiated by:: Janet [name]  Date initiated:: 08/02/16   Consult Status Consult Status: Follow-up Date: 08/08/16 (in patient if mom is not discharged) Follow-up type: Out-patient    Janet [name], Janet [name] 08/02/2016, 1:29 PM

## 2016-08-03 ENCOUNTER — Encounter: Payer: Self-pay | Admitting: Women's Health

## 2016-08-03 MED ORDER — IBUPROFEN 600 MG PO TABS: 600.0000 mg | ORAL_TABLET | Freq: Four times a day (QID) | ORAL | 0 refills | Status: DC | PRN

## 2016-08-03 NOTE — Discharge Instructions (Signed)
Call the office [phone]) or go to [facility] hospital for these signs of pre-eclampsia:  Severe headache that does not go away with Tylenol  Visual changes- seeing spots, double, blurred vision  Pain under your right breast or upper abdomen that does not go away with Tums or heartburn medicine  Nausea and/or vomiting  Severe swelling in your hands, feet, and face       Postpartum Care After Vaginal Delivery After you deliver your newborn (postpartum period), the usual stay in the hospital is 24-72 hours. If there were problems with your labor or delivery, or if you have other medical problems, you might be in the hospital longer.  While you are in the hospital, you will receive help and instructions on how to care for yourself and your newborn during the postpartum period.  While you are in the hospital:  Be sure to tell your nurses if you have pain or discomfort, as well as where you feel the pain and what makes the pain worse.  If you had an incision made near your vagina (episiotomy) or if you had some tearing during delivery, the nurses may put ice packs on your episiotomy or tear. The ice packs may help to reduce the pain and swelling.  If you are breastfeeding, you may feel uncomfortable contractions of your uterus for a couple of weeks. This is normal. The contractions help your uterus get back to normal size.  It is normal to have some bleeding after delivery.  For the first 1-3 days after delivery, the flow is red and the amount may be similar to a period.  It is common for the flow to start and stop.  In the first few days, you may pass some small clots. Let your nurses know if you begin to pass large clots or your flow increases.  Do not  flush blood clots down the toilet before having the nurse look at them.  During the next 3-10 days after delivery, your flow should become more watery and pink or brown-tinged in color.  Ten to fourteen days after delivery, your flow  should be a small amount of yellowish-white discharge.  The amount of your flow will decrease over the first few weeks after delivery. Your flow may stop in 6-8 weeks. Most women have had their flow stop by 12 weeks after delivery.  You should change your sanitary pads frequently.  Wash your hands thoroughly with soap and water for at least 20 seconds after changing pads, using the toilet, or before holding or feeding your newborn.  You should feel like you need to empty your bladder within the first 6-8 hours after delivery.  In case you become weak, lightheaded, or faint, call your nurse before you get out of bed for the first time and before you take a shower for the first time.  Within the first few days after delivery, your breasts may begin to feel tender and full. This is called engorgement. Breast tenderness usually goes away within 48-72 hours after engorgement occurs. You may also notice milk leaking from your breasts. If you are not breastfeeding, do not stimulate your breasts. Breast stimulation can make your breasts produce more milk.  Spending as much time as possible with your newborn is very important. During this time, you and your newborn can feel close and get to know each other. Having your newborn stay in your room (rooming in) will help to strengthen the bond with your newborn. It will give you  time to get to know your newborn and become comfortable caring for your newborn.  Your hormones change after delivery. Sometimes the hormone changes can temporarily cause you to feel sad or tearful. These feelings should not last more than a few days. If these feelings last longer than that, you should talk to your caregiver.  If desired, talk to your caregiver about methods of family planning or contraception.  Talk to your caregiver about immunizations. Your caregiver may want you to have the following immunizations before leaving the hospital:  Tetanus, diphtheria, and pertussis  (Tdap) or tetanus and diphtheria (Td) immunization. It is very important that you and your family (including grandparents) or others caring for your newborn are up-to-date with the Tdap or Td immunizations. The Tdap or Td immunization can help protect your newborn from getting ill.  Rubella immunization.  Varicella (chickenpox) immunization.  Influenza immunization. You should receive this annual immunization if you did not receive the immunization during your pregnancy.   This information is not intended to replace advice given to you by your health care provider. Make sure you discuss any questions you have with your health care provider.   Document Released: 07/24/2007 Document Revised: 06/20/2012 Document Reviewed: 05/23/2012 Elsevier Interactive Patient Education [employer].   Breastfeeding Deciding to breastfeed is one of the best choices you can make for you and your baby. A change in hormones during pregnancy causes your breast tissue to grow and increases the number and size of your milk ducts. These hormones also allow proteins, sugars, and fats from your blood supply to make breast milk in your milk-producing glands. Hormones prevent breast milk from being released before your baby is born as well as prompt milk flow after birth. Once breastfeeding has begun, thoughts of your baby, as well as his or her sucking or crying, can stimulate the release of milk from your milk-producing glands.  BENEFITS OF BREASTFEEDING For Your Baby  Your first milk (colostrum) helps your baby's digestive system function better.  There are antibodies in your milk that help your baby fight off infections.  Your baby has a lower incidence of asthma, allergies, and sudden infant death syndrome.  The nutrients in breast milk are better for your baby than infant formulas and are designed uniquely for your baby's needs.  Breast milk improves your baby's brain development.  Your baby is less  likely to develop other conditions, such as childhood obesity, asthma, or type 2 diabetes mellitus. For You  Breastfeeding helps to create a very special bond between you and your baby.  Breastfeeding is convenient. Breast milk is always available at the correct temperature and costs nothing.  Breastfeeding helps to burn calories and helps you lose the weight gained during pregnancy.  Breastfeeding makes your uterus contract to its prepregnancy size faster and slows bleeding (lochia) after you give birth.   Breastfeeding helps to lower your risk of developing type 2 diabetes mellitus, osteoporosis, and breast or ovarian cancer later in life. SIGNS THAT YOUR BABY IS HUNGRY Early Signs of Hunger  Increased alertness or activity.  Stretching.  Movement of the head from side to side.  Movement of the head and opening of the mouth when the corner of the mouth or cheek is stroked (rooting).  Increased sucking sounds, smacking lips, cooing, sighing, or squeaking.  Hand-to-mouth movements.  Increased sucking of fingers or hands. Late Signs of Hunger  Fussing.  Intermittent crying. Extreme Signs of Hunger Signs of extreme hunger will require  calming and consoling before your baby will be able to breastfeed successfully. Do not wait for the following signs of extreme hunger to occur before you initiate breastfeeding:  Restlessness.  A loud, strong cry.  Screaming. BREASTFEEDING BASICS Breastfeeding Initiation  Find a comfortable place to sit or lie down, with your neck and back well supported.  Place a pillow or rolled up blanket under your baby to bring him or her to the level of your breast (if you are seated). Nursing pillows are specially designed to help support your arms and your baby while you breastfeed.  Make sure that your baby's abdomen is facing your abdomen.  Gently massage your breast. With your fingertips, massage from your chest wall toward your nipple in a  circular motion. This encourages milk flow. You may need to continue this action during the feeding if your milk flows slowly.  Support your breast with 4 fingers underneath and your thumb above your nipple. Make sure your fingers are well away from your nipple and your baby's mouth.  Stroke your baby's lips gently with your finger or nipple.  When your baby's mouth is open wide enough, quickly bring your baby to your breast, placing your entire nipple and as much of the colored area around your nipple (areola) as possible into your baby's mouth.  More areola should be visible above your baby's upper lip than below the lower lip.  Your baby's tongue should be between his or her lower gum and your breast.  Ensure that your baby's mouth is correctly positioned around your nipple (latched). Your baby's lips should create a seal on your breast and be turned out (everted).  It is common for your baby to suck about 2-3 minutes in order to start the flow of breast milk. Latching Teaching your baby how to latch on to your breast properly is very important. An improper latch can cause nipple pain and decreased milk supply for you and poor weight gain in your baby. Also, if your baby is not latched onto your nipple properly, he or she may swallow some air during feeding. This can make your baby fussy. Burping your baby when you switch breasts during the feeding can help to get rid of the air. However, teaching your baby to latch on properly is still the best way to prevent fussiness from swallowing air while breastfeeding. Signs that your baby has successfully latched on to your nipple:  Silent tugging or silent sucking, without causing you pain.  Swallowing heard between every 3-4 sucks.  Muscle movement above and in front of his or her ears while sucking. Signs that your baby has not successfully latched on to nipple:  Sucking sounds or smacking sounds from your baby while breastfeeding.  Nipple  pain. If you think your baby has not latched on correctly, slip your finger into the corner of your baby's mouth to break the suction and place it between your baby's gums. Attempt breastfeeding initiation again. Signs of Successful Breastfeeding Signs from your baby:  A gradual decrease in the number of sucks or complete cessation of sucking.  Falling asleep.  Relaxation of his or her body.  Retention of a small amount of milk in his or her mouth.  Letting go of your breast by himself or herself. Signs from you:  Breasts that have increased in firmness, weight, and size 1-3 hours after feeding.  Breasts that are softer immediately after breastfeeding.  Increased milk volume, as well as a change in  milk consistency and color by the fifth day of breastfeeding.  Nipples that are not sore, cracked, or bleeding. Signs That Your Randel Books is Getting Enough Milk  Wetting at least 3 diapers in a 24-hour period. The urine should be clear and pale yellow by age 83 days.  At least 3 stools in a 24-hour period by age 83 days. The stool should be soft and yellow.  At least 3 stools in a 24-hour period by age 70 days. The stool should be seedy and yellow.  No loss of weight greater than 10% of birth weight during the first 22 days of age.  Average weight gain of 4-7 ounces (113-198 g) per week after age 27 days.  Consistent daily weight gain by age 35 days, without weight loss after the age of 2 weeks. After a feeding, your baby may spit up a small amount. This is common. BREASTFEEDING FREQUENCY AND DURATION Frequent feeding will help you make more milk and can prevent sore nipples and breast engorgement. Breastfeed when you feel the need to reduce the fullness of your breasts or when your baby shows signs of hunger. This is called "breastfeeding on demand." Avoid introducing a pacifier to your baby while you are working to establish breastfeeding (the first 4-6 weeks after your baby is born). After  this time you may choose to use a pacifier. Research has shown that pacifier use during the first year of a baby's life decreases the risk of sudden infant death syndrome (SIDS). Allow your baby to feed on each breast as long as he or she wants. Breastfeed until your baby is finished feeding. When your baby unlatches or falls asleep while feeding from the first breast, offer the second breast. Because newborns are often sleepy in the first few weeks of life, you may need to awaken your baby to get him or her to feed. Breastfeeding times will vary from baby to baby. However, the following rules can serve as a guide to help you ensure that your baby is properly fed:  Newborns (babies 58 weeks of age or younger) may breastfeed every 1-3 hours.  Newborns should not go longer than 3 hours during the day or 5 hours during the night without breastfeeding.  You should breastfeed your baby a minimum of 8 times in a 24-hour period until you begin to introduce solid foods to your baby at around 17 months of age. BREAST MILK PUMPING Pumping and storing breast milk allows you to ensure that your baby is exclusively fed your breast milk, even at times when you are unable to breastfeed. This is especially important if you are going back to work while you are still breastfeeding or when you are not able to be present during feedings. Your lactation consultant can give you guidelines on how long it is safe to store breast milk. A breast pump is a machine that allows you to pump milk from your breast into a sterile bottle. The pumped breast milk can then be stored in a refrigerator or freezer. Some breast pumps are operated by hand, while others use electricity. Ask your lactation consultant which type will work best for you. Breast pumps can be purchased, but some hospitals and breastfeeding support groups lease breast pumps on a monthly basis. A lactation consultant can teach you how to hand express breast milk, if you  prefer not to use a pump. CARING FOR YOUR BREASTS WHILE YOU BREASTFEED Nipples can become dry, cracked, and sore while breastfeeding. The  following recommendations can help keep your breasts moisturized and healthy:  Avoid using soap on your nipples.  Wear a supportive bra. Although not required, special nursing bras and tank tops are designed to allow access to your breasts for breastfeeding without taking off your entire bra or top. Avoid wearing underwire-style bras or extremely tight bras.  Air dry your nipples for 3-14minutes after each feeding.  Use only cotton bra pads to absorb leaked breast milk. Leaking of breast milk between feedings is normal.  Use lanolin on your nipples after breastfeeding. Lanolin helps to maintain your skin's normal moisture barrier. If you use pure lanolin, you do not need to wash it off before feeding your baby again. Pure lanolin is not toxic to your baby. You may also hand express a few drops of breast milk and gently massage that milk into your nipples and allow the milk to air dry. In the first few weeks after giving birth, some women experience extremely full breasts (engorgement). Engorgement can make your breasts feel heavy, warm, and tender to the touch. Engorgement peaks within 3-5 days after you give birth. The following recommendations can help ease engorgement:  Completely empty your breasts while breastfeeding or pumping. You may want to start by applying warm, moist heat (in the shower or with warm water-soaked hand towels) just before feeding or pumping. This increases circulation and helps the milk flow. If your baby does not completely empty your breasts while breastfeeding, pump any extra milk after he or she is finished.  Wear a snug bra (nursing or regular) or tank top for 1-2 days to signal your body to slightly decrease milk production.  Apply ice packs to your breasts, unless this is too uncomfortable for you.  Make sure that your baby is  latched on and positioned properly while breastfeeding. If engorgement persists after 48 hours of following these recommendations, contact your health care provider or a Science writer. OVERALL HEALTH CARE RECOMMENDATIONS WHILE BREASTFEEDING  Eat healthy foods. Alternate between meals and snacks, eating 3 of each per day. Because what you eat affects your breast milk, some of the foods may make your baby more irritable than usual. Avoid eating these foods if you are sure that they are negatively affecting your baby.  Drink milk, fruit juice, and water to satisfy your thirst (about 10 glasses a day).  Rest often, relax, and continue to take your prenatal vitamins to prevent fatigue, stress, and anemia.  Continue breast self-awareness checks.  Avoid chewing and smoking tobacco. Chemicals from cigarettes that pass into breast milk and exposure to secondhand smoke may harm your baby.  Avoid alcohol and drug use, including marijuana. Some medicines that may be harmful to your baby can pass through breast milk. It is important to ask your health care provider before taking any medicine, including all over-the-counter and prescription medicine as well as vitamin and herbal supplements. It is possible to become pregnant while breastfeeding. If birth control is desired, ask your health care provider about options that will be safe for your baby. SEEK MEDICAL CARE IF:  You feel like you want to stop breastfeeding or have become frustrated with breastfeeding.  You have painful breasts or nipples.  Your nipples are cracked or bleeding.  Your breasts are red, tender, or warm.  You have a swollen area on either breast.  You have a fever or chills.  You have nausea or vomiting.  You have drainage other than breast milk from your nipples.  Your  breasts do not become full before feedings by the fifth day after you give birth.  You feel sad and depressed.  Your baby is too sleepy to eat  well.  Your baby is having trouble sleeping.   Your baby is wetting less than 3 diapers in a 24-hour period.  Your baby has less than 3 stools in a 24-hour period.  Your baby's skin or the white part of his or her eyes becomes yellow.   Your baby is not gaining weight by 50 days of age. SEEK IMMEDIATE MEDICAL CARE IF:  Your baby is overly tired (lethargic) and does not want to wake up and feed.  Your baby develops an unexplained fever.   This information is not intended to replace advice given to you by your health care provider. Make sure you discuss any questions you have with your health care provider.   Document Released: 09/26/2005 Document Revised: 06/17/2015 Document Reviewed: 03/20/2013 Elsevier Interactive Patient Education [employer].

## 2016-08-03 NOTE — Lactation Note (Signed)
This note was copied from a baby's chart. Lactation Consultation Note Mother has bilateral cracking. Mother tolerates latching well. Advised mother to phone ob and get rx for [facility]. Observed mother latching infant in football hold. Infant latched on with a shallow latch. Mother has semi flat nipples but she is able to get the infant on the breast and flange her lips for wider gape.  Reviewed engorgement and Mastitis with mother . Advised mother to continue to feed infant 8-12 times in 24 hours. Discussed cue base feeding and allow for cluster feeding. Mother is aware of available [place] services.  Patient Name: Janet [name] [id number] Date: 08/03/2016 Reason for consult: Follow-up assessment   Maternal Data    Feeding Feeding Type: Breast Fed  LATCH Score/Interventions Latch: Grasps breast easily, tongue down, lips flanged, rhythmical sucking.  Audible Swallowing: A few with stimulation  Type of Nipple: Everted at rest and after stimulation  Comfort (Breast/Nipple): Filling, red/small blisters or bruises, mild/mod discomfort     Hold (Positioning): Assistance needed to correctly position infant at breast and maintain latch. Intervention(s): Breastfeeding basics reviewed;Support Pillows;Position options  LATCH Score: 7  Lactation Tools Discussed/Used     Consult Status Consult Status: Complete    Darla Lesches 08/03/2016, 9:47 AM

## 2016-08-03 NOTE — Discharge Summary (Signed)
OB Discharge Summary     Patient Name: Janet [name] DOB: [birth year] MRN: [id number]  Date of admission: 07/30/2016 Delivering MD: Anthoney Harada D   Date of discharge: 08/03/2016  Admitting diagnosis: 40.5 WKS, WATER BROKE, LEFT PELIVC HIP PAIN Intrauterine pregnancy: [redacted]w[redacted]d     Secondary diagnosis:  Active Problems:   Pre-eclampsia  Additional problems: chiari malformation     Discharge diagnosis: Term Pregnancy Delivered, PPH and 15sec shoulder dystocia                                                                                                Post partum procedures:none  Augmentation: AROM, Pitocin, Cytotec and Foley Balloon  Complications: 15 sec shoulder dystocia, PPH- given cytotec and hemabate  Hospital course:  Induction of Labor With Vaginal Delivery   30 y.o. yo G1P1001 at [redacted]w[redacted]d was admitted to the hospital 07/30/2016 for induction of labor.  Indication for induction: Preeclampsia.  Patient had an uncomplicated labor course as follows: Membrane Rupture Time/Date: 7:57 PM ,07/31/2016   Intrapartum Procedures: Episiotomy: None [1]                                         Lacerations:  Vaginal [6]  Patient had delivery of a Viable infant.  Information for the patient's newborn:  Isola, Keltz [id number]  Delivery Method: Vaginal, Spontaneous Delivery (Filed from Delivery Summary)   08/01/2016  Details of delivery can be found in separate delivery note.  Patient had a routine postpartum course. Patient is discharged home 08/03/16. Eating, drinking, voiding, ambulating well.  +flatus and bm.  Lochia and pain wnl.  Denies dizziness, lightheadedness, or sob. No complaints. Denies ha, visual changes, ruq/epigastric pain, n/v.     Physical exam Vitals:   08/01/16 2056 08/02/16 0607 08/02/16 1732 08/03/16 0610  BP: 135/82 111/63 133/70 124/86  Pulse: 80 71 62 77  Resp: 18 18 18 18   Temp: 98.2 F (36.8 C) 98.3 F (36.8 C)  97.8 F (36.6 C)  TempSrc: Oral  Oral  Oral  SpO2:  98%    Weight:      Height:       General: alert, cooperative and no distress Lochia: appropriate Uterine Fundus: firm Incision: N/A DVT Evaluation: No evidence of DVT seen on physical exam. Negative Homan's sign. No cords or calf tenderness. No significant calf/ankle edema. Labs: Lab Results  Component Value Date   WBC 15.6 (H) 08/02/2016   HGB 9.7 (L) 08/02/2016   HCT 27.6 (L) 08/02/2016   MCV 83.4 08/02/2016   PLT 227 08/02/2016   CMP Latest Ref Rng & Units 07/30/2016  Glucose 65 - 99 mg/dL 94  BUN 6 - 20 mg/dL 7  Creatinine 0.44 - 1.00 mg/dL 0.54  Sodium 135 - 145 mmol/L 136  Potassium 3.5 - 5.1 mmol/L 3.9  Chloride 101 - 111 mmol/L 106  CO2 22 - 32 mmol/L 22  Calcium 8.9 - 10.3 mg/dL 9.2  Total Protein 6.5 - 8.1 g/dL 6.2(L)  Total Bilirubin 0.3 - 1.2 mg/dL 0.3  Alkaline Phos 38 - 126 U/L 158(H)  AST 15 - 41 U/L 24  ALT 14 - 54 U/L 15    Discharge instruction: per After Visit Summary and "Baby and Me Booklet".  After visit meds:    Medication List    TAKE these medications   ferrous sulfate 325 (65 FE) MG tablet Take 1 tablet (325 mg total) by mouth 2 (two) times daily with a meal.   ibuprofen 600 MG tablet Commonly known as:  ADVIL,MOTRIN Take 1 tablet (600 mg total) by mouth every 6 (six) hours as needed for mild pain, moderate pain or cramping.   pantoprazole 40 MG tablet Commonly known as:  PROTONIX Take 40 mg by mouth daily as needed (for acid reflux).   prenatal vitamin w/FE, FA 27-1 MG Tabs tablet Take 1 tablet by mouth daily.       Diet: routine diet  Activity: Advance as tolerated. Pelvic rest for 6 weeks.   Outpatient follow up:11/28 as scheduled for pp visit Follow up Appt:Future Appointments Date Time Provider [place]  08/08/2016 1:30 PM [place] Hig[place] None  09/06/2016 3:30 PM Roma Schanz, CNM [facility]   Follow up Visit:No Follow-up on file.  Postpartum contraception:  abstinence until pp visit/POPs  Reviewed pp pre-e s/s, reasons to seek care, all pp bp's normal  Newborn Data: Live born female  Birth Weight: 7 lb 8.6 oz (3420 g) APGAR: 8, 9  Baby Feeding: Breast Disposition:home with mother   08/03/2016 Tawnya Crook, CNM

## 2016-08-05 ENCOUNTER — Telehealth (HOSPITAL_COMMUNITY): Payer: Self-pay | Admitting: Lactation Services

## 2016-08-05 NOTE — Telephone Encounter (Signed)
Mom called, left message and I called her back. Reports her milk has come in but now right breast is engorged. Has pumped this after noon and obtained 2 oz. Reports nipple is flatter now and baby is having a hard time getting latched to breast. Suggested pumping a few minutes prior to nursing to soften breast and help erect nipple. Reviewed engorgement prevention and treatment. Encouraged to pump for comfort. To continue trying to latch baby to right breast. Had appt with Ped today and she reports baby is gaining weight. Has appt with [place] on Monday. No further questions at present. To call prn

## 2016-08-08 ENCOUNTER — Ambulatory Visit (HOSPITAL_COMMUNITY)
Admit: 2016-08-08 | Discharge: 2016-08-08 | Disposition: A | Discharge status: Home / Self Care | Payer: 59 | Attending: Obstetrics & Gynecology | Admitting: Obstetrics & Gynecology

## 2016-08-08 NOTE — Lactation Note (Signed)
Lactation Consult - mom - Janet, [name], dad , and baby girl Janet [name] present for 1:30 [place] O/P appt.  Per mom last fed at home at 10:00 for 20 mins, last weight last Friday at [facility] - 7-6 oz. Baby awake and alert , color pink .  1st time breast feeding. Baby has been feeding around the clock about every 2 hours and on demand  - 15 - 30 mins  11 wets , 11 yellow stools ( seedy ) . Per mom breast feeding has been going well and baby is gaining. Pump down when over full or baby only feeds one breast. Sore nipple left nipple.  Mom pumping with DEBP Medela once a day after feeding with 2-3 oz yield off the breast baby didn't feed.  Left sore nipple with scab - mom's doctor order - All Purpose Nipple Cream. Per mom swelling in feet and ankles have improved and no further problems with high B/P since delivery.  Mother's reason for visit: per mom assistance w/ feedings  Visit Type:  Feeding assessment  Appointment Notes:  Feeding assessment - pt confirmed appt. For 10/31  Consult:  Initial Lactation Consultant:  Jerlyn Ly Rondell Pardon  ________________________________________________________________________ Janet [name] Name:  Janet [name] Date of Birth:  08/01/2016 Pediatrician: Dr. Albertina Parr - [facility] [facility]  Gender:  female Gestational Age: [redacted]w[redacted]d (At Birth) Birth Weight:  7 lb 8.6 oz (3420 g) Weight at Discharge:  Weight: 7 lb 3 oz (3260 g)                  Date of Discharge:  08/03/2016      [facility] Weights   08/01/16 1154 08/01/16 2330 08/02/16 2310  Weight: 7 lb 8.6 oz (3420 g) 7 lb 7 oz (3374 g) 7 lb 3 oz (3260 g)  Last weight taken from location outside of [facility]: 7-6 oz - last Friday 10/27    Location:Pediatrician's office Weight today: 3496 g , 7-11.3 oz     ________________________________________________________________________  [name] Type of delivery:  Vaginal Delivery  Breastfeeding Experience:  1st baby  Maternal Medical Conditions:   Pregnancy induced hypertension , carpal Tunnel pregnancy , and still present, anemia   Mom had a PPH after delivery - anemic Maternal Medications:  PNV, Iron , Motrin   ________________________________________________________________________  Breastfeeding History (Post Discharge) - see above note   ________________________________________________________________________  Maternal Breast Assessment  Breast:  Full Nipple:  Erect Pain level:  0 - right breast , left - 2-3 to start , resolved to 0 quickly  Pain interventions:  Expressed breast milk  _______________________________________________________________________ Feeding Assessment/Evaluation  Initial feeding assessment:  Infant's oral assessment:  Upper lip stretches well with exam and when latched. Baby have good mobility of tongue and extends over gum line and noted to raise above the corners of the her mouth.   Positioning:  Football Right breast  LATCH documentation:  Latch:  2 = Grasps breast easily, tongue down, lips flanged, rhythmical sucking.  Audible swallowing:  2 = Spontaneous and intermittent  Type of nipple:  2 = Everted at rest and after stimulation  Comfort (Breast/Nipple):  1 = Filling, red/small blisters or bruises, mild/mod discomfort  Hold (Positioning):  1 = Assistance needed to correctly position infant at breast and maintain latch  LATCH score:  8   Attached assessment:  Deep  Lips flanged:  Yes.    Lips untucked:  Yes.    Suck assessment:  Nutritive  Tools:  None  Instructed on use and cleaning of tool:  No.  Pre-feed weight:  3496 g , 7-11.3 oz  Post-feed weight: 3564 g , 7-13.7 oz  Amount transferred: 68 ml  Amount supplemented:  None needed   Additional Latch:  - baby latched on the left breast and LC was working with mom to obtain the depth. Few sucks and baby released. No weight done. Nipple appeared normal shape.   Total amount pumped post feed. None  ( mom aware to release left  breast down either by hand expressing  LC reviewed prior to latch or use hand pump or DEBP - protect milk supply.   Total amount transferred:  68 ml - excellent for 7 day old  Total supplement given:  None   Lactation Impression:  Baby gained 5 oz since Friday Pedis visit  Breast feeding is going well , except SN On the left breast .  Mom's carpal tunnel is still causing her a challenge with positioning.  @ consult mom only required minimal assist after she latched the baby.  Dad is very helpful.   Lactation Plan of care-   Praised mom for her efforts breast feeding and dad's support  Per mom next Pedis Visit for 11/6  [facility] encouraged mom to consider attending the B[facility] - Monday's 7 pm , Tuesday 11am  Breast feeding goals - protect establishing milk supply  Continue to feed every 2-3 hours  Days- every 2-2 1/2 hours, nights - by 3 hours  STS feedings , check diaper 1st, change if needed  Firm support -  Steps for latching - breast massage , hand express, 15 -20 ml off 1st breast to decrease quick let down  Breast compressions until swallows and comfort achieved . Check lip line for FISH lips  Shift hips back towards back of chair or couch so the breast tissue molds in the baby's mouth.  Will help with soreness. Important - check breast between feedings to make sure they aren't to full, and release as needed with hand expressing ,  Hand pump of DEBP. Save milk.

## 2016-08-17 ENCOUNTER — Telehealth: Payer: Self-pay | Admitting: *Deleted

## 2016-08-17 NOTE — Telephone Encounter (Signed)
Pt informed of Fran's recommendations and verbalized understanding.

## 2016-08-17 NOTE — Telephone Encounter (Signed)
Post partum nurse called to report pts blood pressure readings today were 136/98 in rt arm and 142/92 in left arm.  Pt states she has been checking BP at home in the evenings and mostly running 100-120's on top and 60-80 on bottom.  Pt states she has been having occasional headaches and swelling in legs have resolved since delivery.

## 2016-08-17 NOTE — Telephone Encounter (Signed)
That's fine. Pt should continue to monitor BP and report 140/90 or higher if more than twiced

## 2016-08-22 ENCOUNTER — Ambulatory Visit (HOSPITAL_COMMUNITY)
Admission: RE | Admit: 2016-08-22 | Discharge: 2016-08-22 | Disposition: A | Discharge status: Home / Self Care | Payer: 59 | Source: Ambulatory Visit | Attending: Obstetrics & Gynecology | Admitting: Obstetrics & Gynecology

## 2016-08-22 NOTE — Lactation Note (Signed)
Lactation Consult  [name] ( awake and calm, color pink )  and mom Janet [name] present at [facility] O/P appt.  Per mom breast feeding is going very well and baby is latching both breast.  Left nipple still having issues with some soreness. Using All purpose nipple Cream And it is helping. Baby must be going through a growth spurt due to cluster feeding.  Baby is feeding about every 2 hours days / evenings , and at night often will stretch to 3 hours.  Feedings last for about 30 mins, and she is able to stay latched for 30 mins.  > 6 wets ,> 2-3 greenish stools. ( LC discussed the greens can be an indication the baby isn't getting to the  Creamy fatty milk consistently. Per mom the baby has been exclusively breast feeding. And no pumping.  [facility]  Left nipple has been sore, no breakdown , just sore.   Mother's reason for visit:  Check  On weight , sore nipples  Visit Type: feeding assessment  Appointment Notes: F/U from 10/30 , reassess SN's confirmed 11/10  Consult:  Follow-Up from 10/30 [facility] O/P appt.  Lactation Consultant:  Janet [name]  ________________________________________________________________________ Janet [name] Name:  Janet [name] Date of Birth:  08/01/2016 Pediatrician:  Dr. Harrie [name]  Gender:  female Gestational Age: [redacted]w[redacted]d (At Birth) Birth Weight:  7 lb 8.6 oz (3420 g) Weight at Discharge:  Weight: 7 lb 3 oz (3260 g)                  Date of Discharge:  08/03/2016      [facility] Weights   08/01/16 1154 08/01/16 2330 08/02/16 2310  Weight: 7 lb 8.6 oz (3420 g) 7 lb 7 oz (3374 g) 7 lb 3 oz (3260 g)  Last weight taken from location outside of [facility]: 11/8 - 8-2 oz     Location:[facility] Weight today: 8-9.4 oz   ________________________________________________________________________  [name] Type of delivery:  Vaginal  Breastfeeding Experience: 1st baby  Maternal Medical Conditions:  No risk for  MS , just carpal tunnel syndrome ( slow to resolve per mom )  Maternal Medications: PNV, Motrin ( for carpal Tunnel )   ________________________________________________________________________________________________________________________________________________  Maternal Breast Assessment  Breast:  Full Nipple:  Erect Pain level:  0 Pain interventions:  All purpose nipple cream and Expressed breast milk  _______________________________________________________________________ Feeding Assessment/Evaluation  Initial feeding assessment:  Infant's oral assessment:  High palate   Positioning:  Football Left breast  LATCH documentation:  Latch:  2 = Grasps breast easily, tongue down, lips flanged, rhythmical sucking.  Audible swallowing:  2 = Spontaneous and intermittent  Type of nipple:  2 = Everted at rest and after stimulation  Comfort (Breast/Nipple):  1 = Filling, red/small blisters or bruises, mild/mod discomfort  Hold (Positioning):  1 = Assistance needed to correctly position infant at breast and maintain latch  LATCH score: 8   Attached assessment:  Shallow at 1st , LC flipped upper lip and ease chin and shifted hips back towards the back of chair  Per mom comfortable   Lips flanged:  No.  Lips untucked:  Yes.    Suck assessment:  Nutritive  Tools:  None  Re-weight after 1st weight large wet   Pre-feed weight:  3862, 8-8.2 oz  Post-feed weight: 3942 g , 8-11.1 oz  Amount transferred:  80 ml  Amount supplemented:  Done needed   Additional Feeding Assessment -   Infant's oral assessment:  High palate   Positioning:  Cross cradle Left breast  LATCH documentation:  Latch:  2 = Grasps breast easily, tongue down, lips flanged, rhythmical sucking.  Audible swallowing:  2 = Spontaneous and intermittent  Type of nipple:  2 = Everted at rest and after stimulation  Comfort (Breast/Nipple):  1 = Filling, red/small blisters or bruises, mild/mod discomfort  Hold  (Positioning):  2 = No assistance needed to correctly position infant at breast  LATCH score:  9   Attached assessment:  Deep  Lips flanged:  Yes.    Lips untucked:  Yes.    Suck assessment:  Nutritive  Tools:  None   Pre-feed weight: 3942 g , 8-11.1 oz ,  Post-feed weight:  3982 g , 8-12.4 oz  Amount transferred: 40 ml  Amount supplemented:  None needed   Total amount pumped post feed: no post  pumping needed   Total amount transferred:  120 ml  Total supplement given:  None   Lactation Impression:  LC consult was F/U from 10/30 to check soreness  This mom and baby are doing great with breast feeding.  Baby  transferred off 120 ml ( 4 oz ) at feeding - excellent at 3 weeks old - no spitting noted  Left nipple - still having soreness, - no break down noted.  LC feels the reason for the soreness is mom doesn't consistently achieved the depth due to being  Over full . @ consult we worked on how it is important to release enough milk off the 1st breast so baby is  Latching on a thinner sandwich and depth can be achieved. LC assisted with 1st latch and 2nd latch mom was  Independent and did great , comfort achieved.  See LC plan below.   Lactation Plan of Care:  Praised mom for her efforts .  Continue to Keep it simple  Sore nipple tx - EBM to nipples , All Purpose nipple cream if needed  If to full to latch - express off 20 - 30 ml before the latch - hand express, pre pump hand pump  Breast compressions until swallows , and mom comfortable Options - When not post pumping  1st breast - express down , Latch with depth , check for flanged lips feed - watch for baby's body signals for being done .  Average feeding 15 -20 mins. Offer 2nd breast .  Utilize hand pump or hand expressing  Transitioning back to work 4-5 weeks  Discussed with mom since she isn't returning until Jan 9 th can hold off until 5-6 weeks , but not after that.  Option #1 - feed 1st breast - soften well , pump  other breast if Janet  only feeds on the 1st - pump 10 -15 min s - save milk - freeze  Option #2 - when Janet is up to feeding both breast , post pump both breast 10 -15 mins after am feeding mid day , and when she receives  A bottle evenings with dad - need to pump 15 -20 mins both breast - protect milk supply.

## 2016-09-06 ENCOUNTER — Ambulatory Visit: Payer: 59 | Admitting: Women's Health

## 2016-09-06 ENCOUNTER — Ambulatory Visit (INDEPENDENT_AMBULATORY_CARE_PROVIDER_SITE_OTHER): Payer: 59 | Admitting: Adult Health

## 2016-09-06 ENCOUNTER — Encounter: Payer: Self-pay | Admitting: Adult Health

## 2016-09-06 DIAGNOSIS — O165 Unspecified maternal hypertension, complicating the puerperium: Secondary | ICD-10-CM

## 2016-09-06 DIAGNOSIS — G5603 Carpal tunnel syndrome, bilateral upper limbs: Secondary | ICD-10-CM

## 2016-09-06 DIAGNOSIS — Z3202 Encounter for pregnancy test, result negative: Secondary | ICD-10-CM | POA: Diagnosis not present

## 2016-09-06 LAB — POCT URINE PREGNANCY: PREG TEST UR: NEGATIVE

## 2016-09-06 MED ORDER — HYDROCHLOROTHIAZIDE 25 MG PO TABS: 25.0000 mg | ORAL_TABLET | Freq: Every day | ORAL | 1 refills | Status: DC

## 2016-09-06 MED ORDER — AMLODIPINE BESYLATE 10 MG PO TABS: 10.0000 mg | ORAL_TABLET | Freq: Every day | ORAL | 6 refills | Status: DC

## 2016-09-06 NOTE — Patient Instructions (Signed)
Take BP meds  Increase water Follow up in 1 week

## 2016-09-06 NOTE — Progress Notes (Signed)
Patient ID: Janet [name], female   DOB: [birth year], 30 y.o.   MRN: [id number] Lirio is a 30 year old black female in for postpartum visit. She was induced at 41 weeks and had elevated BP then.She had PPH, and was given cytotec and hemabate. She complains of headache and continued carpal tunnel pain R>L.  Delivery Date:[birth year]  Method of Delivery: Vaginal delivery baby girl Rodman Pickle, 7 lbs 8.6 oz  Sexual Activity since delivery: No  Method of Feeding: Breast   Number of weeks bleeding post delivery: 4 weeks  Review of Systems: Patient denies any hearing loss, fatigue, blurred vision, shortness of breath, chest pain, abdominal pain, problems with bowel movements, urination, or intercourse(not since delivery). No joint pain or mood swings.See HPI for positives.  Reviewed past medical,surgical, social and family history. Reviewed medications and allergies.   Depression Score: 0  BP (!) 162/110 (BP Location: Right Arm, Patient Position: Sitting, Cuff Size: Large)   Pulse 60   Ht 5\' 4"  (1.626 m)   Wt 209 lb 8 oz (95 kg)   LMP 10/19/2015 (Exact Date)   Breastfeeding? Yes   BMI 35.96 kg/m  UPT negative. Skin warm and dry.Lungs: clear to ausculation bilaterally. Cardiovascular: regular rate and rhythm. Pelvic Exam:   External genitalia is normal in appearance, no lesions.  The vagina has good color, moisture and rugae, no lesions.Urethra has no masses or tenderness noted. The cervix is bulbous.  Uterus is felt to be normal size, shape, and contour, well involuted.  No adnexal masses or tenderness noted.Bladder is non tender, no masses felt. Discussed with Dr Elonda Husky, will Rx Norvasc and hydrodiuril.Will not start birth control yet, she is not planning on sex yet.  Impression:  Status post delivery, post partum check, depression screening,post partum hypertension   Plan:   Meds ordered this encounter  Medications  . amLODipine (NORVASC) 10 MG tablet    Sig: Take 1 tablet (10 mg  total) by mouth daily.    Dispense:  30 tablet    Refill:  6    Order Specific Question:   Supervising Provider    Answer:   Elonda Husky, LUTHER H [[id number]]  . hydrochlorothiazide (HYDRODIURIL) 25 MG tablet    Sig: Take 1 tablet (25 mg total) by mouth daily.    Dispense:  30 tablet    Refill:  1    Order Specific Question:   Supervising Provider    Answer:   Florian Buff [[id number]]  Referred to Dr Aline Brochure for CTS Recheck BP in 1 week She wants POP in near future

## 2016-09-11 ENCOUNTER — Other Ambulatory Visit: Payer: Self-pay | Admitting: Women's Health

## 2016-09-13 ENCOUNTER — Encounter: Payer: Self-pay | Admitting: Adult Health

## 2016-09-13 ENCOUNTER — Ambulatory Visit (INDEPENDENT_AMBULATORY_CARE_PROVIDER_SITE_OTHER): Payer: 59 | Admitting: Adult Health

## 2016-09-13 VITALS — BP 104/80 | HR 64 | Ht 64.0 in | Wt 208.5 lb

## 2016-09-13 DIAGNOSIS — D649 Anemia, unspecified: Secondary | ICD-10-CM | POA: Diagnosis not present

## 2016-09-13 DIAGNOSIS — R5383 Other fatigue: Secondary | ICD-10-CM | POA: Insufficient documentation

## 2016-09-13 DIAGNOSIS — O165 Unspecified maternal hypertension, complicating the puerperium: Secondary | ICD-10-CM

## 2016-09-13 MED ORDER — NORETHINDRONE 0.35 MG PO TABS: 1.0000 | ORAL_TABLET | Freq: Every day | ORAL | 11 refills | Status: DC

## 2016-09-13 NOTE — Progress Notes (Signed)
Subjective:     Patient ID: Janet [name], female   DOB: 06-02-1986, 30 y.o.   MRN: [id number]  HPI Synae is a 30 year old black female in for BP check, she never took norvasc but did take hydordiuril, except missed 2 days. She is tired,and had hgb 9.7 10/24 after delivery and is taking iron and PNV.She has noticed decreased milk on one breast.   Review of Systems +tired  Reviewed past medical,surgical, social and family history. Reviewed medications and allergies.     Objective:   Physical Exam BP 104/80 (BP Location: Left Arm, Patient Position: Sitting, Cuff Size: Large)   Pulse 64   Ht 5\' 4"  (1.626 m)   Wt 208 lb 8 oz (94.6 kg)   Breastfeeding? Yes   BMI 35.79 kg/m    PHQ 2 score 0. Skin warm and dry.  Lungs: clear to ausculation bilaterally. Cardiovascular: regular rate and rhythm. Will stop hydrodiuril and recheck BP in 6 days.She has not heard from Dr Harrison's office yet, but they have referral and will call her per Angie.  Assessment:     1. Fatigue, unspecified type   2. Postpartum hypertension       Plan:     Check CBC,CMP,TSH and vitamin D Stop BP meds, and return in 6 days for BP check Use condoms if has sex

## 2016-09-13 NOTE — Patient Instructions (Addendum)
  Stop BP meds, recheck BP in 6 days Use condoms if has sex

## 2016-09-14 LAB — COMPREHENSIVE METABOLIC PANEL
A/G RATIO: 1.4 (ref 1.2–2.2)
ALK PHOS: 106 IU/L (ref 39–117)
ALT: 15 IU/L (ref 0–32)
AST: 20 IU/L (ref 0–40)
Albumin: 4.3 g/dL (ref 3.5–5.5)
BUN/Creatinine Ratio: 10 (ref 9–23)
BUN: 7 mg/dL (ref 6–20)
Bilirubin Total: 0.2 mg/dL (ref 0.0–1.2)
CHLORIDE: 98 mmol/L (ref 96–106)
CO2: 29 mmol/L (ref 18–29)
Calcium: 9.1 mg/dL (ref 8.7–10.2)
Creatinine, Ser: 0.7 mg/dL (ref 0.57–1.00)
GFR calc non Af Amer: 117 mL/min/{1.73_m2} (ref >59)
GFR, EST AFRICAN AMERICAN: 134 mL/min/{1.73_m2} (ref >59)
GLUCOSE: 78 mg/dL (ref 65–99)
Globulin, Total: 3 g/dL (ref 1.5–4.5)
POTASSIUM: 3.4 mmol/L — AB (ref 3.5–5.2)
Sodium: 142 mmol/L (ref 134–144)
Total Protein: 7.3 g/dL (ref 6.0–8.5)

## 2016-09-14 LAB — CBC
Hematocrit: 39.1 % (ref 34.0–46.6)
Hemoglobin: 13.1 g/dL (ref 11.1–15.9)
MCH: 28.2 pg (ref 26.6–33.0)
MCHC: 33.5 g/dL (ref 31.5–35.7)
MCV: 84 fL (ref 79–97)
Platelets: 351 10*3/uL (ref 150–379)
RBC: 4.64 x10E6/uL (ref 3.77–5.28)
RDW: 13.5 % (ref 12.3–15.4)
WBC: 6.2 10*3/uL (ref 3.4–10.8)

## 2016-09-14 LAB — TSH: TSH: 1.42 u[IU]/mL (ref 0.450–4.500)

## 2016-09-14 LAB — VITAMIN D 25 HYDROXY (VIT D DEFICIENCY, FRACTURES): VIT D 25 HYDROXY: 23.7 ng/mL — AB (ref 30.0–100.0)

## 2016-09-20 ENCOUNTER — Ambulatory Visit: Payer: 59 | Admitting: Adult Health

## 2016-09-26 ENCOUNTER — Encounter: Payer: Self-pay | Admitting: Women's Health

## 2016-09-26 ENCOUNTER — Ambulatory Visit (INDEPENDENT_AMBULATORY_CARE_PROVIDER_SITE_OTHER): Payer: 59 | Admitting: Women's Health

## 2016-09-26 ENCOUNTER — Ambulatory Visit (INDEPENDENT_AMBULATORY_CARE_PROVIDER_SITE_OTHER): Payer: 59 | Admitting: Orthopedic Surgery

## 2016-09-26 VITALS — BP 135/87 | HR 70 | Ht 63.0 in | Wt 203.0 lb

## 2016-09-26 VITALS — BP 140/100 | HR 76 | Ht 64.0 in | Wt 209.0 lb

## 2016-09-26 DIAGNOSIS — M654 Radial styloid tenosynovitis [de Quervain]: Secondary | ICD-10-CM

## 2016-09-26 DIAGNOSIS — Z30011 Encounter for initial prescription of contraceptive pills: Secondary | ICD-10-CM

## 2016-09-26 DIAGNOSIS — I10 Essential (primary) hypertension: Secondary | ICD-10-CM | POA: Insufficient documentation

## 2016-09-26 DIAGNOSIS — Z0131 Encounter for examination of blood pressure with abnormal findings: Secondary | ICD-10-CM | POA: Diagnosis not present

## 2016-09-26 DIAGNOSIS — O165 Unspecified maternal hypertension, complicating the puerperium: Secondary | ICD-10-CM

## 2016-09-26 MED ORDER — NORETHINDRONE 0.35 MG PO TABS: ORAL_TABLET | ORAL | 11 refills | Status: DC

## 2016-09-26 MED ORDER — HYDROCHLOROTHIAZIDE 12.5 MG PO TABS: 12.5000 mg | ORAL_TABLET | Freq: Every day | ORAL | 3 refills | Status: DC

## 2016-09-26 NOTE — Patient Instructions (Addendum)
De Quervain Tenosynovitis Introduction Tendons attach muscles to bones. They also help with joint movements. When tendons become irritated or swollen, it is called tendinitis. The extensor pollicis brevis (EPB) tendon connects the EPB muscle to a bone that is near the base of the thumb. The EPB muscle helps to straighten and extend the thumb. De Quervain tenosynovitis is a condition in which the EPB tendon lining (sheath) becomes irritated, thickened, and swollen. This condition is sometimes called stenosing tenosynovitis. This condition causes pain on the thumb side of the back of the wrist. What are the causes? Causes of this condition include:  Activities that repeatedly cause your thumb and wrist to extend.  A sudden increase in activity or change in activity that affects your wrist. What increases the risk? This condition is more likely to develop in:  Females.  People who have diabetes.  Women who have recently given birth.  People who are over 91 years of age.  People who do activities that involve repeated hand and wrist motions, such as tennis, racquetball, volleyball, gardening, and taking care of children.  People who do heavy labor.  People who have poor wrist strength and flexibility.  People who do not warm up properly before activities. What are the signs or symptoms? Symptoms of this condition include:  Pain or tenderness over the thumb side of the back of the wrist when your thumb and wrist are not moving.  Pain that gets worse when you straighten your thumb or extend your thumb or wrist.  Pain when the injured area is touched.  Locking or catching of the thumb joint while you bend and straighten your thumb.  Decreased thumb motion due to pain.  Swelling over the affected area. How is this diagnosed? This condition is diagnosed with a medical history and physical exam. Your health care provider will ask for details about your injury and ask about your  symptoms. How is this treated? Treatment may include the use of icing and medicines to reduce pain and swelling. You may also be advised to wear a splint or brace to limit your thumb and wrist motion. In less severe cases, treatment may also include working with a physical therapist to strengthen your wrist and calm the irritation around your EPB tendon sheath. In severe cases, surgery may be needed. Follow these instructions at home: If you have a splint or brace:  Wear it as told by your health care provider. Remove it only as told by your health care provider.  Loosen the splint or brace if your fingers become numb and tingle, or if they turn cold and blue.  Keep the splint or brace clean and dry. Managing pain, stiffness, and swelling  If directed, apply ice to the injured area.  Put ice in a plastic bag.  Place a towel between your skin and the bag.  Leave the ice on for 20 minutes, 2-3 times per day.  Move your fingers often to avoid stiffness and to lessen swelling.  Raise (elevate) the injured area above the level of your heart while you are sitting or lying down. General instructions  Return to your normal activities as told by your health care provider. Ask your health care provider what activities are safe for you.  Take over-the-counter and prescription medicines only as told by your health care provider.  Keep all follow-up visits as told by your health care provider. This is important.  Do not drive or operate heavy machinery while taking prescription pain  medicine. Contact a health care provider if:  Your pain, tenderness, or swelling gets worse, even if you have had treatment.  You have numbness or tingling in your wrist, hand, or fingers on the injured side. This information is not intended to replace advice given to you by your health care provider. Make sure you discuss any questions you have with your health care provider. Document Released: 09/26/2005  Document Revised: 03/03/2016 Document Reviewed: 12/02/2014  2017 Elsevier

## 2016-09-26 NOTE — Progress Notes (Signed)
   [facility] Visit  Patient name: Janet [name] MRN [id number]  Date of birth: December 09, 1985  CC & HPI:  Janet [name] is a 30 y.o. [id number] African American female presenting today for bp check. 8wks pp, had IOL for pre-e. Was rx'd norvasc and hctz, only took hctz 25mg  and stopped b/c made bp drop too much. Not on anything currently. Denies ha or feeling bad. Breastfeeding, only from [place]- baby won't latch on Rt, feels supply is lower. Wants rx for micronor.  No LMP recorded.  Pertinent History Reviewed:  Medical & Surgical Hx:   Past medical, surgical, family, and social history reviewed in electronic medical record Medications: Reviewed & Updated - see associated section Allergies: Reviewed in electronic medical record  Objective Findings:  Vitals: BP (!) 158/98 (BP Location: Right Arm, Patient Position: Sitting, Cuff Size: Large)   Pulse 76   Ht 5\' 4"  (1.626 m)   Wt 209 lb (94.8 kg)   Breastfeeding? Yes   BMI 35.87 kg/m  Body mass index is 35.87 kg/m.  BP recheck by me: 140/100  Physical Examination: General appearance - alert, well appearing, and in no distress  No results found for this or any previous visit (from the past 24 hour(s)).   Assessment & Plan:  A:   Persistent pp HTN  Contraception management  8wks s/p SVB after IOL for pre-e  Breastfeeding  P:  Rx hctz 12.5mg  daily (25mg  dropped bp too low per pt)  Has appt w/ PCP in 2d  Gave tips to increase milk supply  Rx micronor w/ 11RF, understands has to take at exact same time daily to be effective  Return in about 2 weeks (around 10/10/2016) for F/U. then will plan for 4wks from now and stop hctz 2d prior (will be 12wks out at that point)  Tawnya Crook CNM, [facility] 09/26/2016 3:58 PM

## 2016-09-26 NOTE — Progress Notes (Signed)
Patient ID: Janet [name], female   DOB: [birth year], 30 y.o.   MRN: [id number]  Chief Complaint  Patient presents with  . Carpal Tunnel    Bilateral CTS, referred from [place] at [facility].    HPI Janet [name] is a 30 y.o. female.  Right wrist pain  30 year old female postpartum complains of painful right wrist especially with picking up things, pain over the first extensor compartment worse with ulnar deviation, no trauma. Apparently had symptoms of carpal tunnel syndrome during the pregnancy. She has a history of peripheral neuropathy.  Review of Systems Review of Systems  Respiratory: Negative.   Cardiovascular: Negative.   Neurological: Positive for numbness.   Past Medical History:  Diagnosis Date  . 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 Carpal tunnel syndrome during pregnancy   . 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 Gastroesophageal reflux disease   . Hematuria 09/18/2015  . Menstrual periods irregular 09/18/2015  . Migraine without aura, with intractable migraine, so stated, without mention of status migrainosus 11/11/2013  . Patient desires pregnancy 11/27/2013  . Pregnant 12/16/2015  . Vitamin D deficiency     Past Surgical History:  Procedure Laterality Date  . CHOLECYSTECTOMY  02/2015  . COLONOSCOPY N/A 04/19/2013   [id number] mucosa in the terminal ileum/Single erosion in transverse colon/RECTAL BLEEDING DUE TO Moderate sized internal hemorrhoids/ trv colon erosion, bx benign.  . ESOPHAGOGASTRODUODENOSCOPY N/A 04/19/2013   [id number] Non-erosive gastritis/PERI-UMBILICAL PAIN DUE TO GERD, GASTRITIS, AND CONSTIPATION. Bx with mild chronic inactive gastritis. No H.Pylori  . HEMORRHOID BANDING  2015   Dr. Gala Romney- in office banding procedure  . TONSILLECTOMY    . 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 Smoking status: Never Smoker  . Smokeless tobacco: Never Used  . Alcohol use No    Allergies  Allergen Reactions  . Methylprednisolone Shortness Of Breath, Itching and Other (See Comments)    Reaction:  Bruising   . Penicillins Anaphylaxis, Rash and Other (See Comments)    Has patient had a PCN reaction causing immediate rash, facial/tongue/throat swelling, SOB or lightheadedness with hypotension: No Has patient had a PCN reaction causing severe rash involving mucus membranes or skin necrosis: No Has patient had a PCN reaction that required hospitalization No Has patient had a PCN reaction occurring within the last 10 years: No If all of the above answers are "NO", then may proceed with Cephalosporin use.  . Latex Rash    No outpatient prescriptions have been marked as taking for the 09/26/16 encounter (Office Visit) with Carole Civil, MD.      Physical Exam Physical Exam BP 135/87   Pulse 70   Ht 5\' 3"  (1.6 m)   Wt 203 lb (92.1 kg)   BMI 35.96 kg/m   Gen. appearance. The patient is well-developed and well-nourished, grooming and hygiene are normal. There are no gross congenital abnormalities  The patient is alert and oriented to person place and time  Mood and affect are normal  Ambulation normal  Examination reveals the following: On inspection we find tenderness over the first extensor compartment mild swelling  With the range of motion of  right wrist normal with painful ulnar deviation  Stability tests were normal  Strength tests revealed grade 5 motor strength  Skin we find no rash ulceration or erythema  Sensation remains intact  Impression vascular system shows no peripheral edema  Data Reviewed   Assessment    Encounter Diagnosis  Name Primary?  Tennis Must Quervain's disease (radial styloid tenosynovitis) Yes       Plan    Splint 6 weeks  Recheck in 6 weeks        Arther Abbott 09/26/2016, 4:19 PM

## 2016-09-26 NOTE — Patient Instructions (Signed)
Tips To Increase Milk Supply  Lots of water! Enough so that your urine is clear  Plenty of calories, if you're not getting enough calories, your milk supply can decrease  Breastfeed/pump often, every 2-3 hours x 20-30mins  Fenugreek 3 pills 3 times a day, this may make your urine smell like maple syrup  Mother's Milk Tea  Lactation cookies, google for the recipe  Real oatmeal   

## 2016-09-28 ENCOUNTER — Encounter: Payer: Self-pay | Admitting: Family Medicine

## 2016-09-28 ENCOUNTER — Ambulatory Visit (INDEPENDENT_AMBULATORY_CARE_PROVIDER_SITE_OTHER): Payer: 59 | Admitting: Family Medicine

## 2016-09-28 VITALS — BP 140/88 | HR 86 | Resp 16 | Ht 63.0 in | Wt 206.0 lb

## 2016-09-28 DIAGNOSIS — E559 Vitamin D deficiency, unspecified: Secondary | ICD-10-CM

## 2016-09-28 DIAGNOSIS — O165 Unspecified maternal hypertension, complicating the puerperium: Secondary | ICD-10-CM | POA: Diagnosis not present

## 2016-09-28 DIAGNOSIS — E6609 Other obesity due to excess calories: Secondary | ICD-10-CM

## 2016-09-28 DIAGNOSIS — IMO0001 Reserved for inherently not codable concepts without codable children: Secondary | ICD-10-CM

## 2016-09-28 NOTE — Progress Notes (Signed)
   Janet [name]     MRN: [id number]      DOB: Mar 21, 1986   HPI [name] is here for follow up and re-evaluation of chronic medical conditions, medication management and review of any available recent lab and radiology data.  Preventive health is updated, specifically  Cancer screening and Immunization.   Has newborn infant , and is breast feeding. Has had elevated blood pressure post partum and is here to address this   ROS Denies recent fever or chills. Denies sinus pressure, nasal congestion, ear pain or sore throat. Denies chest congestion, productive cough or wheezing. Denies chest pains, palpitations and leg swelling Denies abdominal pain, nausea, vomiting,diarrhea or constipation.   Denies dysuria, frequency, hesitancy or incontinence. Denies joint pain, swelling and limitation in mobility. Denies headaches, seizures, numbness, or tingling. Denies depression, anxiety or insomnia. Denies skin break down or rash.   PE  BP 140/88   Pulse 86   Resp 16   Ht 5\' 3"  (1.6 m)   Wt 206 lb (93.4 kg)   SpO2 97%   BMI 36.49 kg/m   Patient alert and oriented and in no cardiopulmonary distress.  HEENT: No facial asymmetry, EOMI,   oropharynx pink and moist.  Neck supple no JVD, no mass.  Chest: Clear to auscultation bilaterally.  CVS: S1, S2 no murmurs, no S3.Regular rate.  ABD: Soft non tender.   Ext: No edema  MS: Adequate ROM spine, shoulders, hips and knees.  Skin: Intact, no ulcerations or rash noted.  Psych: Good eye contact, normal affect. Memory intact not anxious or depressed appearing.  CNS: CN 2-12 intact, power,  normal throughout.no focal deficits noted.   Assessment & Plan  Postpartum hypertension DASH diet and commitment to daily physical activity for a minimum of 30 minutes discussed and encouraged, as a part of hypertension management. The importance of attaining a healthy weight is also discussed.  BP/Weight 09/28/2016 09/26/2016 09/26/2016  09/13/2016 09/06/2016 08/03/2016 [id number]  Systolic BP XX123456 XX123456 A999333 [id number] -  Diastolic BP 88 123XX123 87 80 [id number] 86 -  Wt. (Lbs) 206 209 203 208.5 209.5 - 234  BMI 36.49 35.87 35.96 35.79 35.96 - 40.17   Lifestyle management only at this time    Obesity Deteriorated. Patient re-educated about  the importance of commitment to a  minimum of 150 minutes of exercise per week.  The importance of healthy food choices with portion control discussed. Encouraged to start a food diary, count calories and to consider  joining a support group. Sample diet sheets offered. Goals set by the patient for the next several months.   Weight /BMI 09/28/2016 09/26/2016 09/26/2016  WEIGHT 206 lb 209 lb 203 lb  HEIGHT 5\' 3"  5\' 4"  5\' 3"   BMI 36.49 kg/m2 35.87 kg/m2 35.96 kg/m2      Vitamin D deficiency Commit to prenatal vitamin, no specific vit D supplement

## 2016-09-28 NOTE — Patient Instructions (Addendum)
F/U IN  3.5 MONTHS , CALL IF YOU NEED ME BEFORE  RECENT LABS LOOK GOOD  BLOOD PRESSURE ELEVATED, BUT YOU DO NOT NEED MEDICATION, FOCUS ON LIFESTYLE  It is important that you exercise regularly at least 30 minutes 7 times a week. If you develop chest pain, have severe difficulty breathing, or feel very tired, stop exercising immediately and seek medical attention   wERIGHT LOSS GOAL OF 3 TO 4 POUNDS PER MONTH   DASH Eating Plan DASH stands for "Dietary Approaches to Stop Hypertension." The DASH eating plan is a healthy eating plan that has been shown to reduce high blood pressure (hypertension). Additional health benefits may include reducing the risk of type 2 diabetes mellitus, heart disease, and stroke. The DASH eating plan may also help with weight loss. What do I need to know about the DASH eating plan? For the DASH eating plan, you will follow these general guidelines:  Choose foods with less than 150 milligrams of sodium per serving (as listed on the food label).  Use salt-free seasonings or herbs instead of table salt or sea salt.  Check with your health care provider or pharmacist before using salt substitutes.  Eat lower-sodium products. These are often labeled as "low-sodium" or "no salt added."  Eat fresh foods. Avoid eating a lot of canned foods.  Eat more vegetables, fruits, and low-fat dairy products.  Choose whole grains. Look for the word "whole" as the first word in the ingredient list.  Choose fish and skinless chicken or [place] more often than red meat. Limit fish, poultry, and meat to 6 oz (170 g) each day.  Limit sweets, desserts, sugars, and sugary drinks.  Choose heart-healthy fats.  Eat more home-cooked food and less restaurant, buffet, and fast food.  Limit fried foods.  Do not fry foods. Cook foods using methods such as baking, boiling, grilling, and broiling instead.  When eating at a restaurant, ask that your food be prepared with less salt, or no  salt if possible. What foods can I eat? Seek help from a dietitian for individual calorie needs. Grains  Whole grain or whole wheat bread. Brown rice. Whole grain or whole wheat pasta. Quinoa, bulgur, and whole grain cereals. Low-sodium cereals. Corn or whole wheat flour tortillas. Whole grain cornbread. Whole grain crackers. Low-sodium crackers. Vegetables  Fresh or frozen vegetables (raw, steamed, roasted, or grilled). Low-sodium or reduced-sodium tomato and vegetable juices. Low-sodium or reduced-sodium tomato sauce and paste. Low-sodium or reduced-sodium canned vegetables. Fruits  All fresh, canned (in natural juice), or frozen fruits. Meat and Other Protein Products  Ground beef (85% or leaner), grass-fed beef, or beef trimmed of fat. Skinless chicken or [place]. Ground chicken or [place]. Pork trimmed of fat. All fish and seafood. Eggs. Dried beans, peas, or lentils. Unsalted nuts and seeds. Unsalted canned beans. Dairy  Low-fat dairy products, such as skim or 1% milk, 2% or reduced-fat cheeses, low-fat ricotta or cottage cheese, or plain low-fat yogurt. Low-sodium or reduced-sodium cheeses. Fats and Oils  Tub margarines without trans fats. Light or reduced-fat mayonnaise and salad dressings (reduced sodium). Avocado. Safflower, olive, or canola oils. Natural peanut or almond butter. Other  Unsalted popcorn and pretzels. The items listed above may not be a complete list of recommended foods or beverages. Contact your dietitian for more options.  What foods are not recommended? Grains  White bread. White pasta. White rice. Refined cornbread. Bagels and croissants. Crackers that contain trans fat. Vegetables  Creamed or fried vegetables. Vegetables  in a cheese sauce. Regular canned vegetables. Regular canned tomato sauce and paste. Regular tomato and vegetable juices. Fruits  Canned fruit in light or heavy syrup. Fruit juice. Meat and Other Protein Products  Fatty cuts of meat. Ribs,  chicken wings, bacon, sausage, bologna, salami, chitterlings, fatback, hot dogs, bratwurst, and packaged luncheon meats. Salted nuts and seeds. Canned beans with salt. Dairy  Whole or 2% milk, cream, half-and-half, and cream cheese. Whole-fat or sweetened yogurt. Full-fat cheeses or blue cheese. Nondairy creamers and whipped toppings. Processed cheese, cheese spreads, or cheese curds. Condiments  Onion and garlic salt, seasoned salt, table salt, and sea salt. Canned and packaged gravies. Worcestershire sauce. Tartar sauce. Barbecue sauce. Teriyaki sauce. Soy sauce, including reduced sodium. Steak sauce. Fish sauce. Oyster sauce. Cocktail sauce. Horseradish. Ketchup and mustard. Meat flavorings and tenderizers. Bouillon cubes. Hot sauce. Tabasco sauce. Marinades. Taco seasonings. Relishes. Fats and Oils  Butter, stick margarine, lard, shortening, ghee, and bacon fat. Coconut, palm kernel, or palm oils. Regular salad dressings. Other  Pickles and olives. Salted popcorn and pretzels. The items listed above may not be a complete list of foods and beverages to avoid. Contact your dietitian for more information.  Where can I find more information? National Heart, Lung, and Blood Institute: travelstabloid.com This information is not intended to replace advice given to you by your health care provider. Make sure you discuss any questions you have with your health care provider. Document Released: 09/15/2011 Document Revised: 03/03/2016 Document Reviewed: 07/31/2013 Elsevier Interactive Patient Education  2017 [employer].

## 2016-09-28 NOTE — Assessment & Plan Note (Signed)
Commit to prenatal vitamin, no specific vit D supplement

## 2016-09-28 NOTE — Assessment & Plan Note (Signed)
DASH diet and commitment to daily physical activity for a minimum of 30 minutes discussed and encouraged, as a part of hypertension management. The importance of attaining a healthy weight is also discussed.  BP/Weight 09/28/2016 09/26/2016 09/26/2016 09/13/2016 09/06/2016 08/03/2016 [id number]  Systolic BP XX123456 XX123456 A999333 123456 0000000 A999333 -  Diastolic BP 88 123XX123 87 80 A999333 86 -  Wt. (Lbs) 206 209 203 208.5 209.5 - 234  BMI 36.49 35.87 35.96 35.79 35.96 - 40.17   Lifestyle management only at this time

## 2016-09-28 NOTE — Assessment & Plan Note (Signed)
Deteriorated. Patient re-educated about  the importance of commitment to a  minimum of 150 minutes of exercise per week.  The importance of healthy food choices with portion control discussed. Encouraged to start a food diary, count calories and to consider  joining a support group. Sample diet sheets offered. Goals set by the patient for the next several months.   Weight /BMI 09/28/2016 09/26/2016 09/26/2016  WEIGHT 206 lb 209 lb 203 lb  HEIGHT 5\' 3"  5\' 4"  5\' 3"   BMI 36.49 kg/m2 35.87 kg/m2 35.96 kg/m2

## 2016-09-29 ENCOUNTER — Ambulatory Visit (INDEPENDENT_AMBULATORY_CARE_PROVIDER_SITE_OTHER): Payer: 59 | Admitting: Neurology

## 2016-09-29 ENCOUNTER — Encounter: Payer: Self-pay | Admitting: Neurology

## 2016-09-29 VITALS — BP 146/82 | HR 70 | Resp 16 | Ht 63.0 in | Wt 207.0 lb

## 2016-09-29 DIAGNOSIS — G935 Compression of brain: Secondary | ICD-10-CM | POA: Diagnosis not present

## 2016-09-29 NOTE — Progress Notes (Signed)
Reason for visit: Headache  Referring physician: Dr. Lucendia Herrlich is a 30 y.o. female  History of present illness:  Ms. Hain is a 30 year old right-handed black female with a history of Arnold-Chiari type I and a history of migraine headaches. The patient has been seen through this office in February 2015 with what appeared to be typical migraine. The headaches at that time were occurring on the right or left side of the head associated with nausea vomiting, photophobia and phonophobia. The patient has developed a different type of headache at this point. She is not having her true migraine, but she will have brief headache episodes that may come on suddenly in the temporal and frontal areas bilaterally and in the occipital area. The patient will have increased headache pain if she bends over, she may induce the headache by laughing vigorously or lifting something heavy. The patient recently delivered a child, she has had one or 2 such headaches a week at this point. The patient denies any numbness or weakness of extremities, she is not having nausea or vomiting. She denies any photophobia or phonophobia with the headache. She has not had any change in balance or difficulty controlling the bowels or the bladder. The last MRI of the brain done in 2015 showed a 9 mm depression of the cerebellar tonsils, this had progressed from the prior study from 5 to 6 mm.  Past Medical History:  Diagnosis Date  . 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 Carpal tunnel syndrome during pregnancy   . 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 Gastroesophageal reflux disease   . Hematuria 09/18/2015  . Menstrual periods irregular 09/18/2015  . Migraine without aura, with intractable migraine, so stated, without mention of status migrainosus  11/11/2013  . Patient desires pregnancy 11/27/2013  . Pregnant 12/16/2015  . Vitamin D deficiency     Past Surgical History:  Procedure Laterality Date  . CHOLECYSTECTOMY  02/2015  . COLONOSCOPY N/A 04/19/2013   [id number] mucosa in the terminal ileum/Single erosion in transverse colon/RECTAL BLEEDING DUE TO Moderate sized internal hemorrhoids/ trv colon erosion, bx benign.  . ESOPHAGOGASTRODUODENOSCOPY N/A 04/19/2013   [id number] Non-erosive gastritis/PERI-UMBILICAL PAIN DUE TO GERD, GASTRITIS, AND CONSTIPATION. Bx with mild chronic inactive gastritis. No H.Pylori  . HEMORRHOID BANDING  2015   Dr. Gala Romney- in office banding procedure  . TONSILLECTOMY    . 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 Hypertension Mother   . Hyperlipidemia Mother   . Hypertension Father   . Hypertension Maternal Grandmother   . Diabetes Maternal Grandmother   . Diabetes Paternal Grandmother   . COPD Paternal Grandfather   . Heart disease Paternal Grandfather   . Colon cancer Other     maternal great uncle  . Stroke Maternal Aunt   . Migraines Maternal Aunt     Social history:  reports that she has never smoked. She has never used smokeless tobacco. She reports that she does not drink alcohol or use drugs.  Medications:  Prior to Admission medications   Medication Sig Start Date End Date Taking? Authorizing Provider  ferrous sulfate 325 (65 FE) MG tablet Take 1 tablet (325 mg total) by mouth 2 (two) times daily with  a meal. 05/03/16  Yes Roma Schanz, CNM  pantoprazole (PROTONIX) 40 MG tablet Take 40 mg by mouth daily.   Yes Historical Provider, MD  prenatal vitamin w/FE, FA (PRENATAL 1 + 1) 27-1 MG TABS tablet Take 1 tablet by mouth daily.   Yes Historical Provider, MD  norethindrone (MICRONOR,CAMILA,ERRIN) 0.35 MG tablet 1 tablet by mouth at same time daily Patient not taking: Reported on 09/29/2016 09/26/16   Roma Schanz, CNM        Allergies  Allergen Reactions  . Methylprednisolone Shortness Of Breath, Itching and Other (See Comments)    Reaction:  Bruising   . Penicillins Anaphylaxis, Rash and Other (See Comments)    Has patient had a PCN reaction causing immediate rash, facial/tongue/throat swelling, SOB or lightheadedness with hypotension: No Has patient had a PCN reaction causing severe rash involving mucus membranes or skin necrosis: No Has patient had a PCN reaction that required hospitalization No Has patient had a PCN reaction occurring within the last 10 years: No If all of the above answers are "NO", then may proceed with Cephalosporin use.  . Latex Rash    ROS:  Out of a complete 14 system review of symptoms, the patient complains only of the following symptoms, and all other reviewed systems are negative.  Headache  Blood pressure (!) 146/82, pulse 70, resp. rate 16, height 5\' 3"  (1.6 m), weight 207 lb (93.9 kg), currently breastfeeding.  Physical Exam  General: The patient is alert and cooperative at the time of the examination. The patient is moderately obese.  Eyes: Pupils are equal, round, and reactive to light. Discs are flat bilaterally.  Neck: The neck is supple, no carotid bruits are noted.  Respiratory: The respiratory examination is clear.  Cardiovascular: The cardiovascular examination reveals a regular rate and rhythm, no obvious murmurs or rubs are noted.  Skin: Extremities are without significant edema.  Neurologic Exam  Mental status: The patient is alert and oriented x 3 at the time of the examination. The patient has apparent normal recent and remote memory, with an apparently normal attention span and concentration ability.  Cranial nerves: Facial symmetry is present. There is good sensation of the face to pinprick and soft touch bilaterally. The strength of the facial muscles and the muscles to head turning and shoulder shrug are normal bilaterally. Speech is well  enunciated, no aphasia or dysarthria is noted. Extraocular movements are full. Visual fields are full. The tongue is midline, and the patient has symmetric elevation of the soft palate. No obvious hearing deficits are noted.  Motor: The motor testing reveals 5 over 5 strength of all 4 extremities. Good symmetric motor tone is noted throughout.  Sensory: Sensory testing is intact to pinprick, soft touch, vibration sensation, and position sense on all 4 extremities. No evidence of extinction is noted.  Coordination: Cerebellar testing reveals good finger-nose-finger and heel-to-shin bilaterally.  Gait and station: Gait is normal. Tandem gait is normal. Romberg is negative. No drift is seen.  Reflexes: Deep tendon reflexes are symmetric and normal bilaterally. Toes are downgoing bilaterally.   Assessment/Plan:  1. Arnold-Chiari type I malformation  2. History of migraine headache  The current headaches described by the patient did not appear to be consistent with migraine. They could be related to a spinal fluid plugging phenomenon from the Arnold-Chiari malformation. The patient will be set up for MRI evaluation of the brain, she will follow-up in about 5 months. She will contact me if the headaches  worsen.  Jill Alexanders MD 09/29/2016 2:28 PM  [facility] [address] [place], [place] [address]  Phone [phone] Fax [phone]

## 2016-10-05 ENCOUNTER — Ambulatory Visit: Payer: 59 | Admitting: Family Medicine

## 2016-10-06 ENCOUNTER — Ambulatory Visit (HOSPITAL_COMMUNITY)
Admission: RE | Admit: 2016-10-06 | Discharge: 2016-10-06 | Disposition: A | Discharge status: Home / Self Care | Payer: 59 | Source: Ambulatory Visit | Attending: Neurology | Admitting: Neurology

## 2016-10-06 ENCOUNTER — Telehealth: Payer: Self-pay | Admitting: Neurology

## 2016-10-06 DIAGNOSIS — G935 Compression of brain: Secondary | ICD-10-CM | POA: Diagnosis present

## 2016-10-06 NOTE — Telephone Encounter (Signed)
I called patient. No change in the MRI evaluation in regards to the cerebellar ectopia. The patient will contact me if her headache syndrome changes.   MRI brain 10/06/16:  IMPRESSION: 1. Chronic cerebellar tonsillar ectopia, not significantly changed over this series of exams and without associated signal abnormality in the brainstem, cerebellum, or upper spinal cord. Such ectopia can be seen with both intracranial hypotension and intracranial hypertension (pseudotumor cerebri), in addition to Chiari 1 malformation. 2. Otherwise normal noncontrast MRI appearance of the brain.

## 2016-10-11 ENCOUNTER — Other Ambulatory Visit: Payer: Self-pay | Admitting: *Deleted

## 2016-10-11 MED ORDER — PRENATAL PLUS 27-1 MG PO TABS: 1.0000 | ORAL_TABLET | Freq: Every day | ORAL | 11 refills | Status: DC

## 2016-10-13 ENCOUNTER — Ambulatory Visit: Payer: 59 | Admitting: Women's Health

## 2016-10-18 ENCOUNTER — Telehealth: Payer: Self-pay | Admitting: *Deleted

## 2016-10-18 NOTE — Telephone Encounter (Signed)
Pt informed note for work is up front to be picked up.  Pt states she will come by tomorrow and get it.

## 2016-11-07 ENCOUNTER — Ambulatory Visit: Payer: 59 | Admitting: Orthopedic Surgery

## 2017-01-24 ENCOUNTER — Telehealth: Payer: Self-pay | Admitting: Family Medicine

## 2017-01-24 ENCOUNTER — Encounter: Payer: Self-pay | Admitting: Family Medicine

## 2017-01-24 ENCOUNTER — Ambulatory Visit (INDEPENDENT_AMBULATORY_CARE_PROVIDER_SITE_OTHER): Payer: 59 | Admitting: Family Medicine

## 2017-01-24 VITALS — BP 120/90 | HR 72 | Temp 98.1°F | Resp 16 | Ht 63.0 in | Wt 220.0 lb

## 2017-01-24 DIAGNOSIS — J029 Acute pharyngitis, unspecified: Secondary | ICD-10-CM | POA: Diagnosis not present

## 2017-01-24 LAB — POCT RAPID STREP A (OFFICE): Rapid Strep A Screen: NEGATIVE

## 2017-01-24 NOTE — Patient Instructions (Signed)
Rest Push fluids Take ibuprofen or tylenol as needed pain, chills, fever, aches Call for problems  Work note

## 2017-01-24 NOTE — Telephone Encounter (Signed)
Schedule for a work in today with [employer]

## 2017-01-24 NOTE — Telephone Encounter (Signed)
Patient aware and will be here at 4

## 2017-01-24 NOTE — Telephone Encounter (Signed)
Needs to be screened for strep.  I can work her in this afternoon.  Schedule at 4 and ask her to be here by 3:30.  Pre order strep screen on arrival.  thanks

## 2017-01-24 NOTE — Telephone Encounter (Signed)
I had sent a msg for her to be worked in today but schedule is full. No fever that she knows of but she is achy and sore/chills and sore throat x yesterday. Please advise

## 2017-01-24 NOTE — Progress Notes (Signed)
Janet [name] is a 31 y.o. female presenting with a sore throat for 2 days.  Associated symptoms include:  fever, chills and headache.  Symptoms are progressively worsening.  Home treatment thus far includes:  rest and hydration.  No known sick contacts with similar symptoms.  There is no history of of similar symptoms.  Exam:  BP 120/90   Pulse 72   Temp 98.1 F (36.7 C) (Oral)   Resp 16   Ht 5\' 3"  (1.6 m)   Wt 220 lb (99.8 kg)   SpO2 100%   BMI 38.97 kg/m  Constitutional: No acute distress tired,  HEENT head normocephalic and atraumatic, TMs and external auditory canals are normal. Nasal membranes are normal. Oropharynx is benign, dentition in good repair. Posterior pharynx is only mildly injected. There is no exudate. Tonsils are absent. Neck supple with no adenopathy. No thyromegaly. Heart regular with no murmur or gallop Lungs lungs are clear to auscultation  Skin clear and no rash  Sore throat - Plan: POCT rapid strep A Strep test is negative  Patient Instructions  Rest Push fluids Take ibuprofen or tylenol as needed pain, chills, fever, aches Call for problems  Work note

## 2017-01-24 NOTE — Telephone Encounter (Signed)
Patient calling with the following symptoms:  Sore body, chills, and sore throat. She states it started yesterday. She is breastfeeding her 41 month old and is concerned because of her symptoms.  She is currently at work in [place] and is scheduled till 1:30, but is willing to leave work to be seen and will take any appt available today if Dr. Meda Coffee is able to work her in.   cb  [phone]

## 2017-01-27 ENCOUNTER — Telehealth: Payer: Self-pay | Admitting: Family Medicine

## 2017-01-27 NOTE — Telephone Encounter (Signed)
Patient was in Tuesday and was treated for sore throat.  Patient has been gargling with salt water and taking tylenol  She feels like it is not getting better. She isn't feeling as achy and no longer chilled, but is wondering if there is anything else she can try.  Patient not working today.  Please advise

## 2017-01-27 NOTE — Telephone Encounter (Signed)
OTC cough and cold medicines should help.

## 2017-01-31 ENCOUNTER — Encounter: Payer: Self-pay | Admitting: Family Medicine

## 2017-01-31 ENCOUNTER — Ambulatory Visit (INDEPENDENT_AMBULATORY_CARE_PROVIDER_SITE_OTHER): Payer: 59 | Admitting: Family Medicine

## 2017-01-31 VITALS — BP 150/100 | HR 86 | Temp 98.2°F | Resp 16 | Ht 63.0 in | Wt 222.0 lb

## 2017-01-31 DIAGNOSIS — G4489 Other headache syndrome: Secondary | ICD-10-CM

## 2017-01-31 DIAGNOSIS — I10 Essential (primary) hypertension: Secondary | ICD-10-CM

## 2017-01-31 DIAGNOSIS — J040 Acute laryngitis: Secondary | ICD-10-CM

## 2017-01-31 DIAGNOSIS — E249 Cushing's syndrome, unspecified: Secondary | ICD-10-CM

## 2017-01-31 MED ORDER — METHYLDOPA 250 MG PO TABS: 250.0000 mg | ORAL_TABLET | Freq: Two times a day (BID) | ORAL | 2 refills | Status: DC

## 2017-01-31 NOTE — Assessment & Plan Note (Signed)
Start aldomet DASH diet and commitment to daily physical activity for a minimum of 30 minutes discussed and encouraged, as a part of hypertension management. The importance of attaining a healthy weight is also discussed.  BP/Weight 01/31/2017 01/24/2017 09/29/2016 09/28/2016 09/26/2016 09/26/2016 62/06/5283  Systolic BP 132 440 102 725 366 440 347  Diastolic BP 425 90 82 88 956 87 80  Wt. (Lbs) 222 220 207 206 209 203 208.5  BMI 39.33 38.97 36.67 36.49 35.87 35.96 35.79     Work excusex 1 week

## 2017-01-31 NOTE — Patient Instructions (Signed)
f/u in 2 weeks , call if you need me sooner.  You are referred to [facility]  For re evaluation due to cushing history, with weight gain and uncontrolled bloopd pressure  Start aldomet one twice daily, 12 hours apart  For blood pressure   Work excuse from tomorrow , return next week Monday.  HBA1C, chem 7 and eGFR  2 days befre your follow upo  For your allergy symptoms, do saline nose flushes and gargles, no medication sure to be safe with breast feeding

## 2017-02-01 NOTE — Assessment & Plan Note (Signed)
Pt with new weight gain and hypertension, has been diagnosed and treated in the past for Cushing's needs ree vealuation, refer to dr Loanne Drilling

## 2017-02-03 ENCOUNTER — Telehealth: Payer: Self-pay | Admitting: Endocrinology

## 2017-02-03 DIAGNOSIS — J040 Acute laryngitis: Secondary | ICD-10-CM | POA: Insufficient documentation

## 2017-02-03 NOTE — Assessment & Plan Note (Signed)
Deteriorated. Patient re-educated about  the importance of commitment to a  minimum of 150 minutes of exercise per week.  The importance of healthy food choices with portion control discussed. Encouraged to start a food diary, count calories and to consider  joining a support group. Sample diet sheets offered. Goals set by the patient for the next several months.   Weight /BMI 01/31/2017 01/24/2017 09/29/2016  WEIGHT 222 lb 220 lb 207 lb  HEIGHT 5\' 3"  5\' 3"  5\' 3"   BMI 39.33 kg/m2 38.97 kg/m2 36.67 kg/m2  needs re eval asap for recurrent  underlying adrenal disease

## 2017-02-03 NOTE — Assessment & Plan Note (Signed)
Sore throat and laryngitis, no infectious cause on exam and history,  More related to allergies, voice rest and saline gargles recommended, work excuse x 1 week

## 2017-02-03 NOTE — Progress Notes (Signed)
Janet [name]     MRN: [id number]      DOB: Mar 12, 1986   HPI Janet [name] is here having been seen 1 week ago with c/o sore throat , negative strep test and cough. Her symptoms persist and she is hoarse with difficulty speaking. She also c/o headache and fatigue. She denies sputum or ear pain She still breast feeds She notes that her blood pressure has started climbing as has her weight since her return to work despite her desperate efforts at strict dietary discipline to control and lose weight. She has had left adrenalectomy in 2012 at [facility] for left adrenals adenoma causing Cushing's syndrome, the right gland was left and now she is parenting with unexplained weight gain and uncontrolled blood pressure, and will need re evaluation by endocrinology ROS See HPI  c/o fatigue and headache and loss of voice  Denies sinus pressure, nasal congestion, ear pain Denies chest congestion, productive cough or wheezing. Denies chest pains, palpitations and leg swelling Denies abdominal pain, nausea, vomiting,diarrhea or constipation.   Denies dysuria, frequency, hesitancy or incontinence. Denies joint pain, swelling and limitation in mobility. Denies , seizures, numbness, or tingling. Denies depression, anxiety or insomnia. Denies skin break down or rash.   PE  BP (!) 150/100   Pulse 86   Temp 98.2 F (36.8 C) (Oral)   Resp 16   Ht 5\' 3"  (1.6 m)   Wt 222 lb (100.7 kg)   SpO2 100%   BMI 39.33 kg/m   Patient alert ill appearing, oriented HEENT: No facial asymmetry, EOMI,   oropharynx pink and moist.  Neck supple no JVD, no mass.No cerivical adenitis, TM clear  Chest: Clear to auscultation bilaterally.  CVS: S1, S2 no murmurs, no S3.Regular rate.  ABD: Soft non tender.   Ext: No edema  MS: Adequate ROM spine, shoulders, hips and knees.  Skin: Intact, no ulcerations or rash noted.  Psych: Good eye contact, normal affect. Memory intact not anxious or depressed appearing.  CNS: CN  2-12 intact, power,  normal throughout.no focal deficits noted.   Assessment & Plan  Essential hypertension Start aldomet DASH diet and commitment to daily physical activity for a minimum of 30 minutes discussed and encouraged, as a part of hypertension management. The importance of attaining a healthy weight is also discussed.  BP/Weight 01/31/2017 01/24/2017 09/29/2016 09/28/2016 09/26/2016 09/26/2016 92/12/3005  Systolic BP 622 633 354 562 563 893 734  Diastolic BP 287 90 82 88 681 87 80  Wt. (Lbs) 222 220 207 206 209 203 208.5  BMI 39.33 38.97 36.67 36.49 35.87 35.96 35.79     Work excusex 1 week  Hypercortisolism (HCC) Pt with new weight gain and hypertension, has been diagnosed and treated in the past for Cushing's needs ree vealuation, refer to dr Loanne Drilling  Headache New headache with no focal neurologic deficits on exam associated with uncontrolled hypertension. Start treatment for hypertension and work excuse for 1 week. ED for worsening headache Re evaluate in 2 weeks DASH diet and commitment to daily physical activity for a minimum of 30 minutes discussed and encouraged, as a part of hypertension management. The importance of attaining a healthy weight is also discussed.  BP/Weight 01/31/2017 01/24/2017 09/29/2016 09/28/2016 09/26/2016 09/26/2016 15/04/2619  Systolic BP 355 974 163 845 364 680 321  Diastolic BP 224 90 82 88 825 87 80  Wt. (Lbs) 222 220 207 206 209 203 208.5  BMI 39.33 38.97 36.67 36.49 35.87 35.96 35.79  Obesity Deteriorated. Patient re-educated about  the importance of commitment to a  minimum of 150 minutes of exercise per week.  The importance of healthy food choices with portion control discussed. Encouraged to start a food diary, count calories and to consider  joining a support group. Sample diet sheets offered. Goals set by the patient for the next several months.   Weight /BMI 01/31/2017 01/24/2017 09/29/2016  WEIGHT 222 lb 220 lb 207  lb  HEIGHT 5\' 3"  5\' 3"  5\' 3"   BMI 39.33 kg/m2 38.97 kg/m2 36.67 kg/m2  needs re eval asap for recurrent  underlying adrenal disease    Laryngitis Sore throat and laryngitis, no infectious cause on exam and history,  More related to allergies, voice rest and saline gargles recommended, work excuse x 1 week

## 2017-02-03 NOTE — Assessment & Plan Note (Addendum)
New headache with no focal neurologic deficits on exam associated with uncontrolled hypertension. Start treatment for hypertension and work excuse for 1 week. ED for worsening headache Re evaluate in 2 weeks DASH diet and commitment to daily physical activity for a minimum of 30 minutes discussed and encouraged, as a part of hypertension management. The importance of attaining a healthy weight is also discussed.  BP/Weight 01/31/2017 01/24/2017 09/29/2016 09/28/2016 09/26/2016 09/26/2016 75/10/6999  Systolic BP 749 449 675 916 384 665 993  Diastolic BP 570 90 82 88 177 87 80  Wt. (Lbs) 222 220 207 206 209 203 208.5  BMI 39.33 38.97 36.67 36.49 35.87 35.96 35.79

## 2017-02-08 ENCOUNTER — Telehealth: Payer: Self-pay

## 2017-02-08 ENCOUNTER — Ambulatory Visit (INDEPENDENT_AMBULATORY_CARE_PROVIDER_SITE_OTHER): Payer: 59 | Admitting: Family Medicine

## 2017-02-08 ENCOUNTER — Encounter: Payer: Self-pay | Admitting: Family Medicine

## 2017-02-08 VITALS — BP 128/78 | HR 76 | Temp 98.1°F | Resp 16 | Ht 63.0 in | Wt 222.0 lb

## 2017-02-08 DIAGNOSIS — I10 Essential (primary) hypertension: Secondary | ICD-10-CM

## 2017-02-08 DIAGNOSIS — Z6839 Body mass index (BMI) 39.0-39.9, adult: Secondary | ICD-10-CM | POA: Diagnosis not present

## 2017-02-08 DIAGNOSIS — R7303 Prediabetes: Secondary | ICD-10-CM

## 2017-02-08 LAB — HEMOGLOBIN A1C
HEMOGLOBIN A1C: 5.6 % (ref <5.7)
Mean Plasma Glucose: 114 mg/dL

## 2017-02-08 LAB — COMPREHENSIVE METABOLIC PANEL
ALT: 11 U/L (ref 6–29)
AST: 19 U/L (ref 10–30)
Albumin: 3.6 g/dL (ref 3.6–5.1)
Alkaline Phosphatase: 105 U/L (ref 33–115)
BUN: 13 mg/dL (ref 7–25)
CHLORIDE: 104 mmol/L (ref 98–110)
CO2: 27 mmol/L (ref 20–31)
Calcium: 8.6 mg/dL (ref 8.6–10.2)
Creat: 0.67 mg/dL (ref 0.50–1.10)
Glucose, Bld: 98 mg/dL (ref 65–99)
Potassium: 3.8 mmol/L (ref 3.5–5.3)
SODIUM: 139 mmol/L (ref 135–146)
Total Bilirubin: 0.1 mg/dL — ABNORMAL LOW (ref 0.2–1.2)
Total Protein: 6.7 g/dL (ref 6.1–8.1)

## 2017-02-08 MED ORDER — HYDROCHLOROTHIAZIDE 12.5 MG PO CAPS: 12.5000 mg | ORAL_CAPSULE | Freq: Every day | ORAL | 1 refills | Status: DC

## 2017-02-08 NOTE — Patient Instructions (Signed)
f/u in 4 months, call if you need me before  Continue medication you are taking  It is important that you exercise regularly at least 30 minutes 5 times a week. If you develop chest pain, have severe difficulty breathing, or feel very tired, stop exercising immediately and seek medical attention  Thankful you are feeling better

## 2017-02-08 NOTE — Telephone Encounter (Signed)
I called pt today to follow up on her experience in our office.  She told me she did not receive a follow up call about her med questions and when she called back she was told someone would call her back.  She did not receive a call.  She came in and had a visit with Dr Moshe Cipro.  She has a HBP issue and needed her meds addressed as it reflects her HBP.  I gave he my name and told her she could call me directly at any time if she is having any problems in our office.   She was thankful for my call.

## 2017-02-10 ENCOUNTER — Encounter: Payer: Self-pay | Admitting: Family Medicine

## 2017-02-10 NOTE — Assessment & Plan Note (Signed)
Deteriorated. Patient re-educated about  the importance of commitment to a  minimum of 150 minutes of exercise per week.  The importance of healthy food choices with portion control discussed. Encouraged to start a food diary, count calories and to consider  joining a support group. Sample diet sheets offered. Goals set by the patient for the next several months.   Weight /BMI 02/08/2017 01/31/2017 01/24/2017  WEIGHT 222 lb 0.6 oz 222 lb 220 lb  HEIGHT 5\' 3"  5\' 3"  5\' 3"   BMI 39.33 kg/m2 39.33 kg/m2 38.97 kg/m2  Upcoming endo re eval

## 2017-02-10 NOTE — Assessment & Plan Note (Signed)
Controlled, no change in medication DASH diet and commitment to daily physical activity for a minimum of 30 minutes discussed and encouraged, as a part of hypertension management. The importance of attaining a healthy weight is also discussed.  BP/Weight 02/08/2017 01/31/2017 01/24/2017 09/29/2016 09/28/2016 09/26/2016 53/29/9242  Systolic BP 683 419 622 297 989 211 941  Diastolic BP 78 740 90 82 88 100 87  Wt. (Lbs) 222.04 222 220 207 206 209 203  BMI 39.33 39.33 38.97 36.67 36.49 35.87 35.96

## 2017-02-10 NOTE — Progress Notes (Signed)
Janet [name]     MRN: [id number]      DOB: 04-26-1986   HPI Ms. Bow is here for follow up and re-evaluation of hypertension. Denies adverse s/e from medication, unfortunately was unable to get methyldopa, actually is taking HCTZ 12.5 mg which I have verified is safe with breast feeding Denies headache Still gaining weight, has endo appt in next 2 weeks Sore throat and laryngitis have also resolved Also hear for lab review ROS Denies recent fever or chills. Denies sinus pressure, nasal congestion, ear pain or sore throat. Denies chest congestion, productive cough or wheezing. Denies chest pains, palpitations and leg swelling Denies abdominal pain, nausea, vomiting,diarrhea or constipation.   Denies dysuria, frequency, hesitancy or incontinence. Denies joint pain, swelling and limitation in mobility. Denies headaches, seizures, numbness, or tingling. Denies depression, anxiety or insomnia. Denies skin break down or rash.   PE  BP 128/78 (BP Location: Right Arm, Patient Position: Sitting, Cuff Size: Normal)   Pulse 76   Temp 98.1 F (36.7 C) (Temporal)   Resp 16   Ht 5\' 3"  (1.6 m)   Wt 222 lb 0.6 oz (100.7 kg)   SpO2 98%   BMI 39.33 kg/m   Patient alert and oriented and in no cardiopulmonary distress.  HEENT: No facial asymmetry, EOMI,   oropharynx pink and moist.  Neck supple no JVD, no mass.  Chest: Clear to auscultation bilaterally.  CVS: S1, S2 no murmurs, no S3.Regular rate.  ABD: Soft non tender.   Ext: No edema    Assessment & Plan  Essential hypertension Controlled, no change in medication DASH diet and commitment to daily physical activity for a minimum of 30 minutes discussed and encouraged, as a part of hypertension management. The importance of attaining a healthy weight is also discussed.  BP/Weight 02/08/2017 01/31/2017 01/24/2017 09/29/2016 09/28/2016 09/26/2016 54/00/8676  Systolic BP 195 093 267 124 580 998 338  Diastolic BP 78 250 90 82 88  100 87  Wt. (Lbs) 222.04 222 220 207 206 209 203  BMI 39.33 39.33 38.97 36.67 36.49 35.87 35.96       Borderline diabetes Resolved Patient educated about the importance of limiting  Carbohydrate intake , the need to commit to daily physical activity for a minimum of 30 minutes , and to commit weight loss. The fact that changes in all these areas will reduce or eliminate all together the development of diabetes is stressed.   Diabetic Labs Latest Ref Rng & Units 02/07/2017 09/13/2016 07/30/2016 01/18/2016 09/18/2015  HbA1c <5.7 % 5.6 - - 5.7(H) -  Chol 100 - 199 mg/dL - - - - 159  HDL >39 mg/dL - - - - 50  Calc LDL 0 - 99 mg/dL - - - - 97  Triglycerides 0 - 149 mg/dL - - - - 59  Creatinine 0.50 - 1.10 mg/dL 0.67 0.70 0.54 - 0.69   BP/Weight 02/08/2017 01/31/2017 01/24/2017 09/29/2016 09/28/2016 09/26/2016 53/97/6734  Systolic BP 193 790 240 973 532 992 426  Diastolic BP 78 834 90 82 88 100 87  Wt. (Lbs) 222.04 222 220 207 206 209 203  BMI 39.33 39.33 38.97 36.67 36.49 35.87 35.96   No flowsheet data found.    Obesity Deteriorated. Patient re-educated about  the importance of commitment to a  minimum of 150 minutes of exercise per week.  The importance of healthy food choices with portion control discussed. Encouraged to start a food diary, count calories and to consider  joining a  support group. Sample diet sheets offered. Goals set by the patient for the next several months.   Weight /BMI 02/08/2017 01/31/2017 01/24/2017  WEIGHT 222 lb 0.6 oz 222 lb 220 lb  HEIGHT 5\' 3"  5\' 3"  5\' 3"   BMI 39.33 kg/m2 39.33 kg/m2 38.97 kg/m2  Upcoming endo re eval

## 2017-02-10 NOTE — Assessment & Plan Note (Signed)
Resolved Patient educated about the importance of limiting  Carbohydrate intake , the need to commit to daily physical activity for a minimum of 30 minutes , and to commit weight loss. The fact that changes in all these areas will reduce or eliminate all together the development of diabetes is stressed.   Diabetic Labs Latest Ref Rng & Units 02/07/2017 09/13/2016 07/30/2016 01/18/2016 09/18/2015  HbA1c <5.7 % 5.6 - - 5.7(H) -  Chol 100 - 199 mg/dL - - - - 159  HDL >39 mg/dL - - - - 50  Calc LDL 0 - 99 mg/dL - - - - 97  Triglycerides 0 - 149 mg/dL - - - - 59  Creatinine 0.50 - 1.10 mg/dL 0.67 0.70 0.54 - 0.69   BP/Weight 02/08/2017 01/31/2017 01/24/2017 09/29/2016 09/28/2016 09/26/2016 07/62/2633  Systolic BP 354 562 563 893 734 287 681  Diastolic BP 78 157 90 82 88 100 87  Wt. (Lbs) 222.04 222 220 207 206 209 203  BMI 39.33 39.33 38.97 36.67 36.49 35.87 35.96   No flowsheet data found.

## 2017-02-24 ENCOUNTER — Ambulatory Visit (INDEPENDENT_AMBULATORY_CARE_PROVIDER_SITE_OTHER): Payer: 59 | Admitting: Endocrinology

## 2017-02-24 ENCOUNTER — Encounter: Payer: Self-pay | Admitting: Endocrinology

## 2017-02-24 VITALS — BP 132/80 | HR 81 | Wt 224.8 lb

## 2017-02-24 DIAGNOSIS — E249 Cushing's syndrome, unspecified: Secondary | ICD-10-CM | POA: Diagnosis not present

## 2017-02-24 MED ORDER — DEXAMETHASONE 1 MG PO TABS: 1.0000 mg | ORAL_TABLET | Freq: Once | ORAL | 0 refills | Status: AC

## 2017-02-24 NOTE — Patient Instructions (Signed)
Let's check a 24-HR urine test.  Also, You should do a "dexamethasone suppression test."  For this, you would take dexamethasone 1 mg at 10 pm (I have sent a prescription to your pharmacy), then come in for a "cortisol" blood test the next morning before 9 am.  You do not need to be fasting for this test.

## 2017-02-24 NOTE — Progress Notes (Signed)
Subjective:    Patient ID: Janet [name], female    DOB: 01/06/1986, 31 y.o.   MRN: [id number]  HPI  Pt is referred by Dr Moshe Cipro, for adrenal evaluation.  In 2012, pt had left adrenalectomy at [facility] for left adrenal adenoma causing cushing's syndrome.  She was advised not to undergo right adrenalectomy.  She has not recently taken any steroids.  She has no h/o pituitary disorder, skin ulcers, cataracts, PUD, osteoporosis, DM, infection, or bony fracture.  She has slight striae at the upper arms, and assoc easy bruising.  She has also developed HTN.  Menses have not resumed after giving birth.  Past Medical History:  Diagnosis Date  . 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 Carpal tunnel syndrome during pregnancy   . 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 Cushing syndrome ([place]) 2011   r/t adrenal tumor; tumor removed, disease resolved  . Cushing's syndrome ([place]) 11/27/2013  . Gastroesophageal reflux disease   . Hematuria 09/18/2015  . Menstrual periods irregular 09/18/2015  . Migraine without aura, with intractable migraine, so stated, without mention of status migrainosus 11/11/2013  . Patient desires pregnancy 11/27/2013  . Pregnant 12/16/2015  . Vitamin D deficiency     Past Surgical History:  Procedure Laterality Date  . CHOLECYSTECTOMY  02/2015  . COLONOSCOPY N/A 04/19/2013   [facility]:ALPFXT mucosa in the terminal ileum/Single erosion in transverse colon/RECTAL BLEEDING DUE TO Moderate sized internal hemorrhoids/ trv colon erosion, bx benign.  . ESOPHAGOGASTRODUODENOSCOPY N/A 04/19/2013   [facility]:OXBD Non-erosive gastritis/PERI-UMBILICAL PAIN DUE TO GERD, GASTRITIS, AND CONSTIPATION. Bx with mild chronic inactive gastritis. No H.Pylori  . HEMORRHOID BANDING  2015   Dr. Gala Romney- in office banding procedure  . TONSILLECTOMY    . tumor removed left adrenal gland 01/12      Social History   Social History  . Marital status: Single    Spouse name: N/A  .  Number of children: 1  . Years of education: BA   Occupational History  .  [employer]   Social History Main Topics  . Smoking status: Never Smoker  . Smokeless tobacco: Never Used  . Alcohol use No  . Drug use: No  . Sexual activity: Not Currently    Birth control/ protection: None   Other Topics Concern  . Not on file   Social History Narrative   Denies caffeine use     Current Outpatient Prescriptions on File Prior to Visit  Medication Sig Dispense Refill  . hydrochlorothiazide (MICROZIDE) 12.5 MG capsule Take 1 capsule (12.5 mg total) by mouth daily. 90 capsule 1  . Prenatal Vit-Fe Fumarate-FA (PRENATAL VITAMINS PLUS PO) Take by mouth.     No current facility-administered medications on file prior to visit.     Allergies  Allergen Reactions  . Methylprednisolone Shortness Of Breath, Itching and Other (See Comments)    Reaction:  Bruising   . Penicillins Anaphylaxis, Rash and Other (See Comments)    Has patient had a PCN reaction causing immediate rash, facial/tongue/throat swelling, SOB or lightheadedness with hypotension: No Has patient had a PCN reaction causing severe rash involving mucus membranes or skin necrosis: No Has patient had a PCN reaction that required hospitalization No Has patient had a PCN reaction occurring within the last 10 years: No If all of the above answers are "NO", then may proceed with Cephalosporin use.  . Latex Rash    Family History  Problem Relation  Age of Onset  . Hypertension Mother   . Hyperlipidemia Mother   . Hypertension Father   . Hypertension Maternal Grandmother   . Diabetes Maternal Grandmother   . Diabetes Paternal Grandmother   . COPD Paternal Grandfather   . Heart disease Paternal Grandfather   . Colon cancer Other        maternal great uncle  . Stroke Maternal Aunt   . Migraines Maternal Aunt     BP 132/80 (BP Location: Left Arm, Patient Position: Sitting)   Pulse 81   Wt 224 lb 12.8 oz (102 kg)   SpO2 97%    BMI 39.82 kg/m   Review of Systems denies blurry vision, polyuria, sob, insomnia, cramps, numbness, muscle weakness, depression, and rash.  She has intermitt headache, dry skin, easy bruising, slight overall darkening of the skin, and terminal hair growth on the face.      Objective:   Physical Exam VS: see vs page GEN: no distress.  Morbid obesity HEAD: head: no deformity eyes: no periorbital swelling, no proptosis.  external nose and ears are normal mouth: no lesion seen NECK: supple, thyroid is not enlarged.  CHEST WALL: no deformity LUNGS: clear to auscultation CV: reg rate and rhythm, no murmur ABD: abdomen is soft, nontender.  no hepatosplenomegaly.  not distended.  no hernia MUSCULOSKELETAL: muscle bulk and strength are grossly normal.  no obvious joint swelling.  gait is normal and steady EXTEMITIES: no deformity.  no ulcer on the feet.  feet are of normal color and temp.  no edema.   PULSES: dorsalis pedis intact bilat.  no carotid bruit NEURO:  cn 2-12 grossly intact.   readily moves all 4's.  sensation is intact to touch on the feet SKIN:  Normal texture and temperature.  No rash or suspicious lesion is visible.  Terminal hair is noted on the face. No striae.   NODES:  None palpable at the neck PSYCH: alert, well-oriented.  Does not appear anxious nor depressed.   Lab Results  Component Value Date   TSH 1.420 09/13/2016   I have reviewed outside records, and summarized: Pt was noted to have adrenal hx, and referred here.  Main problem addressed was HTN, and it was well-controlled.     Assessment & Plan:  H/o cortisol-producing adrenal adenoma, due for recheck.   HTN: new to me: well-controlled.  Please continue the same medication.  Patient Instructions  Let's check a 24-HR urine test.  Also, You should do a "dexamethasone suppression test."  For this, you would take dexamethasone 1 mg at 10 pm (I have sent a prescription to your pharmacy), then come in for a  "cortisol" blood test the next morning before 9 am.  You do not need to be fasting for this test.

## 2017-02-27 ENCOUNTER — Other Ambulatory Visit (INDEPENDENT_AMBULATORY_CARE_PROVIDER_SITE_OTHER): Payer: 59

## 2017-02-27 DIAGNOSIS — E249 Cushing's syndrome, unspecified: Secondary | ICD-10-CM | POA: Diagnosis not present

## 2017-02-27 LAB — CORTISOL: Cortisol, Plasma: 0.2 ug/dL

## 2017-03-02 ENCOUNTER — Ambulatory Visit (INDEPENDENT_AMBULATORY_CARE_PROVIDER_SITE_OTHER): Payer: 59 | Admitting: Neurology

## 2017-03-02 ENCOUNTER — Encounter: Payer: Self-pay | Admitting: Neurology

## 2017-03-02 VITALS — BP 126/71 | HR 67 | Ht 63.0 in | Wt 224.0 lb

## 2017-03-02 DIAGNOSIS — G935 Compression of brain: Secondary | ICD-10-CM

## 2017-03-02 DIAGNOSIS — G43009 Migraine without aura, not intractable, without status migrainosus: Secondary | ICD-10-CM | POA: Diagnosis not present

## 2017-03-02 LAB — CORTISOL, URINE, 24 HOUR
Cortisol (Ur), Free: 14.3 mcg/24 h (ref 4.0–50.0)
RESULTS RECEIVED: 1.56 g/(24.h) (ref 0.63–2.50)

## 2017-03-02 MED ORDER — SUMATRIPTAN SUCCINATE 100 MG PO TABS: 100.0000 mg | ORAL_TABLET | Freq: Two times a day (BID) | ORAL | 5 refills | Status: DC | PRN

## 2017-03-02 MED ORDER — AMITRIPTYLINE HCL 10 MG PO TABS: ORAL_TABLET | ORAL | 3 refills | Status: DC

## 2017-03-02 NOTE — Patient Instructions (Signed)
   We will start amitriptyline for the headache and use imitrex if needed for the headache.  Elavil (amitriptyline) is an antidepressant medication that has many uses that may include headache, whiplash injuries, or for peripheral neuropathy pain. Side effects may include drowsiness, dry mouth, blurred vision, or constipation. As with any antidepressant medication, worsening depression may occur. If you had any significant side effects, please call our office. The full effects of this medication may take 7-10 days after starting the drug, or going up on the dose.

## 2017-03-02 NOTE — Progress Notes (Signed)
Reason for visit: Migraine headache  Janet [name] is an 31 y.o. female  History of present illness:  Janet [name] is a 31 year old right-handed black female with a history of migraine headache. The patient has gone back to work in her headaches have become more frequent. Following delivery there were occurring once every 2 weeks or so, but now they are occurring 2 or 3 times a week. The patient is not taking any medication for the headache. The patient also has Arnold-Chiari malformation, she has traction type headaches that occur with straining with bowel movement or with stooping or bending. The patient has not had any visual loss, double vision, or syncope. MRI of the brain has been done showing stability of the Chiari malformation. The patient is having daily brief such headaches. The patient returns to office today for an evaluation.  Past Medical History:  Diagnosis Date  . 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 Carpal tunnel syndrome during pregnancy   . 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 Gastroesophageal reflux disease   . Hematuria 09/18/2015  . Menstrual periods irregular 09/18/2015  . Migraine without aura, with intractable migraine, so stated, without mention of status migrainosus 11/11/2013  . Patient desires pregnancy 11/27/2013  . Pregnant 12/16/2015  . Vitamin D deficiency     Past Surgical History:  Procedure Laterality Date  . CHOLECYSTECTOMY  02/2015  . COLONOSCOPY N/A 04/19/2013   [id number] mucosa in the terminal ileum/Single erosion in transverse colon/RECTAL BLEEDING DUE TO Moderate sized internal hemorrhoids/ trv colon erosion, bx benign.  . ESOPHAGOGASTRODUODENOSCOPY N/A 04/19/2013   UTM:LYYT Non-erosive gastritis/PERI-UMBILICAL PAIN DUE TO GERD, GASTRITIS, AND CONSTIPATION. Bx with mild chronic inactive  gastritis. No H.Pylori  . HEMORRHOID BANDING  2015   Dr. Gala Romney- in office banding procedure  . TONSILLECTOMY    . 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 Hypertension Mother   . Hyperlipidemia Mother   . Hypertension Father   . Hypertension Maternal Grandmother   . Diabetes Maternal Grandmother   . Diabetes Paternal Grandmother   . COPD Paternal Grandfather   . Heart disease Paternal Grandfather   . Colon cancer Other        maternal great uncle  . Stroke Maternal Aunt   . Migraines Maternal Aunt     Social history:  reports that she has never smoked. She has never used smokeless tobacco. She reports that she does not drink alcohol or use drugs.    Allergies  Allergen Reactions  . Methylprednisolone Shortness Of Breath, Itching and Other (See Comments)    Reaction:  Bruising   . Penicillins Anaphylaxis, Rash and Other (See Comments)    Has patient had a PCN reaction causing immediate rash, facial/tongue/throat swelling, SOB or lightheadedness with hypotension: No Has patient had a PCN reaction causing severe rash involving mucus membranes or skin necrosis: No Has patient had a PCN reaction that required hospitalization No Has patient had a PCN reaction occurring within the last 10 years: No If all of the above answers are "NO", then may proceed with Cephalosporin use.  . Latex Rash    Medications:  Prior to Admission medications   Medication Sig Start Date End Date Taking? Authorizing Provider  pantoprazole (  PROTONIX) 40 MG tablet  02/16/17  Yes [provider]  Prenatal Vit-Fe Fumarate-FA (PRENATAL VITAMINS PLUS PO) Take by mouth.   Yes [provider]  hydrochlorothiazide (MICROZIDE) 12.5 MG capsule Take 1 capsule (12.5 mg total) by mouth daily. Patient not taking: Reported on 03/02/2017 02/08/17   [name], MD    ROS:  Out of a complete 14 system review of symptoms, the patient complains  only of the following symptoms, and all other reviewed systems are negative.  Headache  Blood pressure 120/80, height 5\' 3"  (1.6 m), weight 224 lb (101.6 kg), currently breastfeeding.  Physical Exam  General: The patient is alert and cooperative at the time of the examination. The patient is moderately to markedly obese.  Skin: No significant peripheral edema is noted.   Neurologic Exam  Mental status: The patient is alert and oriented x 3 at the time of the examination. The patient has apparent normal recent and remote memory, with an apparently normal attention span and concentration ability.   Cranial nerves: Facial symmetry is present. Speech is normal, no aphasia or dysarthria is noted. Extraocular movements are full. Visual fields are full.  Motor: The patient has good strength in all 4 extremities.  Sensory examination: Soft touch sensation is symmetric on the face, arms, and legs.  Coordination: The patient has good finger-nose-finger and heel-to-shin bilaterally.  Gait and station: The patient has a normal gait. Tandem gait is normal. Romberg is negative. No drift is seen.  Reflexes: Deep tendon reflexes are symmetric.   Assessment/Plan:  1. Arnold-Chiari malformation  2. Migraine headache  The patient is breast-feeding, we will start amitriptyline for the migraine and give her prescription for Imitrex. Both of these medications are safe during lactation. The patient will call for any dose adjustment of amitriptyline. She will follow-up in 4 months.  Jill Alexanders MD 03/02/2017 3:42 PM  [facility] [address] [place], [place] [address]  Phone [phone] Fax [phone]

## 2017-03-27 ENCOUNTER — Encounter: Payer: Self-pay | Admitting: Women's Health

## 2017-04-28 NOTE — Telephone Encounter (Signed)
error 

## 2017-05-08 ENCOUNTER — Other Ambulatory Visit: Payer: Self-pay | Admitting: Obstetrics and Gynecology

## 2017-05-08 NOTE — Telephone Encounter (Signed)
refil pantoprazole x 90 x 3

## 2017-06-14 ENCOUNTER — Encounter: Payer: Self-pay | Admitting: Family Medicine

## 2017-06-14 ENCOUNTER — Ambulatory Visit: Payer: 59 | Admitting: Family Medicine

## 2017-06-14 ENCOUNTER — Ambulatory Visit: Payer: Self-pay | Admitting: Gastroenterology

## 2017-06-14 ENCOUNTER — Telehealth: Payer: Self-pay | Admitting: Family Medicine

## 2017-06-14 NOTE — Telephone Encounter (Signed)
Patient came to my window to check in, while looking for her name I noticed her appt was at 1:40, at this time it was 1:58. I told her I would need to ask a nurse if she can still be seen because we have a policy against late patients after 10 min. Because the doctor runs behind. She stated that jamie told her she could be late ( jamie is not in our office today ) I told her there was no jamie in the office and she said " oh well, dr. Moshe Cipro was mad last time because of the way I was treated." I said I apologize but I still need to ask the nurse. Bubba Hales said the patient would have a wait because the patient before her was also late, I told the patient that she would have a wait due to the schedule and she said that she doesn't know why she has to wait because she was here on time (her appt was 1:40, it was 1:58 when she arrived ) then she said she was late because she didn't want to bring her baby to a doctors office. She then said that the office manager told her the last time she was here to ask for her (the office manager) if she ever had any problems in the office, so I got marion for her.

## 2017-06-20 ENCOUNTER — Telehealth: Payer: Self-pay

## 2017-06-20 NOTE — Telephone Encounter (Signed)
I talked with Janet [name] this am and discussed her disappointment with our office at her last appointment.  She said she was told it would be ok to be a little late and that we did not have any sick patients in the office that day.  I think our office had a misunderstanding about sick patients in the office and how late she could be.  I told her I was sorry for her appointment being re-scheduled and I would do anything I can to make this wright for her.  She did not like that when she left I had her leave by way of the side door.  I dicussed with her I was told she was upset that she had her little baby in the office with potential sick patients that is why I did that.  I asked again to please let me know of anything I can do to make this better.  I told her I would talk with the staff and I have done so this pm. I also told her our goal is always 5 star customer service.

## 2017-06-21 NOTE — Telephone Encounter (Signed)
Noted, thanks you

## 2017-07-26 ENCOUNTER — Ambulatory Visit: Payer: 59 | Admitting: Gastroenterology

## 2017-08-04 ENCOUNTER — Ambulatory Visit: Payer: Self-pay | Admitting: Gastroenterology

## 2017-09-21 ENCOUNTER — Ambulatory Visit: Payer: 59 | Admitting: Women's Health

## 2017-09-21 ENCOUNTER — Encounter: Payer: Self-pay | Admitting: Women's Health

## 2017-09-21 VITALS — BP 120/80 | HR 95 | Ht 64.0 in | Wt 221.2 lb

## 2017-09-21 DIAGNOSIS — N644 Mastodynia: Secondary | ICD-10-CM

## 2017-09-21 DIAGNOSIS — Z30011 Encounter for initial prescription of contraceptive pills: Secondary | ICD-10-CM

## 2017-09-21 MED ORDER — NORETHINDRONE 0.35 MG PO TABS: 1.0000 | ORAL_TABLET | Freq: Every day | ORAL | 11 refills | Status: DC

## 2017-09-21 NOTE — Patient Instructions (Addendum)
Breast ultrasound and mammogram at [facility], no lotion/deoderant/powder/perfume that day  I will call you with date/time

## 2017-09-21 NOTE — Progress Notes (Signed)
   GYN VISIT Patient name: Janet [name] MRN [id number]  Date of birth: November 12, 1985 Chief Complaint:   check right breast (painful and tender)  History of Present Illness:   Janet [name] is a 31 y.o. G69P1001 African American female being seen today for report of Rt breast pain that began last week.Last week was really bad, had sharp shooting pains and tenderness, getting better- now only hurts/painful to touch. Denies recent trauma. Stopped breastfeeding 1 month ago, but hasn't fed/pumped from Rt breast in over 5 months. No family h/o breast cancer she is aware of. Hasn't felt any lumps/bumps, just feels different.     Also wants to start birth control, getting ready to start trying to lose weight, and she got pregnant when she lost weight in past. Does have migraines- does see squiggly lines sometimes.   Patient's last menstrual period was 09/06/2017. The current method of family planning is none. Last pap 06/26/15. Results were:  neg w/ -HRHPV Review of Systems:   Pertinent items are noted in HPI Denies fever/chills, dizziness, headaches, visual disturbances, fatigue, shortness of breath, chest pain, abdominal pain, vomiting, abnormal vaginal discharge/itching/odor/irritation, problems with periods, bowel movements, urination, or intercourse unless otherwise stated above.  Pertinent History Reviewed:  Reviewed past medical,surgical, social, obstetrical and family history.  Reviewed problem list, medications and allergies. Physical Assessment:   Vitals:   09/21/17 1527  BP: 120/80  Pulse: 95  Weight: 221 lb 3.2 oz (100.3 kg)  Height: 5\' 4"  (1.626 m)  Body mass index is 37.97 kg/m.       Physical Examination:   General appearance: alert, well appearing, and in no distress  Mental status: alert, oriented to person, place, and time  Skin: warm & dry   Cardiovascular: normal heart rate noted  Respiratory: normal respiratory effort, no distress  Breasts - Lt breast appears normal-  no lumps/abnormalities felt. Rt breast has hyperpigmented area of skin at app 3 o'clock few fb from nipple- this is the area of pt's pain. No lumps/abnormalities felt. Does also have tenderness below the nipple at 6 o'clock.   Abdomen: soft, non-tender   Pelvic: examination not indicated  Extremities: no edema   No results found for this or any previous visit (from the past 24 hour(s)).  Assessment & Plan:  1) Rt breast pain> will get breast u/s and mammogram. Unable to contact radiology to schedule, will continue trying and call pt w/ date/time. No lotion/powder/deoderant/perfume that day.   2) Contraception management> Rx micronor w/ 11RF (d/t migraines w/ possible aura), understands has to take at exact same time daily to be effective, if late taking use condom as back-up   Orders Placed This Encounter  Procedures  . MM DIAG BREAST TOMO BILATERAL  . US BREAST LTD UNI LEFT INC AXILLA  . US BREAST LTD UNI RIGHT INC AXILLA    Return in about 1 month (around 10/22/2017) for Physical.  Tawnya Crook CNM, [facility] 09/21/2017 4:34 PM

## 2017-10-26 ENCOUNTER — Encounter: Payer: Self-pay | Admitting: Women's Health

## 2017-10-26 ENCOUNTER — Ambulatory Visit (INDEPENDENT_AMBULATORY_CARE_PROVIDER_SITE_OTHER): Payer: 59 | Admitting: Women's Health

## 2017-10-26 ENCOUNTER — Other Ambulatory Visit (HOSPITAL_COMMUNITY)
Admission: RE | Admit: 2017-10-26 | Discharge: 2017-10-26 | Disposition: A | Discharge status: Home / Self Care | Payer: 59 | Source: Ambulatory Visit | Attending: Obstetrics & Gynecology | Admitting: Obstetrics & Gynecology

## 2017-10-26 VITALS — BP 124/70 | Ht 64.0 in | Wt 219.7 lb

## 2017-10-26 DIAGNOSIS — Z01419 Encounter for gynecological examination (general) (routine) without abnormal findings: Secondary | ICD-10-CM

## 2017-10-26 DIAGNOSIS — K5900 Constipation, unspecified: Secondary | ICD-10-CM | POA: Diagnosis not present

## 2017-10-26 DIAGNOSIS — Z01411 Encounter for gynecological examination (general) (routine) with abnormal findings: Secondary | ICD-10-CM

## 2017-10-26 DIAGNOSIS — N644 Mastodynia: Secondary | ICD-10-CM | POA: Diagnosis not present

## 2017-10-26 NOTE — Progress Notes (Signed)
   WELL-WOMAN EXAMINATION Patient name: Janet [name] MRN [id number]  Date of birth: 1985-11-24 Chief Complaint:   Gynecologic Exam  History of Present Illness:   Janet [name] is a 32 y.o. G47P1001 African American female being seen today for a routine well-woman exam.  Current complaints: continued intermittent Rt breast pain, hasn't gotten mammo, u/s. Was seen 12/13 for same. States it went away for a little bit, but came back this past Monday and hurt badly, now hurts, but not as bad. Does still want to get mammo, u/s.  Just recently started a weight loss program through bariatric clinic. Not currently having sex. Some mild constipation.   PCP: Dr. Moshe Cipro      does not desire labs, will get w/ PCP No LMP recorded. The current method of family planning is has rx for micronor, hasn't started yet.  Last pap 06/26/15. Results were: neg w/ -HRHPV Last mammogram: never. Results were: n/a Last colonoscopy: never. Results were: n/a  Review of Systems:   Pertinent items are noted in HPI Denies any headaches, blurred vision, fatigue, shortness of breath, chest pain, abdominal pain, abnormal vaginal discharge/itching/odor/irritation, problems with periods, bowel movements, urination, or intercourse unless otherwise stated above. Pertinent History Reviewed:  Reviewed past medical,surgical, social and family history.  Reviewed problem list, medications and allergies. Physical Assessment:   Vitals:   10/26/17 1557  BP: 124/70  Weight: 219 lb 11.2 oz (99.7 kg)  Height: 5\' 4"  (1.626 m)  Body mass index is 37.71 kg/m.        Physical Examination:   General appearance - well appearing, and in no distress  Mental status - alert, oriented to person, place, and time  Psych:  She has a normal mood and affect  Skin - warm and dry, normal color, no suspicious lesions noted  Chest - effort normal, all lung fields clear to auscultation bilaterally  Heart - normal rate and regular rhythm  Neck:   midline trachea, no thyromegaly or nodules  Breasts - breasts appear normal, no suspicious masses, no skin or nipple changes or axillary nodes. I no longer see the hyperpigmented area I saw on 12/13. Does have tenderness at app 3 o'clock few fb from nipple.   Abdomen - soft, nontender, nondistended, no masses or organomegaly  Pelvic - VULVA: normal appearing vulva with no masses, tenderness or lesions  VAGINA: normal appearing vagina with normal color and discharge, no lesions  CERVIX: normal appearing cervix without discharge or lesions, no CMT  Thin prep pap is done w/ HR HPV cotesting  UTERUS: uterus is felt to be normal size, shape, consistency and nontender   ADNEXA: No adnexal masses or tenderness noted.  Extremities:  No swelling or varicosities noted  No results found for this or any previous visit (from the past 24 hour(s)).  Assessment & Plan:  1) Well-Woman Exam  2) Rt breast pain> mammo & breast u/s scheduled at [facility] for 1/29 @ 1:10pm, be there at 12:55pm, no lotion/powder/deoderant/perfume that day  3) Mild constipation> gave printed prevention/relief measures   Labs/procedures today: pap  Mammogram-as scheduled  Colonoscopy @ 32yo, or sooner if problems  No orders of the defined types were placed in this encounter.   Follow-up: Return in about 1 year (around 10/26/2018) for Physical.  Tawnya Crook CNM, [facility] 10/26/2017 4:41 PM

## 2017-10-26 NOTE — Patient Instructions (Addendum)
Breast ultrasound and mammogram 1/29 @ 1:10pm at [facility], be there at 12:55pm, no lotion/deoderant/powder/perfume that day   Constipation  Drink plenty of fluid, preferably water, throughout the day  Eat foods high in fiber such as fruits, vegetables, and grains  Exercise, such as walking, is a good way to keep your bowels regular  Drink warm fluids, especially warm prune juice, or decaf coffee  Eat a 1/2 cup of real oatmeal (not instant), 1/2 cup applesauce, and 1/2-1 cup warm prune juice every day  If needed, you may take Colace (docusate sodium) stool softener once or twice a day to help keep the stool soft. If you are pregnant, wait until you are out of your first trimester (12-14 weeks of pregnancy)  If you still are having problems with constipation, you may take Miralax once daily as needed to help keep your bowels regular.  If you are pregnant, wait until you are out of your first trimester (12-14 weeks of pregnancy)

## 2017-10-31 LAB — CYTOLOGY - PAP
Chlamydia: NEGATIVE
DIAGNOSIS: NEGATIVE
HPV: NOT DETECTED
Neisseria Gonorrhea: NEGATIVE

## 2017-11-07 ENCOUNTER — Ambulatory Visit (HOSPITAL_COMMUNITY)
Admission: RE | Admit: 2017-11-07 | Discharge: 2017-11-07 | Disposition: A | Discharge status: Home / Self Care | Payer: 59 | Source: Ambulatory Visit | Attending: Women's Health | Admitting: Women's Health

## 2017-11-07 ENCOUNTER — Telehealth: Payer: Self-pay | Admitting: Family Medicine

## 2017-11-07 DIAGNOSIS — E559 Vitamin D deficiency, unspecified: Secondary | ICD-10-CM

## 2017-11-07 DIAGNOSIS — N644 Mastodynia: Secondary | ICD-10-CM

## 2017-11-07 DIAGNOSIS — R7303 Prediabetes: Secondary | ICD-10-CM

## 2017-11-07 DIAGNOSIS — R928 Other abnormal and inconclusive findings on diagnostic imaging of breast: Secondary | ICD-10-CM | POA: Diagnosis not present

## 2017-11-07 DIAGNOSIS — Z6839 Body mass index (BMI) 39.0-39.9, adult: Secondary | ICD-10-CM

## 2017-11-07 DIAGNOSIS — I10 Essential (primary) hypertension: Secondary | ICD-10-CM

## 2017-11-07 DIAGNOSIS — E66812 Obesity, class 2: Secondary | ICD-10-CM

## 2017-11-07 NOTE — Telephone Encounter (Signed)
Please order labs for appt in march

## 2017-11-07 NOTE — Telephone Encounter (Signed)
Labs ordered.

## 2017-11-07 NOTE — Telephone Encounter (Signed)
PLs order cBC, fasting lipid, cmp and eGFR, hBA1C , tSH and vit D, she does have active dx of HTN, vit D def, iGT and obesity

## 2017-11-07 NOTE — Addendum Note (Signed)
Addended by: Merceda Elks on: 11/07/2017 11:57 AM   Modules accepted: Orders

## 2017-11-07 NOTE — Telephone Encounter (Signed)
What do I need to order? I see nothing in the last office note.

## 2017-11-10 ENCOUNTER — Encounter: Payer: Self-pay | Admitting: Women's Health

## 2017-11-13 ENCOUNTER — Telehealth: Payer: Self-pay | Admitting: Women's Health

## 2017-11-13 ENCOUNTER — Encounter: Payer: Self-pay | Admitting: Women's Health

## 2017-11-13 DIAGNOSIS — N644 Mastodynia: Secondary | ICD-10-CM

## 2017-11-13 DIAGNOSIS — N6310 Unspecified lump in the right breast, unspecified quadrant: Secondary | ICD-10-CM

## 2017-11-13 NOTE — Telephone Encounter (Signed)
Discussed results of mammo/sono with Dr. Elonda Husky, recommends referral to Dr. Arnoldo Morale for f/u. Discussed w/ pt. Referral ordered in Epic today.  Roma Schanz, CNM, WHNP-BC 11/13/2017 10:05 AM

## 2017-11-30 ENCOUNTER — Encounter: Payer: Self-pay | Admitting: General Surgery

## 2017-11-30 ENCOUNTER — Ambulatory Visit: Payer: 59 | Admitting: General Surgery

## 2017-11-30 VITALS — BP 149/75 | HR 69 | Temp 98.4°F | Ht 64.0 in | Wt 219.0 lb

## 2017-11-30 DIAGNOSIS — N644 Mastodynia: Secondary | ICD-10-CM | POA: Diagnosis not present

## 2017-11-30 NOTE — Progress Notes (Signed)
Janet [name]; [id number]; [birth year]   HPI Patient is a 32 year old black female who was referred to my care by Dr. Elonda Husky for evaluation and treatment of right breast pain.  The patient states that this occurs along the upper, inner portion of the right breast.  It can be spontaneous in nature.  She denies any association with her menstrual cycle or with movement.  He has been occurring over the past few months.  She currently has no breast pain.  She did undergo mammogram and ultrasound of the right breast which was negative for malignancy.  There was a question of whether there was a lymph node laterally, but it could not be seen by ultrasound.  This was due to her body habitus.  She states she is recently changed bras.  She denies a immediate family history of breast cancer.  She has had no nipple discharge. Past Medical History:  Diagnosis Date  . 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 Carpal tunnel syndrome during pregnancy   . 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 Gastroesophageal reflux disease   . Hematuria 09/18/2015  . Menstrual periods irregular 09/18/2015  . Migraine without aura, with intractable migraine, so stated, without mention of status migrainosus 11/11/2013  . Patient desires pregnancy 11/27/2013  . Pregnant 12/16/2015  . Vitamin D deficiency     Past Surgical History:  Procedure Laterality Date  . CHOLECYSTECTOMY  02/2015  . COLONOSCOPY N/A 04/19/2013   VOZ:DGUYQI mucosa in the terminal ileum/Single erosion in transverse colon/RECTAL BLEEDING DUE TO Moderate sized internal hemorrhoids/ trv colon erosion, bx benign.  . ESOPHAGOGASTRODUODENOSCOPY N/A 04/19/2013   HKV:QQVZ Non-erosive gastritis/PERI-UMBILICAL PAIN DUE TO GERD, GASTRITIS, AND CONSTIPATION. Bx with mild chronic inactive gastritis. No H.Pylori  . HEMORRHOID  BANDING  2015   Dr. Gala Romney- in office banding procedure  . TONSILLECTOMY    . 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 Hypertension Mother   . Hyperlipidemia Mother   . Hypertension Father   . Hypertension Maternal Grandmother   . Diabetes Maternal Grandmother   . Diabetes Paternal Grandmother   . COPD Paternal Grandfather   . Heart disease Paternal Grandfather   . Colon cancer Other        maternal great uncle  . Stroke Maternal Aunt   . Migraines Maternal Aunt     Current Outpatient Medications on File Prior to Visit  Medication Sig Dispense Refill  . pantoprazole (PROTONIX) 40 MG tablet TAKE 1 TABLET BY MOUTH EVERY DAY 90 tablet 3   No current facility-administered medications on file prior to visit.     Allergies  Allergen Reactions  . Methylprednisolone Shortness Of Breath, Itching and Other (See Comments)    Reaction:  Bruising   . Penicillins Anaphylaxis, Rash and Other (See Comments)    Has patient had a PCN reaction causing immediate rash, facial/tongue/throat swelling, SOB or lightheadedness with hypotension: No Has patient had a PCN reaction causing severe rash involving mucus membranes or skin necrosis: No Has patient had a PCN reaction that required hospitalization No Has patient had a PCN reaction occurring within the last 10 years: No If all of the above answers are "NO", then may proceed with Cephalosporin use.  . Latex Rash  Social History   Substance and Sexual Activity  Alcohol Use No    Social History   Tobacco Use  Smoking Status Never Smoker  Smokeless Tobacco Never Used    Review of Systems  Constitutional: Negative.   HENT: Negative.   Eyes: Negative.   Respiratory: Negative.   Cardiovascular: Negative.   Gastrointestinal: Negative.   Genitourinary: Negative.   Musculoskeletal: Negative.   Skin: Negative.   Neurological: Negative.   Endo/Heme/Allergies: Negative.    Psychiatric/Behavioral: Negative.     Objective   Vitals:   11/30/17 1055  BP: (!) 149/75  Pulse: 69  Temp: 98.4 F (36.9 C)    Physical Exam  Constitutional: She is oriented to person, place, and time and well-developed, well-nourished, and in no distress.  HENT:  Head: Normocephalic and atraumatic.  Cardiovascular: Normal rate, regular rhythm and normal heart sounds. Exam reveals no gallop and no friction rub.  No murmur heard. Pulmonary/Chest: Effort normal and breath sounds normal. No respiratory distress. She has no wheezes. She has no rales.  Neurological: She is alert and oriented to person, place, and time.  Skin: Skin is warm and dry.  Vitals reviewed. Breast: No dominant mass, nipple discharge, dimpling in either breast.  No specific point tenderness is noted.  Axillas are negative for palpable nodes.  Mammogram and ultrasound reports reviewed  Assessment  Right breast pain Plan   I told patient that I did not have a specific etiology for her breast pain.  Ibuprofen, vitamin E, primrose oil have been known to help with breast pain.  She should keep her 83-month ultrasound follow-up with radiology as previously scheduled.  Literature was given.  No need for surgical intervention at this time.  Follow-up here as needed.

## 2017-11-30 NOTE — Patient Instructions (Signed)
Vitamin E or Primrose oil may help with breast pain.  Breast Tenderness Breast tenderness is a common problem for women of all ages. Breast tenderness may cause mild discomfort to severe pain. The pain usually comes and goes in association with your menstrual cycle, but it can be constant. Breast tenderness has many possible causes, including hormone changes and some medicines. Your health care provider may order tests, such as a mammogram or an ultrasound, to check for any unusual findings. Having breast tenderness usually does not mean that you have breast cancer. Follow these instructions at home: Sometimes, reassurance that you do not have breast cancer is all that is needed. In general, follow these home care instructions: Managing pain and discomfort  If directed, apply ice to the area: ? Put ice in a plastic bag. ? Place a towel between your skin and the bag. ? Leave the ice on for 20 minutes, 2-3 times a day.  Make sure you are wearing a supportive bra, especially during exercise. You may also want to wear a supportive bra while sleeping if your breasts are very tender. Medicines  Take over-the-counter and prescription medicines only as told by your health care provider. If the cause of your pain is infection, you may be prescribed an antibiotic medicine.  If you were prescribed an antibiotic, take it as told by your health care provider. Do not stop taking the antibiotic even if you start to feel better. General instructions  Your health care provider may recommend that you reduce the amount of fat in your diet. You can do this by: ? Limiting fried foods. ? Cooking foods using methods, such as baking, boiling, grilling, and broiling.  Decrease the amount of caffeine in your diet. You can do this by drinking more water and choosing caffeine-free options.  Keep a log of the days and times when your breasts are most tender.  Ask your health care provider how to do breast exams at  home. This will help you notice if you have an unusual growth or lump. Contact a health care provider if:  Any part of your breast is hard, red, and hot to the touch. This may be a sign of infection.  You are not breastfeeding and you have fluid, especially blood or pus, coming out of your nipples.  You have a fever.  You have a new or painful lump in your breast that remains after your menstrual period ends.  Your pain does not improve or it gets worse.  Your pain is interfering with your daily activities. This information is not intended to replace advice given to you by your health care provider. Make sure you discuss any questions you have with your health care provider. Document Released: 09/08/2008 Document Revised: 06/24/2016 Document Reviewed: 06/24/2016 Elsevier Interactive Patient Education  [employer].

## 2017-12-09 ENCOUNTER — Telehealth: Payer: Self-pay | Admitting: Family Medicine

## 2017-12-09 MED ORDER — OSELTAMIVIR PHOSPHATE 75 MG PO CAPS: 75.0000 mg | ORAL_CAPSULE | Freq: Every day | ORAL | 0 refills | Status: DC

## 2017-12-09 NOTE — Telephone Encounter (Signed)
Talked with on phone on call. Patient's sister diagnosed with the flu. Need px treatment called into pharmacy. Gwen Her. Mannie Stabile, MD

## 2017-12-21 ENCOUNTER — Telehealth: Payer: Self-pay | Admitting: Family Medicine

## 2017-12-21 NOTE — Telephone Encounter (Signed)
When I called the scrape to her leg happened last week so I put her on the schedule for tomorrow

## 2017-12-21 NOTE — Telephone Encounter (Signed)
Benadryl cream to the hives and any topical antibiotic ointment

## 2017-12-21 NOTE — Telephone Encounter (Signed)
Patient called in stating she was having an allergic reaction with hives all over and she wasn't sure what it was from.  After speaking with brandi I advised her to go to the ER. She stated she wasn't having a hard time breathing. I told her that is what is advised. Then she proceeded to tell me the following...  She fell, has a few cuts, she used a bandaid, she says she has a "syndrome" that causes her to have a reaction when using a bandaids so she has hives around the area that she says is an allergic reaction. She wants to know what she can put on the cut and use to the hives. Cb#: [phone]

## 2017-12-22 ENCOUNTER — Encounter: Payer: Self-pay | Admitting: Family Medicine

## 2017-12-22 ENCOUNTER — Ambulatory Visit: Payer: 59 | Admitting: Family Medicine

## 2017-12-22 VITALS — BP 110/70 | HR 73 | Resp 16 | Ht 64.0 in | Wt 218.0 lb

## 2017-12-22 DIAGNOSIS — Z883 Allergy status to other anti-infective agents status: Secondary | ICD-10-CM

## 2017-12-22 DIAGNOSIS — Z881 Allergy status to other antibiotic agents status: Secondary | ICD-10-CM

## 2017-12-22 NOTE — Patient Instructions (Signed)
Stop neosporin  May use bacitracin May use 1% cortisone cream on the itchy rash Take claritin or zyrtec for the itching  Call for problems

## 2017-12-22 NOTE — Progress Notes (Signed)
Chief Complaint  Patient presents with  . Rash    fell last week and scraped her right leg and right arm and then it developed a rash around the area  Usual PCP is Tula Nakayama.  Patient is here today for evaluation of a rash. Patient states that she fell approximately 2 weeks ago.  She was carrying her daughter, and just tripped over a Stage manager.  She scraped her knee and her right arm.  She immediately cleaned it with peroxide and has been using Neosporin and Band-Aids.  She noticed a few days ago that she is developing increasingly itchy rash around the scrapes even though the scrapes themselves appear to be healing.  She has a known latex allergy, and always use Band-Aids that do not have latex.  He has used Neosporin on small wounds for her adult life.  She does not recall ever having reactions from it before.   Patient Active Problem List   Diagnosis Date Noted  . Breast pain, right 11/13/2017  . Essential hypertension 09/26/2016  . Borderline diabetes   . Menstrual periods irregular 09/18/2015  . Internal hemorrhoids with other complication 16/07/9603  . 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 Common migraine 11/11/2013  . Unspecified constipation 08/16/2013  . Bowel habit changes 03/22/2013  . Esophageal dysphagia 03/22/2013  . Vitamin D deficiency 03/06/2013  . GERD (gastroesophageal reflux disease) 03/06/2013  . Obesity 07/07/2010  . SINUS ARRHYTHMIA 07/07/2010  . ANEMIA 06/23/2010    Outpatient Encounter Medications as of 12/22/2017  Medication Sig  . pantoprazole (PROTONIX) 40 MG tablet TAKE 1 TABLET BY MOUTH EVERY DAY  . [DISCONTINUED] oseltamivir (TAMIFLU) 75 MG capsule Take 1 capsule (75 mg total) by mouth daily.   No facility-administered encounter medications on file as of 12/22/2017.     Allergies  Allergen Reactions  . Methylprednisolone Shortness Of Breath, Itching and Other (See Comments)    Reaction:   Bruising   . Penicillins Anaphylaxis, Rash and Other (See Comments)    Has patient had a PCN reaction causing immediate rash, facial/tongue/throat swelling, SOB or lightheadedness with hypotension: No Has patient had a PCN reaction causing severe rash involving mucus membranes or skin necrosis: No Has patient had a PCN reaction that required hospitalization No Has patient had a PCN reaction occurring within the last 10 years: No If all of the above answers are "NO", then may proceed with Cephalosporin use.  . Latex Rash  . Neomycin Rash    Breaks out with neosporin    Review of Systems  Constitutional: Negative for chills and fever.  HENT: Negative for trouble swallowing and voice change.   Respiratory: Negative for cough and wheezing.   Skin: Positive for rash.  All other systems reviewed and are negative.   BP 110/70   Pulse 73   Resp 16   Ht 5\' 4"  (1.626 m)   Wt 218 lb (98.9 kg)   SpO2 96%   BMI 37.42 kg/m   Physical Exam  Constitutional: She is oriented to person, place, and time. She appears well-developed and well-nourished. No distress.  HENT:  Head: Normocephalic and atraumatic.  Mouth/Throat: Oropharynx is clear and moist.  Eyes: Conjunctivae are normal. Pupils are equal, round, and reactive to light.  Cardiovascular: Normal rate, regular rhythm and normal heart sounds.  Pulmonary/Chest: Effort normal and breath sounds normal. She has no wheezes.  Neurological: She is alert and oriented to person, place, and time.  Skin:  Right elbow and forearm with a few superficial abrasions that appear to be healing.  Surrounding the abrasions there is a papular fine erythematous rash.  Similar reaction on the right knee with abrasion lower pole of patella and dermatitis surrounding.  Psychiatric: She has a normal mood and affect. Her behavior is normal.    ASSESSMENT/PLAN:  1. 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

## 2018-01-04 ENCOUNTER — Encounter: Payer: 59 | Admitting: Family Medicine

## 2018-01-11 DIAGNOSIS — R635 Abnormal weight gain: Secondary | ICD-10-CM | POA: Diagnosis not present

## 2018-01-11 DIAGNOSIS — N951 Menopausal and female climacteric states: Secondary | ICD-10-CM | POA: Diagnosis not present

## 2018-01-15 DIAGNOSIS — R7309 Other abnormal glucose: Secondary | ICD-10-CM | POA: Diagnosis not present

## 2018-01-25 ENCOUNTER — Ambulatory Visit (INDEPENDENT_AMBULATORY_CARE_PROVIDER_SITE_OTHER): Payer: 59 | Admitting: Family Medicine

## 2018-01-25 ENCOUNTER — Encounter: Payer: Self-pay | Admitting: Family Medicine

## 2018-01-25 VITALS — BP 130/82 | HR 85 | Resp 16 | Ht 64.0 in | Wt 217.4 lb

## 2018-01-25 DIAGNOSIS — E559 Vitamin D deficiency, unspecified: Secondary | ICD-10-CM | POA: Diagnosis not present

## 2018-01-25 DIAGNOSIS — R7303 Prediabetes: Secondary | ICD-10-CM | POA: Diagnosis not present

## 2018-01-25 DIAGNOSIS — Z0000 Encounter for general adult medical examination without abnormal findings: Secondary | ICD-10-CM

## 2018-01-25 MED ORDER — ERGOCALCIFEROL 1.25 MG (50000 UT) PO CAPS: 50000.0000 [IU] | ORAL_CAPSULE | ORAL | 1 refills | Status: DC

## 2018-01-25 NOTE — Patient Instructions (Signed)
F/u in 5.5 months, call if you need me before  Start once weekly vitamin D , this is sent to [place] D level to be drawn 1 week before follow up visit  Commit to daily exercise of at least 30 minutes and change food choice, also 64 ounces water   I will get back with you on birth control after I hear from Dr Elonda Husky  I believe that you  Will do very well with weight loss  Thank you  for choosing [facility]. We consider it a privelige to serve you.  Delivering excellent health care in a caring and  compassionate way is our goal.  Partnering with you,  so that together we can achieve this goal is our strategy.

## 2018-01-25 NOTE — Progress Notes (Signed)
Janet [name]     MRN: [id number]      DOB: 30-Aug-1986  HPI: Patient is in for annual physical exam. Recent labs,  are reviewed.She is prediabetic wondering if she should resume metformin [place] started at a weight loss clinic on phentermine urgently on  No contraception, sottas she is not sexually active but is in a relationship, by the end of the visit, was very keen on resuming OCP deemed "safe" I have sent a message to her gynecologist Immunization is reviewed , and  Is current   PE: BP 130/82   Pulse 85   Resp 16   Ht 5\' 4"  (1.626 m)   Wt 217 lb 6.4 oz (98.6 kg)   LMP 01/10/2018 (Exact Date)   SpO2 99%   BMI 37.32 kg/m   Pleasant  female, alert and oriented x 3, in no cardio-pulmonary distress. Afebrile. HEENT No facial trauma or asymetry. Sinuses non tender.  Extra occullar muscles intact, pupils equally reactive to light. External ears normal, tympanic membranes clear. Oropharynx moist, no exudate. Neck: supple, no adenopathy,JVD or thyromegaly.No bruits.  Chest: Clear to ascultation bilaterally.No crackles or wheezes. Non tender to palpation  Breast: No asymetry,no masses or lumps. No tenderness. No nipple discharge or inversion. No axillary or supraclavicular adenopathy  Cardiovascular system; Heart sounds normal,  S1 and  S2 ,no S3.  No murmur, or thrill. Apical beat not displaced Peripheral pulses normal.  Abdomen: Soft, non tender, no organomegaly or masses. No bruits. Bowel sounds normal. No guarding, tenderness or rebound.  Rectal:  Not indicated.  GU: Has gyne exams.   Musculoskeletal exam: Full ROM of spine, hips , shoulders and knees. No deformity ,swelling or crepitus noted. No muscle wasting or atrophy.   Neurologic: Cranial nerves 2 to 12 intact. Power, tone ,sensation and reflexes normal throughout. No disturbance in gait. No tremor.  Skin: Intact, no ulceration, erythema , scaling or rash noted. Pigmentation normal  throughout  Psych; Normal mood and affect. Judgement and concentration normal   Assessment & Plan:  Annual physical exam Annual exam as documented. Counseling done  re healthy lifestyle involving commitment to 150 minutes exercise per week, heart healthy diet, and attaining healthy weight.The importance of adequate sleep also discussed.  Changes in health habits are decided on by the patient with goals and time frames  set for achieving them.   Borderline diabetes Patient educated about the importance of limiting  Carbohydrate intake , the need to commit to daily physical activity for a minimum of 30 minutes , and to commit weight loss. The fact that changes in all these areas will reduce or eliminate all together the development of diabetes is stressed.   Diabetic Labs Latest Ref Rng & Units 02/07/2017 09/13/2016 07/30/2016 01/18/2016 09/18/2015  HbA1c <5.7 % 5.6 - - 5.7(H) -  Chol 100 - 199 mg/dL - - - - 159  HDL >39 mg/dL - - - - 50  Calc LDL 0 - 99 mg/dL - - - - 97  Triglycerides 0 - 149 mg/dL - - - - 59  Creatinine 0.50 - 1.10 mg/dL 0.67 0.70 0.54 - 0.69   BP/Weight 01/25/2018 12/22/2017 11/30/2017 10/26/2017 09/21/2017 03/02/2017 3/87/5643  Systolic BP 329 518 841 660 630 160 109  Diastolic BP 82 70 75 70 80 71 80  Wt. (Lbs) 217.4 218 219 219.7 221.2 224 224.8  BMI 37.32 37.42 37.59 37.71 37.97 39.68 39.82   No flowsheet data found.    Prediabetes Patient  educated about the importance of limiting  Carbohydrate intake , the need to commit to daily physical activity for a minimum of 30 minutes , and to commit weight loss. The fact that changes in all these areas will reduce or eliminate all together the development of diabetes is stressed.  Deteriorated, HBa1C of 6.1 in 01/2018  Diabetic Labs Latest Ref Rng & Units 02/07/2017 09/13/2016 07/30/2016 01/18/2016 09/18/2015  HbA1c <5.7 % 5.6 - - 5.7(H) -  Chol 100 - 199 mg/dL - - - - 159  HDL >39 mg/dL - - - - 50  Calc LDL 0 - 99 mg/dL -  - - - 97  Triglycerides 0 - 149 mg/dL - - - - 59  Creatinine 0.50 - 1.10 mg/dL 0.67 0.70 0.54 - 0.69   BP/Weight 01/25/2018 12/22/2017 11/30/2017 10/26/2017 09/21/2017 03/02/2017 0/06/3817  Systolic BP 299 371 696 789 381 017 510  Diastolic BP 82 70 75 70 80 71 80  Wt. (Lbs) 217.4 218 219 219.7 221.2 224 224.8  BMI 37.32 37.42 37.59 37.71 37.97 39.68 39.82   No flowsheet data found.    Morbid obesity ([place]) Deteriorated.Currently in weight loss clinic and on phentermine Patient re-educated about  the importance of commitment to a  minimum of 150 minutes of exercise per week.  The importance of healthy food choices with portion control discussed. Encouraged to start a food diary, count calories and to consider  joining a support group. Sample diet sheets offered. Goals set by the patient for the next several months.   Weight /BMI 01/25/2018 12/22/2017 11/30/2017  WEIGHT 217 lb 6.4 oz 218 lb 219 lb  HEIGHT 5\' 4"  5\' 4"  5\' 4"   BMI 37.32 kg/m2 37.42 kg/m2 37.59 kg/m2

## 2018-01-28 NOTE — Assessment & Plan Note (Addendum)
Patient educated about the importance of limiting  Carbohydrate intake , the need to commit to daily physical activity for a minimum of 30 minutes , and to commit weight loss. The fact that changes in all these areas will reduce or eliminate all together the development of diabetes is stressed.  Deteriorated, HBa1C of 6.1 in 01/2018  Diabetic Labs Latest Ref Rng & Units 02/07/2017 09/13/2016 07/30/2016 01/18/2016 09/18/2015  HbA1c <5.7 % 5.6 - - 5.7(H) -  Chol 100 - 199 mg/dL - - - - 159  HDL >39 mg/dL - - - - 50  Calc LDL 0 - 99 mg/dL - - - - 97  Triglycerides 0 - 149 mg/dL - - - - 59  Creatinine 0.50 - 1.10 mg/dL 0.67 0.70 0.54 - 0.69   BP/Weight 01/25/2018 12/22/2017 11/30/2017 10/26/2017 09/21/2017 03/02/2017 11/16/2180  Systolic BP 883 374 451 460 479 987 215  Diastolic BP 82 70 75 70 80 71 80  Wt. (Lbs) 217.4 218 219 219.7 221.2 224 224.8  BMI 37.32 37.42 37.59 37.71 37.97 39.68 39.82   No flowsheet data found.

## 2018-01-28 NOTE — Assessment & Plan Note (Signed)
Deteriorated.Currently in weight loss clinic and on phentermine Patient re-educated about  the importance of commitment to a  minimum of 150 minutes of exercise per week.  The importance of healthy food choices with portion control discussed. Encouraged to start a food diary, count calories and to consider  joining a support group. Sample diet sheets offered. Goals set by the patient for the next several months.   Weight /BMI 01/25/2018 12/22/2017 11/30/2017  WEIGHT 217 lb 6.4 oz 218 lb 219 lb  HEIGHT 5\' 4"  5\' 4"  5\' 4"   BMI 37.32 kg/m2 37.42 kg/m2 37.59 kg/m2

## 2018-01-28 NOTE — Assessment & Plan Note (Signed)
Patient educated about the importance of limiting  Carbohydrate intake , the need to commit to daily physical activity for a minimum of 30 minutes , and to commit weight loss. The fact that changes in all these areas will reduce or eliminate all together the development of diabetes is stressed.   Diabetic Labs Latest Ref Rng & Units 02/07/2017 09/13/2016 07/30/2016 01/18/2016 09/18/2015  HbA1c <5.7 % 5.6 - - 5.7(H) -  Chol 100 - 199 mg/dL - - - - 159  HDL >39 mg/dL - - - - 50  Calc LDL 0 - 99 mg/dL - - - - 97  Triglycerides 0 - 149 mg/dL - - - - 59  Creatinine 0.50 - 1.10 mg/dL 0.67 0.70 0.54 - 0.69   BP/Weight 01/25/2018 12/22/2017 11/30/2017 10/26/2017 09/21/2017 03/02/2017 5/46/2703  Systolic BP 500 938 182 993 716 967 893  Diastolic BP 82 70 75 70 80 71 80  Wt. (Lbs) 217.4 218 219 219.7 221.2 224 224.8  BMI 37.32 37.42 37.59 37.71 37.97 39.68 39.82   No flowsheet data found.

## 2018-01-28 NOTE — Assessment & Plan Note (Signed)
Annual exam as documented. Counseling done  re healthy lifestyle involving commitment to 150 minutes exercise per week, heart healthy diet, and attaining healthy weight.The importance of adequate sleep also discussed.  Changes in health habits are decided on by the patient with goals and time frames  set for achieving them.

## 2018-02-01 ENCOUNTER — Encounter: Payer: Self-pay | Admitting: Family Medicine

## 2018-02-02 ENCOUNTER — Encounter: Payer: Self-pay | Admitting: Family Medicine

## 2018-02-19 DIAGNOSIS — R7303 Prediabetes: Secondary | ICD-10-CM | POA: Diagnosis not present

## 2018-03-07 ENCOUNTER — Telehealth: Payer: Self-pay | Admitting: Family Medicine

## 2018-03-07 NOTE — Telephone Encounter (Signed)
Patient would like for you to send her a message in her mychart with any recommendations. I don't believe she is interested in going to urgent care.

## 2018-03-07 NOTE — Telephone Encounter (Signed)
Pt is calling in as she hasn't had a voice in 2 weeks, then on Sunday she started having fever, felt bad.but the fever is gone, but still doesn't feel that great. Told her I would leave a msg for the nurse, I dont see available appt

## 2018-03-08 ENCOUNTER — Encounter: Payer: Self-pay | Admitting: Family Medicine

## 2018-03-08 NOTE — Telephone Encounter (Signed)
I sent a message to pt the morning of 03/08/2018

## 2018-03-08 NOTE — Telephone Encounter (Signed)
Pt called to sch appt, and there was no availability this afternoon, I offered the appointment for Monday June 3rd, and she said she would see how the weekend went and call us back

## 2018-03-09 DIAGNOSIS — R7303 Prediabetes: Secondary | ICD-10-CM | POA: Diagnosis not present

## 2018-03-19 ENCOUNTER — Ambulatory Visit (HOSPITAL_COMMUNITY)
Admission: RE | Admit: 2018-03-19 | Discharge: 2018-03-19 | Disposition: A | Discharge status: Home / Self Care | Payer: 59 | Source: Ambulatory Visit | Attending: Family Medicine | Admitting: Family Medicine

## 2018-03-19 ENCOUNTER — Telehealth: Payer: Self-pay | Admitting: Family Medicine

## 2018-03-19 ENCOUNTER — Ambulatory Visit: Payer: 59 | Admitting: Family Medicine

## 2018-03-19 ENCOUNTER — Encounter: Payer: Self-pay | Admitting: Family Medicine

## 2018-03-19 VITALS — BP 130/92 | HR 110 | Temp 99.8°F | Resp 16 | Ht 64.0 in | Wt 199.0 lb

## 2018-03-19 DIAGNOSIS — E876 Hypokalemia: Secondary | ICD-10-CM

## 2018-03-19 DIAGNOSIS — J209 Acute bronchitis, unspecified: Secondary | ICD-10-CM | POA: Diagnosis not present

## 2018-03-19 DIAGNOSIS — R05 Cough: Secondary | ICD-10-CM | POA: Diagnosis not present

## 2018-03-19 DIAGNOSIS — R509 Fever, unspecified: Secondary | ICD-10-CM | POA: Diagnosis not present

## 2018-03-19 LAB — CBC WITH DIFFERENTIAL/PLATELET
Basophils Absolute: 66 cells/uL (ref 0–200)
Basophils Relative: 0.5 %
Eosinophils Absolute: 92 cells/uL (ref 15–500)
Eosinophils Relative: 0.7 %
HCT: 37.3 % (ref 35.0–45.0)
Hemoglobin: 12.7 g/dL (ref 11.7–15.5)
Lymphs Abs: 2323 cells/uL (ref 850–3900)
MCH: 27.5 pg (ref 27.0–33.0)
MCHC: 34 g/dL (ref 32.0–36.0)
MCV: 80.7 fL (ref 80.0–100.0)
MONOS PCT: 8.6 %
MPV: 9.6 fL (ref 7.5–12.5)
NEUTROS PCT: 72.6 %
Neutro Abs: 9583 cells/uL — ABNORMAL HIGH (ref 1500–7800)
PLATELETS: 387 10*3/uL (ref 140–400)
RBC: 4.62 10*6/uL (ref 3.80–5.10)
RDW: 13.2 % (ref 11.0–15.0)
TOTAL LYMPHOCYTE: 17.6 %
WBC mixed population: 1135 cells/uL — ABNORMAL HIGH (ref 200–950)
WBC: 13.2 10*3/uL — AB (ref 3.8–10.8)

## 2018-03-19 LAB — BASIC METABOLIC PANEL
BUN: 8 mg/dL (ref 7–25)
CHLORIDE: 103 mmol/L (ref 98–110)
CO2: 28 mmol/L (ref 20–32)
CREATININE: 0.75 mg/dL (ref 0.50–1.10)
Calcium: 9.1 mg/dL (ref 8.6–10.2)
GLUCOSE: 89 mg/dL (ref 65–139)
Potassium: 3.1 mmol/L — ABNORMAL LOW (ref 3.5–5.3)
Sodium: 139 mmol/L (ref 135–146)

## 2018-03-19 MED ORDER — SULFAMETHOXAZOLE-TRIMETHOPRIM 800-160 MG PO TABS: 1.0000 | ORAL_TABLET | Freq: Two times a day (BID) | ORAL | 0 refills | Status: DC

## 2018-03-19 MED ORDER — BENZONATATE 100 MG PO CAPS: 100.0000 mg | ORAL_CAPSULE | Freq: Two times a day (BID) | ORAL | 0 refills | Status: DC | PRN

## 2018-03-19 MED ORDER — PROMETHAZINE-DM 6.25-15 MG/5ML PO SYRP
ORAL_SOLUTION | ORAL | 0 refills | Status: DC
Start: 2018-03-19 — End: 2018-07-10

## 2018-03-19 NOTE — Patient Instructions (Signed)
F/u as before, call if you need me sooner  You are treated for acute bronchitis  Please get labs and CXR at hospital today  Work excuse from today to return on Friday May 10 , please call if not improved  I will send you message re lab and CXR report  Decongestant , antibiotic and cough suppressant are sent to your pharmacy

## 2018-03-19 NOTE — Telephone Encounter (Signed)
Can work in anytime tomorrow

## 2018-03-19 NOTE — Telephone Encounter (Signed)
Fever, lots of mucus, body aches ---been going on 3 weeks.--can she be seen today --15 mins away ---

## 2018-03-20 ENCOUNTER — Encounter: Payer: Self-pay | Admitting: Family Medicine

## 2018-03-20 DIAGNOSIS — E876 Hypokalemia: Secondary | ICD-10-CM | POA: Insufficient documentation

## 2018-03-20 MED ORDER — POTASSIUM CHLORIDE ER 10 MEQ PO TBCR: 10.0000 meq | EXTENDED_RELEASE_TABLET | Freq: Two times a day (BID) | ORAL | 3 refills | Status: DC

## 2018-03-20 NOTE — Assessment & Plan Note (Signed)
Potassium is low, will send in twice daily potassium

## 2018-03-20 NOTE — Assessment & Plan Note (Signed)
3 week h/o intermittent fever and chills, worse in past 5 days with body aches, temp of 101 and yellow sputum. Antibiotic, decongestant and cough suppressant prescribed, needs CXR and  Lab data. Work excuse to return in 3 days if able , depending on data

## 2018-03-20 NOTE — Progress Notes (Signed)
   Janet [name]     MRN: [id number]      DOB: Jul 02, 1986   HPI Janet [name] is herewith a 3 week h/o cough , fevr and chills intermittently. Pt reports that she was sick for about 5 days,improved, then in the past 5 days has worsened again with a temp over 100 yesterday.She denies sinus pressure or drainage, ear pain or sore throat.  Feels as though a truck ran over her , has a lot of body aches  ROS See HPI  Denies chest pains, palpitations and leg swelling Denies abdominal pain, nausea, vomiting,diarrhea or constipation.   Denies dysuria, frequency, hesitancy or incontinence.  Denies headaches, seizures, numbness, or tingling. Denies depression, anxiety or insomnia. Denies skin break down or rash.   PE  BP (!) 130/92   Pulse (!) 110   Temp 99.8 F (37.7 C) (Oral)   Resp 16   Ht 5\' 4"  (1.626 m)   Wt 199 lb (90.3 kg)   SpO2 99%   BMI 34.16 kg/m   Patient alert ill appearing HEENT: No facial asymmetry, EOMI,   oropharynx pink and moist.  Neck supple no JVD, no mass.  Chest: adequate air entry, decreased in left posterior base  CVS: S1, S2 no murmurs, no S3.Regular rate.  ABD: Soft non tender.   Ext: No edema  MS: Adequate ROM spine, shoulders, hips and knees.  Skin: Intact, no ulcerations or rash noted.  Psych: Good eye contact, normal affect. Memory intact not anxious or depressed appearing.  CNS: CN 2-12 intact, power,  normal throughout.no focal deficits noted.   Assessment & Plan Acute bronchitis 3 week h/o intermittent fever and chills, worse in past 5 days with body aches, temp of 101 and yellow sputum. Antibiotic, decongestant and cough suppressant prescribed, needs CXR and  Lab data. Work excuse to return in 3 days if able , depending on data  Hypokalemia Potassium is low, will send in twice daily potassium

## 2018-03-21 ENCOUNTER — Telehealth: Payer: Self-pay | Admitting: Family Medicine

## 2018-03-21 ENCOUNTER — Other Ambulatory Visit: Payer: Self-pay | Admitting: Family Medicine

## 2018-03-21 ENCOUNTER — Telehealth: Payer: Self-pay | Admitting: Pediatrics

## 2018-03-21 MED ORDER — OSELTAMIVIR PHOSPHATE 75 MG PO CAPS: 75.0000 mg | ORAL_CAPSULE | Freq: Two times a day (BID) | ORAL | 0 refills | Status: DC

## 2018-03-21 MED ORDER — CYCLOBENZAPRINE HCL 5 MG PO TABS: ORAL_TABLET | ORAL | 0 refills | Status: DC

## 2018-03-21 NOTE — Telephone Encounter (Signed)
Patient aware and will come do the strep test today

## 2018-03-21 NOTE — Telephone Encounter (Signed)
Patient called in stating she was seen Monday, as of today her symptoms have not improved even after taking the antibiotic. She says her neck hurts and her throat. Requesting a strep test. Cb#: [phone]

## 2018-03-21 NOTE — Telephone Encounter (Signed)
pls call pt in for strep test, also let her know I am sending in muscle relaxer for muscle aches and pains, the only other thing that can be covered but not tested for is influenza, and I will send in rt tamiflu also, she should stay on the antibiotic she has

## 2018-03-22 ENCOUNTER — Ambulatory Visit (INDEPENDENT_AMBULATORY_CARE_PROVIDER_SITE_OTHER): Payer: 59

## 2018-03-22 DIAGNOSIS — J029 Acute pharyngitis, unspecified: Secondary | ICD-10-CM

## 2018-03-22 LAB — POCT RAPID STREP A (OFFICE): RAPID STREP A SCREEN: NEGATIVE

## 2018-03-22 NOTE — Telephone Encounter (Signed)
Strep test done and is negive

## 2018-03-22 NOTE — Progress Notes (Signed)
Patient aware strep negative and to continue prescribed medications

## 2018-03-29 DIAGNOSIS — E559 Vitamin D deficiency, unspecified: Secondary | ICD-10-CM | POA: Diagnosis not present

## 2018-03-29 DIAGNOSIS — R7303 Prediabetes: Secondary | ICD-10-CM | POA: Diagnosis not present

## 2018-04-04 DIAGNOSIS — R7303 Prediabetes: Secondary | ICD-10-CM | POA: Diagnosis not present

## 2018-05-10 ENCOUNTER — Other Ambulatory Visit: Payer: Self-pay | Admitting: Obstetrics and Gynecology

## 2018-05-24 DIAGNOSIS — E669 Obesity, unspecified: Secondary | ICD-10-CM | POA: Diagnosis not present

## 2018-05-24 DIAGNOSIS — R7309 Other abnormal glucose: Secondary | ICD-10-CM | POA: Diagnosis not present

## 2018-06-06 DIAGNOSIS — E669 Obesity, unspecified: Secondary | ICD-10-CM | POA: Diagnosis not present

## 2018-06-06 DIAGNOSIS — R635 Abnormal weight gain: Secondary | ICD-10-CM | POA: Diagnosis not present

## 2018-06-06 DIAGNOSIS — R7303 Prediabetes: Secondary | ICD-10-CM | POA: Diagnosis not present

## 2018-06-20 DIAGNOSIS — R7303 Prediabetes: Secondary | ICD-10-CM | POA: Diagnosis not present

## 2018-06-20 DIAGNOSIS — E669 Obesity, unspecified: Secondary | ICD-10-CM | POA: Diagnosis not present

## 2018-06-20 DIAGNOSIS — E7800 Pure hypercholesterolemia, unspecified: Secondary | ICD-10-CM | POA: Diagnosis not present

## 2018-07-10 ENCOUNTER — Ambulatory Visit: Payer: 59 | Admitting: Family Medicine

## 2018-07-10 ENCOUNTER — Other Ambulatory Visit: Payer: Self-pay

## 2018-07-10 ENCOUNTER — Encounter: Payer: Self-pay | Admitting: Family Medicine

## 2018-07-10 VITALS — BP 124/80 | HR 72 | Resp 12 | Ht 63.0 in | Wt 199.1 lb

## 2018-07-10 DIAGNOSIS — E559 Vitamin D deficiency, unspecified: Secondary | ICD-10-CM

## 2018-07-10 DIAGNOSIS — R7303 Prediabetes: Secondary | ICD-10-CM | POA: Diagnosis not present

## 2018-07-10 DIAGNOSIS — K219 Gastro-esophageal reflux disease without esophagitis: Secondary | ICD-10-CM | POA: Diagnosis not present

## 2018-07-10 DIAGNOSIS — N926 Irregular menstruation, unspecified: Secondary | ICD-10-CM

## 2018-07-10 DIAGNOSIS — Z23 Encounter for immunization: Secondary | ICD-10-CM

## 2018-07-10 MED ORDER — NORETHINDRONE 0.35 MG PO TABS: 1.0000 | ORAL_TABLET | Freq: Every day | ORAL | 3 refills | Status: DC

## 2018-07-10 NOTE — Patient Instructions (Addendum)
F/U in mid to end January, call if you need me before  Flu vaccine today  Start OCP today on day 1  CONGRATS on weight loss, and improved health habits   Chem 7 and EGFR, CBC, HBA1c, vitamin D  Today, I will and result to you, and let you know of  your need for potassium and vit D  Thank you  for choosing [facility]. We consider it a privelige to serve you.  Delivering excellent health care in a caring and  compassionate way is our goal.  Partnering with you,  so that together we can achieve this goal is our strategy.       

## 2018-07-11 ENCOUNTER — Encounter: Payer: Self-pay | Admitting: Family Medicine

## 2018-07-11 ENCOUNTER — Other Ambulatory Visit: Payer: Self-pay | Admitting: Family Medicine

## 2018-07-11 LAB — BASIC METABOLIC PANEL WITH GFR
BUN: 12 mg/dL (ref 7–25)
CO2: 26 mmol/L (ref 20–32)
CREATININE: 0.77 mg/dL (ref 0.50–1.10)
Calcium: 8.9 mg/dL (ref 8.6–10.2)
Chloride: 105 mmol/L (ref 98–110)
GFR, Est African American: 118 mL/min/{1.73_m2} (ref >=60)
GFR, Est Non African American: 102 mL/min/{1.73_m2} (ref >=60)
Glucose, Bld: 89 mg/dL (ref 65–139)
Potassium: 3.7 mmol/L (ref 3.5–5.3)
Sodium: 140 mmol/L (ref 135–146)

## 2018-07-11 LAB — CBC
HCT: 35.2 % (ref 35.0–45.0)
Hemoglobin: 11.8 g/dL (ref 11.7–15.5)
MCH: 26.9 pg — ABNORMAL LOW (ref 27.0–33.0)
MCHC: 33.5 g/dL (ref 32.0–36.0)
MCV: 80.4 fL (ref 80.0–100.0)
MPV: 9.7 fL (ref 7.5–12.5)
PLATELETS: 391 10*3/uL (ref 140–400)
RBC: 4.38 10*6/uL (ref 3.80–5.10)
RDW: 13.1 % (ref 11.0–15.0)
WBC: 6.9 10*3/uL (ref 3.8–10.8)

## 2018-07-11 LAB — HEMOGLOBIN A1C
Hgb A1c MFr Bld: 5.7 % of total Hgb — ABNORMAL HIGH (ref <5.7)
MEAN PLASMA GLUCOSE: 117 (calc)
eAG (mmol/L): 6.5 (calc)

## 2018-07-11 LAB — VITAMIN D 25 HYDROXY (VIT D DEFICIENCY, FRACTURES): Vit D, 25-Hydroxy: 17 ng/mL — ABNORMAL LOW (ref 30–100)

## 2018-07-11 MED ORDER — ERGOCALCIFEROL 1.25 MG (50000 UT) PO CAPS: 50000.0000 [IU] | ORAL_CAPSULE | ORAL | 1 refills | Status: DC

## 2018-07-13 ENCOUNTER — Encounter: Payer: Self-pay | Admitting: Family Medicine

## 2018-07-13 NOTE — Assessment & Plan Note (Addendum)
  Markedly improved with change in diet, and exercise, encourahed to aim for normal HBA1C Patient educated about the importance of limiting  Carbohydrate intake , the need to commit to daily physical activity for a minimum of 30 minutes , and to commit weight loss. The fact that changes in all these areas will reduce or eliminate all together the development of diabetes is stressed.   Diabetic Labs Latest Ref Rng & Units 07/10/2018 03/19/2018 02/07/2017 09/13/2016 07/30/2016  HbA1c <5.7 % of total Hgb 5.7(H) - 5.6 - -  Chol 100 - 199 mg/dL - - - - -  HDL >39 mg/dL - - - - -  Calc LDL 0 - 99 mg/dL - - - - -  Triglycerides 0 - 149 mg/dL - - - - -  Creatinine 0.50 - 1.10 mg/dL 0.77 0.75 0.67 0.70 0.54   BP/Weight 07/10/2018 03/19/2018 01/25/2018 12/22/2017 11/30/2017 10/26/2017 83/15/1761  Systolic BP 607 371 062 694 854 627 035  Diastolic BP 80 92 82 70 75 70 80  Wt. (Lbs) 199.12 199 217.4 218 219 219.7 221.2  BMI 35.27 34.16 37.32 37.42 37.59 37.71 37.97   No flowsheet data found.

## 2018-07-13 NOTE — Assessment & Plan Note (Signed)
Start OCP on day of visit, prescription is sent

## 2018-07-13 NOTE — Progress Notes (Signed)
Janet [name]     MRN: [id number]      DOB: 1985/12/02   HPI [name] is here for follow up and re-evaluation of chronic medical conditions, medication management and review of any available recent lab and radiology data.  Preventive health is updated, specifically  Cancer screening and Immunization.   Questions or concerns regarding consultations or procedures which the PT has had in the interim are  addressed. The PT states she did not tolerate phentermine and has worked on lifestyle change with very successful weight loss, which is wonderful. Requests OCP be prescribed , she is on day 1 of her cycle  ROS Denies recent fever or chills. Denies sinus pressure, nasal congestion, ear pain or sore throat. Denies chest congestion, productive cough or wheezing. Denies chest pains, palpitations and leg swelling Denies abdominal pain, nausea, vomiting,diarrhea or constipation.   Denies dysuria, frequency, hesitancy or incontinence. Denies joint pain, swelling and limitation in mobility. Denies headaches, seizures, numbness, or tingling. Denies depression, anxiety or insomnia. Denies skin break down or rash.   PE  BP 124/80   Pulse 72   Resp 12   Ht 5\' 3"  (1.6 m)   Wt 199 lb 1.9 oz (90.3 kg)   SpO2 99% Comment: room air  BMI 35.27 kg/m   Patient alert and oriented and in no cardiopulmonary distress.  HEENT: No facial asymmetry, EOMI,   oropharynx pink and moist.  Neck supple no JVD, no mass.  Chest: Clear to auscultation bilaterally.  CVS: S1, S2 no murmurs, no S3.Regular rate.  ABD: Soft non tender.   Ext: No edema  MS: Adequate ROM spine, shoulders, hips and knees.  Skin: Intact, no ulcerations or rash noted.  Psych: Good eye contact, normal affect. Memory intact not anxious or depressed appearing.  CNS: CN 2-12 intact, power,  normal throughout.no focal deficits noted.   Assessment & Plan  GERD (gastroesophageal reflux disease) Controlled, no change in  medication   Morbid obesity ([place]) Improved, she is applauded on this  Patient re-educated about  the importance of commitment to a  minimum of 150 minutes of exercise per week.  The importance of healthy food choices with portion control discussed. Encouraged to start a food diary, count calories and to consider  joining a support group. Sample diet sheets offered. Goals set by the patient for the next several months.   Weight /BMI 07/10/2018 03/19/2018 01/25/2018  WEIGHT 199 lb 1.9 oz 199 lb 217 lb 6.4 oz  HEIGHT 5\' 3"  5\' 4"  5\' 4"   BMI 35.27 kg/m2 34.16 kg/m2 37.32 kg/m2      Menstrual periods irregular Start OCP on day of visit, prescription is sent  Prediabetes  Markedly improved with change in diet, and exercise, encourahed to aim for normal HBA1C Patient educated about the importance of limiting  Carbohydrate intake , the need to commit to daily physical activity for a minimum of 30 minutes , and to commit weight loss. The fact that changes in all these areas will reduce or eliminate all together the development of diabetes is stressed.   Diabetic Labs Latest Ref Rng & Units 07/10/2018 03/19/2018 02/07/2017 09/13/2016 07/30/2016  HbA1c <5.7 % of total Hgb 5.7(H) - 5.6 - -  Chol 100 - 199 mg/dL - - - - -  HDL >39 mg/dL - - - - -  Calc LDL 0 - 99 mg/dL - - - - -  Triglycerides 0 - 149 mg/dL - - - - -  Creatinine  0.50 - 1.10 mg/dL 0.77 0.75 0.67 0.70 0.54   BP/Weight 07/10/2018 03/19/2018 01/25/2018 12/22/2017 11/30/2017 10/26/2017 24/26/8341  Systolic BP 962 229 798 921 194 174 081  Diastolic BP 80 92 82 70 75 70 80  Wt. (Lbs) 199.12 199 217.4 218 219 219.7 221.2  BMI 35.27 34.16 37.32 37.42 37.59 37.71 37.97   No flowsheet data found.    Vitamin D deficiency Not adequately corrected continue weekly vit D

## 2018-07-13 NOTE — Assessment & Plan Note (Signed)
Not adequately corrected continue weekly vit D

## 2018-07-13 NOTE — Assessment & Plan Note (Signed)
Controlled, no change in medication  

## 2018-07-13 NOTE — Assessment & Plan Note (Signed)
Improved, she is applauded on this  Patient re-educated about  the importance of commitment to a  minimum of 150 minutes of exercise per week.  The importance of healthy food choices with portion control discussed. Encouraged to start a food diary, count calories and to consider  joining a support group. Sample diet sheets offered. Goals set by the patient for the next several months.   Weight /BMI 07/10/2018 03/19/2018 01/25/2018  WEIGHT 199 lb 1.9 oz 199 lb 217 lb 6.4 oz  HEIGHT 5\' 3"  5\' 4"  5\' 4"   BMI 35.27 kg/m2 34.16 kg/m2 37.32 kg/m2

## 2018-09-25 ENCOUNTER — Encounter: Payer: Self-pay | Admitting: Family Medicine

## 2018-09-25 ENCOUNTER — Ambulatory Visit (INDEPENDENT_AMBULATORY_CARE_PROVIDER_SITE_OTHER): Payer: 59 | Admitting: Family Medicine

## 2018-09-25 VITALS — BP 140/90 | HR 74 | Resp 10 | Ht 63.0 in | Wt 210.0 lb

## 2018-09-25 DIAGNOSIS — F4329 Adjustment disorder with other symptoms: Secondary | ICD-10-CM | POA: Diagnosis not present

## 2018-09-25 DIAGNOSIS — I10 Essential (primary) hypertension: Secondary | ICD-10-CM | POA: Diagnosis not present

## 2018-09-25 DIAGNOSIS — N926 Irregular menstruation, unspecified: Secondary | ICD-10-CM | POA: Diagnosis not present

## 2018-09-25 DIAGNOSIS — G43009 Migraine without aura, not intractable, without status migrainosus: Secondary | ICD-10-CM

## 2018-09-25 DIAGNOSIS — D509 Iron deficiency anemia, unspecified: Secondary | ICD-10-CM

## 2018-09-25 MED ORDER — ERGOCALCIFEROL 1.25 MG (50000 UT) PO CAPS: 50000.0000 [IU] | ORAL_CAPSULE | ORAL | 3 refills | Status: DC

## 2018-09-25 NOTE — Patient Instructions (Addendum)
F/U in 8 to 10 weeks, call if you need me before  STOP birth control and expect to bleed in the next 1 week  Headaches, weight and wellbeing should improve off the pill which has not been good for you.  6 hrs sleep / night  30 mins exercise every day  HELP from family esp as you have to refocus on your personal health and exams  Vit D is prescribed ofr 3 monhts/  Pickup  CBC, iron and ferritin, and TSH today

## 2018-09-25 NOTE — Progress Notes (Signed)
   Janet [name]     MRN: [id number]      DOB: [birth year]   HPI Janet [name] is here for follow up and re-evaluation of chronic medical conditions, medication management and review of any available recent lab and radiology data.  C/o daily headaches rated at 8 to 10 , no relief with sleep, BC powder and tylenol some relief, throbbing start around 7am,duration entire day worse in the past 2 weeks, noted initially when she first started the pills, has past headache history but currently out of control on OCP  Also longer heavier cycles  On avg 4 days with clots since starting .OCP, also c/o ankle swelling and tingling in the feet C/o stress juggling child care, work and school, good family support and good relationship with her partner, thinking of baby # 2 but wants to lose weight first  Very frustrated with weight gain despite " not doing anything different" than when she was losing weight, will be more diciplined with food choice   ROS Denies recent fever or chills. Denies sinus pressure, nasal congestion, ear pain or sore throat. Denies chest congestion, productive cough or wheezing. Denies chest pains, palpitations  Denies abdominal pain, nausea, vomiting,diarrhea or constipation.   Denies dysuria, frequency, hesitancy or incontinence. Denies joint pain, swelling and limitation in mobility.  Denies skin break down or rash.   PE  BP 138/84 (BP Location: Left Arm, Patient Position: Sitting, Cuff Size: Normal)   Pulse 74   Resp 10   Ht 5\' 3"  (1.6 m)   Wt 210 lb (95.3 kg)   SpO2 98% Comment: room air  BMI 37.20 kg/m   Patient alert and oriented and in no cardiopulmonary distress.  HEENT: No facial asymmetry, EOMI,   oropharynx pink and moist.  Neck supple no JVD, no mass.  Chest: Clear to auscultation bilaterally.  CVS: S1, S2 no murmurs, no S3.Regular rate.  ABD: Soft non tender.   Ext: trace  edema  MS: Adequate ROM spine, shoulders, hips and knees.  Skin: Intact, no  ulcerations or rash noted.  Psych: Good eye contact, normal affect. Memory intact mildly  anxious not  depressed appearing.  CNS: CN 2-12 intact, power,  normal throughout.no focal deficits noted.   Assessment & Plan  Menstrual periods irregular Pt to discontinue contraceptive pill due to s/ew of weight gain, headaches, leg swelling.Ready for baby #2 except for her weight which she is working on, and reports very little activity due to stress and work   Common migraine Increased frequency and uncontrolled , non responsive to medication, this since starting oCP, recommend d/c same  Morbid obesity ([place]) Deteriorated. Patient re-educated about  the importance of commitment to a  minimum of 150 minutes of exercise per week.  The importance of healthy food choices with portion control discussed. Encouraged to start a food diary, count calories and to consider  joining a support group. Sample diet sheets offered. Goals set by the patient for the next several months.   Weight /BMI 09/25/2018 07/10/2018 03/19/2018  WEIGHT 210 lb 199 lb 1.9 oz 199 lb  HEIGHT 5\' 3"  5\' 3"  5\' 4"   BMI 37.2 kg/m2 35.27 kg/m2 34.16 kg/m2      Stress and adjustment reaction Increased personal stress from work and home responsibilities as well as school, time management and help from her family discussed

## 2018-09-26 ENCOUNTER — Encounter: Payer: Self-pay | Admitting: Family Medicine

## 2018-09-26 LAB — FERRITIN: Ferritin: 18 ng/mL (ref 16–154)

## 2018-09-26 LAB — CBC
HCT: 35.7 % (ref 35.0–45.0)
Hemoglobin: 11.6 g/dL — ABNORMAL LOW (ref 11.7–15.5)
MCH: 26.7 pg — AB (ref 27.0–33.0)
MCHC: 32.5 g/dL (ref 32.0–36.0)
MCV: 82.3 fL (ref 80.0–100.0)
MPV: 9.6 fL (ref 7.5–12.5)
PLATELETS: 419 10*3/uL — AB (ref 140–400)
RBC: 4.34 10*6/uL (ref 3.80–5.10)
RDW: 12.6 % (ref 11.0–15.0)
WBC: 7.1 10*3/uL (ref 3.8–10.8)

## 2018-09-26 LAB — TSH: TSH: 1.76 m[IU]/L

## 2018-09-26 LAB — IRON: Iron: 30 ug/dL — ABNORMAL LOW (ref 40–190)

## 2018-09-28 DIAGNOSIS — F4329 Adjustment disorder with other symptoms: Secondary | ICD-10-CM | POA: Insufficient documentation

## 2018-09-28 NOTE — Assessment & Plan Note (Signed)
Increased personal stress from work and home responsibilities as well as school, time management and help from her family discussed

## 2018-09-28 NOTE — Assessment & Plan Note (Signed)
Increased frequency and uncontrolled , non responsive to medication, this since starting oCP, recommend d/c same

## 2018-09-28 NOTE — Assessment & Plan Note (Signed)
Pt to discontinue contraceptive pill due to s/ew of weight gain, headaches, leg swelling.Ready for baby #2 except for her weight which she is working on, and reports very little activity due to stress and work

## 2018-09-28 NOTE — Assessment & Plan Note (Signed)
Deteriorated. Patient re-educated about  the importance of commitment to a  minimum of 150 minutes of exercise per week.  The importance of healthy food choices with portion control discussed. Encouraged to start a food diary, count calories and to consider  joining a support group. Sample diet sheets offered. Goals set by the patient for the next several months.   Weight /BMI 09/25/2018 07/10/2018 03/19/2018  WEIGHT 210 lb 199 lb 1.9 oz 199 lb  HEIGHT 5\' 3"  5\' 3"  5\' 4"   BMI 37.2 kg/m2 35.27 kg/m2 34.16 kg/m2

## 2018-10-10 NOTE — L&D Delivery Note (Signed)
Patient is 33 y.o. G2P1001 [redacted]w[redacted]d admitted for IOL for pree, hx of pree, pp hemorrhage.   Delivery Note At 1:50 AM a viable female was delivered via Vaginal, Spontaneous (Presentation: vertex;  OP).  APGAR: 8, 9; weight  .   Placenta status: spontaneous, intact.  Cord: 3VC    Anesthesia:  epidural Episiotomy: None Lacerations: None Suture Repair: none Est. Blood Loss (mL): 18  Mom to postpartum.  Baby to Couplet care / Skin to Skin.  Upon arrival patient was complete and pushing. She pushed with good maternal effort to deliver a healthy baby girl. Baby delivered without difficulty, was noted to have good tone and place on maternal abdomen for oral suctioning, drying and stimulation. Delayed cord clamping performed. Placenta delivered intact with 3V cord. Vaginal canal and perineum was inspected and found to be hemostatic. Pitocin was started and uterus massaged until bleeding slowed. Due to history of PP hemorrhage and 688 EBL, TXA was given. Counts of sharps, instruments, and lap pads were all correct.   Matilde Haymaker, MD PGY-2 8/22/20202:19 AM

## 2018-10-24 ENCOUNTER — Encounter: Payer: Self-pay | Admitting: Adult Health

## 2018-10-24 ENCOUNTER — Ambulatory Visit: Payer: 59 | Admitting: Adult Health

## 2018-10-24 VITALS — BP 132/85 | HR 79 | Ht 63.5 in | Wt 210.5 lb

## 2018-10-24 DIAGNOSIS — Z3201 Encounter for pregnancy test, result positive: Secondary | ICD-10-CM | POA: Diagnosis not present

## 2018-10-24 DIAGNOSIS — R110 Nausea: Secondary | ICD-10-CM | POA: Diagnosis not present

## 2018-10-24 DIAGNOSIS — O3680X0 Pregnancy with inconclusive fetal viability, not applicable or unspecified: Secondary | ICD-10-CM

## 2018-10-24 DIAGNOSIS — N926 Irregular menstruation, unspecified: Secondary | ICD-10-CM | POA: Diagnosis not present

## 2018-10-24 DIAGNOSIS — Z0000 Encounter for general adult medical examination without abnormal findings: Secondary | ICD-10-CM | POA: Insufficient documentation

## 2018-10-24 DIAGNOSIS — Z3A01 Less than 8 weeks gestation of pregnancy: Secondary | ICD-10-CM | POA: Insufficient documentation

## 2018-10-24 LAB — POCT URINE PREGNANCY: Preg Test, Ur: POSITIVE — AB

## 2018-10-24 MED ORDER — PRENATAL PLUS 27-1 MG PO TABS: 1.0000 | ORAL_TABLET | Freq: Every day | ORAL | 12 refills | Status: DC

## 2018-10-24 NOTE — Progress Notes (Signed)
Patient ID: Janet [name], female   DOB: [birth year], 33 y.o.   MRN: [id number] History of Present Illness:  Janet [name] is a 33 year old black female, in for UPT, has missed a period and had 1+HPT. PCP is Dr Moshe Cipro.  Current Medications, Allergies, Past Medical History, Past Surgical History, Family History and Social History were reviewed in [employer] record.     Review of Systems: +missed period, +HPT +nausea, (took colon cleanse about a week ago)   Physical Exam:BP 132/85 (BP Location: Left Arm, Cuff Size: Normal)   Pulse 79   Ht 5' 3.5" (1.613 m)   Wt 210 lb 8 oz (95.5 kg)   LMP 09/12/2018   Breastfeeding No   BMI 36.70 kg/m  UPT is +, about 6 weeks by LMP with EDD 06/20/2019. General:  Well developed, well nourished, no acute distress Skin:  Warm and dry Neck:  Midline trachea, normal thyroid, good ROM, no lymphadenopathy Lungs; Clear to auscultation bilaterally Cardiovascular: Regular rate and rhythm Abdomen:  Soft, non tender Psych:  No mood changes, alert and cooperative,seems happy Fall risk is low.  Impression:  1. Pregnancy examination or test, positive result   2. Less than [redacted] weeks gestation of pregnancy   3. Encounter to determine fetal viability of pregnancy, single or unspecified fetus      Plan: Eat often Meds ordered this encounter  Medications  . prenatal vitamin w/FE, FA (PRENATAL 1 + 1) 27-1 MG TABS tablet    Sig: Take 1 tablet by mouth daily at 12 noon.    Dispense:  30 each    Refill:  12    Order Specific Question:   Supervising Provider    Answer:   Florian Buff [[id number]]  Return in 1 week for dating US/ 3 weeks for New OB Review handout on First trimester and by [facility]

## 2018-10-24 NOTE — Patient Instructions (Signed)
First Trimester of Pregnancy  The first trimester of pregnancy is from week 1 until the end of week 13 (months 1 through 3). A week after a sperm fertilizes an egg, the egg will implant on the wall of the uterus. This embryo will begin to develop into a baby. Genes from you and your partner will form the baby. The female genes will determine whether the baby will be a boy or a girl. At 6-8 weeks, the eyes and face will be formed, and the heartbeat can be seen on ultrasound. At the end of 12 weeks, all the baby's organs will be formed.  Now that you are pregnant, you will want to do everything you can to have a healthy baby. Two of the most important things are to get good prenatal care and to follow your health care provider's instructions. Prenatal care is all the medical care you receive before the baby's birth. This care will help prevent, find, and treat any problems during the pregnancy and childbirth.  Body changes during your first trimester  Your body goes through many changes during pregnancy. The changes vary from woman to woman.   You may gain or lose a couple of pounds at first.   You may feel sick to your stomach (nauseous) and you may throw up (vomit). If the vomiting is uncontrollable, call your health care provider.   You may tire easily.   You may develop headaches that can be relieved by medicines. All medicines should be approved by your health care provider.   You may urinate more often. Painful urination may mean you have a bladder infection.   You may develop heartburn as a result of your pregnancy.   You may develop constipation because certain hormones are causing the muscles that push stool through your intestines to slow down.   You may develop hemorrhoids or swollen veins (varicose veins).   Your breasts may begin to grow larger and become tender. Your nipples may stick out more, and the tissue that surrounds them (areola) may become darker.   Your gums may bleed and may be  sensitive to brushing and flossing.   Dark spots or blotches (chloasma, mask of pregnancy) may develop on your face. This will likely fade after the baby is born.   Your menstrual periods will stop.   You may have a loss of appetite.   You may develop cravings for certain kinds of food.   You may have changes in your emotions from day to day, such as being excited to be pregnant or being concerned that something may go wrong with the pregnancy and baby.   You may have more vivid and strange dreams.   You may have changes in your hair. These can include thickening of your hair, rapid growth, and changes in texture. Some women also have hair loss during or after pregnancy, or hair that feels dry or thin. Your hair will most likely return to normal after your baby is born.  What to expect at prenatal visits  During a routine prenatal visit:   You will be weighed to make sure you and the baby are growing normally.   Your blood pressure will be taken.   Your abdomen will be measured to track your baby's growth.   The fetal heartbeat will be listened to between weeks 10 and 14 of your pregnancy.   Test results from any previous visits will be discussed.  Your health care provider may ask you:     How you are feeling.   If you are feeling the baby move.   If you have had any abnormal symptoms, such as leaking fluid, bleeding, severe headaches, or abdominal cramping.   If you are using any tobacco products, including cigarettes, chewing tobacco, and electronic cigarettes.   If you have any questions.  Other tests that may be performed during your first trimester include:   Blood tests to find your blood type and to check for the presence of any previous infections. The tests will also be used to check for low iron levels (anemia) and protein on red blood cells (Rh antibodies). Depending on your risk factors, or if you previously had diabetes during pregnancy, you may have tests to check for high blood sugar  that affects pregnant women (gestational diabetes).   Urine tests to check for infections, diabetes, or protein in the urine.   An ultrasound to confirm the proper growth and development of the baby.   Fetal screens for spinal cord problems (spina bifida) and Down syndrome.   HIV (human immunodeficiency virus) testing. Routine prenatal testing includes screening for HIV, unless you choose not to have this test.   You may need other tests to make sure you and the baby are doing well.  Follow these instructions at home:  Medicines   Follow your health care provider's instructions regarding medicine use. Specific medicines may be either safe or unsafe to take during pregnancy.   Take a prenatal vitamin that contains at least 600 micrograms (mcg) of folic acid.   If you develop constipation, try taking a stool softener if your health care provider approves.  Eating and drinking     Eat a balanced diet that includes fresh fruits and vegetables, whole grains, good sources of protein such as meat, eggs, or tofu, and low-fat dairy. Your health care provider will help you determine the amount of weight gain that is right for you.   Avoid raw meat and uncooked cheese. These carry germs that can cause birth defects in the baby.   Eating four or five small meals rather than three large meals a day may help relieve nausea and vomiting. If you start to feel nauseous, eating a few soda crackers can be helpful. Drinking liquids between meals, instead of during meals, also seems to help ease nausea and vomiting.   Limit foods that are high in fat and processed sugars, such as fried and sweet foods.   To prevent constipation:  ? Eat foods that are high in fiber, such as fresh fruits and vegetables, whole grains, and beans.  ? Drink enough fluid to keep your urine clear or pale yellow.  Activity   Exercise only as directed by your health care provider. Most women can continue their usual exercise routine during  pregnancy. Try to exercise for 30 minutes at least 5 days a week. Exercising will help you:  ? Control your weight.  ? Stay in shape.  ? Be prepared for labor and delivery.   Experiencing pain or cramping in the lower abdomen or lower back is a good sign that you should stop exercising. Check with your health care provider before continuing with normal exercises.   Try to avoid standing for long periods of time. Move your legs often if you must stand in one place for a long time.   Avoid heavy lifting.   Wear low-heeled shoes and practice good posture.   You may continue to have sex unless your health care   provider tells you not to.  Relieving pain and discomfort   Wear a good support bra to relieve breast tenderness.   Take warm sitz baths to soothe any pain or discomfort caused by hemorrhoids. Use hemorrhoid cream if your health care provider approves.   Rest with your legs elevated if you have leg cramps or low back pain.   If you develop varicose veins in your legs, wear support hose. Elevate your feet for 15 minutes, 3-4 times a day. Limit salt in your diet.  Prenatal care   Schedule your prenatal visits by the twelfth week of pregnancy. They are usually scheduled monthly at first, then more often in the last 2 months before delivery.   Write down your questions. Take them to your prenatal visits.   Keep all your prenatal visits as told by your health care provider. This is important.  Safety   Wear your seat belt at all times when driving.   Make a list of emergency phone numbers, including numbers for family, friends, the hospital, and police and fire departments.  General instructions   Ask your health care provider for a referral to a local prenatal education class. Begin classes no later than the beginning of month 6 of your pregnancy.   Ask for help if you have counseling or nutritional needs during pregnancy. Your health care provider can offer advice or refer you to specialists for help  with various needs.   Do not use hot tubs, steam rooms, or saunas.   Do not douche or use tampons or scented sanitary pads.   Do not cross your legs for long periods of time.   Avoid cat litter boxes and soil used by cats. These carry germs that can cause birth defects in the baby and possibly loss of the fetus by miscarriage or stillbirth.   Avoid all smoking, herbs, alcohol, and medicines not prescribed by your health care provider. Chemicals in these products affect the formation and growth of the baby.   Do not use any products that contain nicotine or tobacco, such as cigarettes and e-cigarettes. If you need help quitting, ask your health care provider. You may receive counseling support and other resources to help you quit.   Schedule a dentist appointment. At home, brush your teeth with a soft toothbrush and be gentle when you floss.  Contact a health care provider if:   You have dizziness.   You have mild pelvic cramps, pelvic pressure, or nagging pain in the abdominal area.   You have persistent nausea, vomiting, or diarrhea.   You have a bad smelling vaginal discharge.   You have pain when you urinate.   You notice increased swelling in your face, hands, legs, or ankles.   You are exposed to fifth disease or chickenpox.   You are exposed to German measles (rubella) and have never had it.  Get help right away if:   You have a fever.   You are leaking fluid from your vagina.   You have spotting or bleeding from your vagina.   You have severe abdominal cramping or pain.   You have rapid weight gain or loss.   You vomit blood or material that looks like coffee grounds.   You develop a severe headache.   You have shortness of breath.   You have any kind of trauma, such as from a fall or a car accident.  Summary   The first trimester of pregnancy is from week 1 until   the end of week 13 (months 1 through 3).   Your body goes through many changes during pregnancy. The changes vary from  woman to woman.   You will have routine prenatal visits. During those visits, your health care provider will examine you, discuss any test results you may have, and talk with you about how you are feeling.  This information is not intended to replace advice given to you by your health care provider. Make sure you discuss any questions you have with your health care provider.  Document Released: 09/20/2001 Document Revised: 09/07/2016 Document Reviewed: 09/07/2016  Elsevier Interactive Patient Education  2019 [employer]

## 2018-11-01 ENCOUNTER — Other Ambulatory Visit: Payer: Self-pay | Admitting: Adult Health

## 2018-11-01 ENCOUNTER — Ambulatory Visit (INDEPENDENT_AMBULATORY_CARE_PROVIDER_SITE_OTHER): Payer: 59

## 2018-11-01 DIAGNOSIS — O3680X0 Pregnancy with inconclusive fetal viability, not applicable or unspecified: Secondary | ICD-10-CM

## 2018-11-01 NOTE — Progress Notes (Signed)
[place] 7+1 wks,single IUP w/ys,positive fht 123 bpm,normal ovaries bilat,crl 9.55 mm

## 2018-11-06 ENCOUNTER — Ambulatory Visit: Payer: 59 | Admitting: Family Medicine

## 2018-11-16 ENCOUNTER — Ambulatory Visit: Payer: 59 | Admitting: *Deleted

## 2018-11-16 ENCOUNTER — Encounter: Payer: 59 | Admitting: Women's Health

## 2018-11-20 ENCOUNTER — Other Ambulatory Visit: Payer: Self-pay

## 2018-11-20 ENCOUNTER — Encounter: Payer: Self-pay | Admitting: Women's Health

## 2018-11-20 ENCOUNTER — Ambulatory Visit (INDEPENDENT_AMBULATORY_CARE_PROVIDER_SITE_OTHER): Payer: 59 | Admitting: Women's Health

## 2018-11-20 ENCOUNTER — Ambulatory Visit: Payer: 59 | Admitting: *Deleted

## 2018-11-20 VITALS — BP 140/90 | HR 83 | Wt 212.0 lb

## 2018-11-20 DIAGNOSIS — O09299 Supervision of pregnancy with other poor reproductive or obstetric history, unspecified trimester: Secondary | ICD-10-CM | POA: Insufficient documentation

## 2018-11-20 DIAGNOSIS — Z3490 Encounter for supervision of normal pregnancy, unspecified, unspecified trimester: Secondary | ICD-10-CM

## 2018-11-20 DIAGNOSIS — G935 Compression of brain: Secondary | ICD-10-CM

## 2018-11-20 DIAGNOSIS — Z3682 Encounter for antenatal screening for nuchal translucency: Secondary | ICD-10-CM

## 2018-11-20 DIAGNOSIS — Z1389 Encounter for screening for other disorder: Secondary | ICD-10-CM

## 2018-11-20 DIAGNOSIS — O0990 Supervision of high risk pregnancy, unspecified, unspecified trimester: Secondary | ICD-10-CM | POA: Insufficient documentation

## 2018-11-20 DIAGNOSIS — Z862 Personal history of diseases of the blood and blood-forming organs and certain disorders involving the immune mechanism: Secondary | ICD-10-CM

## 2018-11-20 DIAGNOSIS — Z3481 Encounter for supervision of other normal pregnancy, first trimester: Secondary | ICD-10-CM

## 2018-11-20 DIAGNOSIS — R030 Elevated blood-pressure reading, without diagnosis of hypertension: Secondary | ICD-10-CM

## 2018-11-20 DIAGNOSIS — Z8759 Personal history of other complications of pregnancy, childbirth and the puerperium: Secondary | ICD-10-CM | POA: Insufficient documentation

## 2018-11-20 DIAGNOSIS — Z331 Pregnant state, incidental: Secondary | ICD-10-CM

## 2018-11-20 DIAGNOSIS — Z3A09 9 weeks gestation of pregnancy: Secondary | ICD-10-CM

## 2018-11-20 LAB — POCT URINALYSIS DIPSTICK OB
GLUCOSE, UA: NEGATIVE
KETONES UA: NEGATIVE
Leukocytes, UA: NEGATIVE
Nitrite, UA: NEGATIVE

## 2018-11-20 NOTE — Progress Notes (Signed)
INITIAL OBSTETRICAL VISIT Patient name: Janet [name] MRN [id number]  Date of birth: May 23, 1986 Chief Complaint:   Initial Prenatal Visit (nausea)  History of Present Illness:   Janet [name] is a 33 y.o. G53P1001 African American female at [redacted]w[redacted]d by LMP c/w 7wk u/s, with an Estimated Date of Delivery: 06/19/19 being seen today for her initial obstetrical visit.   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   Chiari malformation, last MRI 2017-stable, was able to have epidural Today she reports nausea, occ vomiting- declines meds.  Patient's last menstrual period was 09/12/2018. Last pap 10/26/17. Results were: normal Review of Systems:   Pertinent items are noted in HPI Denies cramping/contractions, leakage of fluid, vaginal bleeding, abnormal vaginal discharge w/ itching/odor/irritation, headaches, visual changes, shortness of breath, chest pain, abdominal pain, severe nausea/vomiting, or problems with urination or bowel movements unless otherwise stated above.  Pertinent History Reviewed:  Reviewed past medical,surgical, social, obstetrical and family history.  Reviewed problem list, medications and allergies. OB History  Gravida Para Term Preterm AB Living  2 1 1  0 0 1  SAB TAB Ectopic Multiple Live Births  0 0 0 0 1    # Outcome Date GA Lbr Len/2nd Weight Sex Delivery Anes PTL Lv  2 Current           1 Term 08/01/16 [redacted]w[redacted]d 10:48 / 05:09 7 lb 8.6 oz (3.42 kg) F Vag-Spont EPI N LIV     Complications: Shoulder Dystocia   Physical Assessment:   Vitals:   11/20/18 1531 11/20/18 1532  BP: (!) 144/99 140/90  Pulse: 83   Weight: 212 lb (96.2 kg)   Body mass index is 36.97 kg/m.       Physical Examination:  General appearance  - well appearing, and in no distress  Mental status - alert, oriented to person, place, and time  Psych:  She has a normal mood and affect  Skin - warm and dry, normal color, no suspicious lesions noted  Chest - effort normal, all lung fields clear to auscultation bilaterally  Heart - normal rate and regular rhythm  Abdomen - soft, nontender  Extremities:  No swelling or varicosities noted  Thin prep pap is not done   Fetal Heart Rate (bpm): +u/s via informal transabdominal u/s  Results for orders placed or performed in visit on 11/20/18 (from the past 24 hour(s))  POC Urinalysis Dipstick OB   Collection Time: 11/20/18  3:50 PM  Result Value Ref Range   Color, UA     Clarity, UA     Glucose, UA Negative Negative   Bilirubin, UA     Ketones, UA neg    Spec Grav, UA     Blood, UA moderate    pH, UA     POC,PROTEIN,UA Trace Negative, Trace, Small (1+), Moderate (2+), Large (3+), 4+   Urobilinogen, UA     Nitrite, UA neg    Leukocytes, UA Negative Negative   Appearance     Odor      Assessment & Plan:  1) Low-Risk Pregnancy G2P1001 at [redacted]w[redacted]d with an Estimated Date of Delivery: 06/19/19   2) Initial OB visit  3) H/O pre-e & PPHTN> BP elevated today, to start checking at home QID, keep log, bring  to appt, ASA 162mg  @ 12wks, baseline labs today  4) H/O mild shoulder dystocia>baby 7lb 8.6oz  5) H/O mild PPH  6) Chiari malformation> last MRI 2017 stable  Meds: No orders of the defined types were placed in this encounter.   Initial labs obtained Continue prenatal vitamins Reviewed n/v relief measures and warning s/s to report Reviewed recommended weight gain based on pre-gravid BMI Encouraged well-balanced diet Genetic Screening discussed Integrated Screen: requested Cystic fibrosis screening discussed declined Ultrasound discussed; fetal survey: requested CCNC completed>not applying for preg mcaid  Follow-up: Return in about 22 days (around 12/12/2018) for LROB, US:NT+1stIT.    Orders Placed This Encounter  Procedures  . GC/Chlamydia Probe Amp  . Urine Culture  . US Fetal Nuchal Translucency Measurement  . Obstetric Panel, Including HIV  . Urinalysis, Routine w reflex microscopic  . Pain Management Screening Profile (10S)  . Comprehensive metabolic panel  . Protein / creatinine ratio, urine  . POC Urinalysis Dipstick OB    Roma Schanz CNM, [facility] 11/20/2018 4:25 PM

## 2018-11-20 NOTE — Patient Instructions (Addendum)
Janet [name], I greatly value your feedback.  If you receive a survey following your visit with [place] today, we appreciate you taking the time to fill it out.  Thanks, [name], CNM, WHNP-BC  Begin taking 162mg  (two 81mg  tablets) baby aspirin daily at 12 weeks of pregnancy (2/26) to decrease risk of preeclampsia during pregnancy    Check your blood pressure 4 times daily and keep a log, bring this with you to all appointments.  If blood pressure is >=160 on top or >=110 on bottom, check again in 5 minutes, if still this high, call us   Nausea & Vomiting  Have saltine crackers or pretzels by your bed and eat a few bites before you raise your head out of bed in the morning  Eat small frequent meals throughout the day instead of large meals  Drink plenty of fluids throughout the day to stay hydrated, just don't drink a lot of fluids with your meals.  This can make your stomach fill up faster making you feel sick  Do not brush your teeth right after you eat  Products with real ginger are good for nausea, like ginger ale and ginger hard candy Make sure it says made with real ginger!  Sucking on sour candy like lemon heads is also good for nausea  If your prenatal vitamins make you nauseated, take them at night so you will sleep through the nausea  Sea Bands  If you feel like you need medicine for the nausea & vomiting please let us know  If you are unable to keep any fluids or food down please let us know   Constipation  Drink plenty of fluid, preferably water, throughout the day  Eat foods high in fiber such as fruits, vegetables, and grains  Exercise, such as walking, is a good way to keep your bowels regular  Drink warm fluids, especially warm prune juice, or decaf coffee  Eat a 1/2 cup of real oatmeal (not instant), 1/2 cup applesauce, and 1/2-1 cup warm prune juice every day  If needed, you may take Colace (docusate sodium) stool softener once or twice a day to help  keep the stool soft. If you are pregnant, wait until you are out of your first trimester (12-14 weeks of pregnancy)  If you still are having problems with constipation, you may take Miralax once daily as needed to help keep your bowels regular.  If you are pregnant, wait until you are out of your first trimester (12-14 weeks of pregnancy)   First Trimester of Pregnancy The first trimester of pregnancy is from week 1 until the end of week 12 (months 1 through 3). A week after a sperm fertilizes an egg, the egg will implant on the wall of the uterus. This embryo will begin to develop into a baby. Genes from you and your partner are forming the baby. The female genes determine whether the baby is a boy or a girl. At 6-8 weeks, the eyes and face are formed, and the heartbeat can be seen on ultrasound. At the end of 12 weeks, all the baby's organs are formed.  Now that you are pregnant, you will want to do everything you can to have a healthy baby. Two of the most important things are to get good prenatal care and to follow your health care provider's instructions. Prenatal care is all the medical care you receive before the baby's birth. This care will help prevent, find, and treat any problems during  the pregnancy and childbirth. BODY CHANGES Your body goes through many changes during pregnancy. The changes vary from woman to woman.   You may gain or lose a couple of pounds at first.  You may feel sick to your stomach (nauseous) and throw up (vomit). If the vomiting is uncontrollable, call your health care provider.  You may tire easily.  You may develop headaches that can be relieved by medicines approved by your health care provider.  You may urinate more often. Painful urination may mean you have a bladder infection.  You may develop heartburn as a result of your pregnancy.  You may develop constipation because certain hormones are causing the muscles that push waste through your intestines to  slow down.  You may develop hemorrhoids or swollen, bulging veins (varicose veins).  Your breasts may begin to grow larger and become tender. Your nipples may stick out more, and the tissue that surrounds them (areola) may become darker.  Your gums may bleed and may be sensitive to brushing and flossing.  Dark spots or blotches (chloasma, mask of pregnancy) may develop on your face. This will likely fade after the baby is born.  Your menstrual periods will stop.  You may have a loss of appetite.  You may develop cravings for certain kinds of food.  You may have changes in your emotions from day to day, such as being excited to be pregnant or being concerned that something may go wrong with the pregnancy and baby.  You may have more vivid and strange dreams.  You may have changes in your hair. These can include thickening of your hair, rapid growth, and changes in texture. Some women also have hair loss during or after pregnancy, or hair that feels dry or thin. Your hair will most likely return to normal after your baby is born. WHAT TO EXPECT AT YOUR PRENATAL VISITS During a routine prenatal visit:  You will be weighed to make sure you and the baby are growing normally.  Your blood pressure will be taken.  Your abdomen will be measured to track your baby's growth.  The fetal heartbeat will be listened to starting around week 10 or 12 of your pregnancy.  Test results from any previous visits will be discussed. Your health care provider may ask you:  How you are feeling.  If you are feeling the baby move.  If you have had any abnormal symptoms, such as leaking fluid, bleeding, severe headaches, or abdominal cramping.  If you have any questions. Other tests that may be performed during your first trimester include:  Blood tests to find your blood type and to check for the presence of any previous infections. They will also be used to check for low iron levels (anemia) and Rh  antibodies. Later in the pregnancy, blood tests for diabetes will be done along with other tests if problems develop.  Urine tests to check for infections, diabetes, or protein in the urine.  An ultrasound to confirm the proper growth and development of the baby.  An amniocentesis to check for possible genetic problems.  Fetal screens for spina bifida and Down syndrome.  You may need other tests to make sure you and the baby are doing well. HOME CARE INSTRUCTIONS  Medicines  Follow your health care provider's instructions regarding medicine use. Specific medicines may be either safe or unsafe to take during pregnancy.  Take your prenatal vitamins as directed.  If you develop constipation, try taking a stool softener  if your health care provider approves. Diet  Eat regular, well-balanced meals. Choose a variety of foods, such as meat or vegetable-based protein, fish, milk and low-fat dairy products, vegetables, fruits, and whole grain breads and cereals. Your health care provider will help you determine the amount of weight gain that is right for you.  Avoid raw meat and uncooked cheese. These carry germs that can cause birth defects in the baby.  Eating four or five small meals rather than three large meals a day may help relieve nausea and vomiting. If you start to feel nauseous, eating a few soda crackers can be helpful. Drinking liquids between meals instead of during meals also seems to help nausea and vomiting.  If you develop constipation, eat more high-fiber foods, such as fresh vegetables or fruit and whole grains. Drink enough fluids to keep your urine clear or pale yellow. Activity and Exercise  Exercise only as directed by your health care provider. Exercising will help you:  Control your weight.  Stay in shape.  Be prepared for labor and delivery.  Experiencing pain or cramping in the lower abdomen or low back is a good sign that you should stop exercising. Check  with your health care provider before continuing normal exercises.  Try to avoid standing for long periods of time. Move your legs often if you must stand in one place for a long time.  Avoid heavy lifting.  Wear low-heeled shoes, and practice good posture.  You may continue to have sex unless your health care provider directs you otherwise. Relief of Pain or Discomfort  Wear a good support bra for breast tenderness.   Take warm sitz baths to soothe any pain or discomfort caused by hemorrhoids. Use hemorrhoid cream if your health care provider approves.   Rest with your legs elevated if you have leg cramps or low back pain.  If you develop varicose veins in your legs, wear support hose. Elevate your feet for 15 minutes, 3-4 times a day. Limit salt in your diet. Prenatal Care  Schedule your prenatal visits by the twelfth week of pregnancy. They are usually scheduled monthly at first, then more often in the last 2 months before delivery.  Write down your questions. Take them to your prenatal visits.  Keep all your prenatal visits as directed by your health care provider. Safety  Wear your seat belt at all times when driving.  Make a list of emergency phone numbers, including numbers for family, friends, the hospital, and police and fire departments. General Tips  Ask your health care provider for a referral to a local prenatal education class. Begin classes no later than at the beginning of month 6 of your pregnancy.  Ask for help if you have counseling or nutritional needs during pregnancy. Your health care provider can offer advice or refer you to specialists for help with various needs.  Do not use hot tubs, steam rooms, or saunas.  Do not douche or use tampons or scented sanitary pads.  Do not cross your legs for long periods of time.  Avoid cat litter boxes and soil used by cats. These carry germs that can cause birth defects in the baby and possibly loss of the fetus  by miscarriage or stillbirth.  Avoid all smoking, herbs, alcohol, and medicines not prescribed by your health care provider. Chemicals in these affect the formation and growth of the baby.  Schedule a dentist appointment. At home, brush your teeth with a soft toothbrush and be  gentle when you floss. SEEK MEDICAL CARE IF:   You have dizziness.  You have mild pelvic cramps, pelvic pressure, or nagging pain in the abdominal area.  You have persistent nausea, vomiting, or diarrhea.  You have a bad smelling vaginal discharge.  You have pain with urination.  You notice increased swelling in your face, hands, legs, or ankles. SEEK IMMEDIATE MEDICAL CARE IF:   You have a fever.  You are leaking fluid from your vagina.  You have spotting or bleeding from your vagina.  You have severe abdominal cramping or pain.  You have rapid weight gain or loss.  You vomit blood or material that looks like coffee grounds.  You are exposed to [place] measles and have never had them.  You are exposed to fifth disease or chickenpox.  You develop a severe headache.  You have shortness of breath.  You have any kind of trauma, such as from a fall or a car accident. Document Released: 09/20/2001 Document Revised: 02/10/2014 Document Reviewed: 08/06/2013 [facility] Patient Information 2015 [place], [place]. This information is not intended to replace advice given to you by your health care provider. Make sure you discuss any questions you have with your health care provider.

## 2018-11-21 LAB — OBSTETRIC PANEL, INCLUDING HIV
Antibody Screen: NEGATIVE
Basophils Absolute: 0 10*3/uL (ref 0.0–0.2)
Basos: 0 %
EOS (ABSOLUTE): 0.1 10*3/uL (ref 0.0–0.4)
EOS: 2 %
HEMATOCRIT: 35.2 % (ref 34.0–46.6)
HEMOGLOBIN: 12.3 g/dL (ref 11.1–15.9)
HIV SCREEN 4TH GENERATION: NONREACTIVE
Hepatitis B Surface Ag: NEGATIVE
IMMATURE GRANULOCYTES: 0 %
Immature Grans (Abs): 0 10*3/uL (ref 0.0–0.1)
LYMPHS ABS: 2.2 10*3/uL (ref 0.7–3.1)
Lymphs: 28 %
MCH: 28.2 pg (ref 26.6–33.0)
MCHC: 34.9 g/dL (ref 31.5–35.7)
MCV: 81 fL (ref 79–97)
Monocytes Absolute: 0.7 10*3/uL (ref 0.1–0.9)
Monocytes: 8 %
NEUTROS ABS: 4.9 10*3/uL (ref 1.4–7.0)
Neutrophils: 62 %
Platelets: 377 10*3/uL (ref 150–450)
RBC: 4.36 x10E6/uL (ref 3.77–5.28)
RDW: 13.9 % (ref 11.7–15.4)
RH TYPE: POSITIVE
RPR: NONREACTIVE
Rubella Antibodies, IGG: 8.78 index (ref >0.99)
WBC: 8 10*3/uL (ref 3.4–10.8)

## 2018-11-21 LAB — COMPREHENSIVE METABOLIC PANEL
ALBUMIN: 3.8 g/dL (ref 3.8–4.8)
ALT: 13 IU/L (ref 0–32)
AST: 11 IU/L (ref 0–40)
Albumin/Globulin Ratio: 1.4 (ref 1.2–2.2)
Alkaline Phosphatase: 59 IU/L (ref 39–117)
BUN / CREAT RATIO: 19 (ref 9–23)
BUN: 10 mg/dL (ref 6–20)
CALCIUM: 9.4 mg/dL (ref 8.7–10.2)
CHLORIDE: 103 mmol/L (ref 96–106)
CO2: 22 mmol/L (ref 20–29)
CREATININE: 0.54 mg/dL — AB (ref 0.57–1.00)
GFR, EST AFRICAN AMERICAN: 143 mL/min/{1.73_m2} (ref >59)
GFR, EST NON AFRICAN AMERICAN: 124 mL/min/{1.73_m2} (ref >59)
GLUCOSE: 96 mg/dL (ref 65–99)
Globulin, Total: 2.8 g/dL (ref 1.5–4.5)
Potassium: 4.2 mmol/L (ref 3.5–5.2)
Sodium: 139 mmol/L (ref 134–144)
Total Protein: 6.6 g/dL (ref 6.0–8.5)

## 2018-11-21 LAB — URINALYSIS, ROUTINE W REFLEX MICROSCOPIC
Bilirubin, UA: NEGATIVE
Glucose, UA: NEGATIVE
Ketones, UA: NEGATIVE
Leukocytes, UA: NEGATIVE
Nitrite, UA: NEGATIVE
PH UA: 5.5 (ref 5.0–7.5)
Specific Gravity, UA: >=1.03 — AB (ref 1.005–1.030)
UUROB: 0.2 mg/dL (ref 0.2–1.0)

## 2018-11-21 LAB — MICROSCOPIC EXAMINATION: CASTS: NONE SEEN /LPF

## 2018-11-21 LAB — PROTEIN / CREATININE RATIO, URINE
Creatinine, Urine: 263.5 mg/dL
PROTEIN/CREAT RATIO: 124 mg/g{creat} (ref 0–200)
Protein, Ur: 32.7 mg/dL

## 2018-11-22 ENCOUNTER — Ambulatory Visit: Payer: 59 | Admitting: Family Medicine

## 2018-11-22 LAB — PMP SCREEN PROFILE (10S), URINE
AMPHETAMINE SCREEN URINE: NEGATIVE ng/mL
BARBITURATE SCREEN URINE: NEGATIVE ng/mL
BENZODIAZEPINE SCREEN, URINE: NEGATIVE ng/mL
CANNABINOIDS UR QL SCN: NEGATIVE ng/mL
COCAINE(METAB.)SCREEN, URINE: NEGATIVE ng/mL
CREATININE(CRT), U: 269.8 mg/dL (ref 20.0–300.0)
METHADONE SCREEN, URINE: NEGATIVE ng/mL
OPIATE SCREEN URINE: NEGATIVE ng/mL
OXYCODONE+OXYMORPHONE UR QL SCN: NEGATIVE ng/mL
PROPOXYPHENE SCREEN URINE: NEGATIVE ng/mL
Ph of Urine: 6.1 (ref 4.5–8.9)
Phencyclidine Qn, Ur: NEGATIVE ng/mL

## 2018-11-22 LAB — URINE CULTURE

## 2018-11-23 LAB — GC/CHLAMYDIA PROBE AMP
Chlamydia trachomatis, NAA: NEGATIVE
Neisseria gonorrhoeae by PCR: NEGATIVE

## 2018-12-12 ENCOUNTER — Ambulatory Visit (INDEPENDENT_AMBULATORY_CARE_PROVIDER_SITE_OTHER): Payer: 59 | Admitting: Women's Health

## 2018-12-12 ENCOUNTER — Encounter: Payer: Self-pay | Admitting: Women's Health

## 2018-12-12 ENCOUNTER — Ambulatory Visit (INDEPENDENT_AMBULATORY_CARE_PROVIDER_SITE_OTHER): Payer: 59

## 2018-12-12 VITALS — BP 121/77 | HR 85 | Wt 217.4 lb

## 2018-12-12 DIAGNOSIS — Z3682 Encounter for antenatal screening for nuchal translucency: Secondary | ICD-10-CM | POA: Diagnosis not present

## 2018-12-12 DIAGNOSIS — Z1389 Encounter for screening for other disorder: Secondary | ICD-10-CM

## 2018-12-12 DIAGNOSIS — Z3481 Encounter for supervision of other normal pregnancy, first trimester: Secondary | ICD-10-CM

## 2018-12-12 DIAGNOSIS — Z3A13 13 weeks gestation of pregnancy: Secondary | ICD-10-CM

## 2018-12-12 DIAGNOSIS — Z331 Pregnant state, incidental: Secondary | ICD-10-CM

## 2018-12-12 DIAGNOSIS — Z1379 Encounter for other screening for genetic and chromosomal anomalies: Secondary | ICD-10-CM

## 2018-12-12 LAB — POCT URINALYSIS DIPSTICK OB
Blood, UA: NEGATIVE
GLUCOSE, UA: NEGATIVE
KETONES UA: NEGATIVE
Leukocytes, UA: NEGATIVE
Nitrite, UA: NEGATIVE

## 2018-12-12 NOTE — Progress Notes (Signed)
[place] 13 wks,measurements c/w dates,normal ovaries bilat,crl 64.62 mm,fhr 150 bpm,unable to obtain NT or visualized NB because of fetal position and pt body habitus

## 2018-12-12 NOTE — Patient Instructions (Addendum)
Janet [name], I greatly value your feedback.  If you receive a survey following your visit with [place] today, we appreciate you taking the time to fill it out.  Thanks, [name], CNM, [facility]  [place]!!! It is now [place] [facility] ([place], [place] [address]) Entrance located off of [address] parking  Begin taking 162mg  (two 81mg  tablets) baby aspirin daily to decrease risk of preeclampsia during pregnancy       Second Trimester of Pregnancy The second trimester is from week 14 through week 27 (months 4 through 6). The second trimester is often a time when you feel your best. Your body has adjusted to being pregnant, and you begin to feel better physically. Usually, morning sickness has lessened or quit completely, you may have more energy, and you may have an increase in appetite. The second trimester is also a time when the fetus is growing rapidly. At the end of the sixth month, the fetus is about 9 inches long and weighs about 1 pounds. You will likely begin to feel the baby move (quickening) between 16 and 20 weeks of pregnancy. Body changes during your second trimester Your body continues to go through many changes during your second trimester. The changes vary from woman to woman.  Your weight will continue to increase. You will notice your lower abdomen bulging out.  You may begin to get stretch marks on your hips, abdomen, and breasts.  You may develop headaches that can be relieved by medicines. The medicines should be approved by your health care provider.  You may urinate more often because the fetus is pressing on your bladder.  You may develop or continue to have heartburn as a result of your pregnancy.  You may develop constipation because certain hormones are causing the muscles that push waste through your intestines to slow down.  You may develop hemorrhoids or swollen, bulging veins  (varicose veins).  You may have back pain. This is caused by: ? Weight gain. ? Pregnancy hormones that are relaxing the joints in your pelvis. ? A shift in weight and the muscles that support your balance.  Your breasts will continue to grow and they will continue to become tender.  Your gums may bleed and may be sensitive to brushing and flossing.  Dark spots or blotches (chloasma, mask of pregnancy) may develop on your face. This will likely fade after the baby is born.  A dark line from your belly button to the pubic area (linea nigra) may appear. This will likely fade after the baby is born.  You may have changes in your hair. These can include thickening of your hair, rapid growth, and changes in texture. Some women also have hair loss during or after pregnancy, or hair that feels dry or thin. Your hair will most likely return to normal after your baby is born.  What to expect at prenatal visits During a routine prenatal visit:  You will be weighed to make sure you and the fetus are growing normally.  Your blood pressure will be taken.  Your abdomen will be measured to track your baby's growth.  The fetal heartbeat will be listened to.  Any test results from the previous visit will be discussed.  Your health care provider may ask you:  How you are feeling.  If you are feeling the baby move.  If you have had any abnormal symptoms, such as  leaking fluid, bleeding, severe headaches, or abdominal cramping.  If you are using any tobacco products, including cigarettes, chewing tobacco, and electronic cigarettes.  If you have any questions.  Other tests that may be performed during your second trimester include:  Blood tests that check for: ? Low iron levels (anemia). ? High blood sugar that affects pregnant women (gestational diabetes) between 52 and 28 weeks. ? Rh antibodies. This is to check for a protein on red blood cells (Rh factor).  Urine tests to check for  infections, diabetes, or protein in the urine.  An ultrasound to confirm the proper growth and development of the baby.  An amniocentesis to check for possible genetic problems.  Fetal screens for spina bifida and Down syndrome.  HIV (human immunodeficiency virus) testing. Routine prenatal testing includes screening for HIV, unless you choose not to have this test.  Follow these instructions at home: Medicines  Follow your health care provider's instructions regarding medicine use. Specific medicines may be either safe or unsafe to take during pregnancy.  Take a prenatal vitamin that contains at least 600 micrograms (mcg) of folic acid.  If you develop constipation, try taking a stool softener if your health care provider approves. Eating and drinking  Eat a balanced diet that includes fresh fruits and vegetables, whole grains, good sources of protein such as meat, eggs, or tofu, and low-fat dairy. Your health care provider will help you determine the amount of weight gain that is right for you.  Avoid raw meat and uncooked cheese. These carry germs that can cause birth defects in the baby.  If you have low calcium intake from food, talk to your health care provider about whether you should take a daily calcium supplement.  Limit foods that are high in fat and processed sugars, such as fried and sweet foods.  To prevent constipation: ? Drink enough fluid to keep your urine clear or pale yellow. ? Eat foods that are high in fiber, such as fresh fruits and vegetables, whole grains, and beans. Activity  Exercise only as directed by your health care provider. Most women can continue their usual exercise routine during pregnancy. Try to exercise for 30 minutes at least 5 days a week. Stop exercising if you experience uterine contractions.  Avoid heavy lifting, wear low heel shoes, and practice good posture.  A sexual relationship may be continued unless your health care provider  directs you otherwise. Relieving pain and discomfort  Wear a good support bra to prevent discomfort from breast tenderness.  Take warm sitz baths to soothe any pain or discomfort caused by hemorrhoids. Use hemorrhoid cream if your health care provider approves.  Rest with your legs elevated if you have leg cramps or low back pain.  If you develop varicose veins, wear support hose. Elevate your feet for 15 minutes, 3-4 times a day. Limit salt in your diet. Prenatal Care  Write down your questions. Take them to your prenatal visits.  Keep all your prenatal visits as told by your health care provider. This is important. Safety  Wear your seat belt at all times when driving.  Make a list of emergency phone numbers, including numbers for family, friends, the hospital, and police and fire departments. General instructions  Ask your health care provider for a referral to a local prenatal education class. Begin classes no later than the beginning of month 6 of your pregnancy.  Ask for help if you have counseling or nutritional needs during pregnancy. Your health  care provider can offer advice or refer you to specialists for help with various needs.  Do not use hot tubs, steam rooms, or saunas.  Do not douche or use tampons or scented sanitary pads.  Do not cross your legs for long periods of time.  Avoid cat litter boxes and soil used by cats. These carry germs that can cause birth defects in the baby and possibly loss of the fetus by miscarriage or stillbirth.  Avoid all smoking, herbs, alcohol, and unprescribed drugs. Chemicals in these products can affect the formation and growth of the baby.  Do not use any products that contain nicotine or tobacco, such as cigarettes and e-cigarettes. If you need help quitting, ask your health care provider.  Visit your dentist if you have not gone yet during your pregnancy. Use a soft toothbrush to brush your teeth and be gentle when you  floss. Contact a health care provider if:  You have dizziness.  You have mild pelvic cramps, pelvic pressure, or nagging pain in the abdominal area.  You have persistent nausea, vomiting, or diarrhea.  You have a bad smelling vaginal discharge.  You have pain when you urinate. Get help right away if:  You have a fever.  You are leaking fluid from your vagina.  You have spotting or bleeding from your vagina.  You have severe abdominal cramping or pain.  You have rapid weight gain or weight loss.  You have shortness of breath with chest pain.  You notice sudden or extreme swelling of your face, hands, ankles, feet, or legs.  You have not felt your baby move in over an hour.  You have severe headaches that do not go away when you take medicine.  You have vision changes. Summary  The second trimester is from week 14 through week 27 (months 4 through 6). It is also a time when the fetus is growing rapidly.  Your body goes through many changes during pregnancy. The changes vary from woman to woman.  Avoid all smoking, herbs, alcohol, and unprescribed drugs. These chemicals affect the formation and growth your baby.  Do not use any tobacco products, such as cigarettes, chewing tobacco, and e-cigarettes. If you need help quitting, ask your health care provider.  Contact your health care provider if you have any questions. Keep all prenatal visits as told by your health care provider. This is important. This information is not intended to replace advice given to you by your health care provider. Make sure you discuss any questions you have with your health care provider. Document Released: 09/20/2001 Document Revised: 03/03/2016 Document Reviewed: 11/27/2012 Elsevier Interactive Patient Education  2017 [employer].

## 2018-12-12 NOTE — Progress Notes (Signed)
   LOW-RISK PREGNANCY VISIT Patient name: Janet [name] MRN [id number]  Date of birth: 03-23-1986 Chief Complaint:   Routine Prenatal Visit  History of Present Illness:   Janet [name] is a 33 y.o. G68P1001 female at [redacted]w[redacted]d with an Estimated Date of Delivery: 06/19/19 being seen today for ongoing management of a low-risk pregnancy.  Today she reports no complaints. Contractions: Not present. Vag. Bleeding: None.  Movement: Absent. denies leaking of fluid. Review of Systems:   Pertinent items are noted in HPI Denies abnormal vaginal discharge w/ itching/odor/irritation, headaches, visual changes, shortness of breath, chest pain, abdominal pain, severe nausea/vomiting, or problems with urination or bowel movements unless otherwise stated above. Pertinent History Reviewed:  Reviewed past medical,surgical, social, obstetrical and family history.  Reviewed problem list, medications and allergies. Physical Assessment:   Vitals:   12/12/18 1441  BP: 121/77  Pulse: 85  Weight: 217 lb 6.4 oz (98.6 kg)  Body mass index is 37.91 kg/m.        Physical Examination:   General appearance: Well appearing, and in no distress  Mental status: Alert, oriented to person, place, and time  Skin: Warm & dry  Cardiovascular: Normal heart rate noted  Respiratory: Normal respiratory effort, no distress  Abdomen: Soft, gravid, nontender  Pelvic: Cervical exam deferred         Extremities: Edema: None  Fetal Status: Fetal Heart Rate (bpm): 150 u/s   Movement: Absent    [place] 13 wks,measurements c/w dates,normal ovaries bilat,crl 64.62 mm,fhr 150 bpm,unable to obtain NT or visualized NB because of fetal position and pt body habitus.   Results for orders placed or performed in visit on 12/12/18 (from the past 24 hour(s))  POC Urinalysis Dipstick OB   Collection Time: 12/12/18  2:22 PM  Result Value Ref Range   Color, UA     Clarity, UA     Glucose, UA Negative Negative   Bilirubin, UA     Ketones, UA neg     Spec Grav, UA     Blood, UA neg    pH, UA     POC,PROTEIN,UA Trace Negative, Trace, Small (1+), Moderate (2+), Large (3+), 4+   Urobilinogen, UA     Nitrite, UA neg    Leukocytes, UA Negative Negative   Appearance     Odor      Assessment & Plan:  1) Low-risk pregnancy G2P1001 at [redacted]w[redacted]d with an Estimated Date of Delivery: 06/19/19   2) H/O pre-e & PPHTN, hasn't started ASA yet, go ahead and start 162mg  daily today   Meds: No orders of the defined types were placed in this encounter.  Labs/procedures today: nt- unable to obtain, offered NIPT- wants  Plan:  Continue routine obstetrical care   Reviewed: Preterm labor symptoms and general obstetric precautions including but not limited to vaginal bleeding, contractions, leaking of fluid and fetal movement were reviewed in detail with the patient.  All questions were answered  Follow-up: Return in about 3 weeks (around 01/02/2019) for LROB; AFP.  Orders Placed This Encounter  Procedures  . MaterniT 21 plus Core, Blood  . POC Urinalysis Dipstick OB   Roma Schanz CNM, [facility] 12/12/2018 3:05 PM

## 2018-12-17 LAB — MATERNIT 21 PLUS CORE, BLOOD
Chromosome 13: NEGATIVE
Chromosome 18: NEGATIVE
Chromosome 21: NEGATIVE
Y CHROMOSOME: NOT DETECTED

## 2019-01-01 ENCOUNTER — Telehealth: Payer: Self-pay | Admitting: Obstetrics & Gynecology

## 2019-01-01 NOTE — Telephone Encounter (Signed)
Patient called, she has an appointment for tomorrow, want to scheduled for Thursday due to work issues (she is [employer] at [employer], issues due to Covid 19).  She wants to know if she can do a phone visit.    [phone]

## 2019-01-02 ENCOUNTER — Encounter: Payer: Self-pay | Admitting: Obstetrics & Gynecology

## 2019-01-02 ENCOUNTER — Ambulatory Visit (INDEPENDENT_AMBULATORY_CARE_PROVIDER_SITE_OTHER): Payer: 59 | Admitting: Obstetrics & Gynecology

## 2019-01-02 ENCOUNTER — Other Ambulatory Visit: Payer: Self-pay

## 2019-01-02 VITALS — BP 108/58 | HR 49

## 2019-01-02 DIAGNOSIS — Z3481 Encounter for supervision of other normal pregnancy, first trimester: Secondary | ICD-10-CM

## 2019-01-02 DIAGNOSIS — Z3A29 29 weeks gestation of pregnancy: Secondary | ICD-10-CM

## 2019-01-02 NOTE — Progress Notes (Signed)
   TELEHEALTH VIRTUAL OBSTETRICS VISIT ENCOUNTER NOTE  I connected with Janet [name] on 04/04/19 at 10:00 AM EDT by telephone at home and verified that I am speaking with the correct person using two identifiers.   I discussed the limitations, risks, security and privacy concerns of performing an evaluation and management service by telephone and the availability of in person appointments. I also discussed with the patient that there may be a patient responsible charge related to this service. The patient expressed understanding and agreed to proceed.  Subjective:  Janet [name] is a 33 y.o. G2P1001 at [redacted]w[redacted]d being followed for ongoing prenatal care.  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  Patient reports no complaints. Reports fetal movement. Denies any contractions, bleeding or leaking of fluid.   The following portions of the patient's history were reviewed and updated as appropriate: allergies, current medications, past family history, past medical history, past social history, past surgical history and problem list.   Objective:   General:  Alert, oriented and cooperative.   Mental Status: Normal mood and affect perceived. Normal judgment and thought content.  Rest of physical exam deferred due to type of encounter  Assessment and Plan:  Pregnancy: G2P1001 14 weeks 1. Encounter for supervision of other normal pregnancy in first trimester   Preterm labor symptoms and general obstetric  precautions including but not limited to vaginal bleeding, contractions, leaking of fluid and fetal movement were reviewed in detail with the patient.  I discussed the assessment and treatment plan with the patient. The patient was provided an opportunity to ask questions and all were answered. The patient agreed with the plan and demonstrated an understanding of the instructions. The patient was advised to call back or seek an in-person office evaluation/go to [facility] at [facility] for any urgent or concerning symptoms. Please refer to After Visit Summary for other counseling recommendations.   I provided 11 minutes of non-face-to-face time during this encounter.  Return in about 4 weeks (around 01/30/2019) for 20 week sono, LROB.  Future Appointments  Date Time Provider [place]  04/23/2019  2:30 PM Roma Schanz, CNM [facility]    Florian Buff, MD [facility], [place]

## 2019-01-09 ENCOUNTER — Other Ambulatory Visit: Payer: Self-pay | Admitting: Women's Health

## 2019-01-09 MED ORDER — PROMETHAZINE HCL 25 MG PO TABS: 12.5000 mg | ORAL_TABLET | Freq: Four times a day (QID) | ORAL | 0 refills | Status: DC | PRN

## 2019-01-10 ENCOUNTER — Other Ambulatory Visit: Payer: Self-pay

## 2019-01-10 ENCOUNTER — Inpatient Hospital Stay (HOSPITAL_COMMUNITY)
Admission: AD | Admit: 2019-01-10 | Discharge: 2019-01-10 | Disposition: A | Discharge status: Home / Self Care | Payer: 59 | Source: Ambulatory Visit | Attending: Obstetrics & Gynecology | Admitting: Obstetrics & Gynecology

## 2019-01-10 ENCOUNTER — Encounter (HOSPITAL_COMMUNITY): Payer: Self-pay

## 2019-01-10 DIAGNOSIS — O219 Vomiting of pregnancy, unspecified: Secondary | ICD-10-CM | POA: Diagnosis not present

## 2019-01-10 DIAGNOSIS — J029 Acute pharyngitis, unspecified: Secondary | ICD-10-CM | POA: Insufficient documentation

## 2019-01-10 DIAGNOSIS — O9989 Other specified diseases and conditions complicating pregnancy, childbirth and the puerperium: Secondary | ICD-10-CM | POA: Insufficient documentation

## 2019-01-10 DIAGNOSIS — Z79899 Other long term (current) drug therapy: Secondary | ICD-10-CM | POA: Insufficient documentation

## 2019-01-10 DIAGNOSIS — R079 Chest pain, unspecified: Secondary | ICD-10-CM | POA: Diagnosis not present

## 2019-01-10 DIAGNOSIS — O26892 Other specified pregnancy related conditions, second trimester: Secondary | ICD-10-CM | POA: Diagnosis not present

## 2019-01-10 DIAGNOSIS — R7303 Prediabetes: Secondary | ICD-10-CM | POA: Insufficient documentation

## 2019-01-10 DIAGNOSIS — Z7982 Long term (current) use of aspirin: Secondary | ICD-10-CM | POA: Diagnosis not present

## 2019-01-10 DIAGNOSIS — Z3A17 17 weeks gestation of pregnancy: Secondary | ICD-10-CM | POA: Diagnosis not present

## 2019-01-10 DIAGNOSIS — R0789 Other chest pain: Secondary | ICD-10-CM

## 2019-01-10 LAB — CBC
HCT: 34.9 % — ABNORMAL LOW (ref 36.0–46.0)
Hemoglobin: 11.4 g/dL — ABNORMAL LOW (ref 12.0–15.0)
MCH: 27.4 pg (ref 26.0–34.0)
MCHC: 32.7 g/dL (ref 30.0–36.0)
MCV: 83.9 fL (ref 80.0–100.0)
Platelets: 309 10*3/uL (ref 150–400)
RBC: 4.16 MIL/uL (ref 3.87–5.11)
RDW: 13.1 % (ref 11.5–15.5)
WBC: 7.9 10*3/uL (ref 4.0–10.5)
nRBC: 0 % (ref 0.0–0.2)

## 2019-01-10 LAB — URINALYSIS, ROUTINE W REFLEX MICROSCOPIC
Bilirubin Urine: NEGATIVE
Glucose, UA: NEGATIVE mg/dL
Ketones, ur: NEGATIVE mg/dL
Leukocytes,Ua: NEGATIVE
Nitrite: NEGATIVE
Protein, ur: NEGATIVE mg/dL
Specific Gravity, Urine: 1.026 (ref 1.005–1.030)
pH: 5 (ref 5.0–8.0)

## 2019-01-10 NOTE — MAU Provider Note (Signed)
Chief Complaint: Generalized Body Aches and Chest Pain   First Provider Initiated Contact with Patient 01/10/19 0946     SUBJECTIVE HPI: Janet [name] is a 34 y.o. G2P1001 at [redacted]w[redacted]d who presents to Maternity Admissions reporting n/v, sore throat, body aches, decreased fetal movement, & chest pressure.  Reports symptoms of body aches, decreased movement, & n/v started 2 days ago. Has had n/v throughout pregnancy that had improved until Tuesday. Has not vomited today. Has rx for phenergan at home but has not taken it. Was having some dizziness yesterday which has resolved.  Feels chest pressure/discomfort today. Coughing makes worse but states she only coughs when she chokes on water; otherwise denies a cough.  Sore throat that she says is due to pollen & allergies. No rhinorrhea or sneezing. Denies SOB or fever. 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 Denies abdominal pain or vaginal bleeding.   Location: chest & body Quality: aching, sore Severity: 7/10 on pain scale Duration: 1 day Timing: intermittent Modifying factors: none Associated signs and symptoms: n/v, sore throat  Past Medical History:  Diagnosis Date  . 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 Carpal tunnel syndrome during pregnancy   . 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 Cushing's syndrome ([place]) 11/27/2013  . Gastroesophageal reflux disease   . Hematuria 09/18/2015  . Menstrual periods irregular 09/18/2015  . Migraine without aura, with intractable migraine, so stated, without mention of status migrainosus 11/11/2013  . Patient desires pregnancy 11/27/2013  . Pregnant 12/16/2015  . Vitamin D deficiency    OB History  Gravida Para Term Preterm AB Living  2 1 1  0 0 1  SAB TAB Ectopic Multiple Live Births  0 0 0 0 1    # Outcome Date GA Lbr Len/2nd Weight Sex  Delivery Anes PTL Lv  2 Current           1 Term 08/01/16 [redacted]w[redacted]d 10:48 / 05:09 3420 g F Vag-Spont EPI N LIV     Complications: Shoulder Dystocia   Past Surgical History:  Procedure Laterality Date  . CHOLECYSTECTOMY  02/2015  . COLONOSCOPY N/A 04/19/2013   PTW:SFKCLE mucosa in the terminal ileum/Single erosion in transverse colon/RECTAL BLEEDING DUE TO Moderate sized internal hemorrhoids/ trv colon erosion, bx benign.  . ESOPHAGOGASTRODUODENOSCOPY N/A 04/19/2013   XNT:ZGYF Non-erosive gastritis/PERI-UMBILICAL PAIN DUE TO GERD, GASTRITIS, AND CONSTIPATION. Bx with mild chronic inactive gastritis. No H.Pylori  . HEMORRHOID BANDING  2015   Dr. Gala Romney- in office banding procedure  . TONSILLECTOMY    . 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 Marital status: Single    Spouse name: Not on file  . Number of children: 1  . Years of education: BA  . Highest education level: Not on file  Occupational History    Employer: [employer]  . Financial resource strain: Not on file  . Food insecurity:    Worry: Not on file    Inability: Not on file  . Transportation needs:    Medical: Not on file    Non-medical: Not on file  Tobacco Use  . Smoking status: Never Smoker  . Smokeless tobacco: Never Used  Substance and Sexual Activity  . Alcohol use: No  . Drug use: No  .  Sexual activity: Yes    Birth control/protection: None  Lifestyle  . Physical activity:    Days per week: Not on file    Minutes per session: Not on file  . Stress: Not on file  Relationships  . Social connections:    Talks on phone: Not on file    Gets together: Not on file    Attends religious service: Not on file    Active member of club or organization: Not on file    Attends meetings of clubs or organizations: Not on file    Relationship status: Not on file  . Intimate partner violence:    Fear of current or ex partner: Not on file    Emotionally abused: Not  on file    Physically abused: Not on file    Forced sexual activity: Not on file  Other Topics Concern  . Not on file  Social History Narrative   Denies caffeine use    Family History  Problem Relation Age of Onset  . Hypertension Mother   . Hyperlipidemia Mother   . Hypertension Father   . Hypertension Maternal Grandmother   . Diabetes Maternal Grandmother   . Diabetes Paternal Grandmother   . COPD Paternal Grandfather   . Heart disease Paternal Grandfather   . Colon cancer Other        maternal great uncle  . Stroke Maternal Aunt   . Migraines Maternal Aunt    No current facility-administered medications on file prior to encounter.    Current Outpatient Medications on File Prior to Encounter  Medication Sig Dispense Refill  . aspirin 81 MG chewable tablet Chew 162 mg by mouth daily.    . prenatal vitamin w/FE, FA (PRENATAL 1 + 1) 27-1 MG TABS tablet Take 1 tablet by mouth daily at 12 noon. 30 each 12  . promethazine (PHENERGAN) 25 MG tablet Take 0.5-1 tablets (12.5-25 mg total) by mouth every 6 (six) hours as needed for nausea or vomiting. 30 tablet 0   Allergies  Allergen Reactions  . Methylprednisolone Shortness Of Breath, Itching and Other (See Comments)    Reaction:  Bruising   . Penicillins Anaphylaxis, Rash and Other (See Comments)    Has patient had a PCN reaction causing immediate rash, facial/tongue/throat swelling, SOB or lightheadedness with hypotension: No Has patient had a PCN reaction causing severe rash involving mucus membranes or skin necrosis: No Has patient had a PCN reaction that required hospitalization No Has patient had a PCN reaction occurring within the last 10 years: No If all of the above answers are "NO", then may proceed with Cephalosporin use.  . Latex Rash  . Neomycin Rash    Breaks out with neosporin    I have reviewed patient's Past Medical Hx, Surgical Hx, Family Hx, Social Hx, medications and allergies.   Review of Systems   Constitutional: Positive for chills. Negative for fever.  HENT: Positive for postnasal drip and sore throat. Negative for ear pain, rhinorrhea and sneezing.   Respiratory: Positive for chest tightness. Negative for cough and shortness of breath.   Cardiovascular: Positive for chest pain. Negative for palpitations and leg swelling.  Gastrointestinal: Positive for nausea and vomiting. Negative for abdominal pain.  Genitourinary: Negative.   Musculoskeletal: Positive for myalgias.    OBJECTIVE Patient Vitals for the past 24 hrs:  BP Temp Temp src Pulse Resp SpO2 Height Weight  01/10/19 1216 122/75 98.2 F (36.8 C) Oral 79 16 100 % - -  01/10/19 9604 Marland Kitchen)  151/93 98.3 F (36.8 C) Oral 92 18 100 % 5\' 4"  (1.626 m) 100.2 kg   Constitutional: Well-developed, well-nourished female in no acute distress.  Cardiovascular: normal rate & rhythm, no murmur Respiratory: normal rate and effort. Lung sounds clear throughout GI: Abd soft, non-tender, Pos BS x 4. No guarding or rebound tenderness MS: Extremities nontender, no edema, normal ROM Neurologic: Alert and oriented x 4.     LAB RESULTS Results for orders placed or performed during the hospital encounter of 01/10/19 (from the past 24 hour(s))  Urinalysis, Routine w reflex microscopic     Status: Abnormal   Collection Time: 01/10/19  9:50 AM  Result Value Ref Range   Color, Urine YELLOW YELLOW   APPearance CLEAR CLEAR   Specific Gravity, Urine 1.026 1.005 - 1.030   pH 5.0 5.0 - 8.0   Glucose, UA NEGATIVE NEGATIVE mg/dL   Hgb urine dipstick LARGE (A) NEGATIVE   Bilirubin Urine NEGATIVE NEGATIVE   Ketones, ur NEGATIVE NEGATIVE mg/dL   Protein, ur NEGATIVE NEGATIVE mg/dL   Nitrite NEGATIVE NEGATIVE   Leukocytes,Ua NEGATIVE NEGATIVE   RBC / HPF 21-50 0 - 5 RBC/hpf   WBC, UA 0-5 0 - 5 WBC/hpf   Bacteria, UA FEW (A) NONE SEEN   Squamous Epithelial / LPF 0-5 0 - 5   Mucus PRESENT   CBC     Status: Abnormal   Collection Time: 01/10/19 11:29  AM  Result Value Ref Range   WBC 7.9 4.0 - 10.5 K/uL   RBC 4.16 3.87 - 5.11 MIL/uL   Hemoglobin 11.4 (L) 12.0 - 15.0 g/dL   HCT 34.9 (L) 36.0 - 46.0 %   MCV 83.9 80.0 - 100.0 fL   MCH 27.4 26.0 - 34.0 pg   MCHC 32.7 30.0 - 36.0 g/dL   RDW 13.1 11.5 - 15.5 %   Platelets 309 150 - 400 K/uL   nRBC 0.0 0.0 - 0.2 %    IMAGING No results found.  MAU COURSE Orders Placed This Encounter  Procedures  . Culture, OB Urine  . Urinalysis, Routine w reflex microscopic  . CBC  . ED EKG  . Discharge patient   No orders of the defined types were placed in this encounter.   MDM FHT present via doppler VSS, pt afebrile. Lung sound clear EKG, NSR  Patient w/ URI symptoms vs allergies. Afebrile & stable vital signs. Discussed OTC meds safe in pregnancy & reasons to return including respiratory symptoms, at which time she should present to the ED.   ASSESSMENT 1. Nausea and vomiting during pregnancy prior to [redacted] weeks gestation   2. [redacted] weeks gestation of pregnancy   3. Chest pressure     PLAN Discharge home in stable condition.   Allergies as of 01/10/2019      Reactions   Methylprednisolone Shortness Of Breath, Itching, Other (See Comments)   Reaction:  Bruising    Penicillins Anaphylaxis, Rash, Other (See Comments)   Has patient had a PCN reaction causing immediate rash, facial/tongue/throat swelling, SOB or lightheadedness with hypotension: No Has patient had a PCN reaction causing severe rash involving mucus membranes or skin necrosis: No Has patient had a PCN reaction that required hospitalization No Has patient had a PCN reaction occurring within the last 10 years: No If all of the above answers are "NO", then may proceed with Cephalosporin use.   Latex Rash   Neomycin Rash   Breaks out with neosporin      Medication  List    STOP taking these medications   ergocalciferol 1.25 MG (50000 UT) capsule Commonly known as:  VITAMIN D2   pantoprazole 40 MG tablet Commonly  known as:  PROTONIX     TAKE these medications   aspirin 81 MG chewable tablet Chew 162 mg by mouth daily.   prenatal vitamin w/FE, FA 27-1 MG Tabs tablet Take 1 tablet by mouth daily at 12 noon.   promethazine 25 MG tablet Commonly known as:  PHENERGAN Take 0.5-1 tablets (12.5-25 mg total) by mouth every 6 (six) hours as needed for nausea or vomiting.        Jorje Guild, NP 01/10/2019  2:16 PM

## 2019-01-10 NOTE — MAU Note (Addendum)
Pt states she has been feeling lightheaded, started vomiting on Tuesday, started having chills yesterday, today having body aches & mid chest aching/soreness.  Denies abdominal pain or bleeding.  Hasn't felt baby moving as much as before.  Pt C/O sore throat, no cough.  Denies fever.  No vomiting today, has eaten an orange today.

## 2019-01-10 NOTE — Discharge Instructions (Signed)
Safe Medications in Pregnancy   Acne: Benzoyl Peroxide Salicylic Acid  Backache/Headache: Tylenol: 2 regular strength every 4 hours OR              2 Extra strength every 6 hours  Colds/Coughs/Allergies: Benadryl (alcohol free) 25 mg every 6 hours as needed Breath right strips Claritin Cepacol throat lozenges Chloraseptic throat spray Cold-Eeze- up to three times per day Cough drops, alcohol free Flonase (by prescription only) Guaifenesin Mucinex Robitussin DM (plain only, alcohol free) Saline nasal spray/drops Sudafed (pseudoephedrine) & Actifed ** use only after [redacted] weeks gestation and if you do not have high blood pressure Tylenol Vicks Vaporub Zinc lozenges Zyrtec   Constipation: Colace Ducolax suppositories Fleet enema Glycerin suppositories Metamucil Milk of magnesia Miralax Senokot Smooth move tea  Diarrhea: Kaopectate Imodium A-D  *NO pepto Bismol  Hemorrhoids: Anusol Anusol HC Preparation H Tucks  Indigestion: Tums Maalox Mylanta Zantac  Pepcid  Insomnia: Benadryl (alcohol free) 25mg  every 6 hours as needed Tylenol PM Unisom, no Gelcaps  Leg Cramps: Tums MagGel  Nausea/Vomiting:  Bonine Dramamine Emetrol Ginger extract Sea bands Meclizine  Nausea medication to take during pregnancy:  Unisom (doxylamine succinate 25 mg tablets) Take one tablet daily at bedtime. If symptoms are not adequately controlled, the dose can be increased to a maximum recommended dose of two tablets daily (1/2 tablet in the morning, 1/2 tablet mid-afternoon and one at bedtime). Vitamin B6 100mg  tablets. Take one tablet twice a day (up to 200 mg per day).  Skin Rashes: Aveeno products Benadryl cream or 25mg  every 6 hours as needed Calamine Lotion 1% cortisone cream  Yeast infection: Gyne-lotrimin 7 Monistat 7  Gum/tooth pain: Anbesol  **If taking multiple medications, please check labels to avoid duplicating the same active ingredients **take  medication as directed on the label ** Do not exceed 4000 mg of tylenol in 24 hours **Do not take medications that contain aspirin or ibuprofen       Coronavirus (COVID-19) Are you at risk?  Are you at risk for the Coronavirus (COVID-19)?  To be considered HIGH RISK for Coronavirus (COVID-19), you have to meet the following criteria:   Traveled to [place], [place], [place], [place] or [place]; or in the [place] to [place], [place], [place], or [place]; and have fever, cough, and shortness of breath within the last 2 weeks of travel OR  Been in close contact with a person diagnosed with COVID-19 within the last 2 weeks and have fever, cough, and shortness of breath  IF YOU DO NOT MEET THESE CRITERIA, YOU ARE CONSIDERED LOW RISK FOR COVID-19.  What to do if you are HIGH RISK for COVID-19?   If you are having a medical emergency, call 911.  Seek medical care right away. Before you go to a doctors office, urgent care or emergency department, call ahead and tell them about your recent travel, contact with someone diagnosed with COVID-19, and your symptoms. You should receive instructions from your physicians office regarding next steps of care.   When you arrive at healthcare provider, tell the healthcare staff immediately you have returned from visiting [place], [place], [place], [place] or [place]; or traveled in the [place] to [place], [place], [place], or [place]; in the last two weeks or you have been in close contact with a person diagnosed with COVID-19 in the last 2 weeks.    Tell the health care staff about your symptoms: fever, cough and shortness of breath.  After you have been seen by a medical provider, you will be either: o Tested for (COVID-19) and discharged home on quarantine except to seek medical care if symptoms worsen, and asked to  - Stay home and avoid contact with others until you get your results (4-5 days)  - Avoid travel on public  transportation if possible (such as bus, train, or airplane) or o Sent to the Emergency Department by EMS for evaluation, COVID-19 testing, and possible admission depending on your condition and test results.  What to do if you are LOW RISK for COVID-19?  Reduce your risk of any infection by using the same precautions used for avoiding the common cold or flu:   Wash your hands often with soap and warm water for at least 20 seconds.  If soap and water are not readily available, use an alcohol-based hand sanitizer with at least 60% alcohol.   If coughing or sneezing, cover your mouth and nose by coughing or sneezing into the elbow areas of your shirt or coat, into a tissue or into your sleeve (not your hands).  Avoid shaking hands with others and consider head nods or verbal greetings only.  Avoid touching your eyes, nose, or mouth with unwashed hands.   Avoid close contact with people who are sick.  Avoid places or events with large numbers of people in one location, like concerts or sporting events.  Carefully consider travel plans you have or are making.  If you are planning any travel outside or inside the [place], visit the [place] webpage for the latest health notices.  If you have some symptoms but not all symptoms, continue to monitor at home and seek medical attention if your symptoms worsen.  If you are having a medical emergency, call 911.   [place] / e-Visit: [email or web address]         [facility]: [phone]  [facility]: [phone]                   [facility]: [phone]    Coronavirus (COVID-19) Are you at risk?  Are you at risk for the Coronavirus (COVID-19)?  To be considered HIGH RISK for Coronavirus (COVID-19), you have to meet the following criteria:   Traveled to [place], [place], [place], [place]  or [place]; or in the [place] to [place], [place], [place], or [place]; and have fever, cough, and shortness of breath within the last 2 weeks of travel OR  Been in close contact with a person diagnosed with COVID-19 within the last 2 weeks and have fever, cough, and shortness of breath  IF YOU DO NOT MEET THESE CRITERIA, YOU ARE CONSIDERED LOW RISK FOR COVID-19.  What to do if you are HIGH RISK for COVID-19?   If you are having a medical emergency, call 911.  Seek medical care right away. Before you go to a doctors office, urgent care or emergency department, call ahead and tell them about your recent travel, contact with someone diagnosed with COVID-19, and your symptoms. You should receive instructions from your physicians office regarding next steps of care.   When you arrive at healthcare provider, tell the healthcare staff immediately you have returned from visiting [place], [place], [place], [place] or [place]; or traveled in the [place] to [place], [place], [place], or [place]; in the last two weeks or you have been in close  contact with a person diagnosed with COVID-19 in the last 2 weeks.    Tell the health care staff about your symptoms: fever, cough and shortness of breath.  After you have been seen by a medical provider, you will be either: o Tested for (COVID-19) and discharged home on quarantine except to seek medical care if symptoms worsen, and asked to  - Stay home and avoid contact with others until you get your results (4-5 days)  - Avoid travel on public transportation if possible (such as bus, train, or airplane) or o Sent to the Emergency Department by EMS for evaluation, COVID-19 testing, and possible admission depending on your condition and test results.  What to do if you are LOW RISK for COVID-19?  Reduce your risk of any infection by using the same precautions used for avoiding the common cold or flu:   Wash your hands often with  soap and warm water for at least 20 seconds.  If soap and water are not readily available, use an alcohol-based hand sanitizer with at least 60% alcohol.   If coughing or sneezing, cover your mouth and nose by coughing or sneezing into the elbow areas of your shirt or coat, into a tissue or into your sleeve (not your hands).  Avoid shaking hands with others and consider head nods or verbal greetings only.  Avoid touching your eyes, nose, or mouth with unwashed hands.   Avoid close contact with people who are sick.  Avoid places or events with large numbers of people in one location, like concerts or sporting events.  Carefully consider travel plans you have or are making.  If you are planning any travel outside or inside the [place], visit the [place] webpage for the latest health notices.  If you have some symptoms but not all symptoms, continue to monitor at home and seek medical attention if your symptoms worsen.  If you are having a medical emergency, call 911.   [place] / e-Visit: [email or web address]         [facility]: [facility]: [phone]                   [facility]: [phone]

## 2019-01-11 LAB — CULTURE, OB URINE: Culture: >=100000 — AB

## 2019-01-29 ENCOUNTER — Telehealth: Payer: Self-pay | Admitting: *Deleted

## 2019-01-29 NOTE — Telephone Encounter (Signed)
Patient informed that we are not allowing visitors or children to come to appointments at this time. Patient denies any contact with anyone suspected or confirmed of having COVID-19. Pt denies fever, cough, sob, muscle pain, diarrhea, Yuridiana [place], vomiting, abdominal pain, red eye, weakness, bruising or bleeding, joint pain or severe headache.  

## 2019-01-30 ENCOUNTER — Other Ambulatory Visit: Payer: Self-pay

## 2019-01-30 ENCOUNTER — Other Ambulatory Visit: Payer: Self-pay | Admitting: Obstetrics & Gynecology

## 2019-01-30 ENCOUNTER — Ambulatory Visit (INDEPENDENT_AMBULATORY_CARE_PROVIDER_SITE_OTHER): Payer: 59

## 2019-01-30 ENCOUNTER — Encounter: Payer: Self-pay | Admitting: Obstetrics and Gynecology

## 2019-01-30 ENCOUNTER — Ambulatory Visit (INDEPENDENT_AMBULATORY_CARE_PROVIDER_SITE_OTHER): Payer: 59 | Admitting: Obstetrics and Gynecology

## 2019-01-30 VITALS — BP 139/88 | HR 94 | Temp 98.1°F | Wt 224.4 lb

## 2019-01-30 DIAGNOSIS — Z3481 Encounter for supervision of other normal pregnancy, first trimester: Secondary | ICD-10-CM

## 2019-01-30 DIAGNOSIS — Z1389 Encounter for screening for other disorder: Secondary | ICD-10-CM

## 2019-01-30 DIAGNOSIS — Z3A20 20 weeks gestation of pregnancy: Secondary | ICD-10-CM

## 2019-01-30 DIAGNOSIS — Z331 Pregnant state, incidental: Secondary | ICD-10-CM

## 2019-01-30 DIAGNOSIS — Z363 Encounter for antenatal screening for malformations: Secondary | ICD-10-CM | POA: Diagnosis not present

## 2019-01-30 DIAGNOSIS — Z3482 Encounter for supervision of other normal pregnancy, second trimester: Secondary | ICD-10-CM

## 2019-01-30 LAB — POCT URINALYSIS DIPSTICK OB
Blood, UA: POSITIVE
Glucose, UA: NEGATIVE
Ketones, UA: NEGATIVE
Leukocytes, UA: NEGATIVE
Nitrite, UA: NEGATIVE

## 2019-01-30 NOTE — Progress Notes (Signed)
[place] 20 wks,breech,anterior placenta gr 0,normal ovaries bilat,cx 4.8 cm,svp of fluid 5.2 cm,fhr 150 bpm,efw 349 g 66%,limited view of NB because of fetal position,no obvious abnormalities

## 2019-01-30 NOTE — Progress Notes (Signed)
Patient ID: Janet [name], female   DOB: 1986/01/05, 33 y.o.   MRN: [id number]    LOW-RISK PREGNANCY VISIT Patient name: Janet [name] MRN [id number]  Date of birth: 17-Feb-1986 Chief Complaint:   No chief complaint on file.  History of Present Illness:   Janet [name] is a 33 y.o. G2P1001 female at [redacted]w[redacted]d with an Estimated Date of Delivery: 06/19/19 being seen today for ongoing management of a low-risk pregnancy.  Today she reports Complaints of white bleeding when she wiped her rectum.  She has a history of hemorrhoid surgery.  She does not feel an exposed hemorrhoid but desires to be examined..  .  .   . denies leaking of fluid.  She is also noted that her heart seems to be beating a little bit faster.  Today is 33.  She runs chronic anemia with a hemoglobin of 10.4 so we will add iron and a stool softener shortness of breath, is noted is mild mainly when she is lying in bed notices her heart "beating strong" like it is going to jump out of her chest.  There is no skipped beats and no lightheadedness Review of Systems:   Pertinent items are noted in HPI Denies abnormal vaginal discharge w/ itching/odor/irritation, headaches, visual changes, chest pain, abdominal pain, severe nausea/vomiting, or problems with urination or bowel movements unless otherwise stated above. Pertinent History Reviewed:  Reviewed past medical,surgical, social, obstetrical and family history.  Reviewed problem list, medications and allergies. Physical Assessment:  There were no vitals filed for this visit.There is no height or weight on file to calculate BMI.        Physical Examination:   General appearance: Well appearing, and in no distress  Mental status: Alert, oriented to person, place, and time  Skin: Warm & dry  Cardiovascular: Normal heart rate noted  Respiratory: Normal respiratory effort, no distress  Abdomen: Soft, gravid, nontender  Pelvic: Cervical exam deferred         Extremities:    Fetal  Status:          No results found for this or any previous visit (from the past 24 hour(s)).  Assessment & Plan:  1) Low-risk pregnancy G2P1001 at [redacted]w[redacted]d with an Estimated Date of Delivery: 06/19/19   2) history of preeclampsia will resume baby aspirin,  3.  History of Chiari malformation   Meds: No orders of the defined types were placed in this encounter.  Labs/procedures today: 20-week anatomy ultrasound normal with difficulty viewing the nasal bone  Plan:  1. Continue routine obstetrical care  2. F/u in 4 weeks  Reviewed: Ultrasound Follow-up: 4 weeks for telemetry visit, 8 weeks for prenatal to labs and visit in office  No orders of the defined types were placed in this encounter.  By signing my name below, I, Samul Dada, attest that this documentation has been prepared under the direction and in the presence of Jonnie Kind, MD. Electronically Signed: [place]. 01/30/19. 1:33 PM.  I personally performed the services described in this documentation, which was SCRIBED in my presence. The recorded information has been reviewed and considered accurate. It has been edited as necessary during review. Jonnie Kind, MD

## 2019-02-27 ENCOUNTER — Other Ambulatory Visit: Payer: Self-pay

## 2019-02-27 ENCOUNTER — Ambulatory Visit (INDEPENDENT_AMBULATORY_CARE_PROVIDER_SITE_OTHER): Payer: 59 | Admitting: Obstetrics and Gynecology

## 2019-02-27 ENCOUNTER — Encounter: Payer: Self-pay | Admitting: Obstetrics and Gynecology

## 2019-02-27 VITALS — BP 122/84 | HR 105 | Wt 226.0 lb

## 2019-02-27 DIAGNOSIS — Z3482 Encounter for supervision of other normal pregnancy, second trimester: Secondary | ICD-10-CM

## 2019-02-27 DIAGNOSIS — Z3A24 24 weeks gestation of pregnancy: Secondary | ICD-10-CM

## 2019-02-27 NOTE — Progress Notes (Signed)
Patient ID: Janet [name], female   DOB: Jul 01, 1986, 33 y.o.   MRN: [id number]    TELEHEALTH VIRTUAL OBSTETRICS VISIT ENCOUNTER NOTE  I connected with Janet [name] on 02/27/2019 at  2:00 PM EDT by telephone at home and verified that I am speaking with the cor/rect person using two identifiers.   I discussed the limitations, risks, security and privacy concerns of performing an evaluation and management service by telephone and the availability of in person appointments. I also discussed with the patient that there may be a patient responsible charge related to this service. The patient expressed understanding and agreed to proceed.  Subjective:  Janet [name] is a 33 y.o. G2P1001 at [redacted]w[redacted]d being followed for ongoing prenatal care.  She is currently monitored for the following issues for this low-risk pregnancy BP this morning 125/86 122/84 before call.  H/X OF PRE-ECLAMPSIA & PP HTN, postpartum hemorrhage. Has started getting "chari headaches". She has to hold her head when her head hurts. Neurologist had prescribed her Elavil  but she can no longer take it due pregnancy. She doesn't want epidural if she can manage labor without it.  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  Patient reports no complaints. Reports fetal movement. Denies any contractions, bleeding or leaking of fluid.   The following portions of the patient's history were reviewed and updated as appropriate: allergies, current medications, past  family history, past medical history, past social history, past surgical history and problem list.   Objective:   General:  Alert, oriented and cooperative.   Mental Status: Normal mood and affect perceived. Normal judgment and thought content.  Rest of physical exam deferred due to type of encounter  Assessment and Plan:  Pregnancy: G2P1001 at [redacted]w[redacted]d                      History of Chiari malformation, with history of epidural with first pregnancy                      Recent headache managed with Tylenol Preterm labor symptoms and general obstetric precautions including but not limited to vaginal bleeding, contractions, leaking of fluid and fetal movement were reviewed in detail with the patient.  I discussed the assessment and treatment plan with the patient at 33 weeks on epidural for labor. Pt to keep headache record over the next month,  to speak with anesthesiologist  & neurologist Dr. Jannifer Franklin about labor & delivery plan .The patient was advised to call back or seek an in-person office evaluation/go to [facility]U at [facility] for any urgent or concerning symptoms. F/u in 4 week for PN2    I provided 15 minutes of non-face-to-face time during this encounter.  No follow-ups on file.  No future appointments.  By signing my name below, I, Samul Dada, attest that this documentation has been prepared under the direction and in the presence of Jonnie Kind, MD. Electronically Signed: Calvin. 02/27/19. 2:53 PM.  I personally performed the services described in this documentation, which was SCRIBED in my presence.  The recorded information has been reviewed and considered accurate. It has been edited as necessary during review. Jonnie Kind, MD

## 2019-03-25 ENCOUNTER — Encounter: Payer: Self-pay | Admitting: *Deleted

## 2019-03-26 ENCOUNTER — Other Ambulatory Visit: Payer: Self-pay

## 2019-03-26 ENCOUNTER — Encounter: Payer: Self-pay | Admitting: Women's Health

## 2019-03-26 ENCOUNTER — Ambulatory Visit (INDEPENDENT_AMBULATORY_CARE_PROVIDER_SITE_OTHER): Payer: 59 | Admitting: Women's Health

## 2019-03-26 ENCOUNTER — Other Ambulatory Visit: Payer: 59

## 2019-03-26 VITALS — BP 133/82 | HR 99 | Wt 233.0 lb

## 2019-03-26 DIAGNOSIS — Z3482 Encounter for supervision of other normal pregnancy, second trimester: Secondary | ICD-10-CM

## 2019-03-26 DIAGNOSIS — Z1389 Encounter for screening for other disorder: Secondary | ICD-10-CM

## 2019-03-26 DIAGNOSIS — O09292 Supervision of pregnancy with other poor reproductive or obstetric history, second trimester: Secondary | ICD-10-CM

## 2019-03-26 DIAGNOSIS — O09299 Supervision of pregnancy with other poor reproductive or obstetric history, unspecified trimester: Secondary | ICD-10-CM

## 2019-03-26 DIAGNOSIS — Z3A27 27 weeks gestation of pregnancy: Secondary | ICD-10-CM

## 2019-03-26 DIAGNOSIS — Z23 Encounter for immunization: Secondary | ICD-10-CM | POA: Diagnosis not present

## 2019-03-26 DIAGNOSIS — Z331 Pregnant state, incidental: Secondary | ICD-10-CM

## 2019-03-26 LAB — POCT URINALYSIS DIPSTICK OB
Blood, UA: 3
Glucose, UA: NEGATIVE
Leukocytes, UA: NEGATIVE
Nitrite, UA: NEGATIVE

## 2019-03-26 NOTE — Progress Notes (Addendum)
   LOW-RISK PREGNANCY VISIT Patient name: NANDA BITTICK MRN [id number]  Date of birth: 1986-04-05 Chief Complaint:   Routine Prenatal Visit (PN2)  History of Present Illness:   MAILYN STEICHEN is a 33 y.o. G34P1001 female at [redacted]w[redacted]d with an Estimated Date of Delivery: 06/19/19 being seen today for ongoing management of a low-risk pregnancy.  Today she reports no complaints. Contractions: Not present.  .  Movement: Present. denies leaking of fluid. Review of Systems:   Pertinent items are noted in HPI Denies abnormal vaginal discharge w/ itching/odor/irritation, headaches, visual changes, shortness of breath, chest pain, abdominal pain, severe nausea/vomiting, or problems with urination or bowel movements unless otherwise stated above. Pertinent History Reviewed:  Reviewed past medical,surgical, social, obstetrical and family history.  Reviewed problem list, medications and allergies. Physical Assessment:   Vitals:   03/26/19 0904  BP: 133/82  Pulse: 99  Weight: 233 lb (105.7 kg)  Body mass index is 39.99 kg/m.        Physical Examination:   General appearance: Well appearing, and in no distress  Mental status: Alert, oriented to person, place, and time  Skin: Warm & dry  Cardiovascular: Normal heart rate noted  Respiratory: Normal respiratory effort, no distress  Abdomen: Soft, gravid, nontender  Pelvic: Cervical exam deferred         Extremities: Edema: None  Fetal Status: Fetal Heart Rate (bpm): 145 Fundal Height: 29 cm Movement: Present    Results for orders placed or performed in visit on 03/26/19 (from the past 24 hour(s))  POC Urinalysis Dipstick OB   Collection Time: 03/26/19  9:14 AM  Result Value Ref Range   Color, UA     Clarity, UA     Glucose, UA Negative Negative   Bilirubin, UA     Ketones, UA trace    Spec Grav, UA     Blood, UA 3    pH, UA     POC,PROTEIN,UA Trace Negative, Trace, Small (1+), Moderate (2+), Large (3+), 4+   Urobilinogen, UA     Nitrite,  UA neg    Leukocytes, UA Negative Negative   Appearance     Odor      Assessment & Plan:  1) Low-risk pregnancy G2P1001 at [redacted]w[redacted]d with an Estimated Date of Delivery: 06/19/19   2) Chronic hematuria, f/u pp  3) H/O pre-e, PPHTN> stopped ASA earlier in pregnancy, was making her dizzy  4) H/O shoulder dystocia> EFW @ 36wk  5) H/O PPH  6) Chiari malformation> stable   Meds: No orders of the defined types were placed in this encounter.  Labs/procedures today: pn2, tdap  Plan:  Continue routine obstetrical care   Reviewed: Preterm labor symptoms and general obstetric precautions including but not limited to vaginal bleeding, contractions, leaking of fluid and fetal movement were reviewed in detail with the patient.  All questions were answered  Follow-up: Return in about 4 weeks (around 04/23/2019) for LROB webex.  Orders Placed This Encounter  Procedures  . Tdap vaccine greater than or equal to 7yo IM  . POC Urinalysis Dipstick OB   Roma Schanz CNM, [facility] 03/26/2019 9:37 AM

## 2019-03-26 NOTE — Patient Instructions (Signed)
Janet [name], I greatly value your feedback.  If you receive a survey following your visit with [place] today, we appreciate you taking the time to fill it out.  Thanks, [name], CNM, [facility]  [place]!!! It is now [place] [facility] ([place], [place] [id number]) Entrance located off of [address] parking   Home Blood Pressure Monitoring for Patients   Your provider has recommended that you check your blood pressure (BP) at least once a week at home. If you do not have a blood pressure cuff at home, one will be provided for you. Contact your provider if you have not received your monitor within 1 week.   Helpful Tips for Accurate Home Blood Pressure Checks  . Don't smoke, exercise, or drink caffeine 30 minutes before checking your BP . Use the restroom before checking your BP (a full bladder can raise your pressure) . Relax in a comfortable upright chair . Feet on the ground . Left arm resting comfortably on a flat surface at the level of your heart . Legs uncrossed . Back supported . Sit quietly and don't talk . Place the cuff on your bare arm . Adjust snuggly, so that only two fingertips can fit between your skin and the top of the cuff . Check 2 readings separated by at least one minute . Keep a log of your BP readings . For a visual, please reference this diagram: http://ccnc.care/bpdiagram  Provider Name: [facility]     Phone: [phone]  Zone 1: ALL CLEAR  Continue to monitor your symptoms:  . BP reading is less than 140 (top number) or less than 90 (bottom number)  . No right upper stomach pain . No headaches or seeing spots . No feeling nauseated or throwing up . No swelling in face and hands  Zone 2: CAUTION Call your doctor's office for any of the following:  . BP reading is greater than 140 (top number) or greater than 90 (bottom number)  . Stomach pain under your ribs in the middle  or right side . Headaches or seeing spots . Feeling nauseated or throwing up . Swelling in face and hands  Zone 3: EMERGENCY  Seek immediate medical care if you have any of the following:  . BP reading is greater than160 (top number) or greater than 110 (bottom number) . Severe headaches not improving with Tylenol . Serious difficulty catching your breath . Any worsening symptoms from Zone 2     Call the office [phone]) or go to [facility] if:  You begin to have strong, frequent contractions  Your water breaks.  Sometimes it is a big gush of fluid, sometimes it is just a trickle that keeps getting your panties wet or running down your legs  You have vaginal bleeding.  It is normal to have a small amount of spotting if your cervix was checked.   You don't feel your baby moving like normal.  If you don't, get you something to eat and drink and lay down and focus on feeling your baby move.  You should feel at least 10 movements in 2 hours.  If you don't, you should call the office or go to [facility].    Tdap Vaccine  It is recommended that you get the Tdap vaccine during the third trimester of EACH pregnancy to help protect your baby from getting pertussis (whooping cough)  27-36 weeks  is the BEST time to do this so that you can pass the protection on to your baby. During pregnancy is better than after pregnancy, but if you are unable to get it during pregnancy it will be offered at the hospital.   You can get this vaccine with [place], at the health department, your family doctor, or some local pharmacies  Everyone who will be around your baby should also be up-to-date on their vaccines before the baby comes. Adults (who are not pregnant) only need 1 dose of Tdap during adulthood.   Third Trimester of Pregnancy The third trimester is from week 29 through week 42, months 7 through 9. The third trimester is a time when the fetus is growing rapidly. At the end of the ninth  month, the fetus is about 20 inches in length and weighs 6-10 pounds.  BODY CHANGES Your body goes through many changes during pregnancy. The changes vary from woman to woman.   Your weight will continue to increase. You can expect to gain 25-35 pounds (11-16 kg) by the end of the pregnancy.  You may begin to get stretch marks on your hips, abdomen, and breasts.  You may urinate more often because the fetus is moving lower into your pelvis and pressing on your bladder.  You may develop or continue to have heartburn as a result of your pregnancy.  You may develop constipation because certain hormones are causing the muscles that push waste through your intestines to slow down.  You may develop hemorrhoids or swollen, bulging veins (varicose veins).  You may have pelvic pain because of the weight gain and pregnancy hormones relaxing your joints between the bones in your pelvis. Backaches may result from overexertion of the muscles supporting your posture.  You may have changes in your hair. These can include thickening of your hair, rapid growth, and changes in texture. Some women also have hair loss during or after pregnancy, or hair that feels dry or thin. Your hair will most likely return to normal after your baby is born.  Your breasts will continue to grow and be tender. A yellow discharge may leak from your breasts called colostrum.  Your belly button may stick out.  You may feel short of breath because of your expanding uterus.  You may notice the fetus "dropping," or moving lower in your abdomen.  You may have a bloody mucus discharge. This usually occurs a few days to a week before labor begins.  Your cervix becomes thin and soft (effaced) near your due date. WHAT TO EXPECT AT YOUR PRENATAL EXAMS  You will have prenatal exams every 2 weeks until week 36. Then, you will have weekly prenatal exams. During a routine prenatal visit:  You will be weighed to make sure you and the  fetus are growing normally.  Your blood pressure is taken.  Your abdomen will be measured to track your baby's growth.  The fetal heartbeat will be listened to.  Any test results from the previous visit will be discussed.  You may have a cervical check near your due date to see if you have effaced. At around 36 weeks, your caregiver will check your cervix. At the same time, your caregiver will also perform a test on the secretions of the vaginal tissue. This test is to determine if a type of bacteria, Group B streptococcus, is present. Your caregiver will explain this further. Your caregiver may ask you:  What your birth plan is.  How you  are feeling.  If you are feeling the baby move.  If you have had any abnormal symptoms, such as leaking fluid, bleeding, severe headaches, or abdominal cramping.  If you have any questions. Other tests or screenings that may be performed during your third trimester include:  Blood tests that check for low iron levels (anemia).  Fetal testing to check the health, activity level, and growth of the fetus. Testing is done if you have certain medical conditions or if there are problems during the pregnancy. FALSE LABOR You may feel small, irregular contractions that eventually go away. These are called Braxton Hicks contractions, or false labor. Contractions may last for hours, days, or even weeks before true labor sets in. If contractions come at regular intervals, intensify, or become painful, it is best to be seen by your caregiver.  SIGNS OF LABOR   Menstrual-like cramps.  Contractions that are 5 minutes apart or less.  Contractions that start on the top of the uterus and spread down to the lower abdomen and back.  A sense of increased pelvic pressure or back pain.  A watery or bloody mucus discharge that comes from the vagina. If you have any of these signs before the 37th week of pregnancy, call your caregiver right away. You need to go to  the hospital to get checked immediately. HOME CARE INSTRUCTIONS   Avoid all smoking, herbs, alcohol, and unprescribed drugs. These chemicals affect the formation and growth of the baby.  Follow your caregiver's instructions regarding medicine use. There are medicines that are either safe or unsafe to take during pregnancy.  Exercise only as directed by your caregiver. Experiencing uterine cramps is a good sign to stop exercising.  Continue to eat regular, healthy meals.  Wear a good support bra for breast tenderness.  Do not use hot tubs, steam rooms, or saunas.  Wear your seat belt at all times when driving.  Avoid raw meat, uncooked cheese, cat litter boxes, and soil used by cats. These carry germs that can cause birth defects in the baby.  Take your prenatal vitamins.  Try taking a stool softener (if your caregiver approves) if you develop constipation. Eat more high-fiber foods, such as fresh vegetables or fruit and whole grains. Drink plenty of fluids to keep your urine clear or pale yellow.  Take warm sitz baths to soothe any pain or discomfort caused by hemorrhoids. Use hemorrhoid cream if your caregiver approves.  If you develop varicose veins, wear support hose. Elevate your feet for 15 minutes, 3-4 times a day. Limit salt in your diet.  Avoid heavy lifting, wear low heal shoes, and practice good posture.  Rest a lot with your legs elevated if you have leg cramps or low back pain.  Visit your dentist if you have not gone during your pregnancy. Use a soft toothbrush to brush your teeth and be gentle when you floss.  A sexual relationship may be continued unless your caregiver directs you otherwise.  Do not travel far distances unless it is absolutely necessary and only with the approval of your caregiver.  Take prenatal classes to understand, practice, and ask questions about the labor and delivery.  Make a trial run to the hospital.  Pack your hospital  bag.  Prepare the baby's nursery.  Continue to go to all your prenatal visits as directed by your caregiver. SEEK MEDICAL CARE IF:  You are unsure if you are in labor or if your water has broken.  You have dizziness.  You have mild pelvic cramps, pelvic pressure, or nagging pain in your abdominal area.  You have persistent nausea, vomiting, or diarrhea.  You have a bad smelling vaginal discharge.  You have pain with urination. SEEK IMMEDIATE MEDICAL CARE IF:   You have a fever.  You are leaking fluid from your vagina.  You have spotting or bleeding from your vagina.  You have severe abdominal cramping or pain.  You have rapid weight loss or gain.  You have shortness of breath with chest pain.  You notice sudden or extreme swelling of your face, hands, ankles, feet, or legs.  You have not felt your baby move in over an hour.  You have severe headaches that do not go away with medicine.  You have vision changes. Document Released: 09/20/2001 Document Revised: 10/01/2013 Document Reviewed: 11/27/2012 [facility] Patient Information 2015 [place], [place]. This information is not intended to replace advice given to you by your health care provider. Make sure you discuss any questions you have with your health care provider.

## 2019-03-27 LAB — CBC
Hematocrit: 30.2 % — ABNORMAL LOW (ref 34.0–46.6)
Hemoglobin: 10.5 g/dL — ABNORMAL LOW (ref 11.1–15.9)
MCH: 28.8 pg (ref 26.6–33.0)
MCHC: 34.8 g/dL (ref 31.5–35.7)
MCV: 83 fL (ref 79–97)
Platelets: 292 10*3/uL (ref 150–450)
RBC: 3.65 x10E6/uL — ABNORMAL LOW (ref 3.77–5.28)
RDW: 13.1 % (ref 11.7–15.4)
WBC: 7.2 10*3/uL (ref 3.4–10.8)

## 2019-03-27 LAB — RPR: RPR Ser Ql: NONREACTIVE

## 2019-03-27 LAB — GLUCOSE TOLERANCE, 2 HOURS W/ 1HR
Glucose, 1 hour: 140 mg/dL (ref 65–179)
Glucose, 2 hour: 76 mg/dL (ref 65–152)
Glucose, Fasting: 78 mg/dL (ref 65–91)

## 2019-03-27 LAB — HIV ANTIBODY (ROUTINE TESTING W REFLEX): HIV Screen 4th Generation wRfx: NONREACTIVE

## 2019-03-27 LAB — ANTIBODY SCREEN: Antibody Screen: NEGATIVE

## 2019-03-29 ENCOUNTER — Other Ambulatory Visit (INDEPENDENT_AMBULATORY_CARE_PROVIDER_SITE_OTHER): Payer: 59

## 2019-03-29 ENCOUNTER — Other Ambulatory Visit: Payer: Self-pay

## 2019-03-29 DIAGNOSIS — Z1389 Encounter for screening for other disorder: Secondary | ICD-10-CM

## 2019-03-29 DIAGNOSIS — Z331 Pregnant state, incidental: Secondary | ICD-10-CM

## 2019-03-29 LAB — POCT URINALYSIS DIPSTICK OB
Glucose, UA: NEGATIVE
Ketones, UA: NEGATIVE
Leukocytes, UA: NEGATIVE
Nitrite, UA: NEGATIVE

## 2019-03-29 NOTE — Progress Notes (Signed)
Patient states she thought she was having urinary pressure but now thinks it is just the baby pushing down.  Does not feel the pressure as much now.  She has ordered an maternity band.  Informed that may help a lot with the pressure as urine was negative except for blood which was present at last visit.  Will not send for culture.

## 2019-04-02 ENCOUNTER — Other Ambulatory Visit: Payer: 59

## 2019-04-10 ENCOUNTER — Telehealth: Payer: Self-pay | Admitting: Women's Health

## 2019-04-10 NOTE — Telephone Encounter (Signed)
Patient states she is having heart burn and wants to know if she can take Protonix.  Informed it was not the first choice but could take it.  Can also try Prilosec and Prevacid.  Verbalized understanding.

## 2019-04-10 NOTE — Telephone Encounter (Signed)
Patient called, she is wanting to know what she can take for Acid Reflux.  She stated she has a script for Protonix, but she wasn't sure if she can take it due to her being pregnant.  [phone]

## 2019-04-23 ENCOUNTER — Ambulatory Visit (INDEPENDENT_AMBULATORY_CARE_PROVIDER_SITE_OTHER): Payer: 59 | Admitting: Women's Health

## 2019-04-23 ENCOUNTER — Ambulatory Visit: Payer: 59 | Admitting: *Deleted

## 2019-04-23 ENCOUNTER — Encounter: Payer: Self-pay | Admitting: Women's Health

## 2019-04-23 ENCOUNTER — Other Ambulatory Visit: Payer: Self-pay

## 2019-04-23 VITALS — BP 133/94 | HR 102 | Wt 241.0 lb

## 2019-04-23 DIAGNOSIS — Z3A31 31 weeks gestation of pregnancy: Secondary | ICD-10-CM

## 2019-04-23 DIAGNOSIS — R030 Elevated blood-pressure reading, without diagnosis of hypertension: Secondary | ICD-10-CM

## 2019-04-23 DIAGNOSIS — Z3483 Encounter for supervision of other normal pregnancy, third trimester: Secondary | ICD-10-CM

## 2019-04-23 NOTE — Progress Notes (Addendum)
TELEHEALTH VIRTUAL OBSTETRICS VISIT ENCOUNTER NOTE Patient name: Janet [name] MRN [id number]  Date of birth: [birth year]  I connected with patient on 04/23/19 at  2:30 PM EDT by [facility] and verified that I am speaking with the correct person using two identifiers. Due to COVID-19 recommendations, pt is not currently in our office.    I discussed the limitations, risks, security and privacy concerns of performing an evaluation and management service by telephone and the availability of in person appointments. I also discussed with the patient that there may be a patient responsible charge related to this service. The patient expressed understanding and agreed to proceed.  Chief Complaint:   Follow-up (pressure on lt leg pain radiated down)  History of Present Illness:   Janet [name] is a 33 y.o. G30P1001 female at [redacted]w[redacted]d with an Estimated Date of Delivery: 06/19/19 being evaluated today for ongoing management of a low-risk pregnancy.  Today she reports pain Lt hip radiating into Lt leg.  HA the other day, went away w/ APAP. Denies visual changes, ruq/epigastric pain, n/v. Home bp's have been normal. Does have h/o pre-e & PPHTN.  Contractions: Not present.  .  Movement: Present. Denies leaking of fluid. Review of Systems:   Pertinent items are noted in HPI Denies abnormal vaginal discharge w/ itching/odor/irritation, headaches, visual changes, shortness of breath, chest pain, abdominal pain, severe nausea/vomiting, or problems with urination or bowel movements unless otherwise stated above. Pertinent History Reviewed:  Reviewed past medical,surgical, social, obstetrical and family history.  Reviewed problem list, medications and allergies. Physical Assessment:   Vitals:   04/23/19 1441 04/23/19 1502  BP: (!) 147/84 (!) 133/94  Pulse: (!) 102   Weight: 241 lb (109.3 kg)   Body mass index is 41.37 kg/m.        Physical Examination:   General:  Alert, oriented and cooperative.   Mental  Status: Normal mood and affect perceived. Normal judgment and thought content.  Rest of physical exam deferred due to type of encounter  No results found for this or any previous visit (from the past 24 hour(s)).  Assessment & Plan:  1) Pregnancy G2P1001 at [redacted]w[redacted]d with an Estimated Date of Delivery: 06/19/19   2) Elevated bp, h/o pre-e & PPHTN, didn't start until 40.5wks last pregnancy. Come now for bp check w/ RN, labs. If elevated here, will discuss further poc  3) H/O shoulder dystocia> EFW ~36wks  4) H/O PPH  5) Chiari malformation>stable  6) Sciatica> gave exercises on AVS   Meds: No orders of the defined types were placed in this encounter.   Labs/procedures today: coming to get pre-e labs  Plan:  Continue routine obstetrical care.     Reviewed: Preterm labor symptoms and general obstetric precautions including but not limited to vaginal bleeding, contractions, leaking of fluid and fetal movement were reviewed in detail with the patient. The patient was advised to call back or seek an in-person office evaluation/go to MAU at [facility] [place] for any urgent or concerning symptoms. All questions were answered. Please refer to After Visit Summary for other counseling recommendations.    I provided 10 minutes of non-face-to-face time during this encounter.  Follow-up: Return for today for bp check w/ nurse, and pre-e labs.  Orders Placed This Encounter  Procedures  . CBC  . Comprehensive metabolic panel  . Protein / creatinine ratio, urine   Roma Schanz CNM, [facility] 04/23/2019 3:07 PM   0355: BP in office 112/70, 117/77,  forgot to bring her cuff so we could check it against ours. No protein in urine, will cancel labs for today. Check home bp daily, let us know if >140/90. F/U in 2wks for in person ob visit, bring bp cuff.  Roma Schanz, CNM, [facility] 04/23/2019 3:59 PM

## 2019-04-23 NOTE — Addendum Note (Signed)
Addended by: Wells Guiles R on: 04/23/2019 04:00 PM   Modules accepted: Orders

## 2019-04-23 NOTE — Patient Instructions (Signed)
Janet [name], I greatly value your feedback.  If you receive a survey following your visit with [place] today, we appreciate you taking the time to fill it out.  Thanks, [name], CNM, [facility]  [facility]!!! It is now [name] at [facility] ([place], [place] [address]) Entrance located off of [address] parking    Call the office [phone]) or go to [facility] if:  You begin to have strong, frequent contractions  Your water breaks.  Sometimes it is a big gush of fluid, sometimes it is just a trickle that keeps getting your panties wet or running down your legs  You have vaginal bleeding.  It is normal to have a small amount of spotting if your cervix was checked.   You don't feel your baby moving like normal.  If you don't, get you something to eat and drink and lay down and focus on feeling your baby move.  You should feel at least 10 movements in 2 hours.  If you don't, you should call the office or go to [facility]   Your provider has recommended that you check your blood pressure (BP) at least once a week at home. If you do not have a blood pressure cuff at home, one will be provided for you. Contact your provider if you have not received your monitor within 1 week.   Helpful Tips for Accurate Home Blood Pressure Checks  . Don't smoke, exercise, or drink caffeine 30 minutes before checking your BP . Use the restroom before checking your BP (a full bladder can raise your pressure) . Relax in a comfortable upright chair . Feet on the ground . Left arm resting comfortably on a flat surface at the level of your heart . Legs uncrossed . Back supported . Sit quietly and don't talk . Place the cuff on your bare arm . Adjust snuggly, so that only two fingertips can fit between your skin and the top of the cuff . Check 2 readings separated by at least one minute  . Keep a log of your BP readings . For a visual, please reference this diagram: http://ccnc.care/bpdiagram  Provider Name: [facility]     Phone: [phone]  Zone 1: ALL CLEAR  Continue to monitor your symptoms:  . BP reading is less than 140 (top number) or less than 90 (bottom number)  . No right upper stomach pain . No headaches or seeing spots . No feeling nauseated or throwing up . No swelling in face and hands  Zone 2: CAUTION Call your doctor's office for any of the following:  . BP reading is greater than 140 (top number) or greater than 90 (bottom number)  . Stomach pain under your ribs in the middle or right side . Headaches or seeing spots . Feeling nauseated or throwing up . Swelling in face and hands  Zone 3: EMERGENCY  Seek immediate medical care if you have any of the following:  . BP reading is greater than160 (top number) or greater than 110 (bottom number) . Severe headaches not improving with Tylenol . Serious difficulty catching your breath . Any worsening symptoms from Zone 2    Sciatica Rehab Ask your health care provider which exercises are safe for you. Do exercises exactly as told by your health care provider and adjust them as directed. It is normal to feel  mild stretching, pulling, tightness, or discomfort as you do these exercises. Stop right away if you feel sudden pain or your pain gets worse. Do not begin these exercises until told by your health care provider. Stretching and range-of-motion exercises These exercises warm up your muscles and joints and improve the movement and flexibility of your hips and back. These exercises also help to relieve pain, numbness, and tingling. Sciatic nerve glide 1. Sit in a chair with your head facing down toward your chest. Place your hands behind your back. Let your shoulders slump forward. 2. Slowly straighten one of your legs while you tilt your head back as if you are looking toward the ceiling. Only  straighten your leg as far as you can without making your symptoms worse. 3. Hold this position for __________ seconds. 4. Slowly return to the starting position. 5. Repeat with your other leg. Repeat __________ times. Complete this exercise __________ times a day. Knee to chest with hip adduction and internal rotation  1. Lie on your back on a firm surface with both legs straight. 2. Bend one of your knees and move it up toward your chest until you feel a gentle stretch in your lower back and buttock. Then, move your knee toward the shoulder that is on the opposite side from your leg. This is hip adduction and internal rotation. ? Hold your leg in this position by holding on to the front of your knee. 3. Hold this position for __________ seconds. 4. Slowly return to the starting position. 5. Repeat with your other leg. Repeat __________ times. Complete this exercise __________ times a day. Prone extension on elbows  1. Lie on your abdomen on a firm surface. A bed may be too soft for this exercise. 2. Prop yourself up on your elbows. 3. Use your arms to help lift your chest up until you feel a gentle stretch in your abdomen and your lower back. ? This will place some of your body weight on your elbows. If this is uncomfortable, try stacking pillows under your chest. ? Your hips should stay down, against the surface that you are lying on. Keep your hip and back muscles relaxed. 4. Hold this position for __________ seconds. 5. Slowly relax your upper body and return to the starting position. Repeat __________ times. Complete this exercise __________ times a day. Strengthening exercises These exercises build strength and endurance in your back. Endurance is the ability to use your muscles for a long time, even after they get tired. Pelvic tilt This exercise strengthens the muscles that lie deep in the abdomen. 1. Lie on your back on a firm surface. Bend your knees and keep your feet flat on  the floor. 2. Tense your abdominal muscles. Tip your pelvis up toward the ceiling and flatten your lower back into the floor. ? To help with this exercise, you may place a small towel under your lower back and try to push your back into the towel. 3. Hold this position for __________ seconds. 4. Let your muscles relax completely before you repeat this exercise. Repeat __________ times. Complete this exercise __________ times a day. Alternating arm and leg raises  1. Get on your hands and knees on a firm surface. If you are on a hard floor, you may want to use padding, such as an exercise mat, to cushion your knees. 2. Line up your arms and legs. Your hands should be directly below your shoulders, and your knees should be directly below  your hips. 3. Lift your left leg behind you. At the same time, raise your right arm and straighten it in front of you. ? Do not lift your leg higher than your hip. ? Do not lift your arm higher than your shoulder. ? Keep your abdominal and back muscles tight. ? Keep your hips facing the ground. ? Do not arch your back. ? Keep your balance carefully, and do not hold your breath. 4. Hold this position for __________ seconds. 5. Slowly return to the starting position. 6. Repeat with your right leg and your left arm. Repeat __________ times. Complete this exercise __________ times a day. Posture and body mechanics Good posture and healthy body mechanics can help to relieve stress in your body's tissues and joints. Body mechanics refers to the movements and positions of your body while you do your daily activities. Posture is part of body mechanics. Good posture means:  Your spine is in its natural S-curve position (neutral).  Your shoulders are pulled back slightly.  Your head is not tipped forward. Follow these guidelines to improve your posture and body mechanics in your everyday activities. Standing   When standing, keep your spine neutral and your feet  about hip width apart. Keep a slight bend in your knees. Your ears, shoulders, and hips should line up.  When you do a task in which you stand in one place for a long time, place one foot up on a stable object that is 2-4 inches (5-10 cm) high, such as a footstool. This helps keep your spine neutral. Sitting   When sitting, keep your spine neutral and keep your feet flat on the floor. Use a footrest, if necessary, and keep your thighs parallel to the floor. Avoid rounding your shoulders, and avoid tilting your head forward.  When working at a desk or a computer, keep your desk at a height where your hands are slightly lower than your elbows. Slide your chair under your desk so you are close enough to maintain good posture.  When working at a computer, place your monitor at a height where you are looking straight ahead and you do not have to tilt your head forward or downward to look at the screen. Resting  When lying down and resting, avoid positions that are most painful for you.  If you have pain with activities such as sitting, bending, stooping, or squatting, lie in a position in which your body does not bend very much. For example, avoid curling up on your side with your arms and knees near your chest (fetal position).  If you have pain with activities such as standing for a long time or reaching with your arms, lie with your spine in a neutral position and bend your knees slightly. Try the following positions: ? Lying on your side with a pillow between your knees. ? Lying on your back with a pillow under your knees. Lifting   When lifting objects, keep your feet at least shoulder width apart and tighten your abdominal muscles.  Bend your knees and hips and keep your spine neutral. It is important to lift using the strength of your legs, not your back. Do not lock your knees straight out.  Always ask for help to lift heavy or awkward objects. This information is not intended to  replace advice given to you by your health care provider. Make sure you discuss any questions you have with your health care provider. Document Released: 09/26/2005 Document Revised: 01/18/2019 Document  Reviewed: 10/18/2018 Elsevier Patient Education  [employer].

## 2019-05-07 ENCOUNTER — Encounter: Payer: 59 | Admitting: Obstetrics & Gynecology

## 2019-05-08 ENCOUNTER — Encounter: Payer: Self-pay | Admitting: Obstetrics and Gynecology

## 2019-05-08 ENCOUNTER — Ambulatory Visit (INDEPENDENT_AMBULATORY_CARE_PROVIDER_SITE_OTHER): Payer: 59 | Admitting: Obstetrics and Gynecology

## 2019-05-08 ENCOUNTER — Other Ambulatory Visit: Payer: Self-pay

## 2019-05-08 VITALS — BP 130/80 | HR 85 | Wt 243.6 lb

## 2019-05-08 DIAGNOSIS — Z3483 Encounter for supervision of other normal pregnancy, third trimester: Secondary | ICD-10-CM

## 2019-05-08 DIAGNOSIS — Z1389 Encounter for screening for other disorder: Secondary | ICD-10-CM

## 2019-05-08 DIAGNOSIS — Z331 Pregnant state, incidental: Secondary | ICD-10-CM

## 2019-05-08 DIAGNOSIS — Z3A34 34 weeks gestation of pregnancy: Secondary | ICD-10-CM

## 2019-05-08 LAB — POCT URINALYSIS DIPSTICK OB
Glucose, UA: NEGATIVE
Ketones, UA: NEGATIVE
Leukocytes, UA: NEGATIVE
Nitrite, UA: NEGATIVE
POC,PROTEIN,UA: NEGATIVE

## 2019-05-08 NOTE — Progress Notes (Signed)
Patient ID: [name], female   DOB: 03/30/1986, 33 y.o.   MRN: [id number]    LOW-RISK PREGNANCY VISIT Patient name: Janet [name] MRN [id number]  Date of birth: 1986-08-22 Chief Complaint:   Routine Prenatal Visit  History of Present Illness:   Janet [name] is a 33 y.o. G19P1001 female at [redacted]w[redacted]d with an Estimated Date of Delivery: 06/19/19 being seen today for ongoing management of a low-risk pregnancy. Has been noticing swelling in her feet compression stockings haven't been helping. BP at home is normal around 120/70. No headaches or blurry vision Today she reports swelling in feet. Contractions: Not present.  .  Movement: Present. denies leaking of fluid. Review of Systems:   Pertinent items are noted in HPI Denies abnormal vaginal discharge w/ itching/odor/irritation, headaches, visual changes, shortness of breath, chest pain, abdominal pain, severe nausea/vomiting, or problems with urination or bowel movements unless otherwise stated above. Pertinent History Reviewed:  Reviewed past medical,surgical, social, obstetrical and family history.  Reviewed problem list, medications and allergies. Physical Assessment:   Vitals:   05/08/19 1410  BP: 130/90  Pulse: 85  Weight: 243 lb 9.6 oz (110.5 kg)  Body mass index is 41.81 kg/m.  Blood pressure recheck 130/80      Physical Examination:   General appearance: Well appearing, and in no distress  Mental status: Alert, oriented to person, place, and time  Skin: Warm & dry  Cardiovascular: Normal heart rate noted  Respiratory: Normal respiratory effort, no distress  Abdomen: Soft, gravid, nontender  Pelvic: Cervical exam deferred         Extremities: Edema: Mild pitting, slight indentation  Fetal Status: Fetal Heart Rate (bpm): 36 Fundal Height: 131 cm Movement: Present    Results for orders placed or performed in visit on 05/08/19 (from the past 24 hour(s))  POC Urinalysis Dipstick OB   Collection Time: 05/08/19  2:24 PM   Result Value Ref Range   Color, UA     Clarity, UA     Glucose, UA Negative Negative   Bilirubin, UA     Ketones, UA neg    Spec Grav, UA     Blood, UA small    pH, UA     POC,PROTEIN,UA Negative Negative, Trace, Small (1+), Moderate (2+), Large (3+), 4+   Urobilinogen, UA     Nitrite, UA neg    Leukocytes, UA Negative Negative   Appearance     Odor      Assessment & Plan:  1) Low-risk pregnancy G2P1001 at [redacted]w[redacted]d with an Estimated Date of Delivery: 06/19/19   2) Hx of pre-e & PPHTN   Meds: No orders of the defined types were placed in this encounter.  Labs/procedures today: None  Plan:  1. Continue routine obstetrical care 2. F/u in 2 weeks with gbs/gcchl 3. Water excercise  Reviewed: Preterm labor symptoms and general obstetric precautions including but not limited to vaginal bleeding, contractions, leaking of fluid and fetal movement were reviewed in detail with the patient.  All questions were answered.   Follow-up: Return in about 2 weeks (around 05/22/2019).  Orders Placed This Encounter  Procedures  . POC Urinalysis Dipstick OB   By signing my name below, I, Samul Dada, attest that this documentation has been prepared under the direction and in the presence of Jonnie Kind, MD. Electronically Signed: [place]. 05/08/19. 2:46 PM.  I personally performed the services described in this documentation, which was SCRIBED in my presence. The recorded  information has been reviewed and considered accurate. It has been edited as necessary during review. Jonnie Kind, MD

## 2019-05-09 ENCOUNTER — Other Ambulatory Visit: Payer: Self-pay

## 2019-05-09 ENCOUNTER — Encounter (HOSPITAL_COMMUNITY): Payer: Self-pay

## 2019-05-09 ENCOUNTER — Inpatient Hospital Stay (HOSPITAL_COMMUNITY)
Admission: AD | Admit: 2019-05-09 | Discharge: 2019-05-09 | Disposition: A | Discharge status: Home / Self Care | Attending: Obstetrics & Gynecology | Admitting: Obstetrics & Gynecology

## 2019-05-09 DIAGNOSIS — O26893 Other specified pregnancy related conditions, third trimester: Secondary | ICD-10-CM | POA: Diagnosis not present

## 2019-05-09 DIAGNOSIS — R109 Unspecified abdominal pain: Secondary | ICD-10-CM | POA: Diagnosis present

## 2019-05-09 DIAGNOSIS — Z3A34 34 weeks gestation of pregnancy: Secondary | ICD-10-CM

## 2019-05-09 DIAGNOSIS — Y9301 Activity, walking, marching and hiking: Secondary | ICD-10-CM | POA: Diagnosis not present

## 2019-05-09 DIAGNOSIS — W1830XA Fall on same level, unspecified, initial encounter: Secondary | ICD-10-CM | POA: Insufficient documentation

## 2019-05-09 DIAGNOSIS — Z3689 Encounter for other specified antenatal screening: Secondary | ICD-10-CM

## 2019-05-09 DIAGNOSIS — Z880 Allergy status to penicillin: Secondary | ICD-10-CM | POA: Insufficient documentation

## 2019-05-09 DIAGNOSIS — W19XXXA Unspecified fall, initial encounter: Secondary | ICD-10-CM | POA: Diagnosis not present

## 2019-05-09 MED ORDER — ACETAMINOPHEN 500 MG PO TABS
1000.0000 mg | ORAL_TABLET | Freq: Once | ORAL | Status: AC
  Administered 2019-05-09: 1000 mg via ORAL
  Filled 2019-05-09: qty 2

## 2019-05-09 NOTE — Progress Notes (Signed)
Reports she's sore, not in any pain.

## 2019-05-09 NOTE — Discharge Instructions (Signed)
Preventing Injuries During Pregnancy  Injuries can happen during pregnancy. Minor falls and accidents usually do not harm you or your baby. But some injuries can harm you and your baby. Tell your doctor about any injury you suffer. What can I do to avoid injuries? Safety  Remove rugs and loose objects on the floor.  Wear comfortable shoes that have a good grip. Do not wear shoes that have high heels.  Always wear your seat belt in the car. The lap belt should be below your belly. Always drive safely.  Do not ride on a motorcycle. Activity  Do not take part in rough and violent activities or sports.  Avoid: ? Walking on wet or slippery floors. ? Lifting heavy pots of boiling or hot liquids. ? Fixing electrical problems. ? Being near fires. General instructions  Take over-the-counter and prescription medicines only as told by your doctor.  Know your blood type and the blood type of the baby's father.  If you are a victim of domestic violence: ? Call your local emergency services (911 in the [place] ? Contact the [employer] Violence Hotline for help and support. Get help right away if:  You fall on your belly or receive any serious blow to your belly.  You have a stiff neck or neck pain after a fall or an injury.  You get a headache or have problems with vision after an injury.  You do not feel the baby move or the baby is not moving as much as normal.  You have been a victim of domestic violence or any other kind of attack.  You have been in a car accident.  You have bleeding from your vagina.  Fluid is leaking from your vagina.  You start to have cramping or pain in your belly (contractions).  You have very bad pain in your lower back.  You feel weak or pass out (faint).  You start to throw up (vomit) after an injury.  You have been burned. Summary  Some injuries that happen during pregnancy can do harm to the baby.  Tell your doctor about any  injury.  Take steps to avoid injury. This includes removing rugs and loose objects on the floor. Always wear your seat belt in the car.  Do not take part in rough and violent activities or sports.  Get help right away if you have any serious accident or injury. This information is not intended to replace advice given to you by your health care provider. Make sure you discuss any questions you have with your health care provider. Document Released: 10/29/2010 Document Revised: 10/05/2016 Document Reviewed: 10/05/2016 Elsevier Patient Education  2020 [employer].

## 2019-05-09 NOTE — MAU Note (Signed)
Presents with left side pain after fallling around 0500 today. Pt feel on left arm, abdomen, and leg. Pain 7/10. No LOF or vag bldg. No FM since the fall. Pt states,"felt a kick in MAU waiting area".   Gilmer Mor RN

## 2019-05-09 NOTE — MAU Provider Note (Addendum)
History     CSN: [id number]  Arrival date and time: 05/09/19 0509   First Provider Initiated Contact with Patient 05/09/19 [phone]      Chief Complaint  Patient presents with  . Abdominal Pain    left side of body after fall     Janet [name] is a 33 y.o. G2P1001 at [redacted]w[redacted]d who receives care at [facility].  She presents today for decreased fetal movement after falling on her abdomen.  Patient states she was walking into work and fell on the concrete.  She states she had not felt baby move, but has since felt movement.  Patient reports some soreness, but denies vaginal bleeding or leaking.  She also denies contractions.      OB History    Gravida  2   Para  1   Term  1   Preterm  0   AB  0   Living  1     SAB  0   TAB  0   Ectopic  0   Multiple  0   Live Births  1           Past Medical History:  Diagnosis Date  . 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 Carpal tunnel syndrome during pregnancy   . 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 Cushing's syndrome ([place]) 11/27/2013  . Gastroesophageal reflux disease   . Hematuria 09/18/2015  . Menstrual periods irregular 09/18/2015  . Migraine without aura, with intractable migraine, so stated, without mention of status migrainosus 11/11/2013  . Patient desires pregnancy 11/27/2013  . Pregnant 12/16/2015  . Vitamin D deficiency     Past Surgical History:  Procedure Laterality Date  . CHOLECYSTECTOMY  02/2015  . COLONOSCOPY N/A 04/19/2013   TDD:UKGURK mucosa in the terminal ileum/Single erosion in transverse colon/RECTAL BLEEDING DUE TO Moderate sized internal hemorrhoids/ trv colon erosion, bx benign.  . ESOPHAGOGASTRODUODENOSCOPY N/A 04/19/2013   YHC:WCBJ Non-erosive gastritis/PERI-UMBILICAL PAIN DUE TO GERD, GASTRITIS, AND CONSTIPATION. Bx with mild chronic inactive gastritis. No H.Pylori  . HEMORRHOID  BANDING  2015   Dr. Gala Romney- in office banding procedure  . TONSILLECTOMY    . 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 Hypertension Mother   . Hyperlipidemia Mother   . Hypertension Father   . Hypertension Maternal Grandmother   . Diabetes Maternal Grandmother   . Diabetes Paternal Grandmother   . COPD Paternal Grandfather   . Heart disease Paternal Grandfather   . Colon cancer Other        maternal great uncle  . Stroke Maternal Aunt   . Migraines Maternal Aunt     Social History   Tobacco Use  . Smoking status: Never Smoker  . Smokeless tobacco: Never Used  Substance Use Topics  . Alcohol use: No  . Drug use: No    Allergies:  Allergies  Allergen Reactions  . Methylprednisolone Shortness Of Breath, Itching and Other (See Comments)    Reaction:  Bruising   . Penicillins Anaphylaxis, Rash and Other (See Comments)    Has patient had a PCN reaction causing immediate rash, facial/tongue/throat swelling, SOB or lightheadedness with hypotension: No Has patient had a PCN reaction causing severe rash involving mucus membranes or skin necrosis: No Has patient had a  PCN reaction that required hospitalization No Has patient had a PCN reaction occurring within the last 10 years: No If all of the above answers are "NO", then may proceed with Cephalosporin use.  . Latex Rash  . Neomycin Rash    Breaks out with neosporin    Medications Prior to Admission  Medication Sig Dispense Refill Last Dose  . Ferrous Sulfate (IRON PO) Take 325 mg by mouth daily.     Marland Kitchen omeprazole (PRILOSEC) 20 MG capsule Take 20 mg by mouth daily.     . prenatal vitamin w/FE, FA (PRENATAL 1 + 1) 27-1 MG TABS tablet Take 1 tablet by mouth daily at 12 noon. 30 each 12     Review of Systems  Gastrointestinal: Positive for abdominal pain (Soreness).  Genitourinary: Negative for vaginal bleeding and vaginal discharge.   Physical Exam   Blood pressure  107/75, pulse (!) 125, temperature 99.8 F (37.7 C), temperature source Oral, resp. rate 16, height 5\' 4"  (1.626 m), weight 111.6 kg, last menstrual period 09/12/2018, SpO2 99 %.  Physical Exam  Constitutional: She is oriented to person, place, and time. She appears well-developed and well-nourished.  HENT:  Head: Normocephalic and atraumatic.  Eyes: Conjunctivae are normal.  Neck: Normal range of motion.  Cardiovascular: Normal rate.  Respiratory: Effort normal.  GI: Soft.  Musculoskeletal: Normal range of motion.  Neurological: She is alert and oriented to person, place, and time.  Psychiatric: She has a normal mood and affect. Her behavior is normal.    Fetal Assessment 135 bpm, Mod Var, -Decels, +10x10Accels Toco: None graphed  MAU Course   Results for orders placed or performed in visit on 05/08/19 (from the past 24 hour(s))  POC Urinalysis Dipstick OB     Status: None   Collection Time: 05/08/19  2:24 PM  Result Value Ref Range   Color, UA     Clarity, UA     Glucose, UA Negative Negative   Bilirubin, UA     Ketones, UA neg    Spec Grav, UA     Blood, UA small    pH, UA     POC,PROTEIN,UA Negative Negative, Trace, Small (1+), Moderate (2+), Large (3+), 4+   Urobilinogen, UA     Nitrite, UA neg    Leukocytes, UA Negative Negative   Appearance     Odor     No results found.  MDM  EFM  33 year old G2P1001  SIUP at 34.1weeks Cat I FT S/P Fall with Abdominal Trauma   -Discussed extended fetal monitoring -Offered and accepts pain medication for aches; tylenol ordered -NST reassuring, but not reactive d/t inadequate accels -Will continue to monitor  Reassessment (6:54 AM)  135 bpm, Mod Var, -Decels, +Accels Toco: None graphed  Cat I FT  NST Reactive Will continue to monitor  Reassessment (7:58 AM)  135 bpm, Mod Var, -Decels, +Accels Toco: None graphed  Cat I FT  NST Reactive Will continue to monitor Report given to S. Jeronimo Greaves, CNM for  assumption of care  Maryann Conners MSN, CNM 05/09/2019, 5:58 AM   Assessment and Plan  --33 y.o. G2P1001 at [redacted]w[redacted]d --S/p fall at 0500 today --S/p 4 hours continuous monitoring. Category I reactive tracing --Baseline 130, mod variability, positive 15 x 15 accels, no decels --Toco: UI --Patient's pain resolved with PO Tylenol given at 0630 --Discharge home in stable condition with fall/abruption precautions  F/U: [place] 05/21/19  Mallie Snooks, MSN, CNM Certified Nurse Midwife, Faculty  Practice 05/09/19 10:44 AM

## 2019-05-21 ENCOUNTER — Other Ambulatory Visit: Payer: Self-pay

## 2019-05-21 ENCOUNTER — Encounter: Payer: Self-pay | Admitting: Women's Health

## 2019-05-21 ENCOUNTER — Ambulatory Visit (INDEPENDENT_AMBULATORY_CARE_PROVIDER_SITE_OTHER): Payer: 59 | Admitting: Women's Health

## 2019-05-21 VITALS — BP 151/99 | HR 85 | Wt 250.0 lb

## 2019-05-21 DIAGNOSIS — Z3A35 35 weeks gestation of pregnancy: Secondary | ICD-10-CM | POA: Diagnosis not present

## 2019-05-21 DIAGNOSIS — Z3483 Encounter for supervision of other normal pregnancy, third trimester: Secondary | ICD-10-CM

## 2019-05-21 DIAGNOSIS — O133 Gestational [pregnancy-induced] hypertension without significant proteinuria, third trimester: Secondary | ICD-10-CM | POA: Diagnosis not present

## 2019-05-21 DIAGNOSIS — O0990 Supervision of high risk pregnancy, unspecified, unspecified trimester: Secondary | ICD-10-CM

## 2019-05-21 DIAGNOSIS — O1490 Unspecified pre-eclampsia, unspecified trimester: Secondary | ICD-10-CM | POA: Insufficient documentation

## 2019-05-21 DIAGNOSIS — O0993 Supervision of high risk pregnancy, unspecified, third trimester: Secondary | ICD-10-CM | POA: Diagnosis not present

## 2019-05-21 DIAGNOSIS — Z331 Pregnant state, incidental: Secondary | ICD-10-CM

## 2019-05-21 DIAGNOSIS — Z1389 Encounter for screening for other disorder: Secondary | ICD-10-CM

## 2019-05-21 DIAGNOSIS — R7989 Other specified abnormal findings of blood chemistry: Secondary | ICD-10-CM

## 2019-05-21 HISTORY — DX: Unspecified pre-eclampsia, unspecified trimester: O14.90

## 2019-05-21 LAB — POCT URINALYSIS DIPSTICK OB
Glucose, UA: NEGATIVE
Ketones, UA: NEGATIVE
Leukocytes, UA: NEGATIVE
Nitrite, UA: NEGATIVE
POC,PROTEIN,UA: NEGATIVE

## 2019-05-21 LAB — OB RESULTS CONSOLE GBS: GBS: POSITIVE

## 2019-05-21 LAB — OB RESULTS CONSOLE GC/CHLAMYDIA: Gonorrhea: NEGATIVE

## 2019-05-21 NOTE — Progress Notes (Signed)
HIGH-RISK PREGNANCY VISIT Patient name: Janet [name] MRN [id number]  Date of birth: 02/08/1986 Chief Complaint:   Routine Prenatal Visit (headaches; GBS, GC/CHL)  History of Present Illness:   Janet [name] is a 33 y.o. G42P1001 female at [redacted]w[redacted]d with an Estimated Date of Delivery: 06/19/19 being seen today for ongoing management of a high-risk pregnancy complicated by gestational hypertension currently on no meds, dx today. H/O pre-e, PPHTN, shoulder dystocia, PPH, chiari malformation Today she reports headaches, go away w/ apap. Denies visual changes, ruq/epigastric pain, n/v. Home bp's 120-130s/80s (one DBP 107). Has h/o low VitD, stopped taking when pregnant, wants to recheck.   Contractions: Not present. Vag. Bleeding: None.  Movement: Present. denies leaking of fluid.  Review of Systems:   Pertinent items are noted in HPI Denies abnormal vaginal discharge w/ itching/odor/irritation, headaches, visual changes, shortness of breath, chest pain, abdominal pain, severe nausea/vomiting, or problems with urination or bowel movements unless otherwise stated above. Pertinent History Reviewed:  Reviewed past medical,surgical, social, obstetrical and family history.  Reviewed problem list, medications and allergies. Physical Assessment:   Vitals:   05/21/19 1445 05/21/19 1452  BP: (!) 148/97 (!) 151/99  Pulse: 85 85  Weight: 250 lb (113.4 kg)   Body mass index is 42.91 kg/m.           Physical Examination:   General appearance: alert, well appearing, and in no distress  Mental status: alert, oriented to person, place, and time  Skin: warm & dry   Extremities: Edema: Moderate pitting, indentation subsides rapidly    Cardiovascular: normal heart rate noted  Respiratory: normal respiratory effort, no distress  Abdomen: gravid, soft, non-tender  Pelvic: Cervical exam deferred         Fetal Status: Fetal Heart Rate (bpm): 135   Movement: Present Presentation: Vertex  Fetal Surveillance  Testing today: NST: FHR baseline 135 bpm, Variability: moderate, Accelerations:present, Decelerations:  Absent= Cat 1/Reactive Toco: none  Informal transabdominal u/s: vtx     Results for orders placed or performed in visit on 05/21/19 (from the past 24 hour(s))  POC Urinalysis Dipstick OB   Collection Time: 05/21/19  2:48 PM  Result Value Ref Range   Color, UA     Clarity, UA     Glucose, UA Negative Negative   Bilirubin, UA     Ketones, UA neg    Spec Grav, UA     Blood, UA 1+    pH, UA     POC,PROTEIN,UA Negative Negative, Trace, Small (1+), Moderate (2+), Large (3+), 4+   Urobilinogen, UA     Nitrite, UA neg    Leukocytes, UA Negative Negative   Appearance     Odor      Assessment & Plan:  1) High-risk pregnancy G2P1001 at [redacted]w[redacted]d with an Estimated Date of Delivery: 06/19/19   2) GHTN, dx today, no proteinuria, some headaches r/b apap, will get pre-e labs, IOL scheduled for 8/19 @ 0830. H/O pre-e and PPHTN.  Check bp QID, reviewed severe range bp's, pre-e s/s, reasons to seek care. IOL form faxed via Epic and orders placed   3) H/O shoulder dystocia, mild 15s w/ 7lb8.6oz baby, EFW Friday  4) H/O PPH  5) Chiari malformation> stable  6) H/O low Vit D> will repeat today per request  Meds: No orders of the defined types were placed in this encounter.  Labs/procedures today: nst, gbs, gc/ct, pre-e labs  Treatment Plan:  EFW/bpp/dopp Friday w/ MFM, then NST here next  Tues, IOL next Wed  Reviewed: Preterm labor symptoms and general obstetric precautions including but not limited to vaginal bleeding, contractions, leaking of fluid and fetal movement were reviewed in detail with the patient.  All questions were answered.   Follow-up: Return for Friday for efw/bpp/dopp w/ MFM, then Mon here NST/HROB.  Orders Placed This Encounter  Procedures  . GC/Chlamydia Probe Amp  . Strep Gp B NAA+Rflx  . CBC  . Comprehensive metabolic panel  . Protein / creatinine ratio, urine  .  VITAMIN D 25 Hydroxy (Vit-D Deficiency, Fractures)  . POC Urinalysis Dipstick OB   Roma Schanz CNM, [facility] 05/21/2019 5:07 PM

## 2019-05-21 NOTE — Treatment Plan (Signed)
Induction Assessment Scheduling Form Fax to [facility]:  [phone]  STEPHANNE GREELEY                                                                                   DOB:  Dec 11, 1985                                                            MRN:  [id number]                                                                     Phone #:   [phone] (Home Phone)  [phone] (Mobile)                    Provider:  [facility]  GP:  [id number]                                                            Estimated Date of Delivery: 06/19/19  Dating Criteria: LMP c/w 7wk u/s    Medical Indications for induction:  GHTN Admission Date/Time:  8/19 @ 0830 Gestational age on admission:  37.0   Filed Weights   05/21/19 1445  Weight: 250 lb (113.4 kg)   HIV:  Non Reactive (06/16 0845) GBS:  pending    Method of induction(proposed):  cytotec   Scheduling Provider Signature:  Roma Schanz, CNM                                            Today's Date:  05/21/2019

## 2019-05-21 NOTE — Patient Instructions (Addendum)
Janet [name], I greatly value your feedback.  If you receive a survey following your visit with [place] today, we appreciate you taking the time to fill it out.  Thanks, [name], CNM, [facility]  [place]!!! It is now [place] at [facility] ([place], [place] [address]) Entrance located off of [address] parking   Go to [email or web address] to register for FREE online childbirth classes    Call the office [phone]) or go to [facility] if:  You begin to have strong, frequent contractions  Your water breaks.  Sometimes it is a big gush of fluid, sometimes it is just a trickle that keeps getting your panties wet or running down your legs  You have vaginal bleeding.  It is normal to have a small amount of spotting if your cervix was checked.   You don't feel your baby moving like normal.  If you don't, get you something to eat and drink and lay down and focus on feeling your baby move.  You should feel at least 10 movements in 2 hours.  If you don't, you should call the office or go to [facility].   Call the office [phone]) or go to [facility] hospital for these signs of pre-eclampsia:  Severe headache that does not go away with Tylenol  Visual changes- seeing spots, double, blurred vision  Pain under your right breast or upper abdomen that does not go away with Tums or heartburn medicine  Nausea and/or vomiting  Severe swelling in your hands, feet, and face      Home Blood Pressure Monitoring for Patients   Check your blood pressure 4 times daily and keep a log, bring this with you to all appointments.  If blood pressure is >=160 on top or >=110 on bottom, check again in 5 minutes, if still this high, call us or go to [facility] to be evaluated   Helpful Tips for Accurate Home Blood Pressure Checks  . Don't smoke, exercise, or drink caffeine 30 minutes before checking your BP . Use the restroom  before checking your BP (a full bladder can raise your pressure) . Relax in a comfortable upright chair . Feet on the ground . Left arm resting comfortably on a flat surface at the level of your heart . Legs uncrossed . Back supported . Sit quietly and don't talk . Place the cuff on your bare arm . Adjust snuggly, so that only two fingertips can fit between your skin and the top of the cuff . Check 2 readings separated by at least one minute . Keep a log of your BP readings . For a visual, please reference this diagram: http://ccnc.care/bpdiagram  Provider Name: [facility]     Phone: [phone]  Zone 1: ALL CLEAR  Continue to monitor your symptoms:  . BP reading is less than 140 (top number) or less than 90 (bottom number)  . No right upper stomach pain . No headaches or seeing spots . No feeling nauseated or throwing up . No swelling in face and hands  Zone 2: CAUTION Call your doctor's office for any of the following:  . BP reading is greater than 140 (top number) or greater than 90 (bottom number)  . Stomach pain under your ribs in the middle or right side . Headaches or seeing spots . Feeling nauseated or throwing up . Swelling in face and hands  Zone 3: EMERGENCY  Seek immediate medical care if you have any of the following:  . BP reading is greater than160 (top number) or greater than 110 (bottom number) . Severe headaches not improving with Tylenol . Serious difficulty catching your breath . Any worsening symptoms from Zone 2    Preeclampsia and Eclampsia Preeclampsia is a serious condition that may develop during pregnancy. This condition causes high blood pressure and increased protein in your urine along with other symptoms, such as headaches and vision changes. These symptoms may develop as the condition gets worse. Preeclampsia may occur at 20 weeks of pregnancy or later. Diagnosing and treating preeclampsia early is very important. If not treated early,  it can cause serious problems for you and your baby. One problem it can lead to is eclampsia. Eclampsia is a condition that causes muscle jerking or shaking (convulsions or seizures) and other serious problems for the mother. During pregnancy, delivering your baby may be the best treatment for preeclampsia or eclampsia. For most women, preeclampsia and eclampsia symptoms go away after giving birth. In rare cases, a woman may develop preeclampsia after giving birth (postpartum preeclampsia). This usually occurs within 48 hours after childbirth but may occur up to 6 weeks after giving birth. What are the causes? The cause of preeclampsia is not known. What increases the risk? The following risk factors make you more likely to develop preeclampsia:  Being pregnant for the first time.  Having had preeclampsia during a past pregnancy.  Having a family history of preeclampsia.  Having high blood pressure.  Being pregnant with more than one baby.  Being 66 or older.  Being African-American.  Having kidney disease or diabetes.  Having medical conditions such as lupus or blood diseases.  Being very overweight (obese). What are the signs or symptoms? The most common symptoms are:  Severe headaches.  Vision problems, such as blurred or double vision.  Abdominal pain, especially upper abdominal pain. Other symptoms that may develop as the condition gets worse include:  Sudden weight gain.  Sudden swelling of the hands, face, legs, and feet.  Severe nausea and vomiting.  Numbness in the face, arms, legs, and feet.  Dizziness.  Urinating less than usual.  Slurred speech.  Convulsions or seizures. How is this diagnosed? There are no screening tests for preeclampsia. Your health care provider will ask you about symptoms and check for signs of preeclampsia during your prenatal visits. You may also have tests that include:  Checking your blood pressure.  Urine tests to check for  protein. Your health care provider will check for this at every prenatal visit.  Blood tests.  Monitoring your baby's heart rate.  Ultrasound. How is this treated? You and your health care provider will determine the treatment approach that is best for you. Treatment may include:  Having more frequent prenatal exams to check for signs of preeclampsia, if you have an increased risk for preeclampsia.  Medicine to lower your blood pressure.  Staying in the hospital, if your condition is severe. There, treatment will focus on controlling your blood pressure and the amount of fluids in your body (fluid retention).  Taking medicine (magnesium sulfate) to prevent seizures. This may be given as an injection or through an IV.  Taking a low-dose aspirin during your pregnancy.  Delivering your baby early. You may have your labor started with medicine (induced), or you may have a cesarean delivery. Follow these instructions at home: Eating and drinking   Drink enough fluid to keep your  urine pale yellow.  Avoid caffeine. Lifestyle  Do not use any products that contain nicotine or tobacco, such as cigarettes and e-cigarettes. If you need help quitting, ask your health care provider.  Do not use alcohol or drugs.  Avoid stress as much as possible. Rest and get plenty of sleep. General instructions  Take over-the-counter and prescription medicines only as told by your health care provider.  When lying down, lie on your left side. This keeps pressure off your major blood vessels.  When sitting or lying down, raise (elevate) your feet. Try putting some pillows underneath your lower legs.  Exercise regularly. Ask your health care provider what kinds of exercise are best for you.  Keep all follow-up and prenatal visits as told by your health care provider. This is important. How is this prevented? There is no known way of preventing preeclampsia or eclampsia from developing. However, to  lower your risk of complications and detect problems early:  Get regular prenatal care. Your health care provider may be able to diagnose and treat the condition early.  Maintain a healthy weight. Ask your health care provider for help managing weight gain during pregnancy.  Work with your health care provider to manage any long-term (chronic) health conditions you have, such as diabetes or kidney problems.  You may have tests of your blood pressure and kidney function after giving birth.  Your health care provider may have you take low-dose aspirin during your next pregnancy. Contact a health care provider if:  You have symptoms that your health care provider told you may require more treatment or monitoring, such as: ? Headaches. ? Nausea or vomiting. ? Abdominal pain. ? Dizziness. ? Light-headedness. Get help right away if:  You have severe: ? Abdominal pain. ? Headaches that do not get better. ? Dizziness. ? Vision problems. ? Confusion. ? Nausea or vomiting.  You have any of the following: ? A seizure. ? Sudden, rapid weight gain. ? Sudden swelling in your hands, ankles, or face. ? Trouble moving any part of your body. ? Numbness in any part of your body. ? Trouble speaking. ? Abnormal bleeding.  You faint. Summary  Preeclampsia is a serious condition that may develop during pregnancy.  This condition causes high blood pressure and increased protein in your urine along with other symptoms, such as headaches and vision changes.  Diagnosing and treating preeclampsia early is very important. If not treated early, it can cause serious problems for you and your baby.  Get help right away if you have symptoms that your health care provider told you to watch for. This information is not intended to replace advice given to you by your health care provider. Make sure you discuss any questions you have with your health care provider. Document Released: 09/23/2000 Document  Revised: 05/29/2018 Document Reviewed: 05/02/2016 Elsevier Patient Education  2020 [employer].

## 2019-05-22 LAB — COMPREHENSIVE METABOLIC PANEL
ALT: 11 IU/L (ref 0–32)
AST: 17 IU/L (ref 0–40)
Albumin/Globulin Ratio: 1.1 — ABNORMAL LOW (ref 1.2–2.2)
Albumin: 3.1 g/dL — ABNORMAL LOW (ref 3.8–4.8)
Alkaline Phosphatase: 119 IU/L — ABNORMAL HIGH (ref 39–117)
BUN/Creatinine Ratio: 17 (ref 9–23)
BUN: 8 mg/dL (ref 6–20)
Bilirubin Total: <0.2 mg/dL (ref 0.0–1.2)
CO2: 21 mmol/L (ref 20–29)
Calcium: 8.4 mg/dL — ABNORMAL LOW (ref 8.7–10.2)
Chloride: 103 mmol/L (ref 96–106)
Creatinine, Ser: 0.46 mg/dL — ABNORMAL LOW (ref 0.57–1.00)
GFR calc Af Amer: 151 mL/min/{1.73_m2} (ref >59)
GFR calc non Af Amer: 131 mL/min/{1.73_m2} (ref >59)
Globulin, Total: 2.8 g/dL (ref 1.5–4.5)
Glucose: 76 mg/dL (ref 65–99)
Potassium: 4 mmol/L (ref 3.5–5.2)
Sodium: 137 mmol/L (ref 134–144)
Total Protein: 5.9 g/dL — ABNORMAL LOW (ref 6.0–8.5)

## 2019-05-22 LAB — CBC
Hematocrit: 34.8 % (ref 34.0–46.6)
Hemoglobin: 11.3 g/dL (ref 11.1–15.9)
MCH: 28.3 pg (ref 26.6–33.0)
MCHC: 32.5 g/dL (ref 31.5–35.7)
MCV: 87 fL (ref 79–97)
Platelets: 300 10*3/uL (ref 150–450)
RBC: 4 x10E6/uL (ref 3.77–5.28)
RDW: 13.3 % (ref 11.7–15.4)
WBC: 9.3 10*3/uL (ref 3.4–10.8)

## 2019-05-22 LAB — PROTEIN / CREATININE RATIO, URINE

## 2019-05-22 LAB — VITAMIN D 25 HYDROXY (VIT D DEFICIENCY, FRACTURES): Vit D, 25-Hydroxy: 19.4 ng/mL — ABNORMAL LOW (ref 30.0–100.0)

## 2019-05-24 ENCOUNTER — Other Ambulatory Visit: Payer: Self-pay

## 2019-05-24 ENCOUNTER — Ambulatory Visit (INDEPENDENT_AMBULATORY_CARE_PROVIDER_SITE_OTHER): Payer: 59 | Admitting: *Deleted

## 2019-05-24 VITALS — BP 134/92 | HR 96 | Wt 244.0 lb

## 2019-05-24 DIAGNOSIS — O0990 Supervision of high risk pregnancy, unspecified, unspecified trimester: Secondary | ICD-10-CM

## 2019-05-24 DIAGNOSIS — O0993 Supervision of high risk pregnancy, unspecified, third trimester: Secondary | ICD-10-CM

## 2019-05-24 DIAGNOSIS — Z331 Pregnant state, incidental: Secondary | ICD-10-CM

## 2019-05-24 DIAGNOSIS — Z3A35 35 weeks gestation of pregnancy: Secondary | ICD-10-CM | POA: Diagnosis not present

## 2019-05-24 DIAGNOSIS — O133 Gestational [pregnancy-induced] hypertension without significant proteinuria, third trimester: Secondary | ICD-10-CM | POA: Diagnosis not present

## 2019-05-24 DIAGNOSIS — Z1389 Encounter for screening for other disorder: Secondary | ICD-10-CM

## 2019-05-24 LAB — POCT URINALYSIS DIPSTICK OB
Glucose, UA: NEGATIVE
Ketones, UA: NEGATIVE
Leukocytes, UA: NEGATIVE
Nitrite, UA: NEGATIVE

## 2019-05-24 NOTE — Progress Notes (Signed)
NST reviewed by DR Glo Herring

## 2019-05-25 LAB — PROTEIN / CREATININE RATIO, URINE
Creatinine, Urine: 123.5 mg/dL
Protein, Ur: 47.8 mg/dL
Protein/Creat Ratio: 387 mg/g creat — ABNORMAL HIGH (ref 0–200)

## 2019-05-25 LAB — GC/CHLAMYDIA PROBE AMP
Chlamydia trachomatis, NAA: NEGATIVE
Neisseria Gonorrhoeae by PCR: NEGATIVE

## 2019-05-27 ENCOUNTER — Encounter: Payer: Self-pay | Admitting: Women's Health

## 2019-05-27 ENCOUNTER — Ambulatory Visit (HOSPITAL_COMMUNITY)
Admission: RE | Admit: 2019-05-27 | Discharge: 2019-05-27 | Disposition: A | Discharge status: Home / Self Care | Payer: 59 | Source: Ambulatory Visit | Attending: Obstetrics and Gynecology | Admitting: Obstetrics and Gynecology

## 2019-05-27 ENCOUNTER — Other Ambulatory Visit: Payer: Self-pay | Admitting: Women's Health

## 2019-05-27 ENCOUNTER — Other Ambulatory Visit: Payer: Self-pay

## 2019-05-27 ENCOUNTER — Ambulatory Visit (INDEPENDENT_AMBULATORY_CARE_PROVIDER_SITE_OTHER): Payer: 59 | Admitting: Women's Health

## 2019-05-27 ENCOUNTER — Ambulatory Visit (INDEPENDENT_AMBULATORY_CARE_PROVIDER_SITE_OTHER): Payer: 59

## 2019-05-27 VITALS — BP 138/92 | HR 98 | Wt 245.0 lb

## 2019-05-27 DIAGNOSIS — O09299 Supervision of pregnancy with other poor reproductive or obstetric history, unspecified trimester: Secondary | ICD-10-CM

## 2019-05-27 DIAGNOSIS — Z3A36 36 weeks gestation of pregnancy: Secondary | ICD-10-CM

## 2019-05-27 DIAGNOSIS — Z1389 Encounter for screening for other disorder: Secondary | ICD-10-CM

## 2019-05-27 DIAGNOSIS — O09293 Supervision of pregnancy with other poor reproductive or obstetric history, third trimester: Secondary | ICD-10-CM

## 2019-05-27 DIAGNOSIS — O10919 Unspecified pre-existing hypertension complicating pregnancy, unspecified trimester: Secondary | ICD-10-CM

## 2019-05-27 DIAGNOSIS — Z331 Pregnant state, incidental: Secondary | ICD-10-CM

## 2019-05-27 DIAGNOSIS — O0993 Supervision of high risk pregnancy, unspecified, third trimester: Secondary | ICD-10-CM

## 2019-05-27 DIAGNOSIS — O10913 Unspecified pre-existing hypertension complicating pregnancy, third trimester: Secondary | ICD-10-CM

## 2019-05-27 DIAGNOSIS — O1493 Unspecified pre-eclampsia, third trimester: Secondary | ICD-10-CM

## 2019-05-27 LAB — STREP GP B SUSCEPTIBILITY

## 2019-05-27 LAB — POCT URINALYSIS DIPSTICK OB
Glucose, UA: NEGATIVE
Ketones, UA: NEGATIVE
Leukocytes, UA: NEGATIVE
Nitrite, UA: NEGATIVE
POC,PROTEIN,UA: NEGATIVE

## 2019-05-27 LAB — STREP GP B NAA+RFLX: Strep Gp B NAA+Rflx: POSITIVE — AB

## 2019-05-27 NOTE — Patient Instructions (Signed)
Janet [name], I greatly value your feedback.  If you receive a survey following your visit with [place] today, we appreciate you taking the time to fill it out.  Thanks, Janet [name], CNM, [facility]  [place]!!! It is now [place] at [facility] ([place], [place] [address]) Entrance located off of [place] parking   Go to [email or web address] to register for FREE online childbirth classes    Call the office [phone]) or go to [facility] if:  You begin to have strong, frequent contractions  Your water breaks.  Sometimes it is a big gush of fluid, sometimes it is just a trickle that keeps getting your panties wet or running down your legs  You have vaginal bleeding.  It is normal to have a small amount of spotting if your cervix was checked.   You don't feel your baby moving like normal.  If you don't, get you something to eat and drink and lay down and focus on feeling your baby move.  You should feel at least 10 movements in 2 hours.  If you don't, you should call the office or go to [facility].   Call the office [phone]) or go to [facility] hospital for these signs of pre-eclampsia:  Severe headache that does not go away with Tylenol  Visual changes- seeing spots, double, blurred vision  Pain under your right breast or upper abdomen that does not go away with Tums or heartburn medicine  Nausea and/or vomiting  Severe swelling in your hands, feet, and face      Home Blood Pressure Monitoring for Patients   Your provider has recommended that you check your blood pressure (BP) at least once a week at home. If you do not have a blood pressure cuff at home, one will be provided for you. Contact your provider if you have not received your monitor within 1 week.   Helpful Tips for Accurate Home Blood Pressure Checks  . Don't smoke, exercise, or drink caffeine 30 minutes before checking your BP . Use the  restroom before checking your BP (a full bladder can raise your pressure) . Relax in a comfortable upright chair . Feet on the ground . Left arm resting comfortably on a flat surface at the level of your heart . Legs uncrossed . Back supported . Sit quietly and don't talk . Place the cuff on your bare arm . Adjust snuggly, so that only two fingertips can fit between your skin and the top of the cuff . Check 2 readings separated by at least one minute . Keep a log of your BP readings . For a visual, please reference this diagram: http://ccnc.care/bpdiagram  Provider Name: [facility]     Phone: [phone]  Zone 1: ALL CLEAR  Continue to monitor your symptoms:  . BP reading is less than 140 (top number) or less than 90 (bottom number)  . No right upper stomach pain . No headaches or seeing spots . No feeling nauseated or throwing up . No swelling in face and hands  Zone 2: CAUTION Call your doctor's office for any of the following:  . BP reading is greater than 140 (top number) or greater than 90 (bottom number)  . Stomach pain under your ribs in the middle or right side . Headaches or seeing spots . Feeling nauseated or throwing up . Swelling in face and hands  Zone 3: EMERGENCY  Seek  immediate medical care if you have any of the following:  . BP reading is greater than160 (top number) or greater than 110 (bottom number) . Severe headaches not improving with Tylenol . Serious difficulty catching your breath . Any worsening symptoms from Zone 2

## 2019-05-27 NOTE — Progress Notes (Signed)
HIGH-RISK PREGNANCY VISIT Patient name: Janet [name] MRN [id number]  Date of birth: 09-28-1986 Chief Complaint:   Routine Prenatal Visit ([place])  History of Present Illness:   Janet [name] is a 33 y.o. G68P1001 female at [redacted]w[redacted]d with an Estimated Date of Delivery: 06/19/19 being seen today for ongoing management of a high-risk pregnancy complicated by pre-eclampsia officially dx today based on bp's & ^ P:C 8/14, h/o pre-e, PPHTN, 15s shoulder dystocia, PPH, chiari malformation Today she reports no complaints. Home bp's normal, denies ha, visual changes, ruq/epigastric pain, n/v.  Really wants to hold IOL off until 38wks if possible.  Contractions: Not present. Vag. Bleeding: None.  Movement: Present. denies leaking of fluid.  Review of Systems:   Pertinent items are noted in HPI Denies abnormal vaginal discharge w/ itching/odor/irritation, headaches, visual changes, shortness of breath, chest pain, abdominal pain, severe nausea/vomiting, or problems with urination or bowel movements unless otherwise stated above. Pertinent History Reviewed:  Reviewed past medical,surgical, social, obstetrical and family history.  Reviewed problem list, medications and allergies. Physical Assessment:   Vitals:   05/27/19 1530  BP: (!) 138/92  Pulse: 98  Weight: 245 lb (111.1 kg)  Body mass index is 42.05 kg/m.           Physical Examination:   General appearance: alert, well appearing, and in no distress  Mental status: alert, oriented to person, place, and time  Skin: warm & dry   Extremities: Edema: None    Cardiovascular: normal heart rate noted  Respiratory: normal respiratory effort, no distress  Abdomen: gravid, soft, non-tender  Pelvic: Cervical exam deferred         Fetal Status:     Movement: Present    Fetal Surveillance Testing today: [place] 44+3 wks,cephalic,fhr 154 bpm,anterior placenta gr 3,afi 12.5 cm,RI .54,.60,.54,.66=69%,BPP 8/8,EFW 3771 G 98%  Results for orders placed or  performed in visit on 05/27/19 (from the past 24 hour(s))  POC Urinalysis Dipstick OB   Collection Time: 05/27/19  3:38 PM  Result Value Ref Range   Color, UA     Clarity, UA     Glucose, UA Negative Negative   Bilirubin, UA     Ketones, UA neg    Spec Grav, UA     Blood, UA small    pH, UA     POC,PROTEIN,UA Negative Negative, Trace, Small (1+), Moderate (2+), Large (3+), 4+   Urobilinogen, UA     Nitrite, UA neg    Leukocytes, UA Negative Negative   Appearance     Odor      Assessment & Plan:  1) High-risk pregnancy G2P1001 at [redacted]w[redacted]d with an Estimated Date of Delivery: 06/19/19   2) Pre-e, officially dx today, dx GHTN on 8/11, P:C returned elevated at 0.387, rest of labs were normal. Asymptomatic. Home bp's normal. Already has IOL scheduled for Wed 8/19, really wants to hold off until 38wks if possible. Discussed w/ LHE, who also discussed w/ pt the necessity for IOL @ 37wks w/ pre-e, pt understands. Reviewed pre-e s/s, severe range bp's, reasons to seek care  3) H/O PPHTN  4) H/O 15s shoulder dystocia w/ 7lb8.6oz (3420g) baby resolved by McRobert's & suprapubic. Today's EFW 98%/3771g, discussed w/ LHE, ok to proceed w/ plan for vag birth  5) GBS pos w/ PCN allergy> sensitivities still pending  Meds: No orders of the defined types were placed in this encounter.  Labs/procedures today: u/s  Treatment Plan:  IOL 8/19 as scheduled  Reviewed:  Term labor symptoms and general obstetric precautions including but not limited to vaginal bleeding, contractions, leaking of fluid and fetal movement were reviewed in detail with the patient.  All questions were answered.   Follow-up: No follow-ups on file.  Orders Placed This Encounter  Procedures  . POC Urinalysis Dipstick OB   Roma Schanz CNM, [facility] 05/27/2019 4:21 PM

## 2019-05-27 NOTE — Progress Notes (Signed)
[place] 01+4 wks,cephalic,fhr 159 bpm,anterior placenta gr 3,afi 12.5 cm,RI .54,.60,.54,.66=69%,BPP 8/8,EFW 3771 G 98%

## 2019-05-28 ENCOUNTER — Other Ambulatory Visit: Payer: 59 | Admitting: Women's Health

## 2019-05-28 ENCOUNTER — Other Ambulatory Visit: Payer: Self-pay | Admitting: Women's Health

## 2019-05-29 ENCOUNTER — Encounter (HOSPITAL_COMMUNITY): Payer: Self-pay

## 2019-05-29 ENCOUNTER — Inpatient Hospital Stay (HOSPITAL_COMMUNITY): Payer: 59

## 2019-05-29 ENCOUNTER — Inpatient Hospital Stay (HOSPITAL_COMMUNITY)
Admission: AD | Admit: 2019-05-29 | Discharge: 2019-06-02 | DRG: 805 | Disposition: A | Discharge status: Home / Self Care | Payer: 59 | Attending: Obstetrics & Gynecology | Admitting: Obstetrics & Gynecology

## 2019-05-29 ENCOUNTER — Other Ambulatory Visit: Payer: Self-pay

## 2019-05-29 DIAGNOSIS — Z20828 Contact with and (suspected) exposure to other viral communicable diseases: Secondary | ICD-10-CM | POA: Diagnosis present

## 2019-05-29 DIAGNOSIS — G935 Compression of brain: Secondary | ICD-10-CM | POA: Diagnosis present

## 2019-05-29 DIAGNOSIS — B951 Streptococcus, group B, as the cause of diseases classified elsewhere: Secondary | ICD-10-CM | POA: Diagnosis present

## 2019-05-29 DIAGNOSIS — Z3A37 37 weeks gestation of pregnancy: Secondary | ICD-10-CM

## 2019-05-29 DIAGNOSIS — O99214 Obesity complicating childbirth: Secondary | ICD-10-CM | POA: Diagnosis present

## 2019-05-29 DIAGNOSIS — O09299 Supervision of pregnancy with other poor reproductive or obstetric history, unspecified trimester: Secondary | ICD-10-CM

## 2019-05-29 DIAGNOSIS — O1490 Unspecified pre-eclampsia, unspecified trimester: Secondary | ICD-10-CM | POA: Diagnosis present

## 2019-05-29 DIAGNOSIS — O1404 Mild to moderate pre-eclampsia, complicating childbirth: Principal | ICD-10-CM | POA: Diagnosis present

## 2019-05-29 DIAGNOSIS — O99824 Streptococcus B carrier state complicating childbirth: Secondary | ICD-10-CM | POA: Diagnosis present

## 2019-05-29 DIAGNOSIS — O99354 Diseases of the nervous system complicating childbirth: Secondary | ICD-10-CM | POA: Diagnosis present

## 2019-05-29 DIAGNOSIS — Z8759 Personal history of other complications of pregnancy, childbirth and the puerperium: Secondary | ICD-10-CM

## 2019-05-29 DIAGNOSIS — Z880 Allergy status to penicillin: Secondary | ICD-10-CM

## 2019-05-29 DIAGNOSIS — E669 Obesity, unspecified: Secondary | ICD-10-CM | POA: Diagnosis present

## 2019-05-29 DIAGNOSIS — O139 Gestational [pregnancy-induced] hypertension without significant proteinuria, unspecified trimester: Secondary | ICD-10-CM | POA: Insufficient documentation

## 2019-05-29 DIAGNOSIS — E663 Overweight: Secondary | ICD-10-CM | POA: Diagnosis present

## 2019-05-29 DIAGNOSIS — O1403 Mild to moderate pre-eclampsia, third trimester: Secondary | ICD-10-CM

## 2019-05-29 LAB — COMPREHENSIVE METABOLIC PANEL
ALT: 17 U/L (ref 0–44)
AST: 27 U/L (ref 15–41)
Albumin: 2.5 g/dL — ABNORMAL LOW (ref 3.5–5.0)
Alkaline Phosphatase: 116 U/L (ref 38–126)
Anion gap: 10 (ref 5–15)
BUN: 7 mg/dL (ref 6–20)
CO2: 20 mmol/L — ABNORMAL LOW (ref 22–32)
Calcium: 8.8 mg/dL — ABNORMAL LOW (ref 8.9–10.3)
Chloride: 108 mmol/L (ref 98–111)
Creatinine, Ser: 0.61 mg/dL (ref 0.44–1.00)
GFR calc Af Amer: >60 mL/min (ref >60)
GFR calc non Af Amer: >60 mL/min (ref >60)
Glucose, Bld: 93 mg/dL (ref 70–99)
Potassium: 3.1 mmol/L — ABNORMAL LOW (ref 3.5–5.1)
Sodium: 138 mmol/L (ref 135–145)
Total Bilirubin: 0.7 mg/dL (ref 0.3–1.2)
Total Protein: 6 g/dL — ABNORMAL LOW (ref 6.5–8.1)

## 2019-05-29 LAB — PROTEIN / CREATININE RATIO, URINE
Creatinine, Urine: 183.49 mg/dL
Protein Creatinine Ratio: 0.22 mg/mg{Cre} — ABNORMAL HIGH (ref 0.00–0.15)
Total Protein, Urine: 40 mg/dL

## 2019-05-29 LAB — CBC
HCT: 32.8 % — ABNORMAL LOW (ref 36.0–46.0)
Hemoglobin: 10.9 g/dL — ABNORMAL LOW (ref 12.0–15.0)
MCH: 28.3 pg (ref 26.0–34.0)
MCHC: 33.2 g/dL (ref 30.0–36.0)
MCV: 85.2 fL (ref 80.0–100.0)
Platelets: 290 10*3/uL (ref 150–400)
RBC: 3.85 MIL/uL — ABNORMAL LOW (ref 3.87–5.11)
RDW: 13.4 % (ref 11.5–15.5)
WBC: 7.7 10*3/uL (ref 4.0–10.5)
nRBC: 0 % (ref 0.0–0.2)

## 2019-05-29 LAB — TYPE AND SCREEN
ABO/RH(D): B POS
Antibody Screen: NEGATIVE

## 2019-05-29 LAB — RPR: RPR Ser Ql: NONREACTIVE

## 2019-05-29 LAB — ABO/RH: ABO/RH(D): B POS

## 2019-05-29 LAB — SARS CORONAVIRUS 2 BY RT PCR (HOSPITAL ORDER, PERFORMED IN ~~LOC~~ HOSPITAL LAB): SARS Coronavirus 2: NEGATIVE

## 2019-05-29 MED ORDER — OXYTOCIN 40 UNITS IN NORMAL SALINE INFUSION - SIMPLE MED
2.5000 [IU]/h | INTRAVENOUS | Status: DC
  Filled 2019-05-29: qty 1000

## 2019-05-29 MED ORDER — LABETALOL HCL 5 MG/ML IV SOLN: 40.0000 mg | INTRAVENOUS | Status: DC | PRN

## 2019-05-29 MED ORDER — VANCOMYCIN HCL IN DEXTROSE 1-5 GM/200ML-% IV SOLN
1000.0000 mg | Freq: Two times a day (BID) | INTRAVENOUS | Status: DC
  Administered 2019-05-29 – 2019-05-31 (×5): 1000 mg via INTRAVENOUS
  Filled 2019-05-29 (×6): qty 200

## 2019-05-29 MED ORDER — OXYTOCIN BOLUS FROM INFUSION
500.0000 mL | Freq: Once | INTRAVENOUS | Status: AC
  Administered 2019-06-01: 500 mL via INTRAVENOUS

## 2019-05-29 MED ORDER — OXYCODONE-ACETAMINOPHEN 5-325 MG PO TABS: 2.0000 | ORAL_TABLET | ORAL | Status: DC | PRN

## 2019-05-29 MED ORDER — VANCOMYCIN HCL 10 G IV SOLR
2000.0000 mg | Freq: Once | INTRAVENOUS | Status: AC
  Administered 2019-05-29: 2000 mg via INTRAVENOUS
  Filled 2019-05-29: qty 2000

## 2019-05-29 MED ORDER — LACTATED RINGERS IV SOLN
INTRAVENOUS | Status: DC
  Administered 2019-05-29 – 2019-05-31 (×6): via INTRAVENOUS

## 2019-05-29 MED ORDER — FLEET ENEMA 7-19 GM/118ML RE ENEM: 1.0000 | ENEMA | RECTAL | Status: DC | PRN

## 2019-05-29 MED ORDER — SOD CITRATE-CITRIC ACID 500-334 MG/5ML PO SOLN: 30.0000 mL | ORAL | Status: DC | PRN

## 2019-05-29 MED ORDER — MISOPROSTOL 25 MCG QUARTER TABLET
25.0000 ug | ORAL_TABLET | ORAL | Status: DC | PRN
  Administered 2019-05-29 (×3): 25 ug via VAGINAL
  Filled 2019-05-29 (×3): qty 1

## 2019-05-29 MED ORDER — LACTATED RINGERS IV SOLN: 500.0000 mL | INTRAVENOUS | Status: DC | PRN

## 2019-05-29 MED ORDER — LABETALOL HCL 5 MG/ML IV SOLN: 20.0000 mg | INTRAVENOUS | Status: DC | PRN

## 2019-05-29 MED ORDER — FENTANYL CITRATE (PF) 100 MCG/2ML IJ SOLN: 50.0000 ug | INTRAMUSCULAR | Status: DC | PRN

## 2019-05-29 MED ORDER — OXYCODONE-ACETAMINOPHEN 5-325 MG PO TABS: 1.0000 | ORAL_TABLET | ORAL | Status: DC | PRN

## 2019-05-29 MED ORDER — ONDANSETRON HCL 4 MG/2ML IJ SOLN: 4.0000 mg | Freq: Four times a day (QID) | INTRAMUSCULAR | Status: DC | PRN

## 2019-05-29 MED ORDER — LABETALOL HCL 5 MG/ML IV SOLN: 80.0000 mg | INTRAVENOUS | Status: DC | PRN

## 2019-05-29 MED ORDER — HYDROXYZINE HCL 50 MG PO TABS: 50.0000 mg | ORAL_TABLET | Freq: Four times a day (QID) | ORAL | Status: DC | PRN

## 2019-05-29 MED ORDER — LIDOCAINE HCL (PF) 1 % IJ SOLN: 30.0000 mL | INTRAMUSCULAR | Status: DC | PRN

## 2019-05-29 MED ORDER — TERBUTALINE SULFATE 1 MG/ML IJ SOLN: 0.2500 mg | Freq: Once | INTRAMUSCULAR | Status: DC | PRN

## 2019-05-29 MED ORDER — MISOPROSTOL 50MCG HALF TABLET
50.0000 ug | ORAL_TABLET | ORAL | Status: DC
  Administered 2019-05-29: 50 ug via ORAL
  Filled 2019-05-29: qty 1

## 2019-05-29 MED ORDER — ACETAMINOPHEN 325 MG PO TABS
650.0000 mg | ORAL_TABLET | ORAL | Status: DC | PRN
  Administered 2019-05-30 – 2019-05-31 (×2): 650 mg via ORAL
  Filled 2019-05-29 (×2): qty 2

## 2019-05-29 MED ORDER — HYDRALAZINE HCL 20 MG/ML IJ SOLN: 10.0000 mg | INTRAMUSCULAR | Status: DC | PRN

## 2019-05-29 NOTE — MAU Note (Signed)
Covid swab collected.Pt tolerated well. PT asymptomatic

## 2019-05-29 NOTE — Progress Notes (Addendum)
Patient ID: Janet [name], female   DOB: 1986/05/21, 33 y.o.   MRN: [id number] Doing well  Vitals:   05/29/19 1808 05/29/19 1858 05/29/19 2008 05/29/19 2135  BP: (!) 140/52 (!) 141/90 114/78 126/79  Pulse: 80 84 80 86  Resp: 18 18 18 18   Temp: 98.3 F (36.8 C)  98.7 F (37.1 C) 98.4 F (36.9 C)  TempSrc: Oral  Oral Oral  SpO2:    98%    FHR reassuring Ucs irregular  Dilation: Fingertip Effacement (%): 60 Cervical Position: Posterior Station: -3 Presentation: Vertex Exam by:: Hansel Feinstein, CNM  Cervix is very posterior I do not feel that internal os is open Will not try Foley just now  Continue Cytotec but change to 31mcg PO

## 2019-05-29 NOTE — Progress Notes (Addendum)
LABOR PROGRESS NOTE  Janet [name] is a 33 y.o. female G2P1001 with IUP at [redacted]w[redacted]d by LMP and 7wk [place] presenting for IOL secondary to Pre-E. She reports +FMs, No LOF, no VB, no blurry vision, headaches or peripheral edema, and RUQ pain.  She plans on breast feeding. She requests POP for birth control. She received her prenatal care at [facility]   Subjective: Pt doing well. Currently asymptomatic. Had questions about how long her delivery will take as her previous delivery took a long time and the baby was 41 weeks. I explained that it is difficult to tell and that we will continue to monitor her progress with pelvic exams and her symptoms. I explained that we may need to use a foley baloon after her cervix has dilated a little more. Pt understands and is happy with this plan.  Objective: BP (!) 140/52   Pulse 80   Temp 98.3 F (36.8 C) (Oral)   Resp 18   LMP 09/12/2018  or  Vitals:   05/29/19 1449 05/29/19 1604 05/29/19 1719 05/29/19 1808  BP: 137/85 (!) 143/88 128/78 (!) 140/52  Pulse: 82 88 81 80  Resp: 16 18 18 18   Temp: 98.3 F (36.8 C) 97.9 F (36.6 C)  98.3 F (36.8 C)  TempSrc: Oral Axillary  Oral    Dilation: Fingertip Effacement (%): Thick Cervical Position: Posterior Station: -3 Presentation: Vertex Exam by:: S moyer RN FHT: baseline rate 135, moderate varibility, acel present, decels none Toco: 2-5.5  Labs: Lab Results  Component Value Date   WBC 7.7 05/29/2019   HGB 10.9 (L) 05/29/2019   HCT 32.8 (L) 05/29/2019   MCV 85.2 05/29/2019   PLT 290 05/29/2019    Patient Active Problem List   Diagnosis Date Noted  . Gestational hypertension 05/29/2019  . Preeclampsia 05/21/2019  . Supervision of high risk pregnancy, antepartum 11/20/2018  . History of postpartum hemorrhage 11/20/2018  . H/O preeclampsia and PP HTN 11/20/2018  . Stress and adjustment reaction 09/28/2018  . Hematuria 09/18/2015  . Internal hemorrhoids with other complication 16/07/9603  .  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 Chiari malformation type I ([place]) 11/27/2013  . Common migraine 11/11/2013  . Unspecified constipation 08/16/2013  . Esophageal dysphagia 03/22/2013  . Vitamin D deficiency 03/06/2013  . Prediabetes 03/06/2013  . GERD (gastroesophageal reflux disease) 03/06/2013  . Morbid obesity ([place]) 07/07/2010  . SINUS ARRHYTHMIA 07/07/2010  . ANEMIA 06/23/2010    Assessment / Plan: 33 y.o. G2P1001 at [redacted]w[redacted]d here for IOL for Pre-eclampsia.  Labor: Pt has received X 3 Cytotec.Currently 0.5, thick, -3. Continue to observe, administer pitocin and foley baloon as and when neccessary.  Fetal Wellbeing:  Baby doing well. Baseline rate 135, variabilt Pain Control:  Happy to try without analgesia for now. Would like epidural if labour worsens.  Anticipated MOD:  Vaginal delivery   Lattie Haw, MD  PGY-1, [facility]  05/29/2019, 6:19 PM   I saw and evaluated the patient. I agree with the findings and the plan of care as documented in the resident's note. Will evaluate for Foley bulb placement at next check.   Barrington Ellison, MD [facility] Family Medicine Fellow, [facility] for [employer], [place]

## 2019-05-29 NOTE — H&P (Addendum)
OBSTETRIC ADMISSION HISTORY AND PHYSICAL  Janet [name] is a 33 y.o. female G2P1001 with IUP at [redacted]w[redacted]d by LMP and 7wk [place] presenting for IOL secondary to Pre-E. She reports +FMs, No LOF, no VB, no blurry vision, headaches or peripheral edema, and RUQ pain.  She plans on breast feeding. She requests POP for birth control. She received her prenatal care at [facility]   Dating: By 7 wk [place] and LMP --->  Estimated Date of Delivery: 06/19/19  Sono: 05/27/2019   [place] 32+6 wks,cephalic,fhr 712 bpm,anterior placenta gr 3,afi 12.5 cm,RI .54,.60,.54,.66=69%,BPP 8/8,EFW 3771 G 98%  Prenatal History/Complications: - hx of Pre-E with first pregnancy - hx of shoulder dystocia   Past Medical History: Past Medical History:  Diagnosis Date  . 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 Carpal tunnel syndrome during pregnancy   . Chiari malformation type I ([place]) 2015   on MRI  . 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 Cushing's syndrome ([place]) 11/27/2013  . Gastroesophageal reflux disease   . Hematuria 09/18/2015  . Menstrual periods irregular 09/18/2015  . Migraine without aura, with intractable migraine, so stated, without mention of status migrainosus 11/11/2013  . Patient desires pregnancy 11/27/2013  . Pregnant 12/16/2015  . Vitamin D deficiency     Past Surgical History: Past Surgical History:  Procedure Laterality Date  . CHOLECYSTECTOMY  02/2015  . COLONOSCOPY N/A 04/19/2013   WPY:KDXIPJ mucosa in the terminal ileum/Single erosion in transverse colon/RECTAL BLEEDING DUE TO Moderate sized internal hemorrhoids/ trv colon erosion, bx benign.  . ESOPHAGOGASTRODUODENOSCOPY N/A 04/19/2013   ASN:KNLZ Non-erosive gastritis/PERI-UMBILICAL PAIN DUE TO GERD, GASTRITIS, AND CONSTIPATION. Bx with mild chronic inactive gastritis. No H.Pylori  . HEMORRHOID BANDING  2015   Dr. Gala Romney- in office banding procedure  . TONSILLECTOMY    . 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 Marital status: Married    Spouse name: Not on file  . Number of children: 1  . Years of education: BA  . Highest education level: Not on file  Occupational History    Employer: [employer]  . Financial resource strain: Not on file  . Food insecurity    Worry: Not on file    Inability: Not on file  . Transportation needs    Medical: Not on file    Non-medical: Not on file  Tobacco Use  . Smoking status: Never Smoker  . Smokeless tobacco: Never Used  Substance and Sexual Activity  . Alcohol use: No  . Drug use: No  . Sexual activity: Yes    Birth control/protection: None  Lifestyle  . Physical activity    Days per week: Not on file    Minutes per session: Not on file  . Stress: Not on file  Relationships  . Social Herbalist on phone: Not on file    Gets together: Not on file  Attends religious service: Not on file    Active member of club or organization: Not on file    Attends meetings of clubs or organizations: Not on file    Relationship status: Not on file  Other Topics Concern  . Not on file  Social History Narrative   Denies caffeine use     Family History: Family History  Problem Relation Age of Onset  . Hypertension Mother   . Hyperlipidemia Mother   . Hypertension Father   . Hypertension Maternal Grandmother   . Diabetes Maternal Grandmother   . Diabetes Paternal Grandmother   . COPD Paternal Grandfather   . Heart disease Paternal Grandfather   . Colon cancer Other        maternal great uncle  . Stroke Maternal Aunt   . Migraines Maternal Aunt     Allergies: Allergies  Allergen Reactions  . Methylprednisolone Shortness Of Breath, Itching  and Other (See Comments)    Reaction:  Bruising   . Penicillins Anaphylaxis, Rash and Other (See Comments)    Has patient had a PCN reaction causing immediate rash, facial/tongue/throat swelling, SOB or lightheadedness with hypotension: No Has patient had a PCN reaction causing severe rash involving mucus membranes or skin necrosis: No Has patient had a PCN reaction that required hospitalization No Has patient had a PCN reaction occurring within the last 10 years: No If all of the above answers are "NO", then may proceed with Cephalosporin use.  . Latex Rash  . Neomycin Rash    Breaks out with neosporin    Medications Prior to Admission  Medication Sig Dispense Refill Last Dose  . prenatal vitamin w/FE, FA (PRENATAL 1 + 1) 27-1 MG TABS tablet Take 1 tablet by mouth daily at 12 noon. 30 each 12 05/29/2019 at Unknown time  . Ferrous Sulfate (IRON PO) Take 325 mg by mouth daily.     Marland Kitchen omeprazole (PRILOSEC) 20 MG capsule Take 20 mg by mouth as needed.         Review of Systems  All systems reviewed and negative except as stated in HPI  Blood pressure 132/89, pulse 86, temperature 98 F (36.7 C), temperature source Oral, resp. rate 18, last menstrual period 09/12/2018. General appearance: alert, cooperative, appears stated age and no distress Lungs: normal effort Heart: regular rate  Abdomen: soft, non-tender; bowel sounds normal Pelvic: gravid uterus  Extremities: Homans sign is negative, no sign of DVT Presentation: cephalic Fetal monitoringBaseline: 135 bpm, Variability: Good {> 6 bpm), Accelerations: Reactive and Decelerations: Absent Uterine activity: None Dilation: Closed Effacement (%): Thick Station: -3   Prenatal labs: ABO, Rh: --/--/B POS, B POS Performed at [facility], [place] [address], [place], [place] [address]  [phone]) Antibody: NEG (08/19 0815) Rubella: 8.78 (02/11 1631) RPR: Non Reactive (06/16 0845)  HBsAg: Negative (02/11 1631)  HIV: Non Reactive  (06/16 0845)  GBS:   Pos, Clinda Resistant  2 hr Glucola WNL Genetic screening  MaterniT21 normal Anatomy US WNL  Prenatal Transfer Tool  Maternal Diabetes: No Genetic Screening: Normal Maternal Ultrasounds/Referrals: Normal Fetal Ultrasounds or other Referrals:  None Maternal Substance Abuse:  No Significant Maternal Medications:  None Significant Maternal Lab Results: Group B Strep positive  Results for orders placed or performed during the hospital encounter of 05/29/19 (from the past 24 hour(s))  SARS Coronavirus 2 [facility] order, Performed in [facility] hospital lab) Nasopharyngeal Nasopharyngeal Swab   Collection Time: 05/29/19  7:33 AM   Specimen: Nasopharyngeal  Swab  Result Value Ref Range   SARS Coronavirus 2 NEGATIVE NEGATIVE  Type and screen   Collection Time: 05/29/19  8:15 AM  Result Value Ref Range   ABO/RH(D) B POS    Antibody Screen NEG    Sample Expiration      06/01/2019,2359 Performed at [facility], [place] [address], [place], [place] [address]   ABO/Rh   Collection Time: 05/29/19  8:15 AM  Result Value Ref Range   ABO/RH(D)      B POS Performed at [place] [address], [place], [place] [address]   CBC   Collection Time: 05/29/19  8:18 AM  Result Value Ref Range   WBC 7.7 4.0 - 10.5 K/uL   RBC 3.85 (L) 3.87 - 5.11 MIL/uL   Hemoglobin 10.9 (L) 12.0 - 15.0 g/dL   HCT 32.8 (L) 36.0 - 46.0 %   MCV 85.2 80.0 - 100.0 fL   MCH 28.3 26.0 - 34.0 pg   MCHC 33.2 30.0 - 36.0 g/dL   RDW 13.4 11.5 - 15.5 %   Platelets 290 150 - 400 K/uL   nRBC 0.0 0.0 - 0.2 %  Comprehensive metabolic panel   Collection Time: 05/29/19  8:18 AM  Result Value Ref Range   Sodium 138 135 - 145 mmol/L   Potassium 3.1 (L) 3.5 - 5.1 mmol/L   Chloride 108 98 - 111 mmol/L   CO2 20 (L) 22 - 32 mmol/L   Glucose, Bld 93 70 - 99 mg/dL   BUN 7 6 - 20 mg/dL   Creatinine, Ser 0.61 0.44 - 1.00 mg/dL   Calcium 8.8 (L) 8.9 - 10.3 mg/dL   Total Protein 6.0 (L) 6.5 -  8.1 g/dL   Albumin 2.5 (L) 3.5 - 5.0 g/dL   AST 27 15 - 41 U/L   ALT 17 0 - 44 U/L   Alkaline Phosphatase 116 38 - 126 U/L   Total Bilirubin 0.7 0.3 - 1.2 mg/dL   GFR calc non Af Amer >60 >60 mL/min   GFR calc Af Amer >60 >60 mL/min   Anion gap 10 5 - 15  Protein / creatinine ratio, urine   Collection Time: 05/29/19  8:18 AM  Result Value Ref Range   Creatinine, Urine 183.49 mg/dL   Total Protein, Urine 40 mg/dL   Protein Creatinine Ratio 0.22 (H) 0.00 - 0.15 mg/mg[Cre]    Patient Active Problem List   Diagnosis Date Noted  . Gestational hypertension 05/29/2019  . Preeclampsia 05/21/2019  . Supervision of high risk pregnancy, antepartum 11/20/2018  . History of postpartum hemorrhage 11/20/2018  . H/O preeclampsia and PP HTN 11/20/2018  . Stress and adjustment reaction 09/28/2018  . Hematuria 09/18/2015  . Internal hemorrhoids with other complication 40/07/2724  . 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 Chiari malformation type I ([place]) 11/27/2013  . Common migraine 11/11/2013  . Unspecified constipation 08/16/2013  . Esophageal dysphagia 03/22/2013  . Vitamin D deficiency 03/06/2013  . Prediabetes 03/06/2013  . GERD (gastroesophageal reflux disease) 03/06/2013  . Morbid obesity ([place]) 07/07/2010  . SINUS ARRHYTHMIA 07/07/2010  . ANEMIA 06/23/2010    Assessment/Plan:  Janet [name] is a 33 y.o. G2P1001 at [redacted]w[redacted]d here for IOL secondary to Pre-E without severe features. BP Readings from Last 4 Encounters:  05/29/19 132/89  05/27/19 (!) 138/92  05/24/19 (!) 134/92  05/21/19 (!) 151/99   #Pre-E without severe features - Pr/Cr 387 with mild range pressures  - Plt 290, LFT's  and Cr WNL - non severe range pressures and patient asymptomatic; last BP's as above  - cont to monitor for severe-range pressures  - BP check 1 week post-partum  - Will start Mg if becomes severe Pre-E #Labor: Initial SVE: closed/thick/-3. Cytotec placed at 0900. Serial BP's. #Pain: Possible  epidural later #FWB: Cat I; EFW: 3900g #ID:  GBS pos and Clinda neg; Vanc started #MOF: Breast #MOC: POP  Barrington Ellison, MD OB Family Medicine Fellow, [facility] for [facility], [facility] 05/29/2019, 12:55 PM

## 2019-05-30 MED ORDER — OXYTOCIN 40 UNITS IN NORMAL SALINE INFUSION - SIMPLE MED
1.0000 m[IU]/min | INTRAVENOUS | Status: DC
  Administered 2019-05-30: 14 m[IU]/min via INTRAVENOUS
  Administered 2019-05-30: 12 m[IU]/min via INTRAVENOUS
  Administered 2019-05-30: 18 m[IU]/min via INTRAVENOUS
  Administered 2019-05-30: 10 m[IU]/min via INTRAVENOUS
  Administered 2019-05-30: 8 m[IU]/min via INTRAVENOUS
  Administered 2019-05-30: 2 m[IU]/min via INTRAVENOUS
  Administered 2019-05-30: 16 m[IU]/min via INTRAVENOUS
  Administered 2019-05-31: 30 m[IU]/min via INTRAVENOUS
  Administered 2019-05-31: 20 m[IU]/min via INTRAVENOUS
  Administered 2019-05-31: 2 m[IU]/min via INTRAVENOUS
  Administered 2019-05-31: 34 m[IU]/min via INTRAVENOUS
  Administered 2019-05-31: 28 m[IU]/min via INTRAVENOUS
  Administered 2019-05-31: 22 m[IU]/min via INTRAVENOUS
  Administered 2019-05-31: 24 m[IU]/min via INTRAVENOUS
  Administered 2019-05-31: 26 m[IU]/min via INTRAVENOUS
  Administered 2019-05-31: 32 m[IU]/min via INTRAVENOUS
  Filled 2019-05-30: qty 1000

## 2019-05-30 MED ORDER — TERBUTALINE SULFATE 1 MG/ML IJ SOLN: 0.2500 mg | Freq: Once | INTRAMUSCULAR | Status: DC | PRN

## 2019-05-30 NOTE — Progress Notes (Signed)
LABOR PROGRESS NOTE Janet [name] is a 33 y.o. G2P1001 at [redacted]w[redacted]d  admitted for IOL secondary to Pre-E.   Subjective: Patient feels like her contractions have picked up in intensity. She is feeling baby move.   Objective:   Dilation: 4 Effacement (%): 60-70 Cervical Position: Posterior, ballotable Station: -3 Presentation: Vertex Exam by: Elaina Cara FHT: baseline rate 135, moderate varibility, accels, no decels Toco: CTX q3-4 min   Assessment / Plan: Janet [name] is a 33 y.o. G2P1001 at [redacted]w[redacted]d  admitted for IOL secondary to Pre-E.   Labor: s/p cytotecx3, s/p foley bulb, on pitocin (2094), continue time-appropriate cervical exams. Consider AROM   Fetal Wellbeing:  Fetal status reassuring.  -- continuous fetal monitoring    Pain Control:  Desires epidural when contractions become painful   Anticipated MOD:  Anticipate vaginal delivery, C/S as appropriate.   Mathis Dad, D.O. OB Fellow

## 2019-05-30 NOTE — Progress Notes (Addendum)
Janet Allenis a 33 y.o.G2P1001 at [redacted]w[redacted]d admitted for IOL secondary to Pre-E.  Subjective: Vomited after eating meal while off pitocin. Pitocin has just restarted is feeling contractions as strong as before but more frequent. Tolerating them well.   Objective: BP (!) 148/98   Pulse 100   Temp 98.6 F (37 C) (Oral)   Resp 18   LMP 09/12/2018   SpO2 98%  No intake/output data recorded. No intake/output data recorded.  FHT:  FHR: 135 bpm, variability: moderate,  accelerations:  Present,  decelerations:  Absent UC:   regular, every 5 minutes SVE:   Dilation: 4 Effacement (%): 70 Station: -3 Exam by:: Aron Baba RN  Labs: Lab Results  Component Value Date   WBC 7.7 05/29/2019   HGB 10.9 (L) 05/29/2019   HCT 32.8 (L) 05/29/2019   MCV 85.2 05/29/2019   PLT 290 05/29/2019    Assessment / Plan: Janet Allenis a 33 y.o.G2P1001 at [redacted]w[redacted]d admitted for IOL secondary to Pre-E.  Labor: s/p cytotec X 3, s/p foley bulb, on pitocin (7253), break between 17:45 and 18:45. Pitocin restarted at 18:45. Continue on pitocin for now . Consider AROM. Preeclampsia:  no signs or symptoms of toxicity Fetal Wellbeing: fetal status reassuring. Category I Pain Control:  Declined analgesia at present. Would like epidural when in active labor. I/D:  n/a Anticipated MOD:  Anticipate vaginal delivery, c/s as appropriate.   Lattie Haw MD PGY-1 [place]  05/30/2019, 7:27 PM   GME ATTESTATION:  I saw and evaluated the patient. I agree with the findings and the plan of care as documented in the resident's note.  Merilyn Baba, DO OB Fellow [place] for [employer]

## 2019-05-30 NOTE — Progress Notes (Addendum)
  Had a HA (back of head) that resolved w/tylenol. Ctx very mild.  Attempted AROM a little while ago, vtx floating.  FHR Cat 1, Pitocin at 12 mu/min.  Will continue to ^ Pitocin and AROM when vtx more applied to cx. MgSO4 if another severe range BP.   Patient Vitals for the past 4 hrs:  BP Temp Temp src Pulse Resp  05/30/19 2040 (!) 153/97 - - 88 15  05/30/19 2032 (!) 152/110 - - 93 15  05/30/19 2010 (!) 153/84 - - 80 15  05/30/19 2000 (!) 148/99 - - 97 16  05/30/19 1931 (!) 145/94 - - 88 17  05/30/19 1900 (!) 148/98 - - 100 18  05/30/19 1830 137/87 - - 95 18  05/30/19 1800 120/77 98.6 F (37 C) Oral 78 18

## 2019-05-30 NOTE — Progress Notes (Signed)
Dr. Darene Lamer states no change from last cervical check.

## 2019-05-30 NOTE — Progress Notes (Addendum)
Janet [name] Janet [name] a 33 y.o.femaleG2P1001 with IUP at [redacted]w[redacted]d by LMP and 7wk USpresenting for IOL secondary to Pre-E. She reports +FMs, No LOF, no VB, no blurry vision, headaches or peripheral edema, and RUQ pain. She plans on breastfeeding. She requests POPfor birth control. [facility]  Subjective: Pt doing well. Having contractions every 7-8 mins.   Objective: BP 129/83   Pulse 76   Temp 98 F (36.7 C) (Oral)   Resp 18   LMP 09/12/2018   SpO2 98%  No intake/output data recorded. No intake/output data recorded.  FHT:  FHR: 135 bpm, variability: moderate,  accelerations:  Present,  decelerations:  Absent UC:   regular, every 7-8 minutes SVE:   Dilation: 4 Effacement (%): 70 Station: -3 Exam by:: S moyer RN  Labs: Lab Results  Component Value Date   WBC 7.7 05/29/2019   HGB 10.9 ([name]) 05/29/2019   HCT 32.8 ([name]) 05/29/2019   MCV 85.2 05/29/2019   PLT 290 05/29/2019    Assessment / Plan: Foley catheter removed at 08:15. Has had total 3 X Cytotec.  1) Continue Pitocin and increase as needed 2) Continue appropriate cervical checks 3) Consider AROM if not progressing   Labor: Progressing normally Preeclampsia:  no signs or symptoms of toxicity Fetal Wellbeing:  Category I Pain Control:  Labor support without medications Plans for epidural during active stage of labor.  I/D:  GBS+ve Anticipated MOD:  NSVD  [name] 05/30/2019, 10:42 AM   GME ATTESTATION:  I saw and evaluated the patient. I agree with the findings and the plan of care as documented in the resident's note.  Merilyn Baba, DO OB Fellow [place] for [employer]

## 2019-05-30 NOTE — Progress Notes (Signed)
Patient ID: Janet [name], female   DOB: [birth year], 33 y.o.   MRN: [id number] Late Entry, EPic Down time  Foley inserted Cervix 1-2/70-80%/-2 at the time Cervix softer  FHR reactive UCs irregular

## 2019-05-31 ENCOUNTER — Inpatient Hospital Stay (HOSPITAL_COMMUNITY): Payer: 59 | Admitting: Anesthesiology

## 2019-05-31 LAB — CBC
HCT: 33.6 % — ABNORMAL LOW (ref 36.0–46.0)
Hemoglobin: 11.2 g/dL — ABNORMAL LOW (ref 12.0–15.0)
MCH: 28.6 pg (ref 26.0–34.0)
MCHC: 33.3 g/dL (ref 30.0–36.0)
MCV: 85.9 fL (ref 80.0–100.0)
Platelets: 275 10*3/uL (ref 150–400)
RBC: 3.91 MIL/uL (ref 3.87–5.11)
RDW: 13.7 % (ref 11.5–15.5)
WBC: 7.8 10*3/uL (ref 4.0–10.5)
nRBC: 0 % (ref 0.0–0.2)

## 2019-05-31 MED ORDER — FENTANYL-BUPIVACAINE-NACL 0.5-0.125-0.9 MG/250ML-% EP SOLN
12.0000 mL/h | EPIDURAL | Status: DC | PRN
  Administered 2019-05-31: 12 mL/h via EPIDURAL
  Filled 2019-05-31: qty 250

## 2019-05-31 MED ORDER — LACTATED RINGERS IV SOLN
500.0000 mL | Freq: Once | INTRAVENOUS | Status: AC
  Administered 2019-05-31: 500 mL via INTRAVENOUS

## 2019-05-31 MED ORDER — LIDOCAINE HCL (PF) 1 % IJ SOLN
INTRAMUSCULAR | Status: DC | PRN
  Administered 2019-05-31 (×2): 5 mL

## 2019-05-31 MED ORDER — EPHEDRINE 5 MG/ML INJ
10.0000 mg | INTRAVENOUS | Status: DC | PRN
  Filled 2019-05-31: qty 10

## 2019-05-31 MED ORDER — PHENYLEPHRINE 40 MCG/ML (10ML) SYRINGE FOR IV PUSH (FOR BLOOD PRESSURE SUPPORT)
80.0000 ug | PREFILLED_SYRINGE | INTRAVENOUS | Status: DC | PRN
  Filled 2019-05-31: qty 10

## 2019-05-31 MED ORDER — DIPHENHYDRAMINE HCL 50 MG/ML IJ SOLN: 12.5000 mg | INTRAMUSCULAR | Status: DC | PRN

## 2019-05-31 MED ORDER — PHENYLEPHRINE 40 MCG/ML (10ML) SYRINGE FOR IV PUSH (FOR BLOOD PRESSURE SUPPORT): 80.0000 ug | PREFILLED_SYRINGE | INTRAVENOUS | Status: DC | PRN

## 2019-05-31 MED ORDER — EPHEDRINE 5 MG/ML INJ: 10.0000 mg | INTRAVENOUS | Status: DC | PRN

## 2019-05-31 NOTE — Progress Notes (Signed)
Janet [name] is a 33 y.o. G2P1001 at [redacted]w[redacted]d  admitted for IOL for PreE w/o severe features.  Subjective: Resting in bedside chair. Denies contractions. Mild headache that has been mostly relieved with Tylenol.   Objective: BP 135/83   Pulse 77   Temp 98.9 F (37.2 C) (Oral)   Resp 15   Ht 5\' 4"  (1.626 m)   Wt 111.1 kg   LMP 09/12/2018   SpO2 98%   BMI 42.05 kg/m  No intake/output data recorded. No intake/output data recorded.  FHT:  FHR: 135 bpm, variability: moderate,  accelerations:  Present,  decelerations:  Absent UC:  Pit at 9 SVE:   Dilation: 4 Effacement (%): 50, 60 Station: Ballotable Exam by:: Jennefer Bravo, MD  Labs: Lab Results  Component Value Date   WBC 7.7 05/29/2019   HGB 10.9 (L) 05/29/2019   HCT 32.8 (L) 05/29/2019   MCV 85.2 05/29/2019   PLT 290 05/29/2019    Assessment / Plan: Janet [name] is a 33 y.o. G2P1001 at [redacted]w[redacted]d  admitted for IOL for PreE w/o severe features.  Labor: s/p cyotec X 4, FB and pit. Unsuccessful attempt to rupture membranes yesterday and through the night. Pit break between 5am-9am today. Restarted pit at 1128 today. Will likely give 12 hours of Pit after break to see if able to start labor. Patient stable from Pre-E perspective with Cat I tracing. AROM when able; still did not feel comfortable at this check to AROM due to posterior cervix position and ballotable baby. Preeclampsia:  no signs or symptoms of toxicity Fetal Wellbeing:  Category I fetal HR reassuring Pain Control:  Labor support without medications will have epidural when in active stage of labor I/D:  on vancomycin for GBS ppx Anticipated MOD:  Vaginal delivery anticipated, AROM again and c-section if neccessary   Cedra Villalon N Cecylia Brazill 05/31/2019, 1:19 PM

## 2019-05-31 NOTE — Progress Notes (Signed)
Janet [name] is a 33 y.o. G2P1001 at [redacted]w[redacted]d  admitted for IOL for PreE w/o severe features.  Subjective: Resting in bedside chair. Denies contractions and overall feeling comfortable.   Objective: BP 126/79   Pulse 81   Temp 98.4 F (36.9 C) (Oral)   Resp 15   Ht 5\' 4"  (1.626 m)   Wt 111.1 kg   LMP 09/12/2018   SpO2 98%   BMI 42.05 kg/m  No intake/output data recorded. No intake/output data recorded.  FHT:  FHR: 140 bpm, variability: moderate,  accelerations:  Present,  decelerations:  Absent UC:  Pit at 9 SVE:   Dilation: 4 Effacement (%): 50, 60 Station: -3 Exam by:: dr Elonda Husky  Labs: Lab Results  Component Value Date   WBC 7.7 05/29/2019   HGB 10.9 (L) 05/29/2019   HCT 32.8 (L) 05/29/2019   MCV 85.2 05/29/2019   PLT 290 05/29/2019    Assessment / Plan: Janet [name] is a 33 y.o. G2P1001 at [redacted]w[redacted]d  admitted for IOL for PreE w/o severe features.  Labor: s/p cyotec X 4, FB and pit. AROM by Dr. Elonda Husky at this check. Cont Pit.  Preeclampsia:  no signs or symptoms of toxicity Fetal Wellbeing:  Category I  Pain Control:  Labor support without medications will have epidural when desires I/D:  on vancomycin for GBS ppx Anticipated MOD:  Vaginal delivery anticipated, AROM again and c-section if neccessary   Chauncey Mann 05/31/2019, 5:55 PM

## 2019-05-31 NOTE — Progress Notes (Signed)
LABOR PROGRESS NOTE  Janet [name] is a 33 y.o. G2P1001 at [redacted]w[redacted]d admitted for IOL for PreE w/o SF  Subjective: Not feeling contractions very strongly Had headache earlier, much improved with tylenol  Objective: BP 131/64   Pulse 81   Temp 97.9 F (36.6 C) (Oral)   Resp 15   Ht _0  (1.626 m)   Wt 111.1 kg   LMP 09/12/2018   SpO2 98%   BMI 42.05 kg/m  or  Vitals:   05/30/19 2238 05/30/19 2301 05/30/19 2331 05/31/19 0010  BP:  138/87 130/87 131/64  Pulse:  76 75 81  Resp:  _1 Temp: 97.9 F (36.6 C)     TempSrc: Oral     SpO2:      Weight:    111.1 kg  Height:    _2  (1.626 m)     Dilation: 4 Effacement (%): 70 Cervical Position: Posterior Station: Ballotable Presentation: Vertex Exam by:: Dr. EDione PloverFHT: baseline rate 130, moderate varibility, +acel, -decel Toco: ctx q4-6 min  Labs: Lab Results  Component Value Date   WBC 7.7 05/29/2019   HGB 10.9 (L) 05/29/2019   HCT 32.8 (L) 05/29/2019   MCV 85.2 05/29/2019   PLT 290 05/29/2019    Patient Active Problem List   Diagnosis Date Noted  . Gestational hypertension 05/29/2019  . Preeclampsia 05/21/2019  . Supervision of high risk pregnancy, antepartum 11/20/2018  . History of postpartum hemorrhage 11/20/2018  . H/O preeclampsia and PP HTN 11/20/2018  . Stress and adjustment reaction 09/28/2018  . Hematuria 09/18/2015  . Internal hemorrhoids with other complication 082/95/6213 . [place]cortisolism ([place] 11/27/2013  . Chiari malformation type I ([place] 11/27/2013  . Common migraine 11/11/2013  . Unspecified constipation 08/16/2013  . Esophageal dysphagia 03/22/2013  . Vitamin D deficiency 03/06/2013  . Prediabetes 03/06/2013  . GERD (gastroesophageal reflux disease) 03/06/2013  . Morbid [place] ([place] 07/07/2010  . SINUS ARRHYTHMIA 07/07/2010  . ANEMIA 06/23/2010    Assessment / Plan: 33y.o. G2P1001 at 311w2dere for IOL for pre-e w/o SF.  Labor: s/p miso x4, FB, on pit. Slow progress  and unable to rupture due to lack of fetal engagement. Cont to uptitrate pitocin until adequate contraction pattern is obtained and reassess at appropriate intervals for AROM.  Fetal Wellbeing:  Cat I Pain Control:  Open to epidural Anticipated MOD:  SVD PreE w/o SF: Single severe range this evening, susequently mild to normal range. HA not persistent. No criteria met for SF at this point, ctm BP and symptoms closely.   MaAugustin CoupeMD/MPH OB Fellow  05/31/2019, 12:32 AM

## 2019-05-31 NOTE — Anesthesia Preprocedure Evaluation (Signed)
Anesthesia Evaluation  Patient identified by MRN, date of birth, ID band Patient awake    Reviewed: Allergy & Precautions, H&P , NPO status , Patient's Chart, lab work & pertinent test results  History of Anesthesia Complications Negative for: history of anesthetic complications  Airway Mallampati: II  TM Distance: >3 FB Neck ROM: full    Dental no notable dental hx.    Pulmonary neg pulmonary ROS,    Pulmonary exam normal        Cardiovascular hypertension, Normal cardiovascular exam Rhythm:regular Rate:Normal     Neuro/Psych Chiari Malformation type I negative psych ROS   GI/Hepatic Neg liver ROS, GERD  ,  Endo/Other  Morbid obesity  Renal/GU negative Renal ROS  negative genitourinary   Musculoskeletal   Abdominal   Peds  Hematology negative hematology ROS (+)   Anesthesia Other Findings Pre-eclampsia  Reproductive/Obstetrics (+) Pregnancy                             Anesthesia Physical Anesthesia Plan  ASA: III  Anesthesia Plan: Epidural   Post-op Pain Management:    Induction:   PONV Risk Score and Plan:   Airway Management Planned:   Additional Equipment:   Intra-op Plan:   Post-operative Plan:   Informed Consent: I have reviewed the patients History and Physical, chart, labs and discussed the procedure including the risks, benefits and alternatives for the proposed anesthesia with the patient or authorized representative who has indicated his/her understanding and acceptance.       Plan Discussed with:   Anesthesia Plan Comments: (Discussed epidural analgesia with Chiari malformation and the increased risk of exacerbation of neurologic symptoms with inadvertent dural puncture. Advised her to keep [place] aware of any severe headaches, especially if positional, or neurologic changes. She agrees and wishes to proceed.)        Anesthesia Quick Evaluation

## 2019-05-31 NOTE — Progress Notes (Signed)
Patient Vitals for the past 4 hrs:  BP Temp Temp src Pulse Resp Height Weight  05/31/19 0240 113/67 - - 72 15 - -  05/31/19 0223 - 98.3 F (36.8 C) Oral - - - -  05/31/19 0201 130/80 - - 70 15 - -  05/31/19 0031 136/82 - - 72 15 - -  05/31/19 0010 131/64 - - 81 15 5\' 4"  (1.626 m) 111.1 kg  05/30/19 2331 130/87 - - 75 15 - -  05/30/19 2301 138/87 - - 76 15 - -   Ptiocin at 28 mu/min, pt still not hurting.  Ctx q 3-5 minutes. Vtx floats away during cx exams. FHR Cat 1.  Will continue w/PItocin up to 76mu/min max.

## 2019-05-31 NOTE — Progress Notes (Addendum)
Janet [name] is a 33 y.o. G2P1001 at [redacted]w[redacted]d  admitted for IOL for PreE w/o SF  Subjective: Laying in bed, speaking to her other daughter on the phone. Not feeling contraction.  Objective: BP 129/78   Pulse 74   Temp 98.9 F (37.2 C) (Oral)   Resp 15   Ht 5\' 4"  (1.626 m)   Wt 111.1 kg   LMP 09/12/2018   SpO2 98%   BMI 42.05 kg/m  No intake/output data recorded. No intake/output data recorded.  FHT:  FHR: 132 bpm, variability: moderate,  accelerations:  Present,  decelerations:  Absent UC:  0, pitocin just restarted SVE:   Dilation: 4 Effacement (%): 70 Station: Ballotable Exam by:: Dr. Dione Plover  Labs: Lab Results  Component Value Date   WBC 7.7 05/29/2019   HGB 10.9 (L) 05/29/2019   HCT 32.8 (L) 05/29/2019   MCV 85.2 05/29/2019   PLT 290 05/29/2019    Assessment / Plan: Janet [name] is a 33 y.o. G2P1001 at [redacted]w[redacted]d  admitted for IOL for PreE w/o SF  Labor: s/p cyotec X 4, FB and pit. Unsuccessful attempt to rupture membranes yesterday and through the night. Pit break between 5am-9am today. Restarted pit however was leaking. Only just fixed IV. Continue to monitor contractions with Pit. Preeclampsia:  no signs or symptoms of toxicity Fetal Wellbeing:  Category I fetal HR reassuring Pain Control:  Labor support without medications will have epidural when in active stage of labor I/D:  on vancomycin  Anticipated MOD:  Vaginal delivery anticipated, AROM again and c-section if neccessary   [name] 05/31/2019, 12:19 PM  I saw and evaluated the patient. I agree with the findings and the plan of care as documented in the resident's note. See last progress note by myself for further details on plan.  Barrington Ellison, MD [facility] Family Medicine Fellow, [facility] for [employer], [place]

## 2019-05-31 NOTE — Progress Notes (Signed)
Faculty Note  S: Patient feeling well, starting to feel contractions again.   O: BP 139/83   Pulse 85   Temp 98.9 F (37.2 C) (Oral)   Resp 15   Ht 5\' 4"  (1.626 m)   Wt 111.1 kg   LMP 09/12/2018   SpO2 98%   BMI 42.05 kg/m   Gen: alert, oriented SVE: 5/70/-3  FHT: 155 bpm, moderate variability, accels  present, no decels Toco: ctx occasionally   A/P: Pt is 33 y.o. G2P1001 @ [redacted]w[redacted]d who is admitted for induction of labor for pre-elcampsia without severe features. She has had a prolonged induction course, s/p 4 cytotec, foley balloon. Was started on pitocin yesterday (8/20) am around 5 and has been on since with a short pit break yesterday pm and a longer pit break (5-9am) this morning. She is now starting to feel contractions again, head has been ballotable. On this exam, cervix remains quite posterior however, I do feel the head is lower. Not quite safe for AROM yet but will plan for AROM with next exam. Patient verbalizes understanding and glad for some progress, states her labor progressed very quickly with AROM after her first baby. Will recheck 4-6 hours. Cont pitocin.   Feliz Beam, M.D. Attending Center for [employer] farm manager)

## 2019-05-31 NOTE — Progress Notes (Signed)
Labor Progress Note Janet [name] is a 33 y.o. G2P1001 at [redacted]w[redacted]d presented for IOL for Preeclampsia.  S: Pain well controlled currently with epidural.  No new complaints.  O:  BP (!) 147/87   Pulse 79   Temp 98.6 F (37 C) (Tympanic)   Resp 16   Ht 5\' 4"  (1.626 m)   Wt 111.1 kg   LMP 09/12/2018   SpO2 98%   BMI 42.05 kg/m  EFM: 125/moderate var/accels, no decels   CVE: Dilation: 5.5 Effacement (%): 70 Cervical Position: Posterior Station: Ballotable Presentation: Vertex Exam by:: Marylou Mccoy, RN   A&P: 33 y.o. G2P1001 [redacted]w[redacted]d IOL for preeclampsia.  #Labor: Progressing slowly.  S/p cytotec x4, pit s/p pit break, AROM. Continue Pit. #Pain: Epidural in place #FWB: category I #GBS: positive on Vanc #Pre-e: Bps 140s/80s. No red flags. Will continue to monitor.   Matilde Haymaker, MD 10:00 PM

## 2019-05-31 NOTE — Anesthesia Procedure Notes (Signed)
Epidural Patient location during procedure: OB Start time: 05/31/2019 7:29 PM End time: 05/31/2019 7:43 PM  Staffing Anesthesiologist: Lidia Collum, MD Performed: anesthesiologist   Preanesthetic Checklist Completed: patient identified, pre-op evaluation, timeout performed, IV checked, risks and benefits discussed and monitors and equipment checked  Epidural Patient position: sitting Prep: DuraPrep Patient monitoring: heart rate, continuous pulse ox and blood pressure Approach: midline Location: L3-L4 Injection technique: LOR air  Needle:  Needle type: Tuohy  Needle gauge: 17 G Needle length: 9 cm Needle insertion depth: 6.5 cm Catheter type: closed end flexible Catheter size: 19 Gauge Catheter at skin depth: 12 cm Test dose: negative  Assessment Events: blood not aspirated, injection not painful, no injection resistance, negative IV test and no paresthesia  Additional Notes Reason for block:procedure for pain

## 2019-05-31 NOTE — Progress Notes (Signed)
LABOR PROGRESS NOTE  Janet [name] is a 33 y.o. G2P1001 at [redacted]w[redacted]d admitted for IOL for PreE w/o SF  Subjective: Sleeping in bed Reports she is not feeling contractions  Objective: BP (!) 141/87   Pulse 73   Temp 98.3 F (36.8 C) (Oral)   Resp 14   Ht 5' 4"  (1.626 m)   Wt 111.1 kg   LMP 09/12/2018   SpO2 98%   BMI 42.05 kg/m  or  Vitals:   05/31/19 0331 05/31/19 0401 05/31/19 0431 05/31/19 0448  BP: 126/82 133/76 130/82 (!) 141/87  Pulse: 65 72 71 73  Resp: 16 16 15 14   Temp:      TempSrc:      SpO2:      Weight:      Height:         Dilation: 4 Effacement (%): 70 Cervical Position: Posterior Station: Ballotable Presentation: Vertex Exam by:: Dr. EDione PloverFHT: baseline rate 130, moderate varibility, +acel, -decel Toco: ctx q4-6 min  Labs: Lab Results  Component Value Date   WBC 7.7 05/29/2019   HGB 10.9 (L) 05/29/2019   HCT 32.8 (L) 05/29/2019   MCV 85.2 05/29/2019   PLT 290 05/29/2019    Patient Active Problem List   Diagnosis Date Noted  . Gestational hypertension 05/29/2019  . Preeclampsia 05/21/2019  . Supervision of high risk pregnancy, antepartum 11/20/2018  . History of postpartum hemorrhage 11/20/2018  . H/O preeclampsia and PP HTN 11/20/2018  . Stress and adjustment reaction 09/28/2018  . Hematuria 09/18/2015  . Internal hemorrhoids with other complication 006/89/3406 . 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 Chi[place] malformation type I ([place] 11/27/2013  . Common migraine 11/11/2013  . Unspecified constipation 08/16/2013  . Esophageal dysphagia 03/22/2013  . Vitamin D deficiency 03/06/2013  . Prediabetes 03/06/2013  . GERD (gastroesophageal reflux disease) 03/06/2013  . Morbid obesity ([place] 07/07/2010  . SINUS ARRHYTHMIA 07/07/2010  . ANEMIA 06/23/2010    Assessment / Plan: 33y.o. G2P1001 at 335w2dere for IOL for pre-e w/o SF.  Labor: s/p miso x4, FB, on pit. Have been unable to rupture thoughout the night due to high station with  ballotable fetus. Discussed w Dr. CoElly Modenawill give longer pit break and attempt to restart in AM, gtt stopped at 0516. Fetal Wellbeing:  Cat I Pain Control:  Open to epidural Anticipated MOD:  SVD PreE w/o SF: Single severe range this evening, susequently mild to normal range. HA not persistent. No criteria met for SF at this point, ctm BP and symptoms closely.   MaAugustin CoupeMD/MPH OB Fellow  05/31/2019, 5:32 AM

## 2019-06-01 ENCOUNTER — Encounter (HOSPITAL_COMMUNITY): Payer: Self-pay

## 2019-06-01 DIAGNOSIS — O99824 Streptococcus B carrier state complicating childbirth: Secondary | ICD-10-CM

## 2019-06-01 DIAGNOSIS — O1404 Mild to moderate pre-eclampsia, complicating childbirth: Secondary | ICD-10-CM

## 2019-06-01 DIAGNOSIS — B951 Streptococcus, group B, as the cause of diseases classified elsewhere: Secondary | ICD-10-CM | POA: Diagnosis present

## 2019-06-01 DIAGNOSIS — Z3A37 37 weeks gestation of pregnancy: Secondary | ICD-10-CM

## 2019-06-01 LAB — CBC
HCT: 29.2 % — ABNORMAL LOW (ref 36.0–46.0)
Hemoglobin: 9.7 g/dL — ABNORMAL LOW (ref 12.0–15.0)
MCH: 28.5 pg (ref 26.0–34.0)
MCHC: 33.2 g/dL (ref 30.0–36.0)
MCV: 85.9 fL (ref 80.0–100.0)
Platelets: 260 10*3/uL (ref 150–400)
RBC: 3.4 MIL/uL — ABNORMAL LOW (ref 3.87–5.11)
RDW: 13.5 % (ref 11.5–15.5)
WBC: 15.6 10*3/uL — ABNORMAL HIGH (ref 4.0–10.5)
nRBC: 0 % (ref 0.0–0.2)

## 2019-06-01 LAB — PLATELET COUNT: Platelets: 260 10*3/uL (ref 150–400)

## 2019-06-01 MED ORDER — DIBUCAINE (PERIANAL) 1 % EX OINT: 1.0000 "application " | TOPICAL_OINTMENT | CUTANEOUS | Status: DC | PRN

## 2019-06-01 MED ORDER — ACETAMINOPHEN 325 MG PO TABS
650.0000 mg | ORAL_TABLET | ORAL | Status: DC | PRN
  Administered 2019-06-01: 650 mg via ORAL
  Filled 2019-06-01: qty 2

## 2019-06-01 MED ORDER — TRANEXAMIC ACID-NACL 1000-0.7 MG/100ML-% IV SOLN: 1000.0000 mg | Freq: Once | INTRAVENOUS | Status: DC | PRN

## 2019-06-01 MED ORDER — SENNOSIDES-DOCUSATE SODIUM 8.6-50 MG PO TABS
2.0000 | ORAL_TABLET | ORAL | Status: DC
  Administered 2019-06-02: 2 via ORAL
  Filled 2019-06-01: qty 2

## 2019-06-01 MED ORDER — ONDANSETRON HCL 4 MG PO TABS: 4.0000 mg | ORAL_TABLET | ORAL | Status: DC | PRN

## 2019-06-01 MED ORDER — WITCH HAZEL-GLYCERIN EX PADS: 1.0000 "application " | MEDICATED_PAD | CUTANEOUS | Status: DC | PRN

## 2019-06-01 MED ORDER — COCONUT OIL OIL
1.0000 "application " | TOPICAL_OIL | Status: DC | PRN
  Administered 2019-06-01: 1 via TOPICAL

## 2019-06-01 MED ORDER — TETANUS-DIPHTH-ACELL PERTUSSIS 5-2.5-18.5 LF-MCG/0.5 IM SUSP: 0.5000 mL | Freq: Once | INTRAMUSCULAR | Status: DC

## 2019-06-01 MED ORDER — BENZOCAINE-MENTHOL 20-0.5 % EX AERO
1.0000 "application " | INHALATION_SPRAY | CUTANEOUS | Status: DC | PRN
  Filled 2019-06-01: qty 56

## 2019-06-01 MED ORDER — PRENATAL MULTIVITAMIN CH
1.0000 | ORAL_TABLET | Freq: Every day | ORAL | Status: DC
  Administered 2019-06-01: 1 via ORAL
  Filled 2019-06-01 (×2): qty 1

## 2019-06-01 MED ORDER — ZOLPIDEM TARTRATE 5 MG PO TABS: 5.0000 mg | ORAL_TABLET | Freq: Every evening | ORAL | Status: DC | PRN

## 2019-06-01 MED ORDER — DIPHENHYDRAMINE HCL 25 MG PO CAPS: 25.0000 mg | ORAL_CAPSULE | Freq: Four times a day (QID) | ORAL | Status: DC | PRN

## 2019-06-01 MED ORDER — SIMETHICONE 80 MG PO CHEW: 80.0000 mg | CHEWABLE_TABLET | ORAL | Status: DC | PRN

## 2019-06-01 MED ORDER — TRANEXAMIC ACID-NACL 1000-0.7 MG/100ML-% IV SOLN
1000.0000 mg | INTRAVENOUS | Status: AC
  Administered 2019-06-01: 1000 mg via INTRAVENOUS

## 2019-06-01 MED ORDER — ONDANSETRON HCL 4 MG/2ML IJ SOLN: 4.0000 mg | INTRAMUSCULAR | Status: DC | PRN

## 2019-06-01 MED ORDER — TRANEXAMIC ACID-NACL 1000-0.7 MG/100ML-% IV SOLN
INTRAVENOUS | Status: AC
  Filled 2019-06-01: qty 100

## 2019-06-01 MED ORDER — IBUPROFEN 600 MG PO TABS
600.0000 mg | ORAL_TABLET | Freq: Four times a day (QID) | ORAL | Status: DC
  Administered 2019-06-01 – 2019-06-02 (×4): 600 mg via ORAL
  Filled 2019-06-01 (×6): qty 1

## 2019-06-01 NOTE — Anesthesia Postprocedure Evaluation (Signed)
Anesthesia Post Note  Patient: Janet [name]  Procedure(s) Performed: [email or web address]     Patient location during evaluation: Mother Baby Anesthesia Type: Epidural Level of consciousness: awake and alert Pain management: pain level controlled Vital Signs Assessment: post-procedure vital signs reviewed and stable Respiratory status: spontaneous breathing Cardiovascular status: blood pressure returned to baseline Postop Assessment: no headache, adequate PO intake, no backache, able to ambulate, epidural receding, no apparent nausea or vomiting and patient able to bend at knees Anesthetic complications: no    Last Vitals:  Vitals:   06/01/19 0355 06/01/19 0500  BP: (!) 142/78 119/79  Pulse: 88 76  Resp: 18 18  Temp: 36.9 C 37.6 C  SpO2: 100% 99%    Last Pain:  Vitals:   06/01/19 0700  TempSrc:   PainSc: 0-No pain   Pain Goal:                   [name]

## 2019-06-01 NOTE — Lactation Note (Signed)
This note was copied from a baby's chart. Lactation Consultation Note  Patient Name: Janet [name] [id number] Date: 06/01/2019 Reason for consult: Initial assessment;Early term 19-38.6wks  P2 mother whose infant is now 44 hours old.  This is an ETI at 37+3 weeks.  Mother breast fed her first child (now 33 years old) for one year.  Mother's breasts are large, soft and non tender and nipples are everted, short shafted and intact.  Breast tissue is compressible.  Mother had just attempted to latch baby to breast, however, she remained sleepy at this time.  Mother stated baby fed well right after birth.  Reassured mother that this is typical behavior at this age.    Discussed the ETI and feeding cues.  Mother is familiar with hand expression and is able to express a couple drops of colostrum from the breast.  Colostrum container provided and milk storage times reviewed.  Finger feeding demonstrated.  Mother will feed back any EBM she obtains to baby.  At this time, pumping is not necessary but mentioned this option to mother if baby needs supplementation and/or does not begin to breast feed by later tonight.  Mother verbalized understanding.    Mom made aware of O/P services, breastfeeding support groups, community resources, and our phone # for post-discharge questions.  Mother has a DEBP for home use and will return to work after leave.  Encouraged her to call her RN?LC for latch assistance as needed.     Maternal Data Formula Feeding for Exclusion: No Has patient been taught Hand Expression?: Yes Does the patient have breastfeeding experience prior to this delivery?: Yes  Feeding    LATCH Score                   Interventions    Lactation Tools Discussed/Used     Consult Status Consult Status: Follow-up Date: 06/02/19 Follow-up type: In-patient    Dysen Edmondson R Jaedon Siler 06/01/2019, 11:53 AM

## 2019-06-01 NOTE — Discharge Summary (Signed)
Postpartum Discharge Summary     Patient Name: Janet [name] DOB: 1985/12/04 MRN: [id number]  Date of admission: 05/29/2019 Delivering Provider: Matilde Haymaker   Date of discharge: 06/02/2019  Admitting diagnosis: pregnancy Intrauterine pregnancy: [redacted]w[redacted]d     Secondary diagnosis:  Active Problems:   Morbid obesity ([place])   Chiari malformation type I ([place])   History of postpartum hemorrhage   H/O preeclampsia and PP HTN   Preeclampsia   Positive GBS test  Additional problems: none     Discharge diagnosis: Term Pregnancy Delivered and Preeclampsia (mild)                                                                                                Post partum procedures:none  Augmentation: AROM, Pitocin, Cytotec and Foley Balloon  Complications: None  Hospital course:  Induction of Labor With Vaginal Delivery   33 y.o. yo G2P1001 at [redacted]w[redacted]d was admitted to the hospital 05/29/2019 for induction of labor.  Indication for induction: Preeclampsia without severe features. Upon admission she had a P/C ratio of 0.22 with neg bloodwork. Pressures were elevated but not in severe range. Vanc was given for GBS ppx. She underwent the usual cx ripening procedures with a significant amount of time on Pitocin s/p cervical foley due to the vtx being too high for AROM. Once her water was broken she delivered within 8 hrs. TXA was given post placentally due to her hx of PPH with good results.  Membrane Rupture Time/Date: 5:49 PM ,05/31/2019   Intrapartum Procedures: Episiotomy: None [1]                                         Lacerations:  None [1]  Patient had delivery of a Viable infant.  Information for the patient's newborn:  Zuria, Sthill [id number]      06/01/2019  Details of delivery can be found in separate delivery note.  Patient had a routine postpartum course with only a single mild range pressure but otherwise normotensive. Patient is discharged home 06/02/19.  Magnesium Sulfate  recieved: No BMZ received: No  Physical exam  Vitals:   06/01/19 1320 06/01/19 1700 06/01/19 2159 06/02/19 0400  BP: 138/73 130/85 138/79 135/76  Pulse: 81 83 73 66  Resp: 16 18 18 18   Temp: 98.2 F (36.8 C) 98.6 F (37 C) 98.2 F (36.8 C) 98.2 F (36.8 C)  TempSrc: Oral Oral Oral Oral  SpO2: 99% 100% 99% 100%  Weight:      Height:       General: alert, cooperative and no distress Lochia: appropriate Uterine Fundus: firm Incision: N/A DVT Evaluation: No evidence of DVT seen on physical exam. Labs: Lab Results  Component Value Date   WBC 15.6 (H) 06/01/2019   HGB 9.7 (L) 06/01/2019   HCT 29.2 (L) 06/01/2019   MCV 85.9 06/01/2019   PLT 260 06/01/2019   CMP Latest Ref Rng & Units 05/29/2019  Glucose 70 - 99 mg/dL 93  BUN 6 - 20 mg/dL  7  Creatinine 0.44 - 1.00 mg/dL 0.61  Sodium 135 - 145 mmol/L 138  Potassium 3.5 - 5.1 mmol/L 3.1(L)  Chloride 98 - 111 mmol/L 108  CO2 22 - 32 mmol/L 20(L)  Calcium 8.9 - 10.3 mg/dL 8.8(L)  Total Protein 6.5 - 8.1 g/dL 6.0(L)  Total Bilirubin 0.3 - 1.2 mg/dL 0.7  Alkaline Phos 38 - 126 U/L 116  AST 15 - 41 U/L 27  ALT 0 - 44 U/L 17    Discharge instruction: per After Visit Summary and "Baby and Me Booklet".  After visit meds:  Allergies as of 06/02/2019      Reactions   Methylprednisolone Shortness Of Breath, Itching, Other (See Comments)   Reaction:  Bruising    Penicillins Anaphylaxis, Rash, Other (See Comments)   Has patient had a PCN reaction causing immediate rash, facial/tongue/throat swelling, SOB or lightheadedness with hypotension: No Has patient had a PCN reaction causing severe rash involving mucus membranes or skin necrosis: No Has patient had a PCN reaction that required hospitalization No Has patient had a PCN reaction occurring within the last 10 years: No If all of the above answers are "NO", then may proceed with Cephalosporin use.   Latex Rash   Neomycin Rash   Breaks out with neosporin      Medication List     TAKE these medications   acetaminophen 325 MG tablet Commonly known as: Tylenol Take 2 tablets (650 mg total) by mouth every 4 (four) hours as needed (for pain scale < 4).   ibuprofen 600 MG tablet Commonly known as: ADVIL Take 1 tablet (600 mg total) by mouth every 6 (six) hours.   Iron (Ferrous Sulfate) 325 (65 Fe) MG Tabs Take 1 tablet by mouth every other day. What changed:   medication strength  how much to take  when to take this   omeprazole 20 MG capsule Commonly known as: PRILOSEC Take 20 mg by mouth as needed.   prenatal vitamin w/FE, FA 27-1 MG Tabs tablet Take 1 tablet by mouth daily at 12 noon.       Diet: routine diet  Activity: Advance as tolerated. Pelvic rest for 6 weeks.   Outpatient follow up:BP check in 1 wk; PP visit in 4 wks Follow up Appt: Future Appointments  Date Time Provider [place]  07/04/2019 10:10 AM Cresenzo-Dishmon, Joaquim Lai, CNM [facility]   Follow up Visit:  Please schedule this patient for Postpartum visit in: BP check early this week; PP visit 4wks with the following provider: Any provider For C/S patients schedule nurse incision check in weeks 2 weeks: no High risk pregnancy complicated by: pre-e without severe features Delivery mode:  SVD Anticipated Birth Control:  POPs PP Procedures needed: BP check  Schedule Integrated BH visit: no   Newborn Data: Live born female  Birth Weight: 3340gm (7lb 5.8oz)  APGAR: 47, 9  Newborn Delivery   Birth date/time: 06/01/2019 01:50:00 Delivery type: Vaginal, Spontaneous      Baby Feeding: Breast Disposition:home with mother   06/02/2019 Clarnce Flock, MD

## 2019-06-02 MED ORDER — IBUPROFEN 600 MG PO TABS: 600.0000 mg | ORAL_TABLET | Freq: Four times a day (QID) | ORAL | 0 refills | Status: DC

## 2019-06-02 MED ORDER — ACETAMINOPHEN 325 MG PO TABS: 650.0000 mg | ORAL_TABLET | ORAL | 0 refills | Status: DC | PRN

## 2019-06-02 MED ORDER — IRON (FERROUS SULFATE) 325 (65 FE) MG PO TABS: 1.0000 | ORAL_TABLET | ORAL | 1 refills | Status: DC

## 2019-06-02 NOTE — Discharge Instructions (Signed)
Postpartum Care After Vaginal Delivery °This sheet gives you information about how to care for yourself from the time you deliver your baby to up to 6-12 weeks after delivery (postpartum period). Your health care provider may also give you more specific instructions. If you have problems or questions, contact your health care provider. °Follow these instructions at home: °Vaginal bleeding °· It is normal to have vaginal bleeding (lochia) after delivery. Wear a sanitary pad for vaginal bleeding and discharge. °? During the first week after delivery, the amount and appearance of lochia is often similar to a menstrual period. °? Over the next few weeks, it will gradually decrease to a dry, yellow-brown discharge. °? For most women, lochia stops completely by 4-6 weeks after delivery. Vaginal bleeding can vary from woman to woman. °· Change your sanitary pads frequently. Watch for any changes in your flow, such as: °? A sudden increase in volume. °? A change in color. °? Large blood clots. °· If you pass a blood clot from your vagina, save it and call your health care provider to discuss. Do not flush blood clots down the toilet before talking with your health care provider. °· Do not use tampons or douches until your health care provider says this is safe. °· If you are not breastfeeding, your period should return 6-8 weeks after delivery. If you are feeding your child breast milk only (exclusive breastfeeding), your period may not return until you stop breastfeeding. °Perineal care °· Keep the area between the vagina and the anus (perineum) clean and dry as told by your health care provider. Use medicated pads and pain-relieving sprays and creams as directed. °· If you had a cut in the perineum (episiotomy) or a tear in the vagina, check the area for signs of infection until you are healed. Check for: °? More redness, swelling, or pain. °? Fluid or blood coming from the cut or tear. °? Warmth. °? Pus or a bad  smell. °· You may be given a squirt bottle to use instead of wiping to clean the perineum area after you go to the bathroom. As you start healing, you may use the squirt bottle before wiping yourself. Make sure to wipe gently. °· To relieve pain caused by an episiotomy, a tear in the vagina, or swollen veins in the anus (hemorrhoids), try taking a warm sitz bath 2-3 times a day. A sitz bath is a warm water bath that is taken while you are sitting down. The water should only come up to your hips and should cover your buttocks. °Breast care °· Within the first few days after delivery, your breasts may feel heavy, full, and uncomfortable (breast engorgement). Milk may also leak from your breasts. Your health care provider can suggest ways to help relieve the discomfort. Breast engorgement should go away within a few days. °· If you are breastfeeding: °? Wear a bra that supports your breasts and fits you well. °? Keep your nipples clean and dry. Apply creams and ointments as told by your health care provider. °? You may need to use breast pads to absorb milk that leaks from your breasts. °? You may have uterine contractions every time you breastfeed for up to several weeks after delivery. Uterine contractions help your uterus return to its normal size. °? If you have any problems with breastfeeding, work with your health care provider or lactation consultant. °· If you are not breastfeeding: °? Avoid touching your breasts a lot. Doing this can make   your breasts produce more milk. °? Wear a good-fitting bra and use cold packs to help with swelling. °? Do not squeeze out (express) milk. This causes you to make more milk. °Intimacy and sexuality °· Ask your health care provider when you can engage in sexual activity. This may depend on: °? Your risk of infection. °? How fast you are healing. °? Your comfort and desire to engage in sexual activity. °· You are able to get pregnant after delivery, even if you have not had  your period. If desired, talk with your health care provider about methods of birth control (contraception). °Medicines °· Take over-the-counter and prescription medicines only as told by your health care provider. °· If you were prescribed an antibiotic medicine, take it as told by your health care provider. Do not stop taking the antibiotic even if you start to feel better. °Activity °· Gradually return to your normal activities as told by your health care provider. Ask your health care provider what activities are safe for you. °· Rest as much as possible. Try to rest or take a nap while your baby is sleeping. °Eating and drinking ° °· Drink enough fluid to keep your urine pale yellow. °· Eat high-fiber foods every day. These may help prevent or relieve constipation. High-fiber foods include: °? Whole grain cereals and breads. °? Brown rice. °? Beans. °? Fresh fruits and vegetables. °· Do not try to lose weight quickly by cutting back on calories. °· Take your prenatal vitamins until your postpartum checkup or until your health care provider tells you it is okay to stop. °Lifestyle °· Do not use any products that contain nicotine or tobacco, such as cigarettes and e-cigarettes. If you need help quitting, ask your health care provider. °· Do not drink alcohol, especially if you are breastfeeding. °General instructions °· Keep all follow-up visits for you and your baby as told by your health care provider. Most women visit their health care provider for a postpartum checkup within the first 3-6 weeks after delivery. °Contact a health care provider if: °· You feel unable to cope with the changes that your child brings to your life, and these feelings do not go away. °· You feel unusually sad or worried. °· Your breasts become red, painful, or hard. °· You have a fever. °· You have trouble holding urine or keeping urine from leaking. °· You have little or no interest in activities you used to enjoy. °· You have not  breastfed at all and you have not had a menstrual period for 12 weeks after delivery. °· You have stopped breastfeeding and you have not had a menstrual period for 12 weeks after you stopped breastfeeding. °· You have questions about caring for yourself or your baby. °· You pass a blood clot from your vagina. °Get help right away if: °· You have chest pain. °· You have difficulty breathing. °· You have sudden, severe leg pain. °· You have severe pain or cramping in your lower abdomen. °· You bleed from your vagina so much that you fill more than one sanitary pad in one hour. Bleeding should not be heavier than your heaviest period. °· You develop a severe headache. °· You faint. °· You have blurred vision or spots in your vision. °· You have bad-smelling vaginal discharge. °· You have thoughts about hurting yourself or your baby. °If you ever feel like you may hurt yourself or others, or have thoughts about taking your own life, get help   right away. You can go to the nearest emergency department or call:  Your local emergency services (911 in the [place]  A suicide crisis helpline, such as the [facility] at 417-178-5842. This is open 24 hours a day. Summary  The period of time right after you deliver your newborn up to 6-12 weeks after delivery is called the postpartum period.  Gradually return to your normal activities as told by your health care provider.  Keep all follow-up visits for you and your baby as told by your health care provider. This information is not intended to replace advice given to you by your health care provider. Make sure you discuss any questions you have with your health care provider. Document Released: 07/24/2007 Document Revised: 09/29/2017 Document Reviewed: 07/10/2017 Elsevier Patient Education  2020 [employer].    Postpartum Hypertension Postpartum hypertension is high blood pressure that remains higher than normal after childbirth. You  may not realize that you have postpartum hypertension if your blood pressure is not being checked regularly. In most cases, postpartum hypertension will go away on its own, usually within a week of delivery. However, for some women, medical treatment is required to prevent serious complications, such as seizures or stroke. What are the causes? This condition may be caused by one or more of the following:  Hypertension that existed before pregnancy (chronic hypertension).  Hypertension that comes on as a result of pregnancy (gestational hypertension).  Hypertensive disorders during pregnancy (preeclampsia) or seizures in women who have high blood pressure during pregnancy (eclampsia).  A condition in which the liver, platelets, and red blood cells are damaged during pregnancy (HELLP syndrome).  A condition in which the thyroid produces too much hormones (hyperthyroidism).  Other rare problems of the nerves (neurological disorders) or blood disorders. In some cases, the cause may not be known. What increases the risk? The following factors may make you more likely to develop this condition:  Chronic hypertension. In some cases, this may not have been diagnosed before pregnancy.  Obesity.  Type 2 diabetes.  Kidney disease.  History of preeclampsia or eclampsia.  Other medical conditions that change the level of hormones in the body (hormonal imbalance). What are the signs or symptoms? As with all types of hypertension, postpartum hypertension may not have any symptoms. Depending on how high your blood pressure is, you may experience:  Headaches. These may be mild, moderate, or severe. They may also be steady, constant, or sudden in onset (thunderclap headache).  Changes in your ability to see (visual changes).  Dizziness.  Shortness of breath.  Swelling of your hands, feet, lower legs, or face. In some cases, you may have swelling in more than one of these locations.  Heart  palpitations or a racing heartbeat.  Difficulty breathing while lying down.  Decrease in the amount of urine that you pass. Other rare signs and symptoms may include:  Sweating more than usual. This lasts longer than a few days after delivery.  Chest pain.  Sudden dizziness when you get up from sitting or lying down.  Seizures.  Nausea or vomiting.  Abdominal pain. How is this diagnosed? This condition may be diagnosed based on the results of a physical exam, blood pressure measurements, and blood and urine tests. You may also have other tests, such as a CT scan or an MRI, to check for other problems of postpartum hypertension. How is this treated? If blood pressure is high enough to require treatment, your options may include:  Medicines to reduce blood pressure (antihypertensives). Tell your health care provider if you are breastfeeding or if you plan to breastfeed. There are many antihypertensive medicines that are safe to take while breastfeeding.  Stopping medicines that may be causing hypertension.  Treating medical conditions that are causing hypertension.  Treating the complications of hypertension, such as seizures, stroke, or kidney problems. Your health care provider will also continue to monitor your blood pressure closely until it is within a safe range for you. Follow these instructions at home:  Take over-the-counter and prescription medicines only as told by your health care provider.  Return to your normal activities as told by your health care provider. Ask your health care provider what activities are safe for you.  Do not use any products that contain nicotine or tobacco, such as cigarettes and e-cigarettes. If you need help quitting, ask your health care provider.  Keep all follow-up visits as told by your health care provider. This is important. Contact a health care provider if:  Your symptoms get worse.  You have new symptoms, such as: ? A  headache that does not get better. ? Dizziness. ? Visual changes. Get help right away if:  You suddenly develop swelling in your hands, ankles, or face.  You have sudden, rapid weight gain.  You develop difficulty breathing, chest pain, racing heartbeat, or heart palpitations.  You develop severe pain in your abdomen.  You have any symptoms of a stroke. "BE FAST" is an easy way to remember the main warning signs of a stroke: ? B - Balance. Signs are dizziness, sudden trouble walking, or loss of balance. ? E - Eyes. Signs are trouble seeing or a sudden change in vision. ? F - Face. Signs are sudden weakness or numbness of the face, or the face or eyelid drooping on one side. ? A - Arms. Signs are weakness or numbness in an arm. This happens suddenly and usually on one side of the body. ? S - Speech. Signs are sudden trouble speaking, slurred speech, or trouble understanding what people say. ? T - Time. Time to call emergency services. Write down what time symptoms started.  You have other signs of a stroke, such as: ? A sudden, severe headache with no known cause. ? Nausea or vomiting. ? Seizure. These symptoms may represent a serious problem that is an emergency. Do not wait to see if the symptoms will go away. Get medical help right away. Call your local emergency services (911 in the [place] Do not drive yourself to the hospital. Summary  Postpartum hypertension is high blood pressure that remains higher than normal after childbirth.  In most cases, postpartum hypertension will go away on its own, usually within a week of delivery.  For some women, medical treatment is required to prevent serious complications, such as seizures or stroke. This information is not intended to replace advice given to you by your health care provider. Make sure you discuss any questions you have with your health care provider. Document Released: 05/30/2014 Document Revised: 11/02/2018 Document  Reviewed: 07/17/2017 Elsevier Patient Education  2020 [employer].

## 2019-06-04 ENCOUNTER — Telehealth: Payer: Self-pay | Admitting: Obstetrics & Gynecology

## 2019-06-04 ENCOUNTER — Other Ambulatory Visit: Payer: Self-pay

## 2019-06-04 ENCOUNTER — Other Ambulatory Visit: Payer: 59

## 2019-06-04 MED ORDER — AMLODIPINE BESYLATE 10 MG PO TABS: 10.0000 mg | ORAL_TABLET | Freq: Every day | ORAL | 1 refills | Status: DC

## 2019-06-04 NOTE — Progress Notes (Signed)
Pt in for bp check due to very bad headaches. Patient bp is 160/100. She has a trace of protein on her urine dip. Dr. Elonda Husky spoke with patient and is sending in rx.

## 2019-06-06 ENCOUNTER — Other Ambulatory Visit: Payer: Self-pay

## 2019-06-06 ENCOUNTER — Inpatient Hospital Stay (HOSPITAL_COMMUNITY)
Admission: AD | Admit: 2019-06-06 | Discharge: 2019-06-07 | DRG: 776 | Disposition: A | Discharge status: Home / Self Care | Payer: 59 | Attending: Obstetrics & Gynecology | Admitting: Obstetrics & Gynecology

## 2019-06-06 ENCOUNTER — Encounter (HOSPITAL_COMMUNITY): Payer: Self-pay

## 2019-06-06 DIAGNOSIS — Z880 Allergy status to penicillin: Secondary | ICD-10-CM

## 2019-06-06 DIAGNOSIS — O1495 Unspecified pre-eclampsia, complicating the puerperium: Secondary | ICD-10-CM | POA: Diagnosis present

## 2019-06-06 DIAGNOSIS — R51 Headache: Secondary | ICD-10-CM | POA: Diagnosis present

## 2019-06-06 DIAGNOSIS — Z20828 Contact with and (suspected) exposure to other viral communicable diseases: Secondary | ICD-10-CM | POA: Diagnosis present

## 2019-06-06 LAB — COMPREHENSIVE METABOLIC PANEL
ALT: 29 U/L (ref 0–44)
AST: 30 U/L (ref 15–41)
Albumin: 2.8 g/dL — ABNORMAL LOW (ref 3.5–5.0)
Alkaline Phosphatase: 106 U/L (ref 38–126)
Anion gap: 12 (ref 5–15)
BUN: 6 mg/dL (ref 6–20)
CO2: 24 mmol/L (ref 22–32)
Calcium: 9.3 mg/dL (ref 8.9–10.3)
Chloride: 105 mmol/L (ref 98–111)
Creatinine, Ser: 0.67 mg/dL (ref 0.44–1.00)
GFR calc Af Amer: >60 mL/min (ref >60)
GFR calc non Af Amer: >60 mL/min (ref >60)
Glucose, Bld: 97 mg/dL (ref 70–99)
Potassium: 3.1 mmol/L — ABNORMAL LOW (ref 3.5–5.1)
Sodium: 141 mmol/L (ref 135–145)
Total Bilirubin: 0.5 mg/dL (ref 0.3–1.2)
Total Protein: 6.2 g/dL — ABNORMAL LOW (ref 6.5–8.1)

## 2019-06-06 LAB — CBC
HCT: 32.6 % — ABNORMAL LOW (ref 36.0–46.0)
Hemoglobin: 10.6 g/dL — ABNORMAL LOW (ref 12.0–15.0)
MCH: 28.3 pg (ref 26.0–34.0)
MCHC: 32.5 g/dL (ref 30.0–36.0)
MCV: 86.9 fL (ref 80.0–100.0)
Platelets: 411 10*3/uL — ABNORMAL HIGH (ref 150–400)
RBC: 3.75 MIL/uL — ABNORMAL LOW (ref 3.87–5.11)
RDW: 13.3 % (ref 11.5–15.5)
WBC: 7.1 10*3/uL (ref 4.0–10.5)
nRBC: 0 % (ref 0.0–0.2)

## 2019-06-06 LAB — SARS CORONAVIRUS 2 (TAT 6-24 HRS): SARS Coronavirus 2: NEGATIVE

## 2019-06-06 MED ORDER — LACTATED RINGERS IV SOLN
INTRAVENOUS | Status: DC
  Administered 2019-06-06 (×3): via INTRAVENOUS

## 2019-06-06 MED ORDER — ACETAMINOPHEN 325 MG PO TABS
650.0000 mg | ORAL_TABLET | ORAL | Status: DC | PRN
  Administered 2019-06-06 – 2019-06-07 (×3): 650 mg via ORAL
  Filled 2019-06-06 (×3): qty 2

## 2019-06-06 MED ORDER — AMLODIPINE BESYLATE 10 MG PO TABS
10.0000 mg | ORAL_TABLET | Freq: Every day | ORAL | Status: DC
  Administered 2019-06-06 – 2019-06-07 (×2): 10 mg via ORAL
  Filled 2019-06-06 (×3): qty 1

## 2019-06-06 MED ORDER — HYDRALAZINE HCL 20 MG/ML IJ SOLN
10.0000 mg | INTRAMUSCULAR | Status: DC | PRN
  Filled 2019-06-06: qty 0.5

## 2019-06-06 MED ORDER — LABETALOL HCL 5 MG/ML IV SOLN: 40.0000 mg | INTRAVENOUS | Status: DC | PRN

## 2019-06-06 MED ORDER — PRENATAL PLUS 27-1 MG PO TABS
1.0000 | ORAL_TABLET | Freq: Every day | ORAL | Status: DC
  Administered 2019-06-06: 1 via ORAL
  Filled 2019-06-06 (×2): qty 1

## 2019-06-06 MED ORDER — LABETALOL HCL 5 MG/ML IV SOLN: 80.0000 mg | INTRAVENOUS | Status: DC | PRN

## 2019-06-06 MED ORDER — IBUPROFEN 600 MG PO TABS
600.0000 mg | ORAL_TABLET | Freq: Four times a day (QID) | ORAL | Status: DC
  Administered 2019-06-06: 600 mg via ORAL
  Filled 2019-06-06 (×3): qty 1

## 2019-06-06 MED ORDER — BUTALBITAL-APAP-CAFFEINE 50-325-40 MG PO TABS
2.0000 | ORAL_TABLET | Freq: Four times a day (QID) | ORAL | Status: DC | PRN
  Administered 2019-06-06: 2 via ORAL
  Filled 2019-06-06: qty 2

## 2019-06-06 MED ORDER — MAGNESIUM SULFATE BOLUS VIA INFUSION
4.0000 g | Freq: Once | INTRAVENOUS | Status: AC
  Administered 2019-06-06: 4 g via INTRAVENOUS
  Filled 2019-06-06: qty 500

## 2019-06-06 MED ORDER — PRENATAL PLUS 27-1 MG PO TABS
1.0000 | ORAL_TABLET | Freq: Every day | ORAL | Status: DC
  Filled 2019-06-06 (×2): qty 1

## 2019-06-06 MED ORDER — LABETALOL HCL 5 MG/ML IV SOLN: 20.0000 mg | INTRAVENOUS | Status: DC | PRN

## 2019-06-06 MED ORDER — MAGNESIUM SULFATE 40 G IN LACTATED RINGERS - SIMPLE
2.0000 g/h | INTRAVENOUS | Status: AC
  Administered 2019-06-06 (×2): 2 g/h via INTRAVENOUS
  Filled 2019-06-06 (×2): qty 500

## 2019-06-06 NOTE — H&P (Signed)
Janet [name] is a 33 y.o. female at 5 days postpartum presenting for headache in setting of known PreEclampsia.  Patient states she was started on Norvasc on Tuesday after an elevated bp (160/100) in the office.  She states that she has had a headache since prior to discharge form the hospital.  She states she has taken tylenol without relief and the pain is on her left side and causes eye pain.  Patient states that she was not given MgSO4 during labor.    OB History    Gravida  2   Para  2   Term  2   Preterm  0   AB  0   Living  2     SAB  0   TAB  0   Ectopic  0   Multiple  0   Live Births  2          Past Medical History:  Diagnosis Date  . 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 Carpal tunnel syndrome during pregnancy   . 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 Cushing's syndrome ([place]) 11/27/2013  . Gastroesophageal reflux disease   . Hematuria 09/18/2015  . Menstrual periods irregular 09/18/2015  . Migraine without aura, with intractable migraine, so stated, without mention of status migrainosus 11/11/2013  . Patient desires pregnancy 11/27/2013  . Pregnant 12/16/2015  . Vitamin D deficiency    Past Surgical History:  Procedure Laterality Date  . CHOLECYSTECTOMY  02/2015  . COLONOSCOPY N/A 04/19/2013   [id number] mucosa in the terminal ileum/Single erosion in transverse colon/RECTAL BLEEDING DUE TO Moderate sized internal hemorrhoids/ trv colon erosion, bx benign.  . ESOPHAGOGASTRODUODENOSCOPY N/A 04/19/2013   [id number] Non-erosive gastritis/PERI-UMBILICAL PAIN DUE TO GERD, GASTRITIS, AND CONSTIPATION. Bx with mild chronic inactive gastritis. No H.Pylori  . HEMORRHOID BANDING  2015   Dr. Gala Romney- in office banding procedure  . TONSILLECTOMY    . 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 Social History:  reports that she has never smoked. She has never used smokeless tobacco. She reports that she does not drink alcohol or use drugs.   Review of Systems  Constitutional: Negative for chills and fever.  Eyes: Positive for pain.  Respiratory: Negative for cough and shortness of breath.   Gastrointestinal: Negative for abdominal pain, heartburn, nausea and vomiting.  Neurological: Positive for headaches. Negative for dizziness.     Blood pressure 138/85, pulse 71, temperature 98.4 F (36.9 C), temperature source Oral, resp. rate 16, last menstrual period 09/12/2018, SpO2 99 %, currently breastfeeding. Physical Exam  Constitutional: She is oriented to person, place, and time. She appears well-developed and well-nourished. No distress.  HENT:  Head: Normocephalic and atraumatic.  Eyes: Conjunctivae are normal.  Neck: Normal range of motion.  Cardiovascular: Normal rate, regular rhythm and normal heart sounds.  Respiratory: Effort normal and breath sounds normal.  GI: Soft. There is no abdominal tenderness.  Genitourinary:    Genitourinary Comments: Lochia appears appropriate   Musculoskeletal: Normal range of motion.  Neurological: She is alert and oriented to person, place, and time.  Skin: Skin is warm and dry.  Psychiatric: She has a normal mood and affect. Her behavior is normal.    Prenatal labs: ABO, Rh: --/--/B POS, B POS Performed at [facility], [place] [address], [place], [place] [address]  [phone]) Antibody: NEG (08/19 0815) Rubella: 8.78 (02/11 1631) RPR: Non Reactive (08/19 0818)  HBsAg: Negative (02/11 1631)  HIV: Non Reactive (06/16 0845)   GBS: Positive (08/11 0000)   Assessment/Plan: Postpartum State PreEclampsia Elevated BP Headache  -Patient informed that based on history and presenting symptoms recommendation would be for admission with MgSO4 infusions. -Dr. Idolina Primer consulted and agrees with admission. -Patient informed of POC -Will continue on Norvasc and start MgSO4 per protocol. -Fioricet for headache pain. -Admit to Antepartum for observation per consult with Dr. Marland KitchenRoutine Postpartum PreEclampsia Orders [place] MSN, CNM 06/06/2019, 4:50 AM

## 2019-06-06 NOTE — MAU Note (Signed)
Pt states she was induced for pre-e at 37 weeks.   Pt reports left sided h/a.   Pt took BP at home 159/98 and called office and was told to come in.   Reports vaginal blood clots.

## 2019-06-07 MED ORDER — CYCLOBENZAPRINE HCL 10 MG PO TABS: 10.0000 mg | ORAL_TABLET | Freq: Three times a day (TID) | ORAL | 0 refills | Status: DC | PRN

## 2019-06-07 NOTE — Discharge Summary (Signed)
Physician Discharge Summary  Patient ID: Janet [name] MRN: [id number] DOB/AGE: 04/16/1986 33 y.o.  Admit date: 06/06/2019 Discharge date: 06/07/2019   Discharge Diagnoses:  Active Problems:   Preeclampsia in postpartum period   Consults: None  Significant Diagnostic Studies:  CMP Latest Ref Rng & Units 06/06/2019 05/29/2019 05/21/2019  Glucose 70 - 99 mg/dL 97 93 76  BUN 6 - 20 mg/dL 6 7 8   Creatinine 0.44 - 1.00 mg/dL 0.67 0.61 0.46(L)  Sodium 135 - 145 mmol/L 141 138 137  Potassium 3.5 - 5.1 mmol/L 3.1(L) 3.1(L) 4.0  Chloride 98 - 111 mmol/L 105 108 103  CO2 22 - 32 mmol/L 24 20(L) 21  Calcium 8.9 - 10.3 mg/dL 9.3 8.8(L) 8.4(L)  Total Protein 6.5 - 8.1 g/dL 6.2(L) 6.0(L) 5.9(L)  Total Bilirubin 0.3 - 1.2 mg/dL 0.5 0.7 <0.2  Alkaline Phos 38 - 126 U/L 106 116 119(H)  AST 15 - 41 U/L 30 27 17   ALT 0 - 44 U/L 29 17 11    CBC    Component Value Date/Time   WBC 7.1 06/06/2019 0448   RBC 3.75 (L) 06/06/2019 0448   HGB 10.6 (L) 06/06/2019 0448   HGB 11.3 05/21/2019 1628   HCT 32.6 (L) 06/06/2019 0448   HCT 34.8 05/21/2019 1628   PLT 411 (H) 06/06/2019 0448   PLT 300 05/21/2019 1628   MCV 86.9 06/06/2019 0448   MCV 87 05/21/2019 1628   MCH 28.3 06/06/2019 0448   MCHC 32.5 06/06/2019 0448   RDW 13.3 06/06/2019 0448   RDW 13.3 05/21/2019 1628   LYMPHSABS 2.2 11/20/2018 1631   MONOABS 0.7 12/09/2011 1447   EOSABS 0.1 11/20/2018 1631   BASOSABS 0.0 11/20/2018 1631       Hospital Course: Admitted with severe headache and elevated BPs. Given magnesium x 24 hour. Headache improved. BP under control. Norvasc continued Stable for discharge.   Disposition: Discharge disposition: 01-Home or Self Care       Discharged Condition: improved  Discharge Instructions    Activity as tolerated   Complete by: As directed    Ambulatory referral to Lactation   Complete by: As directed    Reason for consult: The Mother-Infant Dyad Needs Assistance in the Continuation of  Breastfeeding   Call MD for:  persistant nausea and vomiting   Complete by: As directed    Call MD for:  severe uncontrolled pain   Complete by: As directed    Call MD for:  temperature >100.4   Complete by: As directed    Diet - low sodium heart healthy   Complete by: As directed    No wound care   Complete by: As directed    Sexual acrtivity   Complete by: As directed    None x 6 weeks     Allergies as of 06/07/2019      Reactions   Methylprednisolone Shortness Of Breath, Itching, Other (See Comments)   Reaction:  Bruising    Penicillins Anaphylaxis, Rash, Other (See Comments)   Has patient had a PCN reaction causing immediate rash, facial/tongue/throat swelling, SOB or lightheadedness with hypotension: No Has patient had a PCN reaction causing severe rash involving mucus membranes or skin necrosis: No Has patient had a PCN reaction that required hospitalization No Has patient had a PCN reaction occurring within the last 10 years: No If all of the above answers are "NO", then may proceed with Cephalosporin use.   Latex Rash   Neomycin Rash   Breaks  out with neosporin      Medication List    TAKE these medications   acetaminophen 325 MG tablet Commonly known as: Tylenol Take 2 tablets (650 mg total) by mouth every 4 (four) hours as needed (for pain scale < 4).   amLODipine 10 MG tablet Commonly known as: Norvasc Take 1 tablet (10 mg total) by mouth daily.   cyclobenzaprine 10 MG tablet Commonly known as: FLEXERIL Take 1 tablet (10 mg total) by mouth 3 (three) times daily as needed for muscle spasms (headache).   ibuprofen 600 MG tablet Commonly known as: ADVIL Take 1 tablet (600 mg total) by mouth every 6 (six) hours.   Iron (Ferrous Sulfate) 325 (65 Fe) MG Tabs Take 1 tablet by mouth every other day.   omeprazole 20 MG capsule Commonly known as: PRILOSEC Take 20 mg by mouth as needed.   prenatal vitamin w/FE, FA 27-1 MG Tabs tablet Take 1 tablet by mouth  daily at 12 noon.      Follow-up Information    [facility]. Call in 1 week(s).   Contact information: [place] SSN-[id number] [phone]          Signed: Donnamae Jude 06/07/2019, 8:45 AM

## 2019-06-07 NOTE — Progress Notes (Signed)
Pt discharged home with printed instructions. Pt verbalized an understanding. No concerns noted. Robin Pafford L Geremiah Fussell, RN 

## 2019-06-07 NOTE — Lactation Note (Signed)
Lactation Consultation Note  Patient Name: CHIRSTINE EBSEN [id number] Date: 06/07/2019  Randel Books is 68 days old.  Mom was readmitted for elevated B/P but is going to be discharged today.  Mom states baby has been latching to just one side and she pumps the other.  She has her own breast pump.  Mom reports that milk is in.  She has only pumped once since admission because she thought she had to freeze milk.  Reviewed EBM storage guidelines.  Recommended an outpatient appointment if baby continues to have difficulty with latch.   Maternal Data    Feeding    LATCH Score                   Interventions    Lactation Tools Discussed/Used     Consult Status      Ave Filter 06/07/2019, 9:26 AM

## 2019-06-07 NOTE — Discharge Instructions (Signed)
Postpartum Hypertension Postpartum hypertension is high blood pressure that remains higher than normal after childbirth. You may not realize that you have postpartum hypertension if your blood pressure is not being checked regularly. In most cases, postpartum hypertension will go away on its own, usually within a week of delivery. However, for some women, medical treatment is required to prevent serious complications, such as seizures or stroke. What are the causes? This condition may be caused by one or more of the following:  Hypertension that existed before pregnancy (chronic hypertension).  Hypertension that comes on as a result of pregnancy (gestational hypertension).  Hypertensive disorders during pregnancy (preeclampsia) or seizures in women who have high blood pressure during pregnancy (eclampsia).  A condition in which the liver, platelets, and red blood cells are damaged during pregnancy (HELLP syndrome).  A condition in which the thyroid produces too much hormones (hyperthyroidism).  Other rare problems of the nerves (neurological disorders) or blood disorders. In some cases, the cause may not be known. What increases the risk? The following factors may make you more likely to develop this condition:  Chronic hypertension. In some cases, this may not have been diagnosed before pregnancy.  Obesity.  Type 2 diabetes.  Kidney disease.  History of preeclampsia or eclampsia.  Other medical conditions that change the level of hormones in the body (hormonal imbalance). What are the signs or symptoms? As with all types of hypertension, postpartum hypertension may not have any symptoms. Depending on how high your blood pressure is, you may experience:  Headaches. These may be mild, moderate, or severe. They may also be steady, constant, or sudden in onset (thunderclap headache).  Changes in your ability to see (visual changes).  Dizziness.  Shortness of breath.  Swelling  of your hands, feet, lower legs, or face. In some cases, you may have swelling in more than one of these locations.  Heart palpitations or a racing heartbeat.  Difficulty breathing while lying down.  Decrease in the amount of urine that you pass. Other rare signs and symptoms may include:  Sweating more than usual. This lasts longer than a few days after delivery.  Chest pain.  Sudden dizziness when you get up from sitting or lying down.  Seizures.  Nausea or vomiting.  Abdominal pain. How is this diagnosed? This condition may be diagnosed based on the results of a physical exam, blood pressure measurements, and blood and urine tests. You may also have other tests, such as a CT scan or an MRI, to check for other problems of postpartum hypertension. How is this treated? If blood pressure is high enough to require treatment, your options may include:  Medicines to reduce blood pressure (antihypertensives). Tell your health care provider if you are breastfeeding or if you plan to breastfeed. There are many antihypertensive medicines that are safe to take while breastfeeding.  Stopping medicines that may be causing hypertension.  Treating medical conditions that are causing hypertension.  Treating the complications of hypertension, such as seizures, stroke, or kidney problems. Your health care provider will also continue to monitor your blood pressure closely until it is within a safe range for you. Follow these instructions at home:  Take over-the-counter and prescription medicines only as told by your health care provider.  Return to your normal activities as told by your health care provider. Ask your health care provider what activities are safe for you.  Do not use any products that contain nicotine or tobacco, such as cigarettes and e-cigarettes. If   you need help quitting, ask your health care provider. °· Keep all follow-up visits as told by your health care provider. This  is important. °Contact a health care provider if: °· Your symptoms get worse. °· You have new symptoms, such as: °? A headache that does not get better. °? Dizziness. °? Visual changes. °Get help right away if: °· You suddenly develop swelling in your hands, ankles, or face. °· You have sudden, rapid weight gain. °· You develop difficulty breathing, chest pain, racing heartbeat, or heart palpitations. °· You develop severe pain in your abdomen. °· You have any symptoms of a stroke. "BE FAST" is an easy way to remember the main warning signs of a stroke: °? B - Balance. Signs are dizziness, sudden trouble walking, or loss of balance. °? E - Eyes. Signs are trouble seeing or a sudden change in vision. °? F - Face. Signs are sudden weakness or numbness of the face, or the face or eyelid drooping on one side. °? A - Arms. Signs are weakness or numbness in an arm. This happens suddenly and usually on one side of the body. °? S - Speech. Signs are sudden trouble speaking, slurred speech, or trouble understanding what people say. °? T - Time. Time to call emergency services. Write down what time symptoms started. °· You have other signs of a stroke, such as: °? A sudden, severe headache with no known cause. °? Nausea or vomiting. °? Seizure. °These symptoms may represent a serious problem that is an emergency. Do not wait to see if the symptoms will go away. Get medical help right away. Call your local emergency services (911 in the [place] Do not drive yourself to the hospital. °Summary °· Postpartum hypertension is high blood pressure that remains higher than normal after childbirth. °· In most cases, postpartum hypertension will go away on its own, usually within a week of delivery. °· For some women, medical treatment is required to prevent serious complications, such as seizures or stroke. °This information is not intended to replace advice given to you by your health care provider. Make sure you discuss any questions  you have with your health care provider. °Document Released: 05/30/2014 Document Revised: 11/02/2018 Document Reviewed: 07/17/2017 °Elsevier Patient Education © 2020 [employer] ° °

## 2019-06-10 ENCOUNTER — Telehealth: Payer: 59 | Admitting: *Deleted

## 2019-06-10 ENCOUNTER — Telehealth: Payer: Self-pay | Admitting: *Deleted

## 2019-06-10 ENCOUNTER — Other Ambulatory Visit: Payer: Self-pay

## 2019-06-10 VITALS — BP 136/94 | HR 85

## 2019-06-10 DIAGNOSIS — O1495 Unspecified pre-eclampsia, complicating the puerperium: Secondary | ICD-10-CM

## 2019-06-10 NOTE — Telephone Encounter (Signed)
perfect

## 2019-06-10 NOTE — Progress Notes (Signed)
Called patient for her nurse visit bp check. She is feeling good. No more headaches or blurry vision. She was admitted after her last visit and got mag. Advised patient I will inform Dr. Elonda Husky of her BP and send her a mychart  Message if she needs to do anything else.

## 2019-06-10 NOTE — Telephone Encounter (Signed)
I did a  bp check nurse visit on her today. She was in hospital recently for pp preeclampsia, and got mag. On Amlodipine 10 mg. Her bp today is 136/94. She is feeling well, no headaches or blurry vision.

## 2019-07-04 ENCOUNTER — Other Ambulatory Visit: Payer: Self-pay

## 2019-07-04 ENCOUNTER — Encounter: Payer: Self-pay | Admitting: Advanced Practice Midwife

## 2019-07-04 ENCOUNTER — Telehealth (INDEPENDENT_AMBULATORY_CARE_PROVIDER_SITE_OTHER): Payer: 59 | Admitting: Advanced Practice Midwife

## 2019-07-04 DIAGNOSIS — R3121 Asymptomatic microscopic hematuria: Secondary | ICD-10-CM

## 2019-07-04 DIAGNOSIS — O139 Gestational [pregnancy-induced] hypertension without significant proteinuria, unspecified trimester: Secondary | ICD-10-CM

## 2019-07-04 NOTE — Progress Notes (Signed)
TELEHEALTH VIRTUAL POSTPARTUM VISIT ENCOUNTER NOTE  I connected with@ on 07/04/19 at 10:10 AM EDT by telephone at home and verified that I am speaking with the correct person using two identifiers.   I discussed the limitations, risks, security and privacy concerns of performing an evaluation and management service by telephone and the availability of in person appointments. I also discussed with the patient that there may be a patient responsible charge related to this service. The patient expressed understanding and agreed to proceed.  Appointment Date: 07/04/2019  OBGYN Clinic: [facility]  Chief Complaint:  Postpartum Visit  History of Present Illness: Janet [name] is a 33 y.o. African-American [id number] (No LMP recorded.), seen for the above chief complaint. Her past medical history is significant for chronic hematuria (never investigated)  Also thinks that she had DVT in December (saw Janet [name] note said "swelling" and pt says legs were warm to touch.  Was on Micronoir so stopped that.  No US/dopplers  Sister and Gma have hx DVTs, doesn't think either of them have been tested for inherited disorders.  No DVT entered in pt's hx by Dr. Moshe [name]).     She is s/p normal spontaneous vaginal delivery on 8/22 at 37.3 weeks; she was discharged to home on PPD#2. Pregnancy complicated by postpartum severe preeclampsia. Readmitted and given MgS04.  On Norvasc, still taking it.  Daily BPs still have SBP of 140's. Janet [name] is doing well, seeing cardiologist for:    1. 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  2. Tiny patent ductus arteriosus, peak gradient 30 mmHg.  3. Patent foramen ovale with left to right flow.   Complains of intermittent back pain in kidney region. No fever, dysuria, other bladder sx. Sounds like back spasms  Vaginal bleeding or discharge: No  Mode of feeding infant: Breast Intercourse: No  Contraception: condoms PP depression s/s: No .  Any bowel  or bladder issues: No  Pap smear: no abnormalities (date: 2019)  Review of Systems: Positive for back pain. Her 12 point review of systems is negative or as noted in the History of Present Illness.  Patient Active Problem List   Diagnosis Date Noted  . Preeclampsia in postpartum period 06/06/2019  . Gestational hypertension 05/29/2019  . Preeclampsia 05/21/2019  . History of postpartum hemorrhage 11/20/2018  . H/O preeclampsia and PP HTN 11/20/2018  . Stress and adjustment reaction 09/28/2018  . Hematuria 09/18/2015  . Internal hemorrhoids with other complication [id number]  . 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 Common migraine 11/11/2013  . Unspecified constipation 08/16/2013  . Esophageal dysphagia 03/22/2013  . Vitamin D deficiency 03/06/2013  . Prediabetes 03/06/2013  . GERD (gastroesophageal reflux disease) 03/06/2013  . Morbid obesity ([place]) 07/07/2010  . SINUS ARRHYTHMIA 07/07/2010  . ANEMIA 06/23/2010    Medications Janet [name] had no medications administered during this visit. Current Outpatient Medications  Medication Sig Dispense Refill  . acetaminophen (TYLENOL) 325 MG tablet Take 2 tablets (650 mg total) by mouth every 4 (four) hours as needed (for pain scale < 4). 30 tablet 0  . amLODipine (NORVASC) 10 MG tablet Take 1 tablet (10 mg total) by mouth daily. 30 tablet 1  . Iron, Ferrous Sulfate, 325 (65 Fe) MG TABS Take 1 tablet by mouth every other day. 30 tablet 1  . prenatal vitamin w/FE, FA (PRENATAL 1 + 1) 27-1 MG TABS tablet Take 1 tablet by mouth daily at 12  noon. 30 each 12  . VITAMIN D PO Take 50,000 Units by mouth once a week.     No current facility-administered medications for this visit.     Allergies Methylprednisolone, Penicillins, Latex, and Neomycin  Physical Exam:  General:  Alert, oriented and cooperative.   Mental Status: Normal mood and affect perceived. Normal judgment and thought  content.  Rest of physical exam deferred due to type of encounter  PP Depression Screening:   Edinburgh Postnatal Depression Scale - 07/04/19 1019      Edinburgh Postnatal Depression Scale:  In the Past 7 Days   I have been able to laugh and see the funny side of things.  0    I have looked forward with enjoyment to things.  0    I have blamed myself unnecessarily when things went wrong.  0    I have been anxious or worried for no good reason.  0    I have felt scared or panicky for no good reason.  0    Things have been getting on top of me.  0    I have been so unhappy that I have had difficulty sleeping.  0    I have felt sad or miserable.  0    I have been so unhappy that I have been crying.  0    The thought of harming myself has occurred to me.  0    Edinburgh Postnatal Depression Scale Total  0       Assessment:Patient is a 33 y.o. [id number] who is 4 weeks postpartum from a normal spontaneous vaginal delivery.  She is doing well.  GHTN still persistent, controlled w/meds.   Chronic hematuria. .   Plan:  Continue BP meds and daily BP checks.  Once SBP drops into the 130's, stop meds and let me know.   If SBP doesn't drop after 12 weeks, let me know  Urology consult ordered.   Recommended testing for inherited clotting disorder d/t family hx and possible personal hx of DVT  I discussed the assessment and treatment plan with the patient. The patient was provided an opportunity to ask questions and all were answered. The patient agreed with the plan and demonstrated an understanding of the instructions.   The patient was advised to call back or seek an in-person evaluation/go to the ED for any concerning postpartum symptoms.  I provided 20 minutes of non-face-to-face time during this encounter.   Janet [name], [place] for [employer], [place]

## 2019-07-09 ENCOUNTER — Encounter: Payer: Self-pay | Admitting: Advanced Practice Midwife

## 2019-07-10 ENCOUNTER — Encounter: Payer: Self-pay | Admitting: Family Medicine

## 2019-07-10 ENCOUNTER — Ambulatory Visit (INDEPENDENT_AMBULATORY_CARE_PROVIDER_SITE_OTHER): Payer: 59 | Admitting: Family Medicine

## 2019-07-10 ENCOUNTER — Other Ambulatory Visit: Payer: Self-pay

## 2019-07-10 VITALS — BP 130/94 | HR 84 | Temp 97.7°F | Resp 15 | Ht 64.0 in | Wt 215.0 lb

## 2019-07-10 DIAGNOSIS — I10 Essential (primary) hypertension: Secondary | ICD-10-CM | POA: Diagnosis not present

## 2019-07-10 DIAGNOSIS — G4489 Other headache syndrome: Secondary | ICD-10-CM

## 2019-07-10 DIAGNOSIS — Z23 Encounter for immunization: Secondary | ICD-10-CM

## 2019-07-10 MED ORDER — AMLODIPINE BESYLATE 2.5 MG PO TABS: ORAL_TABLET | ORAL | 3 refills | Status: DC

## 2019-07-10 NOTE — Assessment & Plan Note (Signed)
DASH diet and commitment to daily physical activity for a minimum of 30 minutes discussed and encouraged, as a part of hypertension management. The importance of attaining a healthy weight is also discussed.  BP/Weight 07/10/2019 07/04/2019 06/10/2019 06/07/2019 06/04/2019 06/02/2019 Q000111Q  Systolic BP [id number] [id number] Q000111Q 0000000 [id number] -  Diastolic BP 94 84 94 73 [id number] 76 -  Wt. (Lbs) 215 - - - - - 245  BMI 36.9 42.05 - - - - -   Uncontrolled, add amlodipine 2.5 mg daily, re eval in 5 to 6 weeks

## 2019-07-10 NOTE — Progress Notes (Signed)
   Janet [name]     MRN: [id number]      DOB: [birth year]   HPI [name] is here for follow up and re-evaluation of blood pressure following delivery of a 6 week infant, pregnacy complicated hypertension late in pregnancy, has had post partum preeclampsia.  Yesterday had posterior and frontal headache, rated at a 10, effectively d increased her norvasc yesterday when BP was high 149/103 and she also had a severe headache Headache better today, rated at a 5 , and localized to left cheek ROS Denies recent fever or chills. Denies sinus pressure, nasal congestion, ear pain or sore throat. Denies chest congestion, productive cough or wheezing. Denies chest pains, palpitations and leg swelling Denies abdominal pain, nausea, vomiting,diarrhea or constipation.   Denies dysuria, frequency, hesitancy or incontinence. . Denies depression, anxiety or insomnia. Denies skin break down or rash.   PE  BP 130/90   Pulse 84   Temp 97.7 F (36.5 C) (Temporal)   Resp 15   Ht 5\' 4"  (1.626 m)   Wt 215 lb (97.5 kg)   SpO2 97%   Breastfeeding Yes   BMI 36.90 kg/m   Patient alert and oriented and in no cardiopulmonary distress.  HEENT: No facial asymmetry, EOMI,   oropharynx pink and moist.  Neck supple no JVD, no mass.  Chest: Clear to auscultation bilaterally.  CVS: S1, S2 no murmurs, no S3.Regular rate.  ABD: Soft non tender.   Ext: No edema  MS: Adequate ROM spine, shoulders, hips and knees.  Skin: Intact, no ulcerations or rash noted.  Psych: Good eye contact, normal affect. Memory intact not anxious or depressed appearing.  CNS: CN 2-12 intact, power,  normal throughout.no focal deficits noted.   Assessment & Plan  Gestational hypertension DASH diet and commitment to daily physical activity for a minimum of 30 minutes discussed and encouraged, as a part of hypertension management. The importance of attaining a healthy weight is also discussed.  BP/Weight 07/10/2019  07/04/2019 06/10/2019 06/07/2019 06/04/2019 06/02/2019 Q000111Q  Systolic BP [id number] Q000111Q [id number] Q000111Q 0000000 [id number] -  Diastolic BP 94 84 94 73 [id number] 76 -  Wt. (Lbs) 215 - - - - - 245  BMI 36.9 42.05 - - - - -   Uncontrolled, add amlodipine 2.5 mg daily, re eval in 5 to 6 weeks    Headache Headache associated with uncontrolled BP, improving, and some relief with tylenol. No neurologic deficits Pt to  Use tylenol for pain, as bP normalizes, anxiety associated with headache will improve

## 2019-07-10 NOTE — Assessment & Plan Note (Signed)
Headache associated with uncontrolled BP, improving, and some relief with tylenol. No neurologic deficits Pt to  Use tylenol for pain, as bP normalizes, anxiety associated with headache will improve

## 2019-07-10 NOTE — Patient Instructions (Signed)
F/U in office with MD for repeat blood pressure check in 4 to 5 weeks, call if you need me sooner  Flu vaccine in office today  Starting tomorrow, take amlodipine 10 mg tablet at 8 am , and new additional amlodipine 2.5 mg tablet at 8 pm  Increase vegetable , fruit and water, decrease salt and processed food to help blood pressure  Congrats and best wishes on 2 lovely girls and your family together!  Thanks for choosing [facility], we consider it a privelige to serve you.

## 2019-07-11 DIAGNOSIS — Z029 Encounter for administrative examinations, unspecified: Secondary | ICD-10-CM

## 2019-07-17 ENCOUNTER — Telehealth: Payer: Self-pay | Admitting: Family Medicine

## 2019-07-17 NOTE — Telephone Encounter (Signed)
FMLA- Restriction forms Copied Noted sleeved

## 2019-07-22 DIAGNOSIS — I10 Essential (primary) hypertension: Secondary | ICD-10-CM

## 2019-08-15 ENCOUNTER — Telehealth: Payer: Self-pay

## 2019-08-15 NOTE — Telephone Encounter (Signed)
Patients mother called in stating patient's head was hurting badly. She had taken her bp medication and 2 ES Tylenol. She had taken her bp and it was 151/101 with home cuff. Maybe had some discoloration around eyes. Advised mother and patient to go to closest urgent care to be seen since there were no available slots for today with verbal understanding.

## 2019-08-20 ENCOUNTER — Other Ambulatory Visit: Payer: Self-pay

## 2019-08-20 ENCOUNTER — Encounter: Payer: Self-pay | Admitting: Family Medicine

## 2019-08-20 ENCOUNTER — Ambulatory Visit (INDEPENDENT_AMBULATORY_CARE_PROVIDER_SITE_OTHER): Payer: 59 | Admitting: Family Medicine

## 2019-08-20 VITALS — BP 120/90 | HR 92 | Resp 15 | Ht 64.0 in | Wt 221.0 lb

## 2019-08-20 DIAGNOSIS — L608 Other nail disorders: Secondary | ICD-10-CM

## 2019-08-20 DIAGNOSIS — E669 Obesity, unspecified: Secondary | ICD-10-CM

## 2019-08-20 DIAGNOSIS — D539 Nutritional anemia, unspecified: Secondary | ICD-10-CM

## 2019-08-20 DIAGNOSIS — I10 Essential (primary) hypertension: Secondary | ICD-10-CM | POA: Diagnosis not present

## 2019-08-20 DIAGNOSIS — O1495 Unspecified pre-eclampsia, complicating the puerperium: Secondary | ICD-10-CM

## 2019-08-20 NOTE — Patient Instructions (Signed)
F/U in in 3 months, call if you need me sooner  Labs today, cmp and EGFr, TSH cBC, irion and ferritin   You are referred to Dermatology for discoloration of fingernails since recent delivery 4 months ago  It is important that you exercise regularly at least 30 minutes 5 times a week. If you develop chest pain, have severe difficulty breathing, or feel very tired, stop exercising immediately and seek medical attention

## 2019-08-21 ENCOUNTER — Encounter: Payer: Self-pay | Admitting: Family Medicine

## 2019-08-21 LAB — COMPLETE METABOLIC PANEL WITH GFR
AG Ratio: 1.4 (calc) (ref 1.0–2.5)
ALT: 9 U/L (ref 6–29)
AST: 17 U/L (ref 10–30)
Albumin: 4 g/dL (ref 3.6–5.1)
Alkaline phosphatase (APISO): 101 U/L (ref 31–125)
BUN: 11 mg/dL (ref 7–25)
CO2: 29 mmol/L (ref 20–32)
Calcium: 8.9 mg/dL (ref 8.6–10.2)
Chloride: 105 mmol/L (ref 98–110)
Creat: 0.68 mg/dL (ref 0.50–1.10)
GFR, Est African American: 133 mL/min/{1.73_m2} (ref >=60)
GFR, Est Non African American: 115 mL/min/{1.73_m2} (ref >=60)
Globulin: 2.9 g/dL (calc) (ref 1.9–3.7)
Glucose, Bld: 88 mg/dL (ref 65–139)
Potassium: 3.8 mmol/L (ref 3.5–5.3)
Sodium: 140 mmol/L (ref 135–146)
Total Bilirubin: 0.2 mg/dL (ref 0.2–1.2)
Total Protein: 6.9 g/dL (ref 6.1–8.1)

## 2019-08-21 LAB — CBC
HCT: 38.1 % (ref 35.0–45.0)
Hemoglobin: 12.3 g/dL (ref 11.7–15.5)
MCH: 26.3 pg — ABNORMAL LOW (ref 27.0–33.0)
MCHC: 32.3 g/dL (ref 32.0–36.0)
MCV: 81.4 fL (ref 80.0–100.0)
MPV: 10 fL (ref 7.5–12.5)
Platelets: 364 10*3/uL (ref 140–400)
RBC: 4.68 10*6/uL (ref 3.80–5.10)
RDW: 13.4 % (ref 11.0–15.0)
WBC: 6.2 10*3/uL (ref 3.8–10.8)

## 2019-08-21 LAB — TSH: TSH: 0.77 mIU/L

## 2019-08-21 LAB — FERRITIN: Ferritin: 27 ng/mL (ref 16–154)

## 2019-08-21 LAB — IRON: Iron: 39 ug/dL — ABNORMAL LOW (ref 40–190)

## 2019-08-22 ENCOUNTER — Encounter: Payer: Self-pay | Admitting: Advanced Practice Midwife

## 2019-08-23 ENCOUNTER — Other Ambulatory Visit: Payer: Self-pay | Admitting: Obstetrics & Gynecology

## 2019-08-24 ENCOUNTER — Encounter: Payer: Self-pay | Admitting: Family Medicine

## 2019-08-24 DIAGNOSIS — L608 Other nail disorders: Secondary | ICD-10-CM | POA: Insufficient documentation

## 2019-08-24 DIAGNOSIS — E669 Obesity, unspecified: Secondary | ICD-10-CM | POA: Insufficient documentation

## 2019-08-24 NOTE — Progress Notes (Signed)
   Janet [name]     MRN: [id number]      DOB: 28-Jul-1986   HPI [name] is here for follow up and re-evaluation of chronic medical conditions, in particular hypertension.There are no specific complaints . She denies adverse s/e from her medication, and states she actually feels better now that her pressure is controlled. Does report recent episode about 2 days ago when she had a headache and noted at that time that her blood pressure was elevated above what it has been running , which generally has been falling in a goal range. C/o discoloration of her fingernails which has progressively worsened  Over the past 4 months, since delivery of her infant. Toenails are not similarly affected   ROS Denies recent fever or chills. Denies sinus pressure, nasal congestion, ear pain or sore throat. Denies chest congestion, productive cough or wheezing. Denies chest pains, palpitations and leg swelling Denies abdominal pain, nausea, vomiting,diarrhea or constipation.   Denies dysuria, frequency, hesitancy or incontinence. Denies joint pain, swelling and limitation in mobility. Denies headaches, seizures, numbness, or tingling. Denies depression, anxiety or insomnia.  PE  BP 120/90   Pulse 92   Resp 15   Ht 5\' 4"  (1.626 m)   Wt 221 lb 0.6 oz (100.3 kg)   SpO2 97%   BMI 37.94 kg/m   Patient alert and oriented and in no cardiopulmonary distress.  HEENT: No facial asymmetry, EOMI,     Neck supple .  Chest: Clear to auscultation bilaterally.  CVS: S1, S2 no murmurs, no S3.Regular rate.  .   Ext: No edema  MS: Adequate ROM spine, shoulders, hips and knees.  Skin: Intact, no ulcerations or rash noted.Hyperpigmented of all 10 fingernails noted, nails are not thickened or crumbling  Psych: Good eye contact, normal affect. Memory intact not anxious or depressed appearing.  CNS: CN 2-12 intact, power,  normal throughout.no focal deficits noted.   Assessment & Plan  Essential  hypertension Systolic at goal, diastolic elevated DASH diet and commitment to daily physical activity for a minimum of 30 minutes discussed and encouraged, as a part of hypertension management. The importance of attaining a healthy weight is also discussed.  BP/Weight 08/20/2019 07/10/2019 07/04/2019 06/10/2019 06/07/2019 06/04/2019 [id number]  Systolic BP 123456 [id number] Q000111Q [id number] Q000111Q 0000000 [id number]  Diastolic BP 90 94 84 94 73 100 76  Wt. (Lbs) 221.04 215 - - - - -  BMI 37.94 36.9 42.05 - - - -     No medication change, stressed the importance of low sodium diet  Nail discoloration Fingernail hyperpigmentation x 4 months, started postpartum and has persisted and worsened. Toenails no affected, refer to Dermatology for evaluation. No fungal infection noted. Nail beds are raised in places  Obesity (BMI 30-39.9)  Patient re-educated about  the importance of commitment to a  minimum of 150 minutes of exercise per week as able.  The importance of healthy food choices with portion control discussed, as well as eating regularly and within a 12 hour window most days. The need to choose "clean , green" food 50 to 75% of the time is discussed, as well as to make water the primary drink and set a goal of 64 ounces water daily.    Weight /BMI 08/20/2019 07/10/2019 07/04/2019  WEIGHT 221 lb 0.6 oz 215 lb -  HEIGHT 5\' 4"  5\' 4"  5\' 4"   BMI 37.94 kg/m2 36.9 kg/m2 42.05 kg/m2

## 2019-08-24 NOTE — Assessment & Plan Note (Signed)
Systolic at goal, diastolic elevated DASH diet and commitment to daily physical activity for a minimum of 30 minutes discussed and encouraged, as a part of hypertension management. The importance of attaining a healthy weight is also discussed.  BP/Weight 08/20/2019 07/10/2019 07/04/2019 06/10/2019 06/07/2019 06/04/2019 [id number]  Systolic BP 123456 [id number] Q000111Q [id number] Q000111Q 0000000 [id number]  Diastolic BP 90 94 84 94 73 100 76  Wt. (Lbs) 221.04 215 - - - - -  BMI 37.94 36.9 42.05 - - - -     No medication change, stressed the importance of low sodium diet

## 2019-08-24 NOTE — Assessment & Plan Note (Signed)
Fingernail hyperpigmentation x 4 months, started postpartum and has persisted and worsened. Toenails no affected, refer to Dermatology for evaluation. No fungal infection noted. Nail beds are raised in places

## 2019-08-24 NOTE — Assessment & Plan Note (Signed)
  Patient re-educated about  the importance of commitment to a  minimum of 150 minutes of exercise per week as able.  The importance of healthy food choices with portion control discussed, as well as eating regularly and within a 12 hour window most days. The need to choose "clean , green" food 50 to 75% of the time is discussed, as well as to make water the primary drink and set a goal of 64 ounces water daily.    Weight /BMI 08/20/2019 07/10/2019 07/04/2019  WEIGHT 221 lb 0.6 oz 215 lb -  HEIGHT 5\' 4"  5\' 4"  5\' 4"   BMI 37.94 kg/m2 36.9 kg/m2 42.05 kg/m2

## 2019-09-20 ENCOUNTER — Other Ambulatory Visit: Payer: Self-pay

## 2019-09-20 ENCOUNTER — Other Ambulatory Visit (HOSPITAL_COMMUNITY)
Admission: RE | Admit: 2019-09-20 | Discharge: 2019-09-20 | Disposition: A | Discharge status: Home / Self Care | Payer: 59 | Source: Ambulatory Visit | Attending: Urology | Admitting: Urology

## 2019-09-20 ENCOUNTER — Ambulatory Visit: Payer: 59 | Admitting: Urology

## 2019-09-20 DIAGNOSIS — R3121 Asymptomatic microscopic hematuria: Secondary | ICD-10-CM | POA: Insufficient documentation

## 2019-09-20 LAB — URINALYSIS, COMPLETE (UACMP) WITH MICROSCOPIC
Bilirubin Urine: NEGATIVE
Glucose, UA: NEGATIVE mg/dL
Ketones, ur: NEGATIVE mg/dL
Leukocytes,Ua: NEGATIVE
Nitrite: NEGATIVE
Protein, ur: 30 mg/dL — AB
Specific Gravity, Urine: 1.024 (ref 1.005–1.030)
pH: 6 (ref 5.0–8.0)

## 2019-09-24 ENCOUNTER — Telehealth: Payer: Self-pay

## 2019-09-24 ENCOUNTER — Other Ambulatory Visit: Payer: Self-pay | Admitting: Urology

## 2019-09-24 ENCOUNTER — Other Ambulatory Visit (HOSPITAL_COMMUNITY): Payer: Self-pay | Admitting: Urology

## 2019-09-24 DIAGNOSIS — R3121 Asymptomatic microscopic hematuria: Secondary | ICD-10-CM

## 2019-09-24 NOTE — Telephone Encounter (Signed)
Pt. Called wanting results of urine tests.

## 2019-09-27 NOTE — Telephone Encounter (Signed)
Completed in [place].

## 2019-10-07 ENCOUNTER — Ambulatory Visit (HOSPITAL_COMMUNITY)
Admission: RE | Admit: 2019-10-07 | Discharge: 2019-10-07 | Disposition: A | Discharge status: Home / Self Care | Payer: 59 | Source: Ambulatory Visit | Attending: Urology | Admitting: Urology

## 2019-10-07 ENCOUNTER — Other Ambulatory Visit: Payer: Self-pay

## 2019-10-07 ENCOUNTER — Encounter: Payer: Self-pay | Admitting: Urology

## 2019-10-07 DIAGNOSIS — R3121 Asymptomatic microscopic hematuria: Secondary | ICD-10-CM | POA: Diagnosis present

## 2019-10-07 LAB — POCT I-STAT CREATININE: Creatinine, Ser: 0.8 mg/dL (ref 0.44–1.00)

## 2019-10-07 MED ORDER — IOHEXOL 300 MG/ML  SOLN
150.0000 mL | Freq: Once | INTRAMUSCULAR | Status: AC | PRN
  Administered 2019-10-07: 150 mL via INTRAVENOUS

## 2019-10-30 ENCOUNTER — Other Ambulatory Visit: Payer: Self-pay | Admitting: Adult Health

## 2019-11-07 ENCOUNTER — Other Ambulatory Visit: Payer: Self-pay

## 2019-11-08 ENCOUNTER — Encounter: Payer: Self-pay | Admitting: Urology

## 2019-11-08 ENCOUNTER — Other Ambulatory Visit (HOSPITAL_COMMUNITY)
Admission: AD | Admit: 2019-11-08 | Discharge: 2019-11-08 | Disposition: A | Discharge status: Home / Self Care | Payer: 59 | Source: Other Acute Inpatient Hospital | Attending: Urology | Admitting: Urology

## 2019-11-08 ENCOUNTER — Ambulatory Visit (INDEPENDENT_AMBULATORY_CARE_PROVIDER_SITE_OTHER): Payer: 59 | Admitting: Urology

## 2019-11-08 ENCOUNTER — Other Ambulatory Visit: Payer: Self-pay

## 2019-11-08 VITALS — BP 145/81 | HR 72 | Temp 97.3°F | Ht 64.0 in | Wt 212.0 lb

## 2019-11-08 DIAGNOSIS — R3129 Other microscopic hematuria: Secondary | ICD-10-CM | POA: Insufficient documentation

## 2019-11-08 DIAGNOSIS — R801 Persistent proteinuria, unspecified: Secondary | ICD-10-CM | POA: Diagnosis not present

## 2019-11-08 DIAGNOSIS — N281 Cyst of kidney, acquired: Secondary | ICD-10-CM

## 2019-11-08 LAB — URINALYSIS, ROUTINE W REFLEX MICROSCOPIC
Bacteria, UA: NONE SEEN
Bilirubin Urine: NEGATIVE
Glucose, UA: NEGATIVE mg/dL
Ketones, ur: NEGATIVE mg/dL
Leukocytes,Ua: NEGATIVE
Nitrite: NEGATIVE
Protein, ur: NEGATIVE mg/dL
Specific Gravity, Urine: 1.015 (ref 1.005–1.030)
pH: 7 (ref 5.0–8.0)

## 2019-11-08 LAB — POCT URINALYSIS DIPSTICK
Bilirubin, UA: NEGATIVE
Glucose, UA: NEGATIVE
Ketones, UA: NEGATIVE
Leukocytes, UA: NEGATIVE
Nitrite, UA: NEGATIVE
Protein, UA: POSITIVE — AB
Spec Grav, UA: 1.025 (ref 1.010–1.025)
Urobilinogen, UA: NEGATIVE E.U./dL — AB
pH, UA: 7 (ref 5.0–8.0)

## 2019-11-08 NOTE — Progress Notes (Signed)
Subjective:  1. Microscopic hematuria   2. Persistent proteinuria      I have blood in my urine.  Janet [name] is a 34 yo female who is sent by [facility] OB for microhematuria seen on several UA's. I see 2 with small blood but she also apparently had a positive micro UA. Her UA today has 3+ blood and some protein. 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 She has had rare UTI's. She has had no stones or UTI's. She has not had any upper tract imaging. She has not had recent labs. She has family history of hematuria in her mother. There is no history of sickle cell disease in the family. she has no voiding complaints.   CT IMPRESSION: 1. No definite source for microscopic hematuria. Specifically, no urinary tract calculi no findings of urinary tract obstruction. 2. Subcentimeter low-attenuation lesion in the interpolar region of the left kidney, too small to characterize, but statistically likely to represent a tiny cyst. Should the patient's hematuria persist or worsen, further evaluation with abdominal MRI with and without IV gadolinium could be considered for definitive characterization. 3. Diffuse fatty infiltration of the pancreas without associated atrophy. This is of uncertain etiology and significance.  I have reviewed the films and report and discussed the findings with Janet [name].   ROS:  ROS:  A complete review of systems was performed.  All systems are negative except for pertinent findings as noted.   ROS  Allergies  Allergen Reactions  . Methylprednisolone Shortness Of Breath, Itching and Other (See Comments)    Reaction:  Bruising   . Penicillins Anaphylaxis, Rash and Other (See Comments)    Has patient had a PCN reaction causing immediate rash, facial/tongue/throat swelling, SOB or lightheadedness with hypotension: No Has patient had a PCN reaction causing severe rash  involving mucus membranes or skin necrosis: No Has patient had a PCN reaction that required hospitalization No Has patient had a PCN reaction occurring within the last 10 years: No If all of the above answers are "NO", then may proceed with Cephalosporin use.  . Latex Rash  . Neomycin Rash    Breaks out with neosporin    Outpatient Encounter Medications as of 11/08/2019  Medication Sig  . acetaminophen (TYLENOL) 325 MG tablet Take 2 tablets (650 mg total) by mouth every 4 (four) hours as needed (for pain scale < 4).  Marland Kitchen amLODipine (NORVASC) 10 MG tablet TAKE 1 TABLET BY MOUTH EVERY DAY  . amLODipine (NORVASC) 2.5 MG tablet Take one tablet every day at 12 mid day, along with amlodipine 10 mg tablet  . Iron, Ferrous Sulfate, 325 (65 Fe) MG TABS Take 1 tablet by mouth every other day.  . Prenatal 27-1 MG TABS TAKE 1 TABLET BY MOUTH DAILY AT 12 NOON  . VITAMIN D PO Take 50,000 Units by mouth once a week.   No facility-administered encounter medications on file as of 11/08/2019.    Past Medical History:  Diagnosis Date  . 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 Carpal tunnel syndrome during pregnancy   . Chiari malformation type I ([place]) 2015   on MRI  . Cushing syndrome ([place]) 2011   r/t adrenal tumor; tumor removed, disease resolved  . Cushing's syndrome ([place]) 11/27/2013  . Gastroesophageal reflux disease   . Hematuria 09/18/2015  . Menstrual periods irregular 09/18/2015  . Migraine  without aura, with intractable migraine, so stated, without mention of status migrainosus 11/11/2013  . Patient desires pregnancy 11/27/2013  . Pregnant 12/16/2015  . Vitamin D deficiency     Past Surgical History:  Procedure Laterality Date  . CHOLECYSTECTOMY  02/2015  . COLONOSCOPY N/A 04/19/2013   [id number] mucosa in the terminal ileum/Single erosion in transverse colon/RECTAL BLEEDING DUE TO Moderate sized internal hemorrhoids/ trv colon erosion, bx benign.  .  ESOPHAGOGASTRODUODENOSCOPY N/A 04/19/2013   [id number] Non-erosive gastritis/PERI-UMBILICAL PAIN DUE TO GERD, GASTRITIS, AND CONSTIPATION. Bx with mild chronic inactive gastritis. No H.Pylori  . HEMORRHOID BANDING  2015   Dr. Gala Romney- in office banding procedure  . TONSILLECTOMY    . tumor removed left adrenal gland 01/12      Social History   Socioeconomic History  . Marital status: Significant Other    Spouse name: Not on file  . Number of children: 1  . Years of education: BA  . Highest education level: Not on file  Occupational History    Employer: [employer]  Tobacco Use  . Smoking status: Never Smoker  . Smokeless tobacco: Never Used  Substance and Sexual Activity  . Alcohol use: No  . Drug use: No  . Sexual activity: Not Currently    Birth control/protection: None  Other Topics Concern  . Not on file  Social History Narrative   Denies caffeine use    Social Determinants of Health   Financial Resource Strain:   . Difficulty of Paying Living Expenses: Not on file  Food Insecurity:   . Worried About Charity fundraiser in the Last Year: Not on file  . Ran Out of Food in the Last Year: Not on file  Transportation Needs:   . Lack of Transportation (Medical): Not on file  . Lack of Transportation (Non-Medical): Not on file  Physical Activity:   . Days of Exercise per Week: Not on file  . Minutes of Exercise per Session: Not on file  Stress:   . Feeling of Stress : Not on file  Social Connections:   . Frequency of Communication with Friends and Family: Not on file  . Frequency of Social Gatherings with Friends and Family: Not on file  . Attends Religious Services: Not on file  . Active Member of Clubs or Organizations: Not on file  . Attends Archivist Meetings: Not on file  . Marital Status: Not on file  Intimate Partner Violence:   . Fear of Current or Ex-Partner: Not on file  . Emotionally Abused: Not on file  . Physically Abused: Not on file  . Sexually  Abused: Not on file    Family History  Problem Relation Age of Onset  . Hypertension Mother   . Hyperlipidemia Mother   . Hypertension Father   . Hypertension Maternal Grandmother   . Diabetes Maternal Grandmother   . Deep vein thrombosis Maternal Grandmother   . Diabetes Paternal Grandmother   . COPD Paternal Grandfather   . Heart disease Paternal Grandfather   . Colon cancer Other        maternal great uncle  . Stroke Maternal Aunt   . Migraines Maternal Aunt   . Deep vein thrombosis Sister   . Other Daughter        pulmonary valve stenosis       Objective: Vitals:   11/08/19 1557  BP: (!) 145/81  Pulse: 72  Temp: (!) 97.3 F (36.3 C)     Physical Exam  Lab Results:  Results for orders placed or performed in visit on 11/08/19 (from the past 24 hour(s))  POCT urinalysis dipstick     Status: Abnormal   Collection Time: 11/08/19  4:08 PM  Result Value Ref Range   Color, UA yellow    Clarity, UA clear    Glucose, UA Negative Negative   Bilirubin, UA neg    Ketones, UA neg    Spec Grav, UA 1.025 1.010 - 1.025   Blood, UA large    pH, UA 7.0 5.0 - 8.0   Protein, UA Positive (A) Negative   Urobilinogen, UA negative (A) 0.2 or 1.0 E.U./dL   Nitrite, UA neg    Leukocytes, UA Negative Negative   Appearance     Odor      BMET No results for input(s): NA, K, CL, CO2, GLUCOSE, BUN, CREATININE, CALCIUM in the last 72 hours. PSA No results found for: PSA No results found for: TESTOSTERONE  Cystoscopy was done after a betadine prep.   Urethra: normal. Bladder: no mucosal lesions or stones. Ureteral orifices: normal location and configuration.  Complications: none.   Studies/Results: No results found.    Assessment & Plan: Microhematuria with proteinuria.   GU w/u is negative apart from a possible small left renal cyst but it is indeterminate on CT.   I will send the UA for micro and refer her to nephrology for further evaluation.   I will have her return  to see me in 1 year.   Renal lesion is indeterminate so if the hematuria persists at f/u an MRI would be worthwhile.      No orders of the defined types were placed in this encounter.    Orders Placed This Encounter  Procedures  . Urinalysis, Routine w reflex microscopic    Standing Status:   Future    Standing Expiration Date:   12/08/2019  . Ambulatory referral to Nephrology    Referral Priority:   Routine    Referral Type:   Consultation    Referral Reason:   Specialty Services Required    Requested Specialty:   Nephrology    Number of Visits Requested:   1  . POCT urinalysis dipstick      Return in about 1 year (around 11/07/2020) for UA recheck..   CC: [name], MD      Irine Seal 11/08/2019

## 2019-11-15 ENCOUNTER — Telehealth: Payer: Self-pay

## 2019-11-15 DIAGNOSIS — D539 Nutritional anemia, unspecified: Secondary | ICD-10-CM

## 2019-11-15 DIAGNOSIS — E559 Vitamin D deficiency, unspecified: Secondary | ICD-10-CM

## 2019-11-15 NOTE — Telephone Encounter (Signed)
I had to push appt out to 2-23 cause she wanted to stay with you.  However pt has a few questions  Do you want her to continue to take the Iron.  Can you prescribe her Vit D

## 2019-11-18 NOTE — Telephone Encounter (Signed)
Labs ordered- will call pt and let her know

## 2019-11-18 NOTE — Telephone Encounter (Signed)
Please advise 

## 2019-11-18 NOTE — Telephone Encounter (Signed)
Pt aware.

## 2019-11-18 NOTE — Telephone Encounter (Signed)
Needs updated Vit D and iron levels before I can answer that question may get both today/ non fast

## 2019-11-21 ENCOUNTER — Ambulatory Visit: Payer: 59 | Admitting: Family Medicine

## 2019-12-03 ENCOUNTER — Ambulatory Visit: Payer: 59 | Admitting: Family Medicine

## 2019-12-11 ENCOUNTER — Ambulatory Visit: Payer: 59 | Admitting: Family Medicine

## 2019-12-18 ENCOUNTER — Ambulatory Visit: Payer: 59 | Admitting: Family Medicine

## 2020-03-11 ENCOUNTER — Telehealth: Payer: Self-pay

## 2020-03-11 DIAGNOSIS — D509 Iron deficiency anemia, unspecified: Secondary | ICD-10-CM

## 2020-03-11 DIAGNOSIS — I10 Essential (primary) hypertension: Secondary | ICD-10-CM

## 2020-03-11 DIAGNOSIS — Z13220 Encounter for screening for lipoid disorders: Secondary | ICD-10-CM

## 2020-03-11 DIAGNOSIS — E559 Vitamin D deficiency, unspecified: Secondary | ICD-10-CM

## 2020-03-11 NOTE — Telephone Encounter (Signed)
Which labs to order?

## 2020-03-11 NOTE — Telephone Encounter (Signed)
Please send orders for fast lab work to check everything Dr Moshe Cipro normally checks. Will go to lab on Thursday -[facility]   And plans on Fasting

## 2020-03-12 NOTE — Telephone Encounter (Signed)
Labs ordered.

## 2020-03-12 NOTE — Telephone Encounter (Signed)
CBC, iron, vit D, fasting lipid, cmp and EgFR, TSH

## 2020-03-12 NOTE — Addendum Note (Signed)
Addended by: Eual Fines on: 03/12/2020 03:48 PM   Modules accepted: Orders

## 2020-03-19 ENCOUNTER — Encounter: Payer: Self-pay | Admitting: Family Medicine

## 2020-03-19 ENCOUNTER — Ambulatory Visit (INDEPENDENT_AMBULATORY_CARE_PROVIDER_SITE_OTHER): Payer: 59 | Admitting: Family Medicine

## 2020-03-19 ENCOUNTER — Other Ambulatory Visit: Payer: Self-pay

## 2020-03-19 VITALS — BP 123/86 | HR 80 | Temp 98.9°F | Resp 15 | Ht 64.0 in | Wt 230.0 lb

## 2020-03-19 DIAGNOSIS — Z0000 Encounter for general adult medical examination without abnormal findings: Secondary | ICD-10-CM | POA: Diagnosis not present

## 2020-03-19 DIAGNOSIS — D539 Nutritional anemia, unspecified: Secondary | ICD-10-CM

## 2020-03-19 DIAGNOSIS — I10 Essential (primary) hypertension: Secondary | ICD-10-CM

## 2020-03-19 DIAGNOSIS — Z1159 Encounter for screening for other viral diseases: Secondary | ICD-10-CM

## 2020-03-19 DIAGNOSIS — E559 Vitamin D deficiency, unspecified: Secondary | ICD-10-CM | POA: Diagnosis not present

## 2020-03-19 DIAGNOSIS — Z13220 Encounter for screening for lipoid disorders: Secondary | ICD-10-CM

## 2020-03-19 NOTE — Progress Notes (Signed)
    Janet [name]     MRN: [id number]      DOB: 06-27-1986  HPI: Patient is in for annual physical exam.No pelvic or pap  . Immunization is reviewed , and  updated .   PE: BP 123/86   Pulse 80   Temp 98.9 F (37.2 C) (Temporal)   Resp 15   Ht 5\' 4"  (1.626 m)   Wt 230 lb 0.6 oz (104.3 kg)   SpO2 100%   BMI 39.49 kg/m   Pleasant  female, alert and oriented x 3, in no cardio-pulmonary distress. Afebrile. HEENT No facial trauma or asymetry.  Extra occullar muscles intact.. External ears normal, . Neck: supple, no adenopathy,JVD or thyromegaly.No bruits.  Chest: Clear to ascultation bilaterally.No crackles or wheezes. Non tender to palpation  Cardiovascular system; Heart sounds normal,  S1 and  S2 ,no S3.  No murmur, or thrill. Apical beat not displaced Peripheral pulses normal.  Abdomen: Soft, non tender, .      Musculoskeletal exam: Full ROM of spine, hips , shoulders and knees. No deformity ,swelling or crepitus noted. No muscle wasting or atrophy.   Neurologic: Cranial nerves 2 to 12 intact. Power, tone ,sensation and reflexes normal throughout. No disturbance in gait. No tremor.  Skin: Intact, no ulceration, erythema , scaling or rash noted. Pigmentation normal throughout  Psych; Normal mood and affect. Judgement and concentration normal   Assessment & Plan:  Encounter for annual physical exam Annual exam as documented. Counseling done  re healthy lifestyle involving commitment to 150 minutes exercise per week, heart healthy diet, and attaining healthy weight.The importance of adequate sleep also discussed.

## 2020-03-19 NOTE — Patient Instructions (Addendum)
F/U in office with MD in mid to end September  Please get Hep C, CBC, fasting lipid, cmp and EGFr, TSH , Vit D and iron [facility])  It is important that you exercise regularly at least 30 minutes 5 times a week. If you develop chest pain, have severe difficulty breathing, or feel very tired, stop exercising immediately and seek medical attention   Think about what you will eat, plan ahead. Choose " clean, green, fresh or frozen" over canned, processed or packaged foods which are more sugary, salty and fatty. 70 to 75% of food eaten should be vegetables and fruit. Three meals at set times with snacks allowed between meals, but they must be fruit or vegetables. Aim to eat over a 12 hour period , example 7 am to 7 pm, and STOP after  your last meal of the day. Drink water,generally about 64 ounces per day, no other drink is as healthy. Fruit juice is best enjoyed in a healthy way, by EATING the fruit. Thanks for choosing [facility], we consider it a privelige to serve you.

## 2020-03-20 ENCOUNTER — Encounter: Payer: Self-pay | Admitting: Family Medicine

## 2020-03-20 NOTE — Assessment & Plan Note (Signed)
Annual exam as documented. Counseling done  re healthy lifestyle involving commitment to 150 minutes exercise per week, heart healthy diet, and attaining healthy weight.The importance of adequate sleep also discussed.  

## 2020-03-23 LAB — COMPLETE METABOLIC PANEL WITH GFR
AG Ratio: 1.4 (calc) (ref 1.0–2.5)
ALT: 9 U/L (ref 6–29)
AST: 14 U/L (ref 10–30)
Albumin: 3.9 g/dL (ref 3.6–5.1)
Alkaline phosphatase (APISO): 88 U/L (ref 31–125)
BUN: 13 mg/dL (ref 7–25)
CO2: 27 mmol/L (ref 20–32)
Calcium: 8.8 mg/dL (ref 8.6–10.2)
Chloride: 104 mmol/L (ref 98–110)
Creat: 0.71 mg/dL (ref 0.50–1.10)
GFR, Est African American: 129 mL/min/{1.73_m2} (ref >=60)
GFR, Est Non African American: 111 mL/min/{1.73_m2} (ref >=60)
Globulin: 2.8 g/dL (calc) (ref 1.9–3.7)
Glucose, Bld: 97 mg/dL (ref 65–99)
Potassium: 4.3 mmol/L (ref 3.5–5.3)
Sodium: 137 mmol/L (ref 135–146)
Total Bilirubin: 0.3 mg/dL (ref 0.2–1.2)
Total Protein: 6.7 g/dL (ref 6.1–8.1)

## 2020-03-23 LAB — CBC
HCT: 37.4 % (ref 35.0–45.0)
Hemoglobin: 12.4 g/dL (ref 11.7–15.5)
MCH: 27.4 pg (ref 27.0–33.0)
MCHC: 33.2 g/dL (ref 32.0–36.0)
MCV: 82.7 fL (ref 80.0–100.0)
MPV: 9.6 fL (ref 7.5–12.5)
Platelets: 364 10*3/uL (ref 140–400)
RBC: 4.52 10*6/uL (ref 3.80–5.10)
RDW: 13.1 % (ref 11.0–15.0)
WBC: 6.4 10*3/uL (ref 3.8–10.8)

## 2020-03-23 LAB — LIPID PANEL
Cholesterol: 167 mg/dL (ref <200)
HDL: 54 mg/dL (ref >=50)
LDL Cholesterol (Calc): 99 mg/dL (calc)
Non-HDL Cholesterol (Calc): 113 mg/dL (calc) (ref <130)
Total CHOL/HDL Ratio: 3.1 (calc) (ref <5.0)
Triglycerides: 55 mg/dL (ref <150)

## 2020-03-23 LAB — HEPATITIS C ANTIBODY
Hepatitis C Ab: NONREACTIVE
SIGNAL TO CUT-OFF: 0.01 (ref <1.00)

## 2020-03-23 LAB — VITAMIN D 25 HYDROXY (VIT D DEFICIENCY, FRACTURES): Vit D, 25-Hydroxy: 16 ng/mL — ABNORMAL LOW (ref 30–100)

## 2020-03-23 LAB — TSH: TSH: 1.7 mIU/L

## 2020-03-23 LAB — IRON: Iron: 38 ug/dL — ABNORMAL LOW (ref 40–190)

## 2020-03-23 NOTE — Progress Notes (Signed)
See mychart comment

## 2020-04-01 ENCOUNTER — Encounter: Payer: Self-pay | Admitting: Family Medicine

## 2020-04-02 ENCOUNTER — Encounter: Payer: Self-pay | Admitting: Family Medicine

## 2020-04-02 ENCOUNTER — Telehealth (HOSPITAL_COMMUNITY): Payer: Self-pay

## 2020-04-02 NOTE — Telephone Encounter (Signed)
Received call from mom, states referred by pediatrician to determine if ok to take Colace while breastfeeding. Advised mom Colace ok to take as directed. Mom reports noting a drop in milk supply with return of menses on 03/22/20, obtains 1oz from the pump, normally collects 3-4oz each pump session. Mom would like to know how to increase supply, has tried oatmeal, increasing water, carrots, and fenugreek. Advised power pumping 4d-1week, reviewed detailed power pump schedule with mom. Discussed how constipation and gut health r/t lactation. Mom voiced understanding and with no further concerns. Advised to return call as needed. BGilliam, RN, IBCLC

## 2020-04-03 ENCOUNTER — Other Ambulatory Visit: Payer: Self-pay | Admitting: Women's Health

## 2020-04-03 MED ORDER — MEGESTROL ACETATE 40 MG PO TABS: ORAL_TABLET | ORAL | 1 refills | Status: DC

## 2020-04-07 ENCOUNTER — Other Ambulatory Visit: Payer: Self-pay

## 2020-04-07 ENCOUNTER — Encounter: Payer: Self-pay | Admitting: Adult Health

## 2020-04-07 ENCOUNTER — Ambulatory Visit: Payer: 59 | Admitting: Adult Health

## 2020-04-07 VITALS — BP 139/93 | HR 76 | Ht 64.0 in | Wt 231.0 lb

## 2020-04-07 DIAGNOSIS — Z3202 Encounter for pregnancy test, result negative: Secondary | ICD-10-CM

## 2020-04-07 DIAGNOSIS — N939 Abnormal uterine and vaginal bleeding, unspecified: Secondary | ICD-10-CM | POA: Diagnosis not present

## 2020-04-07 LAB — POCT HEMOGLOBIN: Hemoglobin: 13.3 g/dL (ref 11–14.6)

## 2020-04-07 LAB — POCT URINE PREGNANCY: Preg Test, Ur: NEGATIVE

## 2020-04-07 NOTE — Progress Notes (Addendum)
  Subjective:     Patient ID: Janet [name], female   DOB: May 30, 1986, 34 y.o.   MRN: [id number]  HPI Janet [name] is a 34 year old black female, single, G2P2 in complaining of bleeding since 03/12/20, heavy at times. She had text Maudie Mercury and had megace rx sent, but she has not started it. PCP is Dr Moshe Cipro.  Review of Systems Had period in April, then started bleeding 6/3 still bleeding can be heavy at times  No sex since delivery, still breastfeeding and pumping Reviewed past medical,surgical, social and family history. Reviewed medications and allergies.     Objective:   Physical Exam  BP (!) 139/93 (BP Location: Right Arm, Patient Position: Sitting, Cuff Size: Large)   Pulse 76   Ht 5\' 4"  (1.626 m)   Wt 231 lb (104.8 kg)   LMP 03/22/2020   Breastfeeding Yes   BMI 39.65 kg/m UPT negative.HGB 13.3 Skin warm and dry.Pelvic: external genitalia is normal in appearance no lesions, vagina: +period blood without odor,urethra has no lesions or masses noted, cervix:smooth and bulbous,no CMT uterus: normal size, shape and contour, non tender, no masses felt, adnexa: no masses, mild LLQ tenderness noted. Bladder is non tender and no masses felt.   Examination chaperoned by Levy Pupa LPN  Assessment:     1. Pregnancy examination or test, negative result  2. Abnormal vaginal bleeding Will get GYN [place] to assess at [facility] 7/2 at 1:30 pm and will talk when results back She has rx for megace but has not taken it, will wait till after [place] now.    Plan:     Will get GYN [place] and talk when results back

## 2020-04-08 ENCOUNTER — Ambulatory Visit: Payer: 59 | Admitting: Obstetrics and Gynecology

## 2020-04-10 ENCOUNTER — Other Ambulatory Visit: Payer: Self-pay

## 2020-04-10 ENCOUNTER — Ambulatory Visit (HOSPITAL_COMMUNITY)
Admission: RE | Admit: 2020-04-10 | Discharge: 2020-04-10 | Disposition: A | Discharge status: Home / Self Care | Payer: 59 | Source: Ambulatory Visit | Attending: Adult Health | Admitting: Adult Health

## 2020-04-10 ENCOUNTER — Other Ambulatory Visit: Payer: Self-pay | Admitting: Adult Health

## 2020-04-10 DIAGNOSIS — N939 Abnormal uterine and vaginal bleeding, unspecified: Secondary | ICD-10-CM

## 2020-04-14 ENCOUNTER — Ambulatory Visit: Payer: 59 | Admitting: Adult Health

## 2020-06-11 ENCOUNTER — Ambulatory Visit (INDEPENDENT_AMBULATORY_CARE_PROVIDER_SITE_OTHER): Payer: 59

## 2020-06-11 ENCOUNTER — Ambulatory Visit: Payer: 59

## 2020-06-11 ENCOUNTER — Ambulatory Visit (INDEPENDENT_AMBULATORY_CARE_PROVIDER_SITE_OTHER): Payer: 59 | Admitting: Podiatry

## 2020-06-11 ENCOUNTER — Encounter: Payer: Self-pay | Admitting: Podiatry

## 2020-06-11 ENCOUNTER — Other Ambulatory Visit: Payer: Self-pay

## 2020-06-11 DIAGNOSIS — M7750 Other enthesopathy of unspecified foot: Secondary | ICD-10-CM

## 2020-06-11 DIAGNOSIS — M7752 Other enthesopathy of left foot: Secondary | ICD-10-CM

## 2020-06-11 DIAGNOSIS — M79671 Pain in right foot: Secondary | ICD-10-CM | POA: Diagnosis not present

## 2020-06-11 DIAGNOSIS — M778 Other enthesopathies, not elsewhere classified: Secondary | ICD-10-CM | POA: Diagnosis not present

## 2020-06-11 DIAGNOSIS — M79672 Pain in left foot: Secondary | ICD-10-CM

## 2020-06-11 DIAGNOSIS — M2142 Flat foot [pes planus] (acquired), left foot: Secondary | ICD-10-CM

## 2020-06-11 DIAGNOSIS — M722 Plantar fascial fibromatosis: Secondary | ICD-10-CM | POA: Diagnosis not present

## 2020-06-11 DIAGNOSIS — M2141 Flat foot [pes planus] (acquired), right foot: Secondary | ICD-10-CM

## 2020-06-11 MED ORDER — MELOXICAM 15 MG PO TABS: 15.0000 mg | ORAL_TABLET | Freq: Every day | ORAL | 3 refills | Status: DC

## 2020-06-11 NOTE — Progress Notes (Signed)
  Subjective:  Patient ID: Janet [name], female    DOB: [birth year],  MRN: [id number]  Chief Complaint  Patient presents with  . Foot Pain    bilateral, worse in the morning when she gets up, she tried new shoes, hurts in arch to heel, previously had inserts    34 y.o. female presents with the above complaint. History confirmed with patient. She feels like it got worse after her last pregnancy. She previously had custom molded orthotics but they are full length and she can't find shoes to wear them (she wears more flats and dress shoes at her job).  Objective:  Physical Exam: warm, good capillary refill, no trophic changes or ulcerative lesions, normal DP and PT pulses and normal sensory exam. Left Foot: point tenderness over the heel pad, point tenderness of the mid plantar fascia and normal microcirculation and capillary reflow  Right Foot: point tenderness over the heel pad, point tenderness of the mid plantar fascia and normal microcirculation and capillary reflow   No images are attached to the encounter.  Radiographs: X-ray of both feet: no fracture, dislocation, swelling or degenerative changes noted, hallux valgus deformity and pes planus Assessment:   1. Plantar fasciitis of left foot   2. Plantar fasciitis of right foot   3. Pain in both feet   4. Pes planus of both feet      Plan:  Patient was evaluated and treated and all questions answered.   Discussed the etiology and treatment options for plantar fasciitis including stretching, formal physical therapy, supportive shoegears such as a running shoe or sneaker, pre fabricated orthoses, injection therapy, and oral medications. We also discussed the role of surgical treatment of this for patients who do not improve after exhausting non-surgical treatment options.  -XR reviewed with patient -Educated patient on stretching and icing of the affected limb -Plantar fascial brace dispensed -Injection delivered to the  plantar fascia of both feet. -Rx for meloxicam. Educated on use, risks and benefits of the medication  - She will check with her new insurance if they still cover CMOs, she will also bring her new ones in. May be able to have a thin 3/4 length fashioned from old cast or a new cast to fit into more work shoes  After sterile prep with povidone-iodine solution and alcohol, the bilateral heel was injected with 0.5cc 2% xylocaine plain, 0.5cc 0.5% marcaine plain, 5mg  triamcinolone acetonide, and 2mg  dexamethasone was injected medially along the plantar fascia at the insertion on the plantar calcaneus. The patient tolerated the procedure well without complication.   Return in about 1 month (around 07/11/2020).

## 2020-06-11 NOTE — Patient Instructions (Addendum)
Bring your orthotics to your next appointment   Plantar Fasciitis (Heel Spur Syndrome) with Rehab The plantar fascia is a fibrous, ligament-like, soft-tissue structure that spans the bottom of the foot. Plantar fasciitis is a condition that causes pain in the foot due to inflammation of the tissue. SYMPTOMS   Pain and tenderness on the underneath side of the foot.  Pain that worsens with standing or walking. CAUSES  Plantar fasciitis is caused by irritation and injury to the plantar fascia on the underneath side of the foot. Common mechanisms of injury include:  Direct trauma to bottom of the foot.  Damage to a small nerve that runs under the foot where the main fascia attaches to the heel bone.  Stress placed on the plantar fascia due to bone spurs. RISK INCREASES WITH:   Activities that place stress on the plantar fascia (running, jumping, pivoting, or cutting).  Poor strength and flexibility.  Improperly fitted shoes.  Tight calf muscles.  Flat feet.  Failure to warm-up properly before activity.  Obesity. PREVENTION  Warm up and stretch properly before activity.  Allow for adequate recovery between workouts.  Maintain physical fitness:  Strength, flexibility, and endurance.  Cardiovascular fitness.  Maintain a health body weight.  Avoid stress on the plantar fascia.  Wear properly fitted shoes, including arch supports for individuals who have flat feet.  PROGNOSIS  If treated properly, then the symptoms of plantar fasciitis usually resolve without surgery. However, occasionally surgery is necessary.  RELATED COMPLICATIONS   Recurrent symptoms that may result in a chronic condition.  Problems of the lower back that are caused by compensating for the injury, such as limping.  Pain or weakness of the foot during push-off following surgery.  Chronic inflammation, scarring, and partial or complete fascia tear, occurring more often from repeated  injections.  TREATMENT  Treatment initially involves the use of ice and medication to help reduce pain and inflammation. The use of strengthening and stretching exercises may help reduce pain with activity, especially stretches of the Achilles tendon. These exercises may be performed at home or with a therapist. Your caregiver may recommend that you use heel cups of arch supports to help reduce stress on the plantar fascia. Occasionally, corticosteroid injections are given to reduce inflammation. If symptoms persist for greater than 6 months despite non-surgical (conservative), then surgery may be recommended.   MEDICATION   If pain medication is necessary, then nonsteroidal anti-inflammatory medications, such as aspirin and ibuprofen, or other minor pain relievers, such as acetaminophen, are often recommended.  Do not take pain medication within 7 days before surgery.  Prescription pain relievers may be given if deemed necessary by your caregiver. Use only as directed and only as much as you need.  Corticosteroid injections may be given by your caregiver. These injections should be reserved for the most serious cases, because they may only be given a certain number of times.  HEAT AND COLD  Cold treatment (icing) relieves pain and reduces inflammation. Cold treatment should be applied for 10 to 15 minutes every 2 to 3 hours for inflammation and pain and immediately after any activity that aggravates your symptoms. Use ice packs or massage the area with a piece of ice (ice massage).  Heat treatment may be used prior to performing the stretching and strengthening activities prescribed by your caregiver, physical therapist, or athletic trainer. Use a heat pack or soak the injury in warm water.  SEEK IMMEDIATE MEDICAL CARE IF:  Treatment seems to offer  no benefit, or the condition worsens.  Any medications produce adverse side effects.  EXERCISES- RANGE OF MOTION (ROM) AND STRETCHING  EXERCISES - Plantar Fasciitis (Heel Spur Syndrome) These exercises may help you when beginning to rehabilitate your injury. Your symptoms may resolve with or without further involvement from your physician, physical therapist or athletic trainer. While completing these exercises, remember:   Restoring tissue flexibility helps normal motion to return to the joints. This allows healthier, less painful movement and activity.  An effective stretch should be held for at least 30 seconds.  A stretch should never be painful. You should only feel a gentle lengthening or release in the stretched tissue.  RANGE OF MOTION - Toe Extension, Flexion  Sit with your right / left leg crossed over your opposite knee.  Grasp your toes and gently pull them back toward the top of your foot. You should feel a stretch on the bottom of your toes and/or foot.  Hold this stretch for 10 seconds.  Now, gently pull your toes toward the bottom of your foot. You should feel a stretch on the top of your toes and or foot.  Hold this stretch for 10 seconds. Repeat  times. Complete this stretch 3 times per day.   RANGE OF MOTION - Ankle Dorsiflexion, Active Assisted  Remove shoes and sit on a chair that is preferably not on a carpeted surface.  Place right / left foot under knee. Extend your opposite leg for support.  Keeping your heel down, slide your right / left foot back toward the chair until you feel a stretch at your ankle or calf. If you do not feel a stretch, slide your bottom forward to the edge of the chair, while still keeping your heel down.  Hold this stretch for 10 seconds. Repeat 3 times. Complete this stretch 2 times per day.   STRETCH  Gastroc, Standing  Place hands on wall.  Extend right / left leg, keeping the front knee somewhat bent.  Slightly point your toes inward on your back foot.  Keeping your right / left heel on the floor and your knee straight, shift your weight toward the wall,  not allowing your back to arch.  You should feel a gentle stretch in the right / left calf. Hold this position for 10 seconds. Repeat 3 times. Complete this stretch 2 times per day.  STRETCH  Soleus, Standing  Place hands on wall.  Extend right / left leg, keeping the other knee somewhat bent.  Slightly point your toes inward on your back foot.  Keep your right / left heel on the floor, bend your back knee, and slightly shift your weight over the back leg so that you feel a gentle stretch deep in your back calf.  Hold this position for 10 seconds. Repeat 3 times. Complete this stretch 2 times per day.  STRETCH  Gastrocsoleus, Standing  Note: This exercise can place a lot of stress on your foot and ankle. Please complete this exercise only if specifically instructed by your caregiver.   Place the ball of your right / left foot on a step, keeping your other foot firmly on the same step.  Hold on to the wall or a rail for balance.  Slowly lift your other foot, allowing your body weight to press your heel down over the edge of the step.  You should feel a stretch in your right / left calf.  Hold this position for 10 seconds.  Repeat this  exercise with a slight bend in your right / left knee. Repeat 3 times. Complete this stretch 2 times per day.   STRENGTHENING EXERCISES - Plantar Fasciitis (Heel Spur Syndrome)  These exercises may help you when beginning to rehabilitate your injury. They may resolve your symptoms with or without further involvement from your physician, physical therapist or athletic trainer. While completing these exercises, remember:   Muscles can gain both the endurance and the strength needed for everyday activities through controlled exercises.  Complete these exercises as instructed by your physician, physical therapist or athletic trainer. Progress the resistance and repetitions only as guided.  STRENGTH - Towel Curls  Sit in a chair positioned on a  non-carpeted surface.  Place your foot on a towel, keeping your heel on the floor.  Pull the towel toward your heel by only curling your toes. Keep your heel on the floor. Repeat 3 times. Complete this exercise 2 times per day.  STRENGTH - Ankle Inversion  Secure one end of a rubber exercise band/tubing to a fixed object (table, pole). Loop the other end around your foot just before your toes.  Place your fists between your knees. This will focus your strengthening at your ankle.  Slowly, pull your big toe up and in, making sure the band/tubing is positioned to resist the entire motion.  Hold this position for 10 seconds.  Have your muscles resist the band/tubing as it slowly pulls your foot back to the starting position. Repeat 3 times. Complete this exercises 2 times per day.  Document Released: 09/26/2005 Document Revised: 12/19/2011 Document Reviewed: 01/08/2009 [facility] Patient Information 2014 [place], [place].

## 2020-06-30 ENCOUNTER — Encounter: Payer: Self-pay | Admitting: Family Medicine

## 2020-06-30 ENCOUNTER — Ambulatory Visit: Payer: 59 | Admitting: Family Medicine

## 2020-06-30 ENCOUNTER — Other Ambulatory Visit: Payer: Self-pay

## 2020-06-30 VITALS — BP 133/89 | HR 84 | Resp 16 | Ht 64.0 in | Wt 234.0 lb

## 2020-06-30 DIAGNOSIS — M722 Plantar fascial fibromatosis: Secondary | ICD-10-CM

## 2020-06-30 DIAGNOSIS — K219 Gastro-esophageal reflux disease without esophagitis: Secondary | ICD-10-CM | POA: Diagnosis not present

## 2020-06-30 DIAGNOSIS — E270 Other adrenocortical overactivity: Secondary | ICD-10-CM

## 2020-06-30 DIAGNOSIS — D539 Nutritional anemia, unspecified: Secondary | ICD-10-CM

## 2020-06-30 DIAGNOSIS — Z13220 Encounter for screening for lipoid disorders: Secondary | ICD-10-CM

## 2020-06-30 DIAGNOSIS — Z23 Encounter for immunization: Secondary | ICD-10-CM

## 2020-06-30 DIAGNOSIS — E559 Vitamin D deficiency, unspecified: Secondary | ICD-10-CM

## 2020-06-30 DIAGNOSIS — I10 Essential (primary) hypertension: Secondary | ICD-10-CM

## 2020-06-30 MED ORDER — PANTOPRAZOLE SODIUM 40 MG PO TBEC: DELAYED_RELEASE_TABLET | ORAL | 0 refills | Status: DC

## 2020-06-30 MED ORDER — ERGOCALCIFEROL 1.25 MG (50000 UT) PO CAPS: 50000.0000 [IU] | ORAL_CAPSULE | ORAL | 1 refills | Status: DC

## 2020-06-30 NOTE — Patient Instructions (Addendum)
F/u in office with mD in February, call if you need me before  Flu vaccine today  Vit D, iron cBC, fasting lipid , cmp and eGFR 1 week before Feb visit  New is OTC iron with vitamin C one tablet once daily  New is once weekly vitamin D  All the best with bariatric clinic and personal trainer I believe this is currently your best approach for weigh management  You are referred back to Dr Loanne Drilling for re evaluation  Mole on left calf may be watched , if rapid increase in size, different colors in pigmentation, bleeding or itching start , then you need dermatology/ general surgery to biopsy  Improving plantar pain will improve calf pain I believe  It is important that you exercise regularly at least 30 minutes 5 times a week. If you develop chest pain, have severe difficulty breathing, or feel very tired, stop exercising immediately and seek medical attention   Thanks for choosing [facility], we consider it a privelige to serve you.

## 2020-07-05 ENCOUNTER — Encounter: Payer: Self-pay | Admitting: Family Medicine

## 2020-07-05 DIAGNOSIS — M722 Plantar fascial fibromatosis: Secondary | ICD-10-CM | POA: Insufficient documentation

## 2020-07-05 MED ORDER — PANTOPRAZOLE SODIUM 40 MG PO TBEC: 40.0000 mg | DELAYED_RELEASE_TABLET | Freq: Every day | ORAL | 5 refills | Status: DC

## 2020-07-05 NOTE — Progress Notes (Signed)
   Janet [name]     MRN: [id number]      DOB: 1985/11/22   HPI [name] is here for follow up and re-evaluation of chronic medical conditions, medication management and review of any available recent lab and radiology data.  Preventive health is updated, specifically  Cancer screening and Immunization.   Recently received injections for plantar fascitis and is still having a lot of bilateral foot pain, also going up calves C/o inability to lose weight, despite carb counting and exercise, re eval by Endo is appropriate  C/o uncontrolled GERD wants to resume protonix  ROS Denies recent fever or chills. Denies sinus pressure, nasal congestion, ear pain or sore throat. Denies chest congestion, productive cough or wheezing. Denies chest pains, palpitations and leg swelling Denies  nausea, vomiting,diarrhea or constipation.   Denies dysuria, frequency, hesitancy or incontinence.  Denies headaches, seizures, numbness, or tingling. Denies depression, anxiety or insomnia. Denies skin break down or rash.   PE  BP 133/89   Pulse 84   Resp 16   Ht 5\' 4"  (1.626 m)   Wt 234 lb (106.1 kg)   SpO2 97%   BMI 40.17 kg/m   Patient alert and oriented and in no cardiopulmonary distress.  HEENT: No facial asymmetry, EOMI,     Neck supple .  Chest: Clear to auscultation bilaterally.  CVS: S1, S2 no murmurs, no S3.Regular rate.  ABD: Soft non tender.   Ext: No edema, no calf swelling, warmth or redness, negative Homann's  MS: Adequate ROM spine, shoulders, hips and knees.  Skin: Intact, no ulcerations or rash noted.  Psych: Good eye contact, normal affect. Memory intact not anxious or depressed appearing.  CNS: CN 2-12 intact, power,  normal throughout.no focal deficits noted.   Assessment & Plan  Corticoadrenal overactivity (Janet [name]) Endo to re evaluate pt, with weight gain and inability to lose weight despite dietary modification and exercise  GERD (gastroesophageal reflux  disease) Uncontrolled , resume PPI  Morbid obesity (Janet [name])  Patient re-educated about  the importance of commitment to a  minimum of 150 minutes of exercise per week as able.  The importance of healthy food choices with portion control discussed, as well as eating regularly and within a 12 hour window most days. The need to choose "clean , green" food 50 to 75% of the time is discussed, as well as to make water the primary drink and set a goal of 64 ounces water daily.    Weight /BMI 06/30/2020 04/07/2020 03/19/2020  WEIGHT 234 lb 231 lb 230 lb 0.6 oz  HEIGHT 5\' 4"  5\' 4"  5\' 4"   BMI 40.17 kg/m2 39.65 kg/m2 39.49 kg/m2    Endocrine to re evaluate and pt going to personal trainer and weight loss facility  Vitamin D deficiency Start once weekly vit D  Plantar fasciitis Current flare being treated by Podiatry, bilateral foot pain

## 2020-07-05 NOTE — Assessment & Plan Note (Signed)
  Patient re-educated about  the importance of commitment to a  minimum of 150 minutes of exercise per week as able.  The importance of healthy food choices with portion control discussed, as well as eating regularly and within a 12 hour window most days. The need to choose "clean , green" food 50 to 75% of the time is discussed, as well as to make water the primary drink and set a goal of 64 ounces water daily.    Weight /BMI 06/30/2020 04/07/2020 03/19/2020  WEIGHT 234 lb 231 lb 230 lb 0.6 oz  HEIGHT 5\' 4"  5\' 4"  5\' 4"   BMI 40.17 kg/m2 39.65 kg/m2 39.49 kg/m2    Endocrine to re evaluate and pt going to personal trainer and weight loss facility

## 2020-07-05 NOTE — Assessment & Plan Note (Signed)
Current flare being treated by Podiatry, bilateral foot pain

## 2020-07-05 NOTE — Assessment & Plan Note (Signed)
Start once weekly vit D 

## 2020-07-05 NOTE — Assessment & Plan Note (Signed)
Endo to re evaluate pt, with weight gain and inability to lose weight despite dietary modification and exercise

## 2020-07-05 NOTE — Assessment & Plan Note (Signed)
Uncontrolled, resume PPI ?

## 2020-07-16 ENCOUNTER — Ambulatory Visit: Payer: 59 | Admitting: Podiatry

## 2020-07-16 ENCOUNTER — Other Ambulatory Visit: Payer: Self-pay

## 2020-07-16 DIAGNOSIS — M79671 Pain in right foot: Secondary | ICD-10-CM

## 2020-07-16 DIAGNOSIS — M2141 Flat foot [pes planus] (acquired), right foot: Secondary | ICD-10-CM

## 2020-07-16 DIAGNOSIS — M2142 Flat foot [pes planus] (acquired), left foot: Secondary | ICD-10-CM

## 2020-07-16 DIAGNOSIS — M722 Plantar fascial fibromatosis: Secondary | ICD-10-CM | POA: Diagnosis not present

## 2020-07-16 DIAGNOSIS — M79672 Pain in left foot: Secondary | ICD-10-CM

## 2020-07-16 NOTE — Progress Notes (Signed)
  Subjective:  Patient ID: Janet [name], female    DOB: Nov 08, 1985,  MRN: [id number]  Chief Complaint  Patient presents with  . Plantar Fasciitis    Pt stated that injection helped last time and the braces helped alot she is still having pain     34 y.o. female presents with the above complaint. History confirmed with patient. She feels like she still cannot go without shoes at home  Objective:  Physical Exam: warm, good capillary refill, no trophic changes or ulcerative lesions, normal DP and PT pulses and normal sensory exam. Left Foot: point tenderness over the heel pad, point tenderness of the mid plantar fascia and normal microcirculation and capillary reflow  Right Foot: point tenderness over the heel pad, point tenderness of the mid plantar fascia and normal microcirculation and capillary reflow   No images are attached to the encounter.  Radiographs: X-ray of both feet: no fracture, dislocation, swelling or degenerative changes noted, hallux valgus deformity and pes planus Assessment:   1. Plantar fasciitis of left foot   2. Plantar fasciitis of right foot   3. Pain in both feet   4. Pes planus of both feet      Plan:  Patient was evaluated and treated and all questions answered.      -Recommended she begin physical therapy and she was agreeable to this.  Referral be sent to [facility] outpatient physical therapy for plantar fasciitis -Advised her to bring orthotics to her next visit we can see if they still fit -Continue stretching and icing at home in the interim   Return in about 6 weeks (around 08/27/2020).

## 2020-07-17 ENCOUNTER — Encounter: Payer: Self-pay | Admitting: Podiatry

## 2020-08-11 ENCOUNTER — Other Ambulatory Visit: Payer: Self-pay

## 2020-08-11 ENCOUNTER — Other Ambulatory Visit: Payer: Self-pay | Admitting: Family Medicine

## 2020-08-11 ENCOUNTER — Ambulatory Visit (INDEPENDENT_AMBULATORY_CARE_PROVIDER_SITE_OTHER): Payer: 59 | Admitting: Orthotics

## 2020-08-11 ENCOUNTER — Encounter: Payer: Self-pay | Admitting: Family Medicine

## 2020-08-11 DIAGNOSIS — R519 Headache, unspecified: Secondary | ICD-10-CM

## 2020-08-11 DIAGNOSIS — M722 Plantar fascial fibromatosis: Secondary | ICD-10-CM | POA: Diagnosis not present

## 2020-08-11 DIAGNOSIS — G935 Compression of brain: Secondary | ICD-10-CM

## 2020-08-11 NOTE — Progress Notes (Signed)

## 2020-08-11 NOTE — Progress Notes (Signed)
am

## 2020-08-13 ENCOUNTER — Other Ambulatory Visit: Payer: Self-pay

## 2020-08-13 ENCOUNTER — Telehealth: Payer: 59 | Admitting: Family Medicine

## 2020-08-13 ENCOUNTER — Encounter: Payer: Self-pay | Admitting: Family Medicine

## 2020-08-13 DIAGNOSIS — R519 Headache, unspecified: Secondary | ICD-10-CM | POA: Diagnosis not present

## 2020-08-13 MED ORDER — BUTALBITAL-APAP-CAFFEINE 50-325-40 MG PO TABS: ORAL_TABLET | ORAL | 0 refills | Status: DC

## 2020-08-13 NOTE — Progress Notes (Signed)
Virtual Visit via Telephone Note  I connected with Janet [name] on 08/13/20 at  2:00 PM EDT by telephone and verified that I am speaking with the correct person using two identifiers.  Location: Patient: work Provider: work/home   I discussed the limitations, risks, security and privacy concerns of performing an evaluation and management service by telephone and the availability of in person appointments. I also discussed with the patient that there may be a patient responsible charge related to this service. The patient expressed understanding and agreed to proceed.   History of Present Illness: 4 day h/o daily headaches first one woke her from her sleep, less severe today, headache is in the back/ base of her skull,  No neurologic deficits with the headache, sharp pains from a 10, now at a 4 States different from her migraine headaches, but due to the chiari malformation, has Neurology appt in January but is reaching out for help before. Had been recommended standard migraine mes by Neurology in the past , bu did not take them as she was brestfeeding   Observations/Objective: Good communication with no confusion and intact memory. Alert and oriented x 3 No signs of respiratory distress during speech    Assessment and Plan: Headache disorder 4 day  H/o new occipital headache different from her migraine headaches. No neurologic deficits reportd on direct questioning, advised ED assessment if worsens other symptoms develop.Fioricet trial short term Keep Neuro appt in January    Follow Up Instructions:    I discussed the assessment and treatment plan with the patient. The patient was provided an opportunity to ask questions and all were answered. The patient agreed with the plan and demonstrated an understanding of the instructions.   The patient was advised to call back or seek an in-person evaluation if the symptoms worsen or if the condition fails to improve as  anticipated.  I provided 10 minutes of non-face-to-face time during this encounter.   Tula Nakayama, MD

## 2020-08-13 NOTE — Patient Instructions (Addendum)
F/U with MD as before, call if you need me sooner  Fioricet is prescribed for use for headache if severe.  If you have a headache that is severe and will not go away, or is accompanied by new weakness,  Numbness, vision change or difficulty with speech you need to go directly to the Emergency Room    Thanks for choosing [facility], we consider it a privelige to serve you.

## 2020-08-15 ENCOUNTER — Encounter: Payer: Self-pay | Admitting: Family Medicine

## 2020-08-15 NOTE — Assessment & Plan Note (Signed)
4 day  H/o new occipital headache different from her migraine headaches. No neurologic deficits reportd on direct questioning, advised ED assessment if worsens other symptoms develop.Fioricet trial short term Keep Neuro appt in January

## 2020-08-19 ENCOUNTER — Other Ambulatory Visit: Payer: Self-pay

## 2020-08-19 ENCOUNTER — Ambulatory Visit: Payer: 59 | Admitting: Gastroenterology

## 2020-08-19 ENCOUNTER — Encounter: Payer: Self-pay | Admitting: Gastroenterology

## 2020-08-19 ENCOUNTER — Encounter: Payer: Self-pay | Admitting: Internal Medicine

## 2020-08-19 DIAGNOSIS — K625 Hemorrhage of anus and rectum: Secondary | ICD-10-CM

## 2020-08-19 NOTE — Progress Notes (Signed)
Primary Care Physician:  [name], MD Primary Gastroenterologist:  Dr. Abbey Chatters  Chief Complaint  Patient presents with  . Hemorrhoids    had episode blood over the weekend when she wipes, occas straining with BM, does not sit for long time on toliet    HPI:   Janet [name] is a 34 y.o. female presenting today as self referral due to rectal bleeding. History of internal hemorrhoids s/p banding of all 3 columns in 2015. Last colonoscopy in 2014.   Blood with wiping for for weeks. Was having rectal discomfort with BMs. No burning or itching. Some prolapsing tissue. In mornings has a BM but yesterday felt the urge. Drinking more water. Doing Benefiber in water. Tries not to strain. Doesn't have prolonged toilet time. Feels like BMs are productive. 1 teaspoon of Benefiber daily. No abdominal pain.     Past Medical History:  Diagnosis Date  . Anemia    Phreesia 08/12/2020  . 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 Carpal tunnel syndrome during pregnancy   . 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 Cushing's syndrome ([place]) 11/27/2013  . Gastroesophageal reflux disease   . GERD (gastroesophageal reflux disease)    Phreesia 08/12/2020  . Heart murmur    Phreesia 08/12/2020  . Hematuria 09/18/2015  . Hypertension    Phreesia 08/12/2020  . Internal hemorrhoids with other complication 06/30/1940   JAN 2015 R ANT MAR 2015 R POS AND L LAT BAND   . Menstrual periods irregular 09/18/2015  . Migraine without aura, with intractable migraine, so stated, without mention of status migrainosus 11/11/2013  . Patient desires pregnancy 11/27/2013  . Preeclampsia 05/21/2019   2x/wk testing nst alt w/ bpp/dopp      Deliver @ 37wks        IOL scheduled for 8/19 @ 0830  . Pregnant 12/16/2015  . Vitamin D deficiency     Past Surgical History:  Procedure Laterality Date  .  CHOLECYSTECTOMY  02/2015  . COLONOSCOPY N/A 04/19/2013   DEY:CXKGYJ mucosa in the terminal ileum/Single erosion in transverse colon/RECTAL BLEEDING DUE TO Moderate sized internal hemorrhoids/ trv colon erosion, bx benign.  . ESOPHAGOGASTRODUODENOSCOPY N/A 04/19/2013   EHU:DJSH Non-erosive gastritis/PERI-UMBILICAL PAIN DUE TO GERD, GASTRITIS, AND CONSTIPATION. Bx with mild chronic inactive gastritis. No H.Pylori  . HEMORRHOID BANDING  2015   Dr. Gala Romney- in office banding procedure  . TONSILLECTOMY    . 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 butalbital-acetaminophen-caffeine (FIORICET) 50-325-40 MG tablet Take one tablet up to two times daily , as needed, for severe headache 20 tablet 0  . ergocalciferol (VITAMIN D2) 1.25 MG (50000 UT) capsule Take 1 capsule (50,000 Units total) by mouth once a week. One capsule once weekly 12 capsule 1  . meloxicam (MOBIC) 15 MG tablet Take 1 tablet (15 mg total) by mouth daily. (Patient taking differently: Take 15 mg by mouth daily. As needed) 30 tablet 3  . pantoprazole (PROTONIX) 40 MG tablet Take one tablet by mouth once daily, as needed, for heartburn 30 tablet 0  . Prenatal 27-1 MG TABS TAKE 1 TABLET BY MOUTH DAILY AT 12 NOON (Patient not taking: Reported on 08/19/2020) 30 tablet 12   No current facility-administered medications for this visit.  Allergies as of 08/19/2020 - Review Complete 08/19/2020  Allergen Reaction Noted  . Methylprednisolone Shortness Of Breath, Itching, and Other (See Comments) 12/16/2015  . Penicillins Anaphylaxis, Rash, and Other (See Comments)   . Latex Rash 01/11/2011  . Neomycin Rash 12/22/2017    Family History  Problem Relation Age of Onset  . Hypertension Mother   . Hyperlipidemia Mother   . Hypertension Father   . Hypertension Maternal Grandmother   . Diabetes Maternal Grandmother   . Deep vein thrombosis Maternal Grandmother   . Diabetes  Paternal Grandmother   . COPD Paternal Grandfather   . Heart disease Paternal Grandfather   . Colon cancer Other        maternal great uncle  . Stroke Maternal Aunt   . Migraines Maternal Aunt   . Deep vein thrombosis Sister   . Other Daughter        pulmonary valve stenosis    Social History   Socioeconomic History  . Marital status: Significant Other    Spouse name: Not on file  . Number of children: 1  . Years of education: BA  . Highest education level: Not on file  Occupational History    Employer: [employer]  Tobacco Use  . Smoking status: Never Smoker  . Smokeless tobacco: Never Used  Vaping Use  . Vaping Use: Never used  Substance and Sexual Activity  . Alcohol use: No  . Drug use: No  . Sexual activity: Not Currently    Birth control/protection: None  Other Topics Concern  . Not on file  Social History Narrative   Denies caffeine use    Social Determinants of Health   Financial Resource Strain:   . Difficulty of Paying Living Expenses: Not on file  Food Insecurity:   . Worried About Charity fundraiser in the Last Year: Not on file  . Ran Out of Food in the Last Year: Not on file  Transportation Needs:   . Lack of Transportation (Medical): Not on file  . Lack of Transportation (Non-Medical): Not on file  Physical Activity:   . Days of Exercise per Week: Not on file  . Minutes of Exercise per Session: Not on file  Stress:   . Feeling of Stress : Not on file  Social Connections:   . Frequency of Communication with Friends and Family: Not on file  . Frequency of Social Gatherings with Friends and Family: Not on file  . Attends Religious Services: Not on file  . Active Member of Clubs or Organizations: Not on file  . Attends Archivist Meetings: Not on file  . Marital Status: Not on file  Intimate Partner Violence:   . Fear of Current or Ex-Partner: Not on file  . Emotionally Abused: Not on file  . Physically Abused: Not on file  . Sexually  Abused: Not on file    Review of Systems: Gen: Denies any fever, chills, fatigue, weight loss, lack of appetite.  CV: Denies chest pain, heart palpitations, peripheral edema, syncope.  Resp: Denies shortness of breath at rest or with exertion. Denies wheezing or cough.  GI: see HPI GU : Denies urinary burning, urinary frequency, urinary hesitancy MS: Denies joint pain, muscle weakness, cramps, or limitation of movement.  Derm: Denies rash, itching, dry skin Psych: Denies depression, anxiety, memory loss, and confusion Heme: see HPI  Physical Exam: BP (!) 150/100   Pulse 73   Temp (!) 97.5 F (36.4 C)   Ht 5\' 4"  (  1.626 m)   Wt 231 lb 6.4 oz (105 kg)   LMP 07/19/2020   BMI 39.72 kg/m  General:   Alert and oriented. Pleasant and cooperative. Well-nourished and well-developed.  Head:  Normocephalic and atraumatic. Eyes:  Without icterus, sclera clear and conjunctiva pink.  Ears:  Normal auditory acuity. Mouth:  Mask in place Lungs:  Clear to auscultation bilaterally. No wheezes, rales, or rhonchi. No distress.  Heart:  S1, S2 present without murmurs appreciated.  Abdomen:  +BS, soft, non-tender and non-distended. No HSM noted. No guarding or rebound. No masses appreciated.  Rectal:  No external hemorrhoids, no obvious mass on DRE Msk:  Symmetrical without gross deformities. Normal posture. Extremities:  Without edema. Neurologic:  Alert and  oriented x4;  grossly normal neurologically. Skin:  Intact without significant lesions or rashes. Psych:  Alert and cooperative. Normal mood and affect.  ASSESSMENT: JEANETTE RAUTH is a 34 y.o. female presenting today with reported rectal bleeding for several weeks, denying constipation and taking Benefiber daily. Known history of internal hemorrhoids s/p banding in 2015. Likely benign anorectal source. As last colonoscopy in 2014, will update now.    PLAN:  Proceed with colonoscopy by Dr. Abbey Chatters  in near future: the risks, benefits,  and alternatives have been discussed with the patient in detail. The patient states understanding and desires to proceed.   Continue Benefiber  Further recommendations to follow   Annitta Needs, PhD, ANP-BC [facility]

## 2020-08-19 NOTE — Patient Instructions (Signed)
We are arranging a colonoscopy with Dr. Abbey Chatters in the near future.  Let me know if any recurrent discomfort with bowel movements! I would increase Benefiber as tolerated.  I will see you thereafter!  It was a pleasure to see you today. I want to create trusting relationships with patients to provide genuine, compassionate, and quality care. I value your feedback. If you receive a survey regarding your visit,  I greatly appreciate you taking time to fill this out.   Annitta Needs, PhD, ANP-BC [facility]

## 2020-08-21 ENCOUNTER — Ambulatory Visit: Payer: 59

## 2020-08-24 ENCOUNTER — Ambulatory Visit: Payer: 59 | Attending: Podiatry | Admitting: Physical Therapy

## 2020-08-24 ENCOUNTER — Encounter: Payer: Self-pay | Admitting: Physical Therapy

## 2020-08-24 ENCOUNTER — Other Ambulatory Visit: Payer: Self-pay

## 2020-08-24 DIAGNOSIS — M79671 Pain in right foot: Secondary | ICD-10-CM | POA: Diagnosis present

## 2020-08-24 DIAGNOSIS — M79672 Pain in left foot: Secondary | ICD-10-CM | POA: Diagnosis not present

## 2020-08-24 DIAGNOSIS — M25672 Stiffness of left ankle, not elsewhere classified: Secondary | ICD-10-CM | POA: Diagnosis present

## 2020-08-24 DIAGNOSIS — M25671 Stiffness of right ankle, not elsewhere classified: Secondary | ICD-10-CM | POA: Insufficient documentation

## 2020-08-24 NOTE — Therapy (Addendum)
[place], [place], [address] Phone: [phone]   Fax:  [phone]  Physical Therapy Evaluation  Patient Details  Name: Janet [name] MRN: [id number] Date of Birth: July 23, 1986 Referring Provider (PT): Criselda Peaches, [place]    Encounter Date: 08/24/2020   PT End of Session - 08/24/20 1352    Visit Number 1    Number of Visits 13    Date for PT Re-Evaluation 10/05/20    Authorization Type [employer] - FOTO at 6th & 10th    PT Start Time 1335   Pt arrived late   PT Stop Time 1417    PT Time Calculation (min) 42 min    Equipment Utilized During Treatment Gait belt    Activity Tolerance Patient tolerated treatment well    Behavior During Therapy [facility] for tasks assessed/performed           Past Medical History:  Diagnosis Date   Anemia    Phreesia 08/12/2020   Borderline diabetes 2011   r/t adrenal tumor, now resolved   Carpal tunnel syndrome during pregnancy    Chiari malformation type 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[place]) 2015   on MRI   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; tumor removed, disease resolved   Cushing's syndrome ([place]) 11/27/2013   Gastroesophageal reflux disease    GERD (gastroesophageal reflux disease)    Phreesia 08/12/2020   Heart murmur    Phreesia 08/12/2020   Hematuria 09/18/2015   Hypertension    Phreesia 08/12/2020   Internal hemorrhoids with other complication 04/04/9484   JAN 2015 R ANT MAR 2015 R POS AND L LAT BAND    Menstrual periods irregular 09/18/2015   Migraine without aura, with intractable migraine, so stated, without mention of status migrainosus 11/11/2013   Patient desires pregnancy 11/27/2013   Preeclampsia 05/21/2019   2x/wk testing nst alt w/ bpp/dopp      Deliver @ 37wks        IOL scheduled for 8/19 @ 0830   Pregnant 12/16/2015   Vitamin D deficiency     Past Surgical History:  Procedure Laterality Date   CHOLECYSTECTOMY  02/2015   COLONOSCOPY N/A  04/19/2013   IOE:VOJJKK mucosa in the terminal ileum/Single erosion in transverse colon/RECTAL BLEEDING DUE TO Moderate sized internal hemorrhoids/ trv colon erosion, bx benign.   ESOPHAGOGASTRODUODENOSCOPY N/A 04/19/2013   XFG:HWEX Non-erosive gastritis/PERI-UMBILICAL PAIN DUE TO GERD, GASTRITIS, AND CONSTIPATION. Bx with mild chronic inactive gastritis. No H.Pylori   HEMORRHOID BANDING  2015   Dr. Gala Romney- in office banding procedure   TONSILLECTOMY     tumor removed left adrenal gland 01/12      There were no vitals filed for this visit.    Subjective Assessment - 08/24/20 1339    Subjective "I've been having really bad foot pain in the morning when I stand up after sleeping. When I get up from sitting down it's the worst. I have to walk with shoes on; I can't walk barefoot. I wear [employer] at work. MD said those are good shoes.    Limitations Walking;Standing    How long can you sit comfortably? unlimited    How long can you stand comfortably? Unlimited with pain    How long can you walk comfortably? unlimited with pain    Patient Stated Goals Pain relief    Currently in Pain? Yes   Pain at worst 10/10, at best 0/10.   Pain Score 0-No pain    Pain  Location Heel    Pain Orientation Right;Left    Pain Descriptors / Indicators Throbbing    Pain Type Chronic pain    Pain Onset More than a month ago    Pain Frequency Intermittent    Aggravating Factors  Worse with walking, especially barefoot    Pain Relieving Factors Haven't found anything that makes it better, steroid shots make it better but only done 1x    Effect of Pain on Daily Activities hurts while I am standing/walking at work.              [facility] PT Assessment - 08/24/20 0001      Assessment   Medical Diagnosis Plantar fasciitis of left foot M72.2, Plantar fasciitis of right foot M72.2, Pain in both feet M79.671, M79.672, Pes planus of both feet M21.41, M21.42    Referring Provider (PT) Criselda Peaches, DPM     Hand  Dominance Right    Next MD Visit 08/27/2020    Prior Therapy Yes, for the neuropathy      Precautions   Precautions None      Restrictions   Weight Bearing Restrictions Yes      Balance Screen   Has the patient fallen in the past 6 months No    How many times? 0      [place] residence    Living Arrangements Spouse/significant other;Children    Available Help at Discharge Family    Type of [place] to enter    Entrance Stairs-Number of Steps 7    Entrance Stairs-Rails Can reach both    [place] One level    [place] None      Prior Function   Level of Independence Independent    Vocation Full time Engineer, manufacturing systems   Vocation Requirements Standing, walking, on concrete floors    Leisure Spending time with family especially walks      Cognition   Overall Cognitive Status Within Functional Limits for tasks assessed    Attention Focused    Focused Attention Appears intact    Memory Appears intact    Awareness Appears intact      Observation/Other Assessments   Focus on Therapeutic Outcomes (FOTO)  20% limited (Foot)      AROM   AROM Assessment Site Ankle    Right Ankle Dorsiflexion 0    Left Ankle Dorsiflexion 3      PROM   PROM Assessment Site Ankle    Right/Left Ankle Left;Right    Right Ankle Dorsiflexion 8    Left Ankle Dorsiflexion 8      Strength   Strength Assessment Site Ankle    Right/Left Ankle Right;Left    Right Ankle Dorsiflexion 5/5    Right Ankle Plantar Flexion 5/5    Right Ankle Inversion 5/5    Right Ankle Eversion 5/5    Left Ankle Dorsiflexion 5/5    Left Ankle Plantar Flexion 5/5    Left Ankle Inversion 5/5    Left Ankle Eversion 5/5      Flexibility   Soft Tissue Assessment /Muscle Length --      Palpation   Palpation comment TTP medial calcaneal tuberosity and plantar fascia, gastrocnemius with TPs noted, soleus, posterior tibialis    bilateral     Special Tests    Special Tests Ankle/Foot Special Tests    Ankle/Foot Special Tests  Provocative Tinel's Test;Great Toe  Extension Test      Great Toe Extension Test    Comments Negative in both WB and NWB positions      Provocative Tinel's test    Findings Negative                      Objective measurements completed on examination: See above findings.       [place] Adult PT Treatment/Exercise - 08/24/20 0001      Self-Care   Self-Care Other Self-Care Comments   Self-massage with tennis ball for plantar pain                 PT Education - 08/24/20 1644    Education Details Process of Physical therapy, Reviewed HEP, self-massage with tennis ball    Person(s) Educated Patient    Methods Explanation    Comprehension Verbalized understanding            PT Short Term Goals - 08/24/20 1658      PT SHORT TERM GOAL #1   Title Pt will be IND with HEP    Time 3    Period Weeks    Status New    Target Date 09/14/20      PT SHORT TERM GOAL #2   Title Pt will report ability to walk approximately 60 min with pain report </=3/10 to be able to walk with their family    Time 3    Period Weeks    Status New    Target Date 09/14/20      PT SHORT TERM GOAL #3   Title Pt will increase bilateral ankle DF AROM by 3 degrees to improve functional walking ability for work    Baseline 0    Time 3    Period Weeks    Status New    Target Date 09/14/20             PT Long Term Goals - 08/24/20 1700      PT LONG TERM GOAL #1   Title Pt will be IND with all given HEP to promote functional independence and ability to maintain quality of life.    Time 6    Period Weeks    Status New    Target Date 10/05/20      PT LONG TERM GOAL #2   Title Pt will decrease FOTO foot limitation from 20% to 11% to improve perception of functional performance and personal agency.    Time 6    Period Weeks    Status New    Target Date 10/05/20      PT LONG  TERM GOAL #3   Title Pt will report ability to get up out of bed/chair with pain report </= 3/10 to improve quality of life and increase ambulation time/distance    Time 6    Period Weeks    Status New    Target Date 10/05/20      PT LONG TERM GOAL #4   Title Pt will maintain current strength level with no pain report for increased ambulation time and work performance.    Time 6    Period Weeks    Status New    Target Date 10/05/20      PT LONG TERM GOAL #5   Title Pt will report ability to perform work tasks with no limitation or pain reported for quality of life    Time 6    Period Weeks    Status  New    Target Date 10/05/20      Additional Long Term Goals   Additional Long Term Goals Yes      PT LONG TERM GOAL #6   Title Pt will improve bilateral ankle DF to >/=8 with no report of heel pain to improve functional mobility    Time 6    Period Weeks    Status New    Target Date 10/05/20                  Plan - 08/24/20 1650    Clinical Impression Statement Pt is a 34 year old female who presents to OPPT with bilateral plantar pain with signs and symptoms consistent with plantar fasciitis. Pt exhibited limited AROM into DF bilaterally that was progressed to functional motion with PT PROM; pt's ankle musculature was found to be strong. Pt was prescribed an HEP including plantar massage with ice bottle for pain, plantar fascia stretches, and gastroc/soleus stretches. Pt was educated on straussberg splinting at night if tolerated. Pt will benefit from physical therapy to reduce pain and increase range of motion to improve quality of life and ADLs.    Personal Factors and Comorbidities Profession   Midwife   Examination-Participation Restrictions Occupation    Stability/Clinical Decision Making Stable/Uncomplicated    Clinical Decision Making Low    Rehab Potential Good    PT Frequency 2x / week    PT Duration 6 weeks    PT Treatment/Interventions ADLs/Self Care  Home Management;Electrical Stimulation;Iontophoresis 4mg /ml Dexamethasone;Gait training;Stair training;Functional mobility training;Therapeutic exercise;Balance training;Therapeutic activities;Neuromuscular re-education;Manual techniques;Orthotic Fit/Training;Patient/family education;Dry needling;Joint Manipulations;Taping    PT Next Visit Plan Review FOTO, Review/Progress HEP, anti-protation taping, manual therapy of distal LE, plantar fascia IASTM?    PT Home Exercise Plan [id number] - Plantar fascia stretch, gastroc stretch, soleus stretch, foot roller plantar massage    Consulted and Agree with Plan of Care Patient           Patient will benefit from skilled therapeutic intervention in order to improve the following deficits and impairments:  Decreased activity tolerance, Pain, Hypomobility, Decreased endurance, Difficulty walking, Increased muscle spasms  Visit Diagnosis: Pain in left foot  Pain in right foot  Stiffness of right ankle, not elsewhere classified  Stiffness of left ankle, not elsewhere classified     Problem List Patient Active Problem List   Diagnosis Date Noted   Rectal bleeding 08/19/2020   Plantar fasciitis 07/05/2020   Abnormal vaginal bleeding 04/07/2020   Microscopic hematuria 11/08/2019   Persistent proteinuria 11/08/2019   Nail discoloration 08/24/2019   Preeclampsia in postpartum period 06/06/2019   Gestational hypertension 05/29/2019   Chiari malformation type I ([place]) 11/27/2013   Common migraine 11/11/2013   Headache disorder 07/09/2013   Vitamin D deficiency 03/06/2013   GERD (gastroesophageal reflux disease) 03/06/2013   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

## 2020-08-24 NOTE — Patient Instructions (Signed)
Access Code: [email or web address] URL: [email or web address] Date: 08/24/2020 Prepared by: [employer] [employer].  Exercises Seated Plantar Fascia Stretch - 1 x daily - 7 x weekly - 3 sets - 10 reps Foot Roller Plantar Massage - 1 x daily - 7 x weekly - 3 sets - 10 reps Gastroc Stretch on Wall - 1 x daily - 7 x weekly - 3 sets - 10 reps Soleus Stretch on Wall - 1 x daily - 7 x weekly - 3 sets - 10 reps Towel Scrunches - 1 x daily - 7 x weekly - 3 sets - 10 reps

## 2020-08-25 ENCOUNTER — Encounter (HOSPITAL_COMMUNITY): Payer: Self-pay

## 2020-08-26 ENCOUNTER — Encounter: Payer: Self-pay | Admitting: Endocrinology

## 2020-08-26 ENCOUNTER — Other Ambulatory Visit: Payer: Self-pay

## 2020-08-26 ENCOUNTER — Ambulatory Visit: Payer: 59 | Admitting: Endocrinology

## 2020-08-26 VITALS — BP 122/78 | HR 75 | Ht 64.0 in | Wt 231.0 lb

## 2020-08-26 DIAGNOSIS — E270 Other adrenocortical overactivity: Secondary | ICD-10-CM | POA: Diagnosis not present

## 2020-08-26 MED ORDER — DEXAMETHASONE 1 MG PO TABS: ORAL_TABLET | ORAL | 0 refills | Status: DC

## 2020-08-26 NOTE — Progress Notes (Signed)
Subjective:    Patient ID: Janet [name], female    DOB: Mar 08, 1986, 34 y.o.   MRN: [id number]  HPI Pt is referred by Dr Moshe Cipro.  I last saw this pt in 2018  In 2012, pt had left adrenalectomy at [facility] for left adrenal adenoma causing cushing's syndrome. She was advised not to undergo right adrenalectomy. She has not recently taken any steroids.  She has no h/o pituitary disorder, skin ulcers, cataracts, PUD, osteoporosis, DM, infection, or bony fracture. pt states she feels well in general.  Past Medical History:  Diagnosis Date  . Anemia    Phreesia 08/12/2020  . 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 Carpal tunnel syndrome during pregnancy   . 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 Cushing syndrome ([place]) 2011   r/t adrenal tumor; tumor removed, disease resolved  . Cushing's syndrome ([place]) 11/27/2013  . Gastroesophageal reflux disease   . GERD (gastroesophageal reflux disease)    Phreesia 08/12/2020  . Heart murmur    Phreesia 08/12/2020  . Hematuria 09/18/2015  . Hypertension    Phreesia 08/12/2020  . Internal hemorrhoids with other complication 1/74/9449   JAN 2015 R ANT MAR 2015 R POS AND L LAT BAND   . Menstrual periods irregular 09/18/2015  . Migraine without aura, with intractable migraine, so stated, without mention of status migrainosus 11/11/2013  . Patient desires pregnancy 11/27/2013  . Preeclampsia 05/21/2019   2x/wk testing nst alt w/ bpp/dopp      Deliver @ 37wks        IOL scheduled for 8/19 @ 0830  . Pregnant 12/16/2015  . Vitamin D deficiency     Past Surgical History:  Procedure Laterality Date  . CHOLECYSTECTOMY  02/2015  . COLONOSCOPY N/A 04/19/2013   QPR:FFMBWG mucosa in the terminal ileum/Single erosion in transverse colon/RECTAL BLEEDING DUE TO Moderate sized internal hemorrhoids/ trv colon erosion, bx benign.  . ESOPHAGOGASTRODUODENOSCOPY N/A 04/19/2013   YKZ:LDJT Non-erosive gastritis/PERI-UMBILICAL PAIN DUE TO GERD,  GASTRITIS, AND CONSTIPATION. Bx with mild chronic inactive gastritis. No H.Pylori  . HEMORRHOID BANDING  2015   Dr. Gala Romney- in office banding procedure  . TONSILLECTOMY    . tumor removed left adrenal gland 01/12      Social History   Socioeconomic History  . Marital status: Single    Spouse name: Not on file  . Number of children: 1  . Years of education: BA  . Highest education level: Not on file  Occupational History    Employer: [employer]  Tobacco Use  . Smoking status: Never Smoker  . Smokeless tobacco: Never Used  Vaping Use  . Vaping Use: Never used  Substance and Sexual Activity  . Alcohol use: No  . Drug use: No  . Sexual activity: Not Currently    Birth control/protection: None  Other Topics Concern  . Not on file  Social History Narrative   Denies caffeine use    Social Determinants of Health   Financial Resource Strain:   . Difficulty of Paying Living Expenses: Not on file  Food Insecurity:   . Worried About Charity fundraiser in the Last Year: Not on file  . Ran Out of Food in the Last Year: Not on file  Transportation Needs:   . Lack of Transportation (Medical): Not on file  . Lack of Transportation (Non-Medical): Not on file  Physical Activity:   . Days of Exercise per Week: Not  on file  . Minutes of Exercise per Session: Not on file  Stress:   . Feeling of Stress : Not on file  Social Connections:   . Frequency of Communication with Friends and Family: Not on file  . Frequency of Social Gatherings with Friends and Family: Not on file  . Attends Religious Services: Not on file  . Active Member of Clubs or Organizations: Not on file  . Attends Archivist Meetings: Not on file  . Marital Status: Not on file  Intimate Partner Violence:   . Fear of Current or Ex-Partner: Not on file  . Emotionally Abused: Not on file  . Physically Abused: Not on file  . Sexually Abused: Not on file    Current Outpatient Medications on File Prior to Visit   Medication Sig Dispense Refill  . butalbital-acetaminophen-caffeine (FIORICET) 50-325-40 MG tablet Take one tablet up to two times daily , as needed, for severe headache (Patient taking differently: Take 1 tablet by mouth 2 (two) times daily as needed (severe headaches.). ) 20 tablet 0  . ergocalciferol (VITAMIN D2) 1.25 MG (50000 UT) capsule Take 1 capsule (50,000 Units total) by mouth once a week. One capsule once weekly (Patient taking differently: Take 50,000 Units by mouth every Sunday. One capsule once weekly) 12 capsule 1  . meloxicam (MOBIC) 15 MG tablet Take 1 tablet (15 mg total) by mouth daily. (Patient taking differently: Take 15 mg by mouth daily as needed (Plantar fasciitis pain.). ) 30 tablet 3  . pantoprazole (PROTONIX) 40 MG tablet Take one tablet by mouth once daily, as needed, for heartburn (Patient taking differently: Take 40 mg by mouth daily as needed (hearburn/indigestion). ) 30 tablet 0   No current facility-administered medications on file prior to visit.    Allergies  Allergen Reactions  . Methylprednisolone Shortness Of Breath, Itching and Other (See Comments)    Reaction:  Bruising   . Penicillins Anaphylaxis, Rash and Other (See Comments)    Has patient had a PCN reaction causing immediate rash, facial/tongue/throat swelling, SOB or lightheadedness with hypotension: No Has patient had a PCN reaction causing severe rash involving mucus membranes or skin necrosis: No Has patient had a PCN reaction that required hospitalization No Has patient had a PCN reaction occurring within the last 10 years: No If all of the above answers are "NO", then may proceed with Cephalosporin use.  . Latex Rash  . Neomycin Rash    Breaks out with neosporin    Family History  Problem Relation Age of Onset  . Hypertension Mother   . Hyperlipidemia Mother   . Colon polyps Mother   . Hypertension Father   . Colon polyps Father   . Hypertension Maternal Grandmother   . Diabetes  Maternal Grandmother   . Deep vein thrombosis Maternal Grandmother   . Diabetes Paternal Grandmother   . COPD Paternal Grandfather   . Heart disease Paternal Grandfather   . Colon cancer Other        maternal great uncle  . Stroke Maternal Aunt   . Migraines Maternal Aunt   . Deep vein thrombosis Sister   . Other Daughter        pulmonary valve stenosis    BP 122/78   Pulse 75   Ht 5\' 4"  (1.626 m)   Wt 231 lb (104.8 kg)   SpO2 99%   BMI 39.65 kg/m    Review of Systems denies depression.     Objective:   Physical  Exam VS: see vs page GEN: no distress HEAD: head: no deformity.   eyes: no periorbital swelling, no proptosis external nose and ears are normal NECK: supple, thyroid is not enlarged CHEST WALL: no deformity LUNGS: clear to auscultation CV: reg rate and rhythm, no murmur.  MUSCULOSKELETAL: gait is normal and steady EXTEMITIES: no deformity.  no leg edema NEURO:  cn 2-12 grossly intact.   readily moves all 4's.  sensation is intact to touch on all 4's SKIN:  Normal texture and temperature.  No rash or suspicious lesion is visible.  Terminal hair growth is noted at the face and abdomen.  No striae at the abdomen.  NODES:  None palpable at the neck PSYCH: alert, well-oriented.  Does not appear anxious nor depressed.   Lab Results  Component Value Date   TSH 1.70 03/20/2020   CT (2020): right adrenal is normal.    I have reviewed outside records, and summarized: Pt was noted to have h/o adrenal adenoma, and referred here.  Main symptom addressed was headache.  She has f/u appt with neurology      Assessment & Plan:  H/o cortisol-producing adrenal adenoma, due for recheck.    Patient Instructions  Let's recheck the 24-HR urine test.  After you finish this, you should do a "dexamethasone suppression test."  For this, you would take dexamethasone 1 mg at 10 pm (I have sent a prescription to your pharmacy), then come in for a "cortisol" blood test the next  morning before 9 am.  You do not need to be fasting for this test.  Please come back for a follow-up appointment in 6 months.

## 2020-08-26 NOTE — Progress Notes (Signed)
Cc'ed to pcp °

## 2020-08-26 NOTE — Patient Instructions (Addendum)
Let's recheck the 24-HR urine test.  After you finish this, you should do a "dexamethasone suppression test."  For this, you would take dexamethasone 1 mg at 10 pm (I have sent a prescription to your pharmacy), then come in for a "cortisol" blood test the next morning before 9 am.  You do not need to be fasting for this test.  Please come back for a follow-up appointment in 6 months.

## 2020-08-27 ENCOUNTER — Ambulatory Visit (INDEPENDENT_AMBULATORY_CARE_PROVIDER_SITE_OTHER): Payer: 59 | Admitting: Podiatry

## 2020-08-27 DIAGNOSIS — M722 Plantar fascial fibromatosis: Secondary | ICD-10-CM

## 2020-08-28 ENCOUNTER — Other Ambulatory Visit: Payer: Self-pay

## 2020-08-28 ENCOUNTER — Other Ambulatory Visit (HOSPITAL_COMMUNITY)
Admission: RE | Admit: 2020-08-28 | Discharge: 2020-08-28 | Disposition: A | Discharge status: Home / Self Care | Payer: 59 | Source: Ambulatory Visit | Attending: Internal Medicine | Admitting: Internal Medicine

## 2020-08-28 DIAGNOSIS — Z20822 Contact with and (suspected) exposure to covid-19: Secondary | ICD-10-CM | POA: Insufficient documentation

## 2020-08-28 DIAGNOSIS — Z01812 Encounter for preprocedural laboratory examination: Secondary | ICD-10-CM | POA: Insufficient documentation

## 2020-08-28 LAB — PREGNANCY, URINE: Preg Test, Ur: NEGATIVE

## 2020-08-29 LAB — SARS CORONAVIRUS 2 (TAT 6-24 HRS): SARS Coronavirus 2: NEGATIVE

## 2020-08-30 ENCOUNTER — Encounter: Payer: Self-pay | Admitting: Podiatry

## 2020-08-30 NOTE — Progress Notes (Signed)
  Subjective:  Patient ID: Janet [name], female    DOB: [birth year],  MRN: [id number]  Chief Complaint  Patient presents with  . Foot Pain      6 week follow up bilateral foot pain    34 y.o. female presents with the above complaint. History confirmed with patient.  Discharge physical therapy at [facility] outpatient.  Feels like it will help, they want to do twice weekly  Objective:  Physical Exam: warm, good capillary refill, no trophic changes or ulcerative lesions, normal DP and PT pulses and normal sensory exam. Left Foot: point tenderness over the heel pad, point tenderness of the mid plantar fascia and normal microcirculation and capillary reflow  Right Foot: point tenderness over the heel pad, point tenderness of the mid plantar fascia and normal microcirculation and capillary reflow   No images are attached to the encounter.  Radiographs: X-ray of both feet: no fracture, dislocation, swelling or degenerative changes noted, hallux valgus deformity and pes planus Assessment:   1. Plantar fasciitis of left foot   2. Plantar fasciitis of right foot      Plan:  Patient was evaluated and treated and all questions answered.      -Continue physical therapy with current outpatient.  I think this will be a significant benefit for her -She has an upcoming appoint with Velora Heckler to discuss refurbishment or rehab of her current orthotics -Today an injection was delivered to the left heel only with 4 mg of dexamethasone phosphate and 10 mg of Kenalog and 1 cc of 2% Xylocaine plain.  She tolerated procedure well.  Return in about 6 weeks (around 10/08/2020) for recheck plantar fasciitis.

## 2020-08-31 ENCOUNTER — Ambulatory Visit (HOSPITAL_COMMUNITY): Payer: 59 | Admitting: Anesthesiology

## 2020-08-31 ENCOUNTER — Encounter (HOSPITAL_COMMUNITY)
Admission: RE | Disposition: A | Discharge status: Home / Self Care | Payer: Self-pay | Source: Home / Self Care | Attending: Internal Medicine

## 2020-08-31 ENCOUNTER — Encounter (HOSPITAL_COMMUNITY): Payer: Self-pay | Admitting: *Deleted

## 2020-08-31 ENCOUNTER — Ambulatory Visit (HOSPITAL_COMMUNITY)
Admission: RE | Admit: 2020-08-31 | Discharge: 2020-08-31 | Disposition: A | Discharge status: Home / Self Care | Payer: 59 | Attending: Internal Medicine | Admitting: Internal Medicine

## 2020-08-31 ENCOUNTER — Other Ambulatory Visit: Payer: Self-pay

## 2020-08-31 DIAGNOSIS — K648 Other hemorrhoids: Secondary | ICD-10-CM | POA: Insufficient documentation

## 2020-08-31 DIAGNOSIS — Q438 Other specified congenital malformations of intestine: Secondary | ICD-10-CM | POA: Insufficient documentation

## 2020-08-31 DIAGNOSIS — Z888 Allergy status to other drugs, medicaments and biological substances status: Secondary | ICD-10-CM | POA: Insufficient documentation

## 2020-08-31 DIAGNOSIS — Z881 Allergy status to other antibiotic agents status: Secondary | ICD-10-CM | POA: Diagnosis not present

## 2020-08-31 DIAGNOSIS — K625 Hemorrhage of anus and rectum: Secondary | ICD-10-CM | POA: Diagnosis not present

## 2020-08-31 DIAGNOSIS — Z791 Long term (current) use of non-steroidal anti-inflammatories (NSAID): Secondary | ICD-10-CM | POA: Diagnosis not present

## 2020-08-31 DIAGNOSIS — Z880 Allergy status to penicillin: Secondary | ICD-10-CM | POA: Insufficient documentation

## 2020-08-31 HISTORY — PX: COLONOSCOPY WITH PROPOFOL: SHX5780

## 2020-08-31 SURGERY — COLONOSCOPY WITH PROPOFOL
Anesthesia: General

## 2020-08-31 MED ORDER — PROPOFOL 10 MG/ML IV BOLUS
INTRAVENOUS | Status: DC | PRN
  Administered 2020-08-31: 150 ug/kg/min via INTRAVENOUS
  Administered 2020-08-31: 100 mg via INTRAVENOUS

## 2020-08-31 MED ORDER — LACTATED RINGERS IV SOLN: Freq: Once | INTRAVENOUS | Status: AC

## 2020-08-31 MED ORDER — LACTATED RINGERS IV SOLN: INTRAVENOUS | Status: DC | PRN

## 2020-08-31 NOTE — Anesthesia Postprocedure Evaluation (Signed)
Anesthesia Post Note  Patient: Janet [name]  Procedure(s) Performed: COLONOSCOPY WITH PROPOFOL (N/A )  Patient location during evaluation: Endoscopy Anesthesia Type: General Level of consciousness: awake, oriented, awake and alert and patient cooperative Pain management: pain level controlled Vital Signs Assessment: post-procedure vital signs reviewed and stable Respiratory status: spontaneous breathing, respiratory function stable and nonlabored ventilation Cardiovascular status: blood pressure returned to baseline and stable Postop Assessment: no headache and no backache Anesthetic complications: no   No complications documented.   Last Vitals:  Vitals:   08/31/20 1217 08/31/20 1333  BP: 126/79 (!) 94/52  Pulse: 71 81  Resp: 14 20  Temp: 37.3 C 37.3 C  SpO2: 100% 97%    Last Pain:  Vitals:   08/31/20 1333  TempSrc: Oral  PainSc:                  Tacy Learn

## 2020-08-31 NOTE — Op Note (Signed)
[facility] Patient Name: Janet [name] Procedure Date: 08/31/2020 1:06 PM MRN: [id number] Date of Birth: January 06, 1986 Attending MD: Elon Alas. Abbey Chatters DO CSN: [id number] Age: 34 Admit Type: Outpatient Procedure:                Colonoscopy Indications:              Rectal bleeding Providers:                Elon Alas. Lynell Kussman, DO, Otis Peak B. Sharon Seller, RN,                            Nelma Rothman, Technician Referring MD:              Medicines:                See the Anesthesia note for documentation of the                            administered medications Complications:            No immediate complications. Estimated Blood Loss:     Estimated blood loss: none. Procedure:                Pre-Anesthesia Assessment:                           - The anesthesia plan was to use monitored                            anesthesia care (MAC).                           After obtaining informed consent, the colonoscope                            was passed under direct vision. Throughout the                            procedure, the patient's blood pressure, pulse, and                            oxygen saturations were monitored continuously. The                            PCF-H190DL ([id number]) scope was introduced through                            the anus and advanced to the the cecum, identified                            by appendiceal orifice and ileocecal valve. The                            colonoscopy was performed without difficulty. The                            patient tolerated the procedure well. The quality  of the bowel preparation was evaluated using the                            BBPS [facility] Bowel Preparation Scale) with scores                            of: Right Colon = 3, Transverse Colon = 3 and Left                            Colon = 3 (entire mucosa seen well with no residual                            staining, small fragments of stool or  opaque                            liquid). The total BBPS score equals 9. Scope In: 1:11:11 PM Scope Out: 1:29:45 PM Scope Withdrawal Time: 0 hours 8 minutes 9 seconds  Total Procedure Duration: 0 hours 18 minutes 34 seconds  Findings:      The perianal and digital rectal examinations were normal.      Non-bleeding internal hemorrhoids were found during retroflexion.      The colon (entire examined portion) was significantly tortuous.       Advancing the scope required applying abdominal pressure. Impression:               - Non-bleeding internal hemorrhoids.                           - Tortuous colon.                           - No specimens collected. Moderate Sedation:      Per Anesthesia Care Recommendation:           - Patient has a contact number available for                            emergencies. The signs and symptoms of potential                            delayed complications were discussed with the                            patient. Return to normal activities tomorrow.                            Written discharge instructions were provided to the                            patient.                           - Resume previous diet.                           - Continue present medications.                           -  Repeat colonoscopy at age 9 or sooner if higher                            risk for screening purposes.                           - Return to GI clinic at the next available                            appointment for hemorrhoid banding with Roseanne Kaufman. Procedure Code(s):        --- Professional ---                           [phone], Colonoscopy, flexible; diagnostic, including                            collection of specimen(s) by brushing or washing,                            when performed (separate procedure) Diagnosis Code(s):        --- Professional ---                           K64.8, Other hemorrhoids                           K62.5, Hemorrhage of  anus and rectum                           Q43.8, Other specified congenital malformations of                            intestine CPT copyright 2019 American Medical Association. All rights reserved. The codes documented in this report are preliminary and upon coder review may  be revised to meet current compliance requirements. Elon Alas. Abbey Chatters, DO [place] Abbey Chatters, DO 08/31/2020 1:32:16 PM This report has been signed electronically. Number of Addenda: 0

## 2020-08-31 NOTE — Transfer of Care (Signed)
Immediate Anesthesia Transfer of Care Note  Patient: Janet [name]  Procedure(s) Performed: COLONOSCOPY WITH PROPOFOL (N/A )  Patient Location: Endoscopy Unit  Anesthesia Type:General  Level of Consciousness: awake, alert , oriented and patient cooperative  Airway & Oxygen Therapy: Patient Spontanous Breathing  Post-op Assessment: Report given to RN, Post -op Vital signs reviewed and stable and Patient moving all extremities  Post vital signs: Reviewed and stable  Last Vitals:  Vitals Value Taken Time  BP 94/52 08/31/20 1333  Temp 37.3 C 08/31/20 1333  Pulse 81 08/31/20 1333  Resp 20 08/31/20 1333  SpO2 97 % 08/31/20 1333    Last Pain:  Vitals:   08/31/20 1333  TempSrc: Oral  PainSc:       Patients Stated Pain Goal: 10 (37/04/88 8916)  Complications: No complications documented.

## 2020-08-31 NOTE — Anesthesia Preprocedure Evaluation (Addendum)
Anesthesia Evaluation  Patient identified by MRN, date of birth, ID band Patient awake    Reviewed: Allergy & Precautions, NPO status , Patient's Chart, lab work & pertinent test results  History of Anesthesia Complications Negative for: history of anesthetic complications  Airway Mallampati: II  TM Distance: >3 FB Neck ROM: Full    Dental  (+) Dental Advisory Given, Teeth Intact   Pulmonary neg pulmonary ROS,    Pulmonary exam normal breath sounds clear to auscultation       Cardiovascular Exercise Tolerance: Good hypertension (during preganncy), Normal cardiovascular exam+ Valvular Problems/Murmurs  Rhythm:Regular Rate:Normal     Neuro/Psych  Headaches, Chiari malformation type 1   Neuromuscular disease negative psych ROS   GI/Hepatic Neg liver ROS, GERD  Medicated and Controlled,  Endo/Other  cushing's syndrome - resolved after tumor ressection  Renal/GU negative Renal ROS     Musculoskeletal negative musculoskeletal ROS (+)   Abdominal   Peds  Hematology  (+) anemia ,   Anesthesia Other Findings   Reproductive/Obstetrics negative OB ROS                            Anesthesia Physical Anesthesia Plan  ASA: II  Anesthesia Plan: General   Post-op Pain Management:    Induction: Intravenous  PONV Risk Score and Plan: TIVA  Airway Management Planned: Nasal Cannula and Natural Airway  Additional Equipment:   Intra-op Plan:   Post-operative Plan:   Informed Consent: I have reviewed the patients History and Physical, chart, labs and discussed the procedure including the risks, benefits and alternatives for the proposed anesthesia with the patient or authorized representative who has indicated his/her understanding and acceptance.     Dental advisory given  Plan Discussed with: CRNA and Surgeon  Anesthesia Plan Comments:        Anesthesia Quick Evaluation

## 2020-08-31 NOTE — Discharge Instructions (Signed)
  Colonoscopy Discharge Instructions  Read the instructions outlined below and refer to this sheet in the next few weeks. These discharge instructions provide you with general information on caring for yourself after you leave the hospital. Your doctor may also give you specific instructions. While your treatment has been planned according to the most current medical practices available, unavoidable complications occasionally occur.   ACTIVITY  You may resume your regular activity, but move at a slower pace for the next 24 hours.   Take frequent rest periods for the next 24 hours.   Walking will help get rid of the air and reduce the bloated feeling in your belly (abdomen).   No driving for 24 hours (because of the medicine (anesthesia) used during the test).    Do not sign any important legal documents or operate any machinery for 24 hours (because of the anesthesia used during the test).  NUTRITION  Drink plenty of fluids.   You may resume your normal diet as instructed by your doctor.   Begin with a light meal and progress to your normal diet. Heavy or fried foods are harder to digest and may make you feel sick to your stomach (nauseated).   Avoid alcoholic beverages for 24 hours or as instructed.  MEDICATIONS  You may resume your normal medications unless your doctor tells you otherwise.  WHAT YOU CAN EXPECT TODAY  Some feelings of bloating in the abdomen.   Passage of more gas than usual.   Spotting of blood in your stool or on the toilet paper.  IF YOU HAD POLYPS REMOVED DURING THE COLONOSCOPY:  No aspirin products for 7 days or as instructed.   No alcohol for 7 days or as instructed.   Eat a soft diet for the next 24 hours.  FINDING OUT THE RESULTS OF YOUR TEST Not all test results are available during your visit. If your test results are not back during the visit, make an appointment with your caregiver to find out the results. Do not assume everything is normal if  you have not heard from your caregiver or the medical facility. It is important for you to follow up on all of your test results.  SEEK IMMEDIATE MEDICAL ATTENTION IF:  You have more than a spotting of blood in your stool.   Your belly is swollen (abdominal distention).   You are nauseated or vomiting.   You have a temperature over 101.   You have abdominal pain or discomfort that is severe or gets worse throughout the day.   Your colonoscopy was relatively unremarkable.  I did not find any polyps or colon cancer.  I did not find any evidence of underlying inflammatory bowel disease such as Crohn's disease or ulcerative colitis.  Your colon was rather tortuous due to previous abdominal surgery and adhesions.  You do have internal hemorrhoids and I think you would be a good candidate for hemorrhoid banding with Roseanne Kaufman in the outpatient setting if you would like [place] to set this up.  Otherwise follow-up with GI as needed.  Repeat colonoscopy at age 71.  I hope you have a great rest of your week!  Elon Alas. Abbey Chatters, D.O. Gastroenterology and Hepatology [facility]

## 2020-09-08 ENCOUNTER — Other Ambulatory Visit: Payer: 59 | Admitting: Orthotics

## 2020-09-09 ENCOUNTER — Encounter (HOSPITAL_COMMUNITY): Payer: Self-pay | Admitting: Internal Medicine

## 2020-09-09 NOTE — H&P (Signed)
Primary Care Physician:  [name], MD Primary Gastroenterologist:  Dr. Abbey Chatters  Pre-Procedure History & Physical: HPI:  Janet [name] is a 34 y.o. female is here for a colonoscopy for rectal bleeding. Notes history of hemorrhoids in the past.  Patient denies any family history of colorectal cancer.  No melena.  No abdominal pain or unintentional weight loss.    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; tumor removed, disease resolved   Cushing's syndrome ([place]) 11/27/2013   Gastroesophageal reflux disease    GERD (gastroesophageal reflux disease)    Phreesia 08/12/2020   Heart murmur    Phreesia 08/12/2020   Hematuria 09/18/2015   Hypertension    Phreesia 08/12/2020   Internal hemorrhoids with other complication 1/57/2620   JAN 2015 R ANT MAR 2015 R POS AND L LAT BAND    Menstrual periods irregular 09/18/2015   Migraine without aura, with intractable migraine, so stated, without mention of status migrainosus 11/11/2013   Patient desires pregnancy 11/27/2013   Preeclampsia 05/21/2019   2x/wk testing nst alt w/ bpp/dopp      Deliver @ 37wks        IOL scheduled for 8/19 @ 0830   Pregnant 12/16/2015   Vitamin D deficiency     Past Surgical History:  Procedure Laterality Date   CHOLECYSTECTOMY  02/2015   COLONOSCOPY N/A 04/19/2013   BTD:HRCBUL mucosa in the terminal ileum/Single erosion in transverse colon/RECTAL BLEEDING DUE TO Moderate sized internal hemorrhoids/ trv colon erosion, bx benign.   ESOPHAGOGASTRODUODENOSCOPY N/A 04/19/2013   AGT:XMIW Non-erosive gastritis/PERI-UMBILICAL PAIN DUE TO GERD, GASTRITIS, AND CONSTIPATION. Bx with mild chronic inactive gastritis. No H.Pylori   HEMORRHOID BANDING  2015   Dr. Gala Romney- in  office banding procedure   TONSILLECTOMY     tumor removed left adrenal gland 01/12      Prior to Admission medications   Medication Sig Start Date End Date Taking? Authorizing Provider  ergocalciferol (VITAMIN D2) 1.25 MG (50000 UT) capsule Take 1 capsule (50,000 Units total) by mouth once a week. One capsule once weekly Patient taking differently: Take 50,000 Units by mouth every Sunday. One capsule once weekly 06/30/20  Yes [name], MD  butalbital-acetaminophen-caffeine (FIORICET) [phone] MG tablet Take one tablet up to two times daily , as needed, for severe headache Patient taking differently: Take 1 tablet by mouth 2 (two) times daily as needed (severe headaches.).  08/13/20   [name], MD  dexamethasone (DECADRON) 1 MG tablet Take at 9-10 PM, the night before blood test 08/26/20   Renato Shin, MD  meloxicam (MOBIC) 15 MG tablet Take 1 tablet (15 mg total) by mouth daily. Patient taking differently: Take 15 mg by mouth daily as needed (Plantar fasciitis pain.).  06/11/20   McDonald, Stephan Minister, DPM  pantoprazole (PROTONIX) 40 MG tablet Take one tablet by mouth once daily, as needed, for heartburn Patient taking differently: Take 40 mg by mouth daily as needed (hearburn/indigestion).  06/30/20   [name], MD    Allergies as of 08/19/2020 - Review Complete 08/19/2020  Allergen Reaction Noted   Methylprednisolone Shortness Of Breath, Itching, and Other (See Comments) 12/16/2015   Penicillins Anaphylaxis, Rash, and Other (  See Comments)    Latex Rash 01/11/2011   Neomycin Rash 12/22/2017    Family History  Problem Relation Age of Onset   Hypertension Mother    Hyperlipidemia Mother    Colon polyps Mother    Hypertension Father    Colon polyps Father    Hypertension Maternal Grandmother    Diabetes Maternal Grandmother    Deep vein thrombosis Maternal Grandmother    Diabetes Paternal Grandmother    COPD Paternal Grandfather    Heart  disease Paternal Grandfather    Colon cancer Other        maternal great uncle   Stroke Maternal Aunt    Migraines Maternal Aunt    Deep vein thrombosis Sister    Other Daughter        pulmonary valve stenosis    Social History   Socioeconomic History   Marital status: Single    Spouse name: Not on file   Number of children: 1   Years of education: BA   Highest education level: Not on file  Occupational History    Employer: [employer]  Tobacco Use   Smoking status: Never Smoker   Smokeless tobacco: Never Used  Scientific laboratory technician Use: Never used  Substance and Sexual Activity   Alcohol use: No   Drug use: No   Sexual activity: Not Currently    Birth control/protection: None  Other Topics Concern   Not on file  Social History Narrative   Denies caffeine use    Social Determinants of Health   Financial Resource Strain:    Difficulty of Paying Living Expenses: Not on file  Food Insecurity:    Worried About Charity fundraiser in the Last Year: Not on file   [employer] of Food in the Last Year: Not on file  Transportation Needs:    Lack of Transportation (Medical): Not on file   Lack of Transportation (Non-Medical): Not on file  Physical Activity:    Days of Exercise per Week: Not on file   Minutes of Exercise per Session: Not on file  Stress:    Feeling of Stress : Not on file  Social Connections:    Frequency of Communication with Friends and Family: Not on file   Frequency of Social Gatherings with Friends and Family: Not on file   Attends Religious Services: Not on file   Active Member of Clubs or Organizations: Not on file   Attends Archivist Meetings: Not on file   Marital Status: Not on file  Intimate Partner Violence:    Fear of Current or Ex-Partner: Not on file   Emotionally Abused: Not on file   Physically Abused: Not on file   Sexually Abused: Not on file    Review of Systems: See HPI, otherwise negative  ROS  Impression/Plan: Janet [name] is here for a colonoscopy to be performed for rectal bleeding.   The risks of the procedure including infection, bleed, or perforation as well as benefits, limitations, alternatives and imponderables have been reviewed with the patient. Questions have been answered. All parties agreeable.

## 2020-09-14 ENCOUNTER — Other Ambulatory Visit (INDEPENDENT_AMBULATORY_CARE_PROVIDER_SITE_OTHER): Payer: 59

## 2020-09-14 ENCOUNTER — Other Ambulatory Visit: Payer: Self-pay

## 2020-09-14 DIAGNOSIS — E270 Other adrenocortical overactivity: Secondary | ICD-10-CM

## 2020-09-15 ENCOUNTER — Ambulatory Visit: Payer: 59 | Admitting: Physical Therapy

## 2020-09-16 ENCOUNTER — Ambulatory Visit: Payer: 59 | Admitting: Physical Therapy

## 2020-09-18 LAB — CORTISOL, URINE, 24 HOUR
24 Hour urine volume (VMAHVA): 600 mL
CREATININE, URINE: 1.36 g/(24.h) (ref 0.50–2.15)
Cortisol (Ur), Free: 8.2 mcg/24 h (ref 4.0–50.0)

## 2020-09-22 ENCOUNTER — Encounter: Payer: Self-pay | Admitting: Physical Therapy

## 2020-09-22 ENCOUNTER — Ambulatory Visit: Payer: 59 | Attending: Podiatry | Admitting: Physical Therapy

## 2020-09-22 ENCOUNTER — Other Ambulatory Visit: Payer: Self-pay

## 2020-09-22 DIAGNOSIS — M79671 Pain in right foot: Secondary | ICD-10-CM

## 2020-09-22 DIAGNOSIS — M79672 Pain in left foot: Secondary | ICD-10-CM | POA: Diagnosis not present

## 2020-09-22 DIAGNOSIS — M25672 Stiffness of left ankle, not elsewhere classified: Secondary | ICD-10-CM

## 2020-09-22 DIAGNOSIS — M25671 Stiffness of right ankle, not elsewhere classified: Secondary | ICD-10-CM

## 2020-09-23 ENCOUNTER — Encounter: Payer: Self-pay | Admitting: Physical Therapy

## 2020-09-23 NOTE — Therapy (Signed)
[place], [place], [address] Phone: [phone]   Fax:  [phone]  Physical Therapy Treatment  Patient Details  Name: Janet [name] MRN: [id number] Date of Birth: 1986/02/20 Referring Provider (PT): Criselda Peaches, [place]    Encounter Date: 09/22/2020   PT End of Session - 09/22/20 1506    Visit Number 2    Number of Visits 13    Date for PT Re-Evaluation 10/05/20    Authorization Type [employer] - FOTO at 6th & 10th    PT Start Time 0215    PT Stop Time 0258    PT Time Calculation (min) 43 min    Activity Tolerance Patient tolerated treatment well    Behavior During Therapy [facility] for tasks assessed/performed           Past Medical History:  Diagnosis Date  . Anemia    Phreesia 08/12/2020  . 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 Carpal tunnel syndrome during pregnancy   . 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 Cushing's syndrome ([place]) 11/27/2013  . Gastroesophageal reflux disease   . GERD (gastroesophageal reflux disease)    Phreesia 08/12/2020  . Heart murmur    Phreesia 08/12/2020  . Hematuria 09/18/2015  . Hypertension    Phreesia 08/12/2020  . Internal hemorrhoids with other complication 06/11/4096   JAN 2015 R ANT MAR 2015 R POS AND L LAT BAND   . Menstrual periods irregular 09/18/2015  . Migraine without aura, with intractable migraine, so stated, without mention of status migrainosus 11/11/2013  . Patient desires pregnancy 11/27/2013  . Preeclampsia 05/21/2019   2x/wk testing nst alt w/ bpp/dopp      Deliver @ 37wks        IOL scheduled for 8/19 @ 0830  . Pregnant 12/16/2015  . Vitamin D deficiency     Past Surgical History:  Procedure Laterality Date  . CHOLECYSTECTOMY  02/2015  . COLONOSCOPY N/A 04/19/2013   DZH:GDJMEQ mucosa in the terminal ileum/Single erosion in  transverse colon/RECTAL BLEEDING DUE TO Moderate sized internal hemorrhoids/ trv colon erosion, bx benign.  . COLONOSCOPY WITH PROPOFOL N/A 08/31/2020   Procedure: COLONOSCOPY WITH PROPOFOL;  Surgeon: Eloise Harman, DO;  Location: [facility];  Service: Endoscopy;  Laterality: N/A;  1:30pm  . ESOPHAGOGASTRODUODENOSCOPY N/A 04/19/2013   AST:MHDQ Non-erosive gastritis/PERI-UMBILICAL PAIN DUE TO GERD, GASTRITIS, AND CONSTIPATION. Bx with mild chronic inactive gastritis. No H.Pylori  . HEMORRHOID BANDING  2015   Dr. Gala Romney- in office banding procedure  . TONSILLECTOMY    . 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   Subjective Assessment - 09/22/20 1419    Subjective Patient continues to have pain in her foot. The exercise that has helped the most is using the water bottle. It has been about a month since sahe has been to therapy.    Limitations Walking;Standing    How long can you sit comfortably? unlimited    How long can you stand comfortably? Unlimited with pain    How long can you walk comfortably? unlimited with pain    Patient Stated Goals Pain relief    Currently in Pain? Yes    Pain Score 5     Pain Orientation Right;Left    Pain  Type Chronic pain    Pain Onset More than a month ago    Pain Frequency Intermittent    Aggravating Factors  worse whenwalking    Effect of Pain on Daily Activities hurts while standing at work                             [facility] Adult PT Treatment/Exercise - 09/23/20 0001      Self-Care   Self-Care Other Self-Care Comments    Other Self-Care Comments  reviewed how to perfrom self sof tissue mobilization to the calf and foot      Manual Therapy   Manual Therapy Soft tissue mobilization;Myofascial release;Passive ROM    Soft tissue mobilization TFM to bilateral plantar facia; trigger point release to bilateral calfs in muliple areas. Multiple trigger points noted in her calf. Reviewed  with patient how to find them;    Passive ROM gentle into dorsi flexion      Ankle Exercises: Stretches   Gastroc Stretch Limitations self gastroc stretch with strap low 3x20 sec hold with cuing for force      Ankle Exercises: Supine   T-Band PF x20 yellow; Inversion x20 yellow                  PT Education - 09/22/20 1505    Education Details reviewed self stretching for home    Person(s) Educated Patient    Methods Explanation;Demonstration;Tactile cues;Verbal cues    Comprehension Verbalized understanding;Returned demonstration;Verbal cues required;Tactile cues required            PT Short Term Goals - 08/24/20 1658      PT SHORT TERM GOAL #1   Title Pt will be IND with HEP    Time 3    Period Weeks    Status New    Target Date 09/14/20      PT SHORT TERM GOAL #2   Title Pt will report ability to walk approximately 60 min with pain report </=3/10 to be able to walk with their family    Time 3    Period Weeks    Status New    Target Date 09/14/20      PT SHORT TERM GOAL #3   Title Pt will increase bilateral ankle DF AROM by 3 degrees to improve functional walking ability for work    Baseline 0    Time 3    Period Weeks    Status New    Target Date 09/14/20             PT Long Term Goals - 08/24/20 1700      PT LONG TERM GOAL #1   Title Pt will be IND with all given HEP to promote functional independence and ability to maintain quality of life.    Time 6    Period Weeks    Status New    Target Date 10/05/20      PT LONG TERM GOAL #2   Title Pt will decrease FOTO foot limitation from 20% to 11% to improve perception of functional performance and personal agency.    Time 6    Period Weeks    Status New    Target Date 10/05/20      PT LONG TERM GOAL #3   Title Pt will report ability to get up out of bed/chair with pain report </= 3/10 to improve quality of life and increase ambulation time/distance  Time 6    Period Weeks    Status New     Target Date 10/05/20      PT LONG TERM GOAL #4   Title Pt will maintain current strength level with no pain report for increased ambulation time and work performance.    Time 6    Period Weeks    Status New    Target Date 10/05/20      PT LONG TERM GOAL #5   Title Pt will report ability to perform work tasks with no limitation or pain reported for quality of life    Time 6    Period Weeks    Status New    Target Date 10/05/20      Additional Long Term Goals   Additional Long Term Goals Yes      PT LONG TERM GOAL #6   Title Pt will improve bilateral ankle DF to >/=8 with no report of heel pain to improve functional mobility    Time 6    Period Weeks    Status New    Target Date 10/05/20                 Plan - 09/22/20 1508    Clinical Impression Statement Patient had tightness and triggerpoints in her calf. She was shown how to do self trigger point release at home and self transverse friction on her plantar facia at home. She tolerated well. she is still limited in Dorsi flexion. She was given a gentl strap stretch as the gastroc stretching. She perfromed light ther-ex with a yellow band. She was more TTP in her riht plantar facia but her left calf. We will progress her into standing exercises as tolerated.    Personal Factors and Comorbidities Profession    Examination-Participation Restrictions Occupation    Stability/Clinical Decision Making Stable/Uncomplicated    Clinical Decision Making Low    Rehab Potential Good    PT Frequency 2x / week    PT Duration 6 weeks    PT Treatment/Interventions ADLs/Self Care Home Management;Electrical Stimulation;Iontophoresis 4mg /ml Dexamethasone;Gait training;Stair training;Functional mobility training;Therapeutic exercise;Balance training;Therapeutic activities;Neuromuscular re-education;Manual techniques;Orthotic Fit/Training;Patient/family education;Dry needling;Joint Manipulations;Taping    PT Next Visit Plan Review FOTO,  Review/Progress HEP, anti-protation taping, manual therapy of distal LE, plantar fascia IASTM?    PT Home Exercise Plan [id number] - Plantar fascia stretch, gastroc stretch, soleus stretch, foot roller plantar massage    Consulted and Agree with Plan of Care Patient           Patient will benefit from skilled therapeutic intervention in order to improve the following deficits and impairments:  Decreased activity tolerance,Pain,Hypomobility,Decreased endurance,Difficulty walking,Increased muscle spasms  Visit Diagnosis: Pain in left foot  Pain in right foot  Stiffness of right ankle, not elsewhere classified  Stiffness of left ankle, not elsewhere classified     Problem List Patient Active Problem List   Diagnosis Date Noted  . Rectal bleeding 08/19/2020  . Plantar fasciitis 07/05/2020  . Abnormal vaginal bleeding 04/07/2020  . Microscopic hematuria 11/08/2019  . Persistent proteinuria 11/08/2019  . Nail discoloration 08/24/2019  . Preeclampsia in postpartum period 06/06/2019  . Gestational hypertension 05/29/2019  . Chiari malformation type I ([place]) 11/27/2013  . Common migraine 11/11/2013  . Headache disorder 07/09/2013  . Vitamin D deficiency 03/06/2013  . GERD (gastroesophageal reflux disease) 03/06/2013  . 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 Morbid obesity ([place]) 07/07/2010  . SINUS ARRHYTHMIA 07/07/2010  . ANEMIA 06/23/2010    Shanon Brow  Robb Matar 09/23/2020, 1:02 PM  [facility] [address] [place], [place], [address] Phone: [phone]   Fax:  [phone]  Name: Janet [name] MRN: [id number] Date of Birth: 09/23/1986

## 2020-09-24 ENCOUNTER — Ambulatory Visit: Payer: 59 | Admitting: Physical Therapy

## 2020-09-24 ENCOUNTER — Encounter: Payer: Self-pay | Admitting: Physical Therapy

## 2020-09-24 ENCOUNTER — Other Ambulatory Visit: Payer: Self-pay

## 2020-09-24 DIAGNOSIS — M25672 Stiffness of left ankle, not elsewhere classified: Secondary | ICD-10-CM

## 2020-09-24 DIAGNOSIS — M25671 Stiffness of right ankle, not elsewhere classified: Secondary | ICD-10-CM

## 2020-09-24 DIAGNOSIS — M79672 Pain in left foot: Secondary | ICD-10-CM | POA: Diagnosis not present

## 2020-09-24 DIAGNOSIS — M79671 Pain in right foot: Secondary | ICD-10-CM

## 2020-09-25 NOTE — Patient Instructions (Signed)
Access Code: [email or web address] URL: [email or web address] Date: 09/25/2020 Prepared by: Carolyne Littles  Exercises .Standing March with Counter Support - 2 x daily - 7 x weekly - 1 sets - 10 reps .Single Knee to Chest Stretch  - 1 x daily - 7 x weekly - 1 sets - 3 reps - 20 hold

## 2020-09-25 NOTE — Therapy (Signed)
[place], [place], [address] Phone: [phone]   Fax:  [phone]  Physical Therapy Treatment  Patient Details  Name: Janet [name] MRN: [id number] Date of Birth: [birth year] Referring Provider (PT): Criselda Peaches, [place]    Encounter Date: 09/24/2020   PT End of Session - 09/25/20 1221    Visit Number 3    Number of Visits 13    Date for PT Re-Evaluation 10/05/20    Authorization Type Aetna - FOTO at 6th & 10th    PT Start Time 1330    PT Stop Time 1410    PT Time Calculation (min) 40 min    Activity Tolerance Patient tolerated treatment well    Behavior During Therapy [facility] for tasks assessed/performed           Past Medical History:  Diagnosis Date  . Anemia    Phreesia 08/12/2020  . 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 Carpal tunnel syndrome during pregnancy   . Chiari malformation type I ([place]) 2015   on MRI  . 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 Cushing's syndrome ([place]) 11/27/2013  . Gastroesophageal reflux disease   . GERD (gastroesophageal reflux disease)    Phreesia 08/12/2020  . Heart murmur    Phreesia 08/12/2020  . Hematuria 09/18/2015  . Hypertension    Phreesia 08/12/2020  . Internal hemorrhoids with other complication 01/09/271   JAN 2015 R ANT MAR 2015 R POS AND L LAT BAND   . Menstrual periods irregular 09/18/2015  . Migraine without aura, with intractable migraine, so stated, without mention of status migrainosus 11/11/2013  . Patient desires pregnancy 11/27/2013  . Preeclampsia 05/21/2019   2x/wk testing nst alt w/ bpp/dopp      Deliver @ 37wks        IOL scheduled for 8/19 @ 0830  . Pregnant 12/16/2015  . Vitamin D deficiency     Past Surgical History:  Procedure Laterality Date  . CHOLECYSTECTOMY  02/2015  . COLONOSCOPY N/A 04/19/2013   [id number]:[id number] mucosa in the terminal ileum/Single erosion in  transverse colon/RECTAL BLEEDING DUE TO Moderate sized internal hemorrhoids/ trv colon erosion, bx benign.  . COLONOSCOPY WITH PROPOFOL N/A 08/31/2020   Procedure: COLONOSCOPY WITH PROPOFOL;  Surgeon: Eloise Harman, DO;  [facility];  Service: Endoscopy;  Laterality: N/A;  1:30pm  . ESOPHAGOGASTRODUODENOSCOPY N/A 04/19/2013   VQQ:VZDG Non-erosive gastritis/PERI-UMBILICAL PAIN DUE TO GERD, GASTRITIS, AND CONSTIPATION. Bx with mild chronic inactive gastritis. No H.Pylori  . HEMORRHOID BANDING  2015   Dr. Gala Romney- in office banding procedure  . TONSILLECTOMY    . 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   Subjective Assessment - 09/24/20 1335    Subjective Patient reports her calves were sore after the last visit but they are better today. She is not having pain today. She had soem pain last night but used here stretches and now she is doing better.    Limitations Walking;Standing    How long can you sit comfortably? unlimited    How long can you stand comfortably? Unlimited with pain    How long can you walk comfortably? unlimited with pain    Currently in Pain? No/denies  [facility] Adult PT Treatment/Exercise - 09/25/20 0001      Self-Care   Self-Care Other Self-Care Comments    Other Self-Care Comments  reviewed how to perfrom self sof tissue mobilization to the calf and foot      Manual Therapy   Manual Therapy Soft tissue mobilization;Myofascial release;Passive ROM    Soft tissue mobilization TFM to bilateral plantar facia; trigger point release to bilateral calfs in muliple areas. Multiple trigger points noted in her calf. Reviewed with patient how to find them;    Passive ROM gentle into dorsi flexion      Ankle Exercises: Stretches   Gastroc Stretch Limitations self gastroc stretch with strap low 3x20 sec hold with cuing for force      Ankle Exercises: Supine   T-Band PF x20  red; Inversion x20 yellow      Ankle Exercises: Standing   Other Standing Ankle Exercises weight shifting in pain free range 2x10; slow march 2x10 with limited UE support                  PT Education - 09/24/20 1336    Education Details HEP and symptom mangement    Person(s) Educated Patient    Methods Explanation;Demonstration;Verbal cues;Tactile cues    Comprehension Returned demonstration;Verbal cues required;Tactile cues required;Verbalized understanding            PT Short Term Goals - 08/24/20 1658      PT SHORT TERM GOAL #1   Title Pt will be IND with HEP    Time 3    Period Weeks    Status New    Target Date 09/14/20      PT SHORT TERM GOAL #2   Title Pt will report ability to walk approximately 60 min with pain report </=3/10 to be able to walk with their family    Time 3    Period Weeks    Status New    Target Date 09/14/20      PT SHORT TERM GOAL #3   Title Pt will increase bilateral ankle DF AROM by 3 degrees to improve functional walking ability for work    Baseline 0    Time 3    Period Weeks    Status New    Target Date 09/14/20             PT Long Term Goals - 08/24/20 1700      PT LONG TERM GOAL #1   Title Pt will be IND with all given HEP to promote functional independence and ability to maintain quality of life.    Time 6    Period Weeks    Status New    Target Date 10/05/20      PT LONG TERM GOAL #2   Title Pt will decrease FOTO foot limitation from 20% to 11% to improve perception of functional performance and personal agency.    Time 6    Period Weeks    Status New    Target Date 10/05/20      PT LONG TERM GOAL #3   Title Pt will report ability to get up out of bed/chair with pain report </= 3/10 to improve quality of life and increase ambulation time/distance    Time 6    Period Weeks    Status New    Target Date 10/05/20      PT LONG TERM GOAL #4   Title Pt will maintain current strength level with no pain report  for increased ambulation time and work performance.    Time 6    Period Weeks    Status New    Target Date 10/05/20      PT LONG TERM GOAL #5   Title Pt will report ability to perform work tasks with no limitation or pain reported for quality of life    Time 6    Period Weeks    Status New    Target Date 10/05/20      Additional Long Term Goals   Additional Long Term Goals Yes      PT LONG TERM GOAL #6   Title Pt will improve bilateral ankle DF to >/=8 with no report of heel pain to improve functional mobility    Time 6    Period Weeks    Status New    Target Date 10/05/20                 Plan - 09/25/20 1224    Clinical Impression Statement Improved pain in her calf and foot today. She did less standing at work today though. she has been working on her exercises at home and feels they are helping. Therapy advanced her back today. We also added standing stability exercises. She was advised to continue with her HEP    Personal Factors and Comorbidities Profession    Examination-Participation Restrictions Occupation    Stability/Clinical Decision Making Stable/Uncomplicated    Clinical Decision Making Low    Rehab Potential Good    PT Frequency 2x / week    PT Duration 6 weeks    PT Treatment/Interventions ADLs/Self Care Home Management;Electrical Stimulation;Iontophoresis 4mg /ml Dexamethasone;Gait training;Stair training;Functional mobility training;Therapeutic exercise;Balance training;Therapeutic activities;Neuromuscular re-education;Manual techniques;Orthotic Fit/Training;Patient/family education;Dry needling;Joint Manipulations;Taping    PT Next Visit Plan Review FOTO, Review/Progress HEP, anti-protation taping, manual therapy of distal LE, plantar fascia IASTM?    PT Home Exercise Plan [id number] - Plantar fascia stretch, gastroc stretch, soleus stretch, foot roller plantar massage    Consulted and Agree with Plan of Care Patient           Patient will benefit from  skilled therapeutic intervention in order to improve the following deficits and impairments:  Decreased activity tolerance,Pain,Hypomobility,Decreased endurance,Difficulty walking,Increased muscle spasms  Visit Diagnosis: Pain in left foot  Pain in right foot  Stiffness of right ankle, not elsewhere classified  Stiffness of left ankle, not elsewhere classified     Problem List Patient Active Problem List   Diagnosis Date Noted  . Rectal bleeding 08/19/2020  . Plantar fasciitis 07/05/2020  . Abnormal vaginal bleeding 04/07/2020  . Microscopic hematuria 11/08/2019  . Persistent proteinuria 11/08/2019  . Nail discoloration 08/24/2019  . Preeclampsia in postpartum period 06/06/2019  . Gestational hypertension 05/29/2019  . Chiari malformation type I ([place]) 11/27/2013  . Common migraine 11/11/2013  . Headache disorder 07/09/2013  . Vitamin D deficiency 03/06/2013  . GERD (gastroesophageal reflux disease) 03/06/2013  . 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 Morbid obesity ([place]) 07/07/2010  . SINUS ARRHYTHMIA 07/07/2010  . ANEMIA 06/23/2010    Carney Living 09/25/2020, 12:27 PM  [facility] [address] [place], [place], [address] Phone: [phone]   Fax:  [phone]  Name: MARLIES LIGMAN MRN: [id number] Date of Birth: [birth year]

## 2020-09-29 ENCOUNTER — Encounter: Payer: Self-pay | Admitting: Physical Therapy

## 2020-09-29 ENCOUNTER — Other Ambulatory Visit: Payer: Self-pay

## 2020-09-29 ENCOUNTER — Ambulatory Visit: Payer: 59 | Admitting: Physical Therapy

## 2020-09-29 DIAGNOSIS — M79672 Pain in left foot: Secondary | ICD-10-CM | POA: Diagnosis not present

## 2020-09-29 DIAGNOSIS — M25672 Stiffness of left ankle, not elsewhere classified: Secondary | ICD-10-CM

## 2020-09-29 DIAGNOSIS — M25671 Stiffness of right ankle, not elsewhere classified: Secondary | ICD-10-CM

## 2020-09-29 DIAGNOSIS — M79671 Pain in right foot: Secondary | ICD-10-CM

## 2020-09-30 NOTE — Therapy (Signed)
[place], [place], [address] Phone: [phone]   Fax:  [phone]  Physical Therapy Treatment  Patient Details  Name: Janet [name] MRN: [id number] Date of Birth: 02-Sep-1986 Referring Provider (PT): Criselda Peaches, [place]    Encounter Date: 09/29/2020   PT End of Session - 09/30/20 1729    Visit Number 4    Number of Visits 13    Date for PT Re-Evaluation 10/05/20    Authorization Type Aetna - FOTO at 6th & 10th    PT Start Time 1330    PT Stop Time 1412    PT Time Calculation (min) 42 min    Equipment Utilized During Treatment Gait belt    Activity Tolerance Patient tolerated treatment well    Behavior During Therapy [facility] for tasks assessed/performed           Past Medical History:  Diagnosis Date   Anemia    Phreesia 08/12/2020   Borderline diabetes 2011   r/t adrenal tumor, now resolved   Carpal tunnel syndrome during pregnancy    Chiari malformation type I ([place]) 2015   on MRI   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; tumor removed, disease resolved   Cushing's syndrome ([place]) 11/27/2013   Gastroesophageal reflux disease    GERD (gastroesophageal reflux disease)    Phreesia 08/12/2020   Heart murmur    Phreesia 08/12/2020   Hematuria 09/18/2015   Hypertension    Phreesia 08/12/2020   Internal hemorrhoids with other complication [id number]   JAN 2015 R ANT MAR 2015 R POS AND L LAT BAND    Menstrual periods irregular 09/18/2015   Migraine without aura, with intractable migraine, so stated, without mention of status migrainosus 11/11/2013   Patient desires pregnancy 11/27/2013   Preeclampsia 05/21/2019   2x/wk testing nst alt w/ bpp/dopp      Deliver @ 37wks        IOL scheduled for 8/19 @ 0830   Pregnant 12/16/2015   Vitamin D deficiency     Past Surgical History:  Procedure Laterality Date   CHOLECYSTECTOMY  02/2015   COLONOSCOPY N/A 04/19/2013   [id number]  mucosa in the terminal ileum/Single erosion in transverse colon/RECTAL BLEEDING DUE TO Moderate sized internal hemorrhoids/ trv colon erosion, bx benign.   COLONOSCOPY WITH PROPOFOL N/A 08/31/2020   Procedure: COLONOSCOPY WITH PROPOFOL;  Surgeon: Eloise Harman, DO;  [facility];  Service: Endoscopy;  Laterality: N/A;  1:30pm   ESOPHAGOGASTRODUODENOSCOPY N/A 04/19/2013   [id number] Non-erosive gastritis/PERI-UMBILICAL PAIN DUE TO GERD, GASTRITIS, AND CONSTIPATION. Bx with mild chronic inactive gastritis. No H.Pylori   HEMORRHOID BANDING  2015   Dr. Gala Romney- in office banding procedure   TONSILLECTOMY     tumor removed left adrenal gland 01/12      There were no vitals filed for this visit.   Subjective Assessment - 09/29/20 1335    Subjective Patients feet have been sore over the past few days. She had to stand more at work.    Limitations Walking;Standing    How long can you sit comfortably? unlimited    How long can you stand comfortably? Unlimited with pain    How long can you walk comfortably? unlimited with pain    Patient Stated Goals Pain relief    Currently in Pain? Yes    Pain Score 7     Pain Location Heel    Pain Orientation Right;Left    Pain  Descriptors / Indicators Aching    Pain Type Chronic pain    Pain Onset More than a month ago    Pain Frequency Intermittent    Aggravating Factors  worese when walking    Pain Relieving Factors stretches work at times    Effect of Pain on Daily Activities hurts                             [facility] Adult PT Treatment/Exercise - 09/30/20 0001      Manual Therapy   Manual Therapy Soft tissue mobilization;Myofascial release;Passive ROM    Soft tissue mobilization TFM to bilateral plantar facia; trigger point release to bilateral calfs in muliple areas. Multiple trigger points noted in her calf. Reviewed with patient how to find them;    Passive ROM gentle into dorsi flexion      Ankle Exercises:  Supine   T-Band PF x20; Inversion x20                  PT Education - 09/30/20 1728    Education Details reviewed home stretching and soft tissue mobilization; benefits of taping    Person(s) Educated Patient    Methods Demonstration;Tactile cues;Explanation;Verbal cues    Comprehension Verbalized understanding;Returned demonstration;Verbal cues required;Tactile cues required            PT Short Term Goals - 08/24/20 1658      PT SHORT TERM GOAL #1   Title Pt will be IND with HEP    Time 3    Period Weeks    Status New    Target Date 09/14/20      PT SHORT TERM GOAL #2   Title Pt will report ability to walk approximately 60 min with pain report </=3/10 to be able to walk with their family    Time 3    Period Weeks    Status New    Target Date 09/14/20      PT SHORT TERM GOAL #3   Title Pt will increase bilateral ankle DF AROM by 3 degrees to improve functional walking ability for work    Baseline 0    Time 3    Period Weeks    Status New    Target Date 09/14/20             PT Long Term Goals - 08/24/20 1700      PT LONG TERM GOAL #1   Title Pt will be IND with all given HEP to promote functional independence and ability to maintain quality of life.    Time 6    Period Weeks    Status New    Target Date 10/05/20      PT LONG TERM GOAL #2   Title Pt will decrease FOTO foot limitation from 20% to 11% to improve perception of functional performance and personal agency.    Time 6    Period Weeks    Status New    Target Date 10/05/20      PT LONG TERM GOAL #3   Title Pt will report ability to get up out of bed/chair with pain report </= 3/10 to improve quality of life and increase ambulation time/distance    Time 6    Period Weeks    Status New    Target Date 10/05/20      PT LONG TERM GOAL #4   Title Pt will maintain current strength level with no  pain report for increased ambulation time and work performance.    Time 6    Period Weeks     Status New    Target Date 10/05/20      PT LONG TERM GOAL #5   Title Pt will report ability to perform work tasks with no limitation or pain reported for quality of life    Time 6    Period Weeks    Status New    Target Date 10/05/20      Additional Long Term Goals   Additional Long Term Goals Yes      PT LONG TERM GOAL #6   Title Pt will improve bilateral ankle DF to >/=8 with no report of heel pain to improve functional mobility    Time 6    Period Weeks    Status New    Target Date 10/05/20                 Plan - 09/29/20 1426    Clinical Impression Statement Patients symptoms were flaired up today. She was having increased pain upojn enetering 2nd to standing at work all day. She had been controlling her symptoms well. She had increased trigger points in both claves and had increased tenderness. She was advised that on days like these to work more on things to bring the pain down, then to exercise when the pain is not as bad.    Personal Factors and Comorbidities Profession    Examination-Participation Restrictions Occupation    Stability/Clinical Decision Making Stable/Uncomplicated    Clinical Decision Making Low    Rehab Potential Good    PT Frequency 2x / week    PT Duration 6 weeks    PT Treatment/Interventions ADLs/Self Care Home Management;Electrical Stimulation;Iontophoresis 4mg /ml Dexamethasone;Gait training;Stair training;Functional mobility training;Therapeutic exercise;Balance training;Therapeutic activities;Neuromuscular re-education;Manual techniques;Orthotic Fit/Training;Patient/family education;Dry needling;Joint Manipulations;Taping    PT Next Visit Plan Review FOTO, Review/Progress HEP, anti-protation taping, manual therapy of distal LE, plantar fascia IASTM?    PT Home Exercise Plan [id number] - Plantar fascia stretch, gastroc stretch, soleus stretch, foot roller plantar massage    Consulted and Agree with Plan of Care Patient           Patient will  benefit from skilled therapeutic intervention in order to improve the following deficits and impairments:  Decreased activity tolerance,Pain,Hypomobility,Decreased endurance,Difficulty walking,Increased muscle spasms  Visit Diagnosis: Pain in left foot  Pain in right foot  Stiffness of right ankle, not elsewhere classified  Stiffness of left ankle, not elsewhere classified     Problem List Patient Active Problem List   Diagnosis Date Noted   Rectal bleeding 08/19/2020   Plantar fasciitis 07/05/2020   Abnormal vaginal bleeding 04/07/2020   Microscopic hematuria 11/08/2019   Persistent proteinuria 11/08/2019   Nail discoloration 08/24/2019   Preeclampsia in postpartum period 06/06/2019   Gestational hypertension 05/29/2019   Chiari malformation type I ([place]) 11/27/2013   Common migraine 11/11/2013   Headache disorder 07/09/2013   Vitamin D deficiency 03/06/2013   GERD (gastroesophageal reflux disease) 03/06/2013   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

## 2020-10-01 ENCOUNTER — Encounter: Payer: 59 | Admitting: Physical Therapy

## 2020-10-06 ENCOUNTER — Ambulatory Visit: Payer: 59 | Admitting: Physical Therapy

## 2020-10-06 ENCOUNTER — Encounter: Payer: Self-pay | Admitting: Physical Therapy

## 2020-10-06 ENCOUNTER — Other Ambulatory Visit: Payer: Self-pay

## 2020-10-06 DIAGNOSIS — M79671 Pain in right foot: Secondary | ICD-10-CM

## 2020-10-06 DIAGNOSIS — M25672 Stiffness of left ankle, not elsewhere classified: Secondary | ICD-10-CM

## 2020-10-06 DIAGNOSIS — M25671 Stiffness of right ankle, not elsewhere classified: Secondary | ICD-10-CM

## 2020-10-06 DIAGNOSIS — M79672 Pain in left foot: Secondary | ICD-10-CM | POA: Diagnosis not present

## 2020-10-07 ENCOUNTER — Encounter: Payer: Self-pay | Admitting: Physical Therapy

## 2020-10-07 NOTE — Therapy (Signed)
[facility] [address] [place], [place], [address] Phone: [phone]   Fax:  [phone]  Physical Therapy Treatment  Patient Details  Name: Janet [name] MRN: [id number] Date of Birth: [birth year]  Referring Provider (PT): Edwin Cap, [place]    Encounter Date: 10/06/2020   PT End of Session - 10/06/20 1337    Visit Number 5    Number of Visits 13    Date for PT Re-Evaluation 11/04/20    Authorization Type Aetna - FOTO at 6th & 10th    PT Start Time 1330    PT Stop Time 1410    PT Time Calculation (min) 40 min    Activity Tolerance Patient tolerated treatment well    Behavior During Therapy [facility] for tasks assessed/performed           Past Medical History:  Diagnosis Date  . Anemia    Phreesia 08/12/2020  . 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 Carpal tunnel syndrome during pregnancy   . Chiari malformation type I (HCC) 2015   on MRI  . 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 Cushing's syndrome (HCC) 11/27/2013  . Gastroesophageal reflux disease   . GERD (gastroesophageal reflux disease)    Phreesia 08/12/2020  . Heart murmur    Phreesia 08/12/2020  . Hematuria 09/18/2015  . Hypertension    Phreesia 08/12/2020  . Internal hemorrhoids with other complication 12/27/2013   JAN 2015 R ANT MAR 2015 R POS AND L LAT BAND   . Menstrual periods irregular 09/18/2015  . Migraine without aura, with intractable migraine, so stated, without mention of status migrainosus 11/11/2013  . Patient desires pregnancy 11/27/2013  . Preeclampsia 05/21/2019   2x/wk testing nst alt w/ bpp/dopp      Deliver @ 37wks        IOL scheduled for 8/19 @ 0830  . Pregnant 12/16/2015  . Vitamin D deficiency     Past Surgical History:  Procedure Laterality Date  . CHOLECYSTECTOMY  02/2015  . COLONOSCOPY N/A 04/19/2013   [id number] mucosa in the terminal ileum/Single erosion in  transverse colon/RECTAL BLEEDING DUE TO Moderate sized internal hemorrhoids/ trv colon erosion, bx benign.  . COLONOSCOPY WITH PROPOFOL N/A 08/31/2020   Procedure: COLONOSCOPY WITH PROPOFOL;  Surgeon: Lanelle Bal, DO;  [facility]: [facility];  Service: Endoscopy;  Laterality: N/A;  1:30pm  . ESOPHAGOGASTRODUODENOSCOPY N/A 04/19/2013   ZJQ:BHAL Non-erosive gastritis/PERI-UMBILICAL PAIN DUE TO GERD, GASTRITIS, AND CONSTIPATION. Bx with mild chronic inactive gastritis. No H.Pylori  . HEMORRHOID BANDING  2015   Dr. Jena Gauss- in office banding procedure  . TONSILLECTOMY    . 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   Subjective Assessment - 10/06/20 1335    Subjective Patient fell on Christmas and hurt her back. She reports her feet have held up well. She has alittle pain today.    Limitations Walking;Standing    How long can you sit comfortably? unlimited    How long can you stand comfortably? Unlimited with pain    How long can you walk comfortably? unlimited with pain    Patient Stated Goals Pain relief    Currently in Pain? Yes    Pain Location Foot    Pain Orientation Right;Left    Pain Descriptors / Indicators Aching    Pain Type Chronic pain  Pain Onset More than a month ago    Pain Frequency Intermittent    Aggravating Factors  worsens when walking    Pain Relieving Factors stretches    Effect of Pain on Daily Activities hurts    Multiple Pain Sites No              [facility] PT Assessment - 10/07/20 0001      AROM   Right Ankle Dorsiflexion 7    Left Ankle Dorsiflexion 6                         [facility] Adult PT Treatment/Exercise - 10/07/20 0001      Manual Therapy   Manual Therapy Soft tissue mobilization;Myofascial release;Passive ROM    Soft tissue mobilization TFM to bilateral plantar facia; trigger point release to bilateral calfs in muliple areas. Multiple trigger points noted in her calf. Reviewed  with patient how to find them;    Passive ROM gentle into dorsi flexion      Ankle Exercises: Supine   T-Band PF x20; Inversion x20      Ankle Exercises: Standing   Other Standing Ankle Exercises weight shifting in pain free range 2x10; slow march 2x10 with limited UE support                  PT Education - 10/06/20 1337    Education Details reviewed HEP    Person(s) Educated Patient    Methods Explanation;Demonstration;Tactile cues;Verbal cues    Comprehension Verbalized understanding;Returned demonstration;Verbal cues required;Tactile cues required            PT Short Term Goals - 10/07/20 1306      PT SHORT TERM GOAL #1   Title Pt will be IND with HEP    Time 3    Period Weeks    Status On-going    Target Date 09/14/20      PT SHORT TERM GOAL #2   Title Pt will report ability to walk approximately 60 min with pain report </=3/10 to be able to walk with their family    Time 3    Period Weeks    Status On-going    Target Date 09/14/20      PT SHORT TERM GOAL #3   Title Pt will increase bilateral ankle DF AROM by 3 degrees to improve functional walking ability for work    Time 3    Period Weeks    Status On-going    Target Date 09/14/20             PT Long Term Goals - 08/24/20 1700      PT LONG TERM GOAL #1   Title Pt will be IND with all given HEP to promote functional independence and ability to maintain quality of life.    Time 6    Period Weeks    Status New    Target Date 10/05/20      PT LONG TERM GOAL #2   Title Pt will decrease FOTO foot limitation from 20% to 11% to improve perception of functional performance and personal agency.    Time 6    Period Weeks    Status New    Target Date 10/05/20      PT LONG TERM GOAL #3   Title Pt will report ability to get up out of bed/chair with pain report </= 3/10 to improve quality of life and increase ambulation time/distance  Time 6    Period Weeks    Status New    Target Date 10/05/20       PT LONG TERM GOAL #4   Title Pt will maintain current strength level with no pain report for increased ambulation time and work performance.    Time 6    Period Weeks    Status New    Target Date 10/05/20      PT LONG TERM GOAL #5   Title Pt will report ability to perform work tasks with no limitation or pain reported for quality of life    Time 6    Period Weeks    Status New    Target Date 10/05/20      Additional Long Term Goals   Additional Long Term Goals Yes      PT LONG TERM GOAL #6   Title Pt will improve bilateral ankle DF to >/=8 with no report of heel pain to improve functional mobility    Time 6    Period Weeks    Status New    Target Date 10/05/20                 Plan - 10/07/20 1255    Clinical Impression Statement Patient had trigger points in bilateral claves today but less pain. Her FOTO score decreased but her intial answers did not appear to be realistic for the high levels of pain she had when she first came in. Her overall ankle motion has improved. She was encoruaged to continue working on her stretching she would benefit from further therapy 1-2x a week for 4 weeks. She was encouraged to continue with her home exercises. She was given gross LE strengthening but was advised her ankle and calf work are more improtant.    Personal Factors and Comorbidities Profession    Examination-Participation Restrictions Occupation    Stability/Clinical Decision Making Stable/Uncomplicated    Clinical Decision Making Low    Rehab Potential Good    PT Frequency 2x / week    PT Duration 6 weeks    PT Treatment/Interventions ADLs/Self Care Home Management;Electrical Stimulation;Iontophoresis 4mg /ml Dexamethasone;Gait training;Stair training;Functional mobility training;Therapeutic exercise;Balance training;Therapeutic activities;Neuromuscular re-education;Manual techniques;Orthotic Fit/Training;Patient/family education;Dry needling;Joint Manipulations;Taping    PT  Next Visit Plan Review FOTO, Review/Progress HEP, anti-protation taping, manual therapy of distal LE, plantar fascia IASTM?    PT Home Exercise Plan [id number] - Plantar fascia stretch, gastroc stretch, soleus stretch, foot roller plantar massage    Consulted and Agree with Plan of Care Patient           Patient will benefit from skilled therapeutic intervention in order to improve the following deficits and impairments:  Decreased activity tolerance,Pain,Hypomobility,Decreased endurance,Difficulty walking,Increased muscle spasms  Visit Diagnosis: Pain in left foot  Pain in right foot  Stiffness of right ankle, not elsewhere classified  Stiffness of left ankle, not elsewhere classified     Problem List Patient Active Problem List   Diagnosis Date Noted  . Rectal bleeding 08/19/2020  . Plantar fasciitis 07/05/2020  . Abnormal vaginal bleeding 04/07/2020  . Microscopic hematuria 11/08/2019  . Persistent proteinuria 11/08/2019  . Nail discoloration 08/24/2019  . Preeclampsia in postpartum period 06/06/2019  . Gestational hypertension 05/29/2019  . Chiari malformation type I ([place]) 11/27/2013  . Common migraine 11/11/2013  . Headache disorder 07/09/2013  . Vitamin D deficiency 03/06/2013  . GERD (gastroesophageal reflux disease) 03/06/2013  . 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 Morbid obesity ([place]) 07/07/2010  . SINUS  ARRHYTHMIA 07/07/2010  . ANEMIA 06/23/2010    Carney Living PT DPT  10/07/2020, 1:06 PM  [facility] [address] [place], [place], [address] Phone: [phone]   Fax:  [phone]  Name: Janet [name] MRN: [id number] Date of Birth: 01-30-1986

## 2020-10-08 ENCOUNTER — Other Ambulatory Visit: Payer: Self-pay

## 2020-10-08 ENCOUNTER — Ambulatory Visit: Payer: 59 | Admitting: Physical Therapy

## 2020-10-08 ENCOUNTER — Ambulatory Visit: Payer: 59 | Admitting: Podiatry

## 2020-10-08 ENCOUNTER — Ambulatory Visit: Payer: 59 | Admitting: Gastroenterology

## 2020-10-08 DIAGNOSIS — M722 Plantar fascial fibromatosis: Secondary | ICD-10-CM | POA: Diagnosis not present

## 2020-10-08 NOTE — Patient Instructions (Signed)
Continue physical therapy and home exercises  Use ice and Meloxicam as needed for pain

## 2020-10-08 NOTE — Progress Notes (Signed)
  Subjective:  Patient ID: Janet [name], female    DOB: [birth year],  MRN: [id number]  Chief Complaint  Patient presents with  . Plantar Fasciitis    PT stated  that she is doing well. She still has some soreness but it is not as bad and stated that the physical therapy Is going well and is helping a lot.     34 y.o. female returns for follow-up with the above complaint. History confirmed with patient.  Physical therapy has been very helpful.  Not much pain on the right now some of the left still  Objective:  Physical Exam: warm, good capillary refill, no trophic changes or ulcerative lesions, normal DP and PT pulses and normal sensory exam. There is no pain in the right foot at this point there is sharp pain on palpation to the plantar medial insertion of the plantar fashion on the calcaneus, none in the mid substance bilaterally   Radiographs: X-ray of both feet: no fracture, dislocation, swelling or degenerative changes noted, hallux valgus deformity and pes planus Assessment:   No diagnosis found.   Plan:  Patient was evaluated and treated and all questions answered.      -She is doing very well at this point.  I think physical therapy has been the most helpful for her.  I think we should continue this for another 6 to 8 weeks.  No injection was performed today and she would prefer to avoid this if possible.  If it worsens or is not better by next visit we will consider then.  Return in about 8 weeks (around 12/03/2020) for recheck plantar fasciitis.

## 2020-10-13 ENCOUNTER — Other Ambulatory Visit: Payer: Self-pay

## 2020-10-13 ENCOUNTER — Ambulatory Visit: Payer: 59 | Admitting: Gastroenterology

## 2020-10-13 ENCOUNTER — Encounter: Payer: Self-pay | Admitting: Gastroenterology

## 2020-10-13 ENCOUNTER — Ambulatory Visit: Payer: 59 | Attending: Podiatry | Admitting: Physical Therapy

## 2020-10-13 ENCOUNTER — Encounter: Payer: Self-pay | Admitting: Physical Therapy

## 2020-10-13 VITALS — BP 144/92 | HR 82 | Temp 97.3°F | Ht 64.0 in | Wt 233.6 lb

## 2020-10-13 DIAGNOSIS — M25672 Stiffness of left ankle, not elsewhere classified: Secondary | ICD-10-CM | POA: Diagnosis present

## 2020-10-13 DIAGNOSIS — M25671 Stiffness of right ankle, not elsewhere classified: Secondary | ICD-10-CM

## 2020-10-13 DIAGNOSIS — M79671 Pain in right foot: Secondary | ICD-10-CM

## 2020-10-13 DIAGNOSIS — M79672 Pain in left foot: Secondary | ICD-10-CM

## 2020-10-13 DIAGNOSIS — K641 Second degree hemorrhoids: Secondary | ICD-10-CM | POA: Diagnosis not present

## 2020-10-13 NOTE — Therapy (Signed)
[place], [place], [address] Phone: [phone]   Fax:  [phone]  Physical Therapy Treatment  Patient Details  Name: Janet [name] MRN: [id number] Date of Birth: 01-Apr-1986 Referring Provider (PT): Criselda Peaches, [place]    Encounter Date: 10/13/2020   PT End of Session - 10/13/20 1541    Visit Number 6    Number of Visits 13    Date for PT Re-Evaluation 11/04/20    Authorization Type Aetna - FOTO at 6th & 10th    PT Start Time 1335   Patient 5 minutes late   PT Stop Time 1400   Per patient request ( another apointment)   PT Time Calculation (min) 25 min    Activity Tolerance Patient tolerated treatment well    Behavior During Therapy [facility] for tasks assessed/performed           Past Medical History:  Diagnosis Date  . Anemia    Phreesia 08/12/2020  . 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 Carpal tunnel syndrome during pregnancy   . Chiari malformation type I ([place]) 2015   on MRI  . Cushing syndrome ([place]) 2011   r/t adrenal tumor; tumor removed, disease resolved  . Cushing's syndrome ([place]) 11/27/2013  . Gastroesophageal reflux disease   . GERD (gastroesophageal reflux disease)    Phreesia 08/12/2020  . Heart murmur    Phreesia 08/12/2020  . Hematuria 09/18/2015  . Hypertension    Phreesia 08/12/2020  . Internal hemorrhoids with other complication [id number]   JAN 2015 R ANT MAR 2015 R POS AND L LAT BAND   . Menstrual periods irregular 09/18/2015  . Migraine without aura, with intractable migraine, so stated, without mention of status migrainosus 11/11/2013  . Patient desires pregnancy 11/27/2013  . Preeclampsia 05/21/2019   2x/wk testing nst alt w/ bpp/dopp      Deliver @ 37wks        IOL scheduled for 8/19 @ 0830  . Pregnant 12/16/2015  . Vitamin D deficiency     Past Surgical History:  Procedure Laterality Date  . CHOLECYSTECTOMY  02/2015  . COLONOSCOPY N/A  04/19/2013   [id number] mucosa in the terminal ileum/Single erosion in transverse colon/RECTAL BLEEDING DUE TO Moderate sized internal hemorrhoids/ trv colon erosion, bx benign.  . COLONOSCOPY WITH PROPOFOL N/A 08/31/2020   Non-bleeding internal hemorrhoids. Colon torturous.   . ESOPHAGOGASTRODUODENOSCOPY N/A 04/19/2013   [id number] Non-erosive gastritis/PERI-UMBILICAL PAIN DUE TO GERD, GASTRITIS, AND CONSTIPATION. Bx with mild chronic inactive gastritis. No H.Pylori  . HEMORRHOID BANDING  2015   Dr. Gala Romney- in office banding procedure  . TONSILLECTOMY    . 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   Subjective Assessment - 10/13/20 1337    Subjective Patient reports no pain today. She has been to the MD.    Limitations Walking;Standing    How long can you sit comfortably? unlimited    How long can you stand comfortably? Unlimited with pain    How long can you walk comfortably? unlimited with pain    Patient Stated Goals Pain relief    Currently in Pain? No/denies                             [facility] Adult PT Treatment/Exercise - 10/13/20 0001      Manual Therapy  Manual Therapy Soft tissue mobilization;Myofascial release;Passive ROM    Soft tissue mobilization TFM to bilateral plantar facia; trigger point release to bilateral calfs in muliple areas. Multiple trigger points noted in her calf. Reviewed with patient how to find them;    Passive ROM gentle into dorsi flexion                     PT Short Term Goals - 10/07/20 1306      PT SHORT TERM GOAL #1   Title Pt will be IND with HEP    Time 3    Period Weeks    Status On-going    Target Date 09/14/20      PT SHORT TERM GOAL #2   Title Pt will report ability to walk approximately 60 min with pain report </=3/10 to be able to walk with their family    Time 3    Period Weeks    Status On-going    Target Date 09/14/20      PT SHORT TERM GOAL #3   Title  Pt will increase bilateral ankle DF AROM by 3 degrees to improve functional walking ability for work    Time 3    Period Weeks    Status On-going    Target Date 09/14/20             PT Long Term Goals - 08/24/20 1700      PT LONG TERM GOAL #1   Title Pt will be IND with all given HEP to promote functional independence and ability to maintain quality of life.    Time 6    Period Weeks    Status New    Target Date 10/05/20      PT LONG TERM GOAL #2   Title Pt will decrease FOTO foot limitation from 20% to 11% to improve perception of functional performance and personal agency.    Time 6    Period Weeks    Status New    Target Date 10/05/20      PT LONG TERM GOAL #3   Title Pt will report ability to get up out of bed/chair with pain report </= 3/10 to improve quality of life and increase ambulation time/distance    Time 6    Period Weeks    Status New    Target Date 10/05/20      PT LONG TERM GOAL #4   Title Pt will maintain current strength level with no pain report for increased ambulation time and work performance.    Time 6    Period Weeks    Status New    Target Date 10/05/20      PT LONG TERM GOAL #5   Title Pt will report ability to perform work tasks with no limitation or pain reported for quality of life    Time 6    Period Weeks    Status New    Target Date 10/05/20      Additional Long Term Goals   Additional Long Term Goals Yes      PT LONG TERM GOAL #6   Title Pt will improve bilateral ankle DF to >/=8 with no report of heel pain to improve functional mobility    Time 6    Period Weeks    Status New    Target Date 10/05/20                 Plan - 10/13/20 1633  Clinical Impression Statement Patient appears to be improving. her left foot is still limited compared to the left. She was able to stand all day at work today without any pain. She is having minor pain in the morning. Per visual inspection the patient has improved dorsi flexion  especially on the right. She was limited today by time. She had to go to another appointment. She was given a green band for home . She will do her exercises at home.    Personal Factors and Comorbidities Profession    Examination-Participation Restrictions Occupation    Stability/Clinical Decision Making Stable/Uncomplicated    Clinical Decision Making Low    Rehab Potential Good    PT Treatment/Interventions ADLs/Self Care Home Management;Electrical Stimulation;Iontophoresis 4mg /ml Dexamethasone;Gait training;Stair training;Functional mobility training;Therapeutic exercise;Balance training;Therapeutic activities;Neuromuscular re-education;Manual techniques;Orthotic Fit/Training;Patient/family education;Dry needling;Joint Manipulations;Taping    PT Next Visit Plan , Review/Progress HEP, anti-protation taping, manual therapy of distal LE, plantar fascia IASTM?    PT Home Exercise Plan [id number] - Plantar fascia stretch, gastroc stretch, soleus stretch, foot roller plantar massage    Consulted and Agree with Plan of Care Patient           Patient will benefit from skilled therapeutic intervention in order to improve the following deficits and impairments:  Decreased activity tolerance,Pain,Hypomobility,Decreased endurance,Difficulty walking,Increased muscle spasms  Visit Diagnosis: Pain in left foot  Pain in right foot  Stiffness of right ankle, not elsewhere classified  Stiffness of left ankle, not elsewhere classified     Problem List Patient Active Problem List   Diagnosis Date Noted  . Grade II hemorrhoids 10/13/2020  . Rectal bleeding 08/19/2020  . Plantar fasciitis 07/05/2020  . Abnormal vaginal bleeding 04/07/2020  . Microscopic hematuria 11/08/2019  . Persistent proteinuria 11/08/2019  . Nail discoloration 08/24/2019  . Preeclampsia in postpartum period 06/06/2019  . Gestational hypertension 05/29/2019  . Chiari malformation type I (HCC) 11/27/2013  . Common migraine  11/11/2013  . Headache disorder 07/09/2013  . Vitamin D deficiency 03/06/2013  . GERD (gastroesophageal reflux disease) 03/06/2013  . 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 Morbid obesity (HCC) 07/07/2010  . SINUS ARRHYTHMIA 07/07/2010  . ANEMIA 06/23/2010    06/25/2010 PT DPT  10/13/2020, 4:44 PM  [facility] [address] [place], [place], [place] Phone: [phone]   Fax:  [phone]  Name: Janet [name] MRN: [name] Date of Birth: 29-Sep-1986

## 2020-10-13 NOTE — Patient Instructions (Signed)
Try to limit toilet time to 2-3 minutes, avoid straining, and avoid constipation.  We will see you back in 2-3 weeks for additional banding!  I enjoyed seeing you again today! As you know, I value our relationship and want to provide genuine, compassionate, and quality care. I welcome your feedback. If you receive a survey regarding your visit,  I greatly appreciate you taking time to fill this out. See you next time!  Gelene Mink, PhD, ANP-BC [facility]  t

## 2020-10-13 NOTE — Progress Notes (Signed)
[facility] Banding Note:   Pleasant 35 year old female presenting with symptomatic hemorrhoids, s/p recent colonoscopy on file with non-bleeding internal hemorrhoids. Prior banding in 2015. Denies any further rectal bleeding but does have pressure and needing to wipe multiple times.   The patient presents with symptomatic grade 2 hemorrhoids, unresponsive to maximal medical therapy, requesting rubber band ligation of her hemorrhoidal disease. All risks, benefits, and alternative forms of therapy were described and informed consent was obtained.  The decision was made to band the left lateral internal hemorrhoid, and the [facility] O'Regan System was used to perform band ligation without complication. Latex free banding was used due to latex sensitivity. Digital anorectal examination was then performed to assure proper positioning of the band, and to adjust the banded tissue as required. The patient was discharged home without pain or other issues. Dietary and behavioral recommendations were given and (if necessary prescriptions were given), along with follow-up instructions. The patient will return in 2-3 weeks for followup and possible additional banding as required.  No complications were encountered and the patient tolerated the procedure well  Gelene Mink, PhD, [facility]

## 2020-10-14 ENCOUNTER — Encounter: Payer: Self-pay | Admitting: Internal Medicine

## 2020-10-15 ENCOUNTER — Ambulatory Visit: Payer: 59 | Admitting: Physical Therapy

## 2020-10-15 ENCOUNTER — Encounter: Payer: Self-pay | Admitting: Physical Therapy

## 2020-10-15 ENCOUNTER — Other Ambulatory Visit: Payer: Self-pay

## 2020-10-15 DIAGNOSIS — M79672 Pain in left foot: Secondary | ICD-10-CM | POA: Diagnosis not present

## 2020-10-15 DIAGNOSIS — M25672 Stiffness of left ankle, not elsewhere classified: Secondary | ICD-10-CM

## 2020-10-15 DIAGNOSIS — M79671 Pain in right foot: Secondary | ICD-10-CM

## 2020-10-15 DIAGNOSIS — M25671 Stiffness of right ankle, not elsewhere classified: Secondary | ICD-10-CM

## 2020-10-15 NOTE — Therapy (Signed)
[place], [place], [address] Phone: [phone]   Fax:  [phone]  Physical Therapy Treatment  Patient Details  Name: Janet [name] MRN: [id number] Date of Birth: [birth year] Referring Provider (PT): Criselda Peaches, [place]    Encounter Date: 10/15/2020   PT End of Session - 10/15/20 1636    Visit Number 7    Number of Visits 13    Date for PT Re-Evaluation 11/04/20    Authorization Type Aetna - FOTO at 6th & 10th    PT Start Time 1145    PT Stop Time 1226    PT Time Calculation (min) 41 min    Activity Tolerance Patient tolerated treatment well    Behavior During Therapy [facility] for tasks assessed/performed           Past Medical History:  Diagnosis Date  . Anemia    Phreesia 08/12/2020  . 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 Carpal tunnel syndrome during pregnancy   . Chiari malformation type I ([place]) 2015   on MRI  . 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 Gastroesophageal reflux disease   . GERD (gastroesophageal reflux disease)    Phreesia 08/12/2020  . Heart murmur    Phreesia 08/12/2020  . Hematuria 09/18/2015  . Hypertension    Phreesia 08/12/2020  . Internal hemorrhoids with other complication [id number]   JAN 2015 R ANT MAR 2015 R POS AND L LAT BAND   . Menstrual periods irregular 09/18/2015  . Migraine without aura, with intractable migraine, so stated, without mention of status migrainosus 11/11/2013  . Patient desires pregnancy 11/27/2013  . Preeclampsia 05/21/2019   2x/wk testing nst alt w/ bpp/dopp      Deliver @ 37wks        IOL scheduled for 8/19 @ 0830  . Pregnant 12/16/2015  . Vitamin D deficiency     Past Surgical History:  Procedure Laterality Date  . CHOLECYSTECTOMY  02/2015  . COLONOSCOPY N/A 04/19/2013   [id number] mucosa in the terminal ileum/Single erosion in  transverse colon/RECTAL BLEEDING DUE TO Moderate sized internal hemorrhoids/ trv colon erosion, bx benign.  . COLONOSCOPY WITH PROPOFOL N/A 08/31/2020   Non-bleeding internal hemorrhoids. Colon torturous.   . ESOPHAGOGASTRODUODENOSCOPY N/A 04/19/2013   [id number] Non-erosive gastritis/PERI-UMBILICAL PAIN DUE TO GERD, GASTRITIS, AND CONSTIPATION. Bx with mild chronic inactive gastritis. No H.Pylori  . HEMORRHOID BANDING  2015   Dr. Gala Romney- in office banding procedure  . TONSILLECTOMY    . 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   Subjective Assessment - 10/15/20 1218    Subjective Patient is doing well. She is not having pain today. She has been working. She continues to work on her stretches and exercises at home.    Limitations Walking;Standing    How long can you sit comfortably? unlimited    How long can you stand comfortably? Unlimited with pain    How long can you walk comfortably? unlimited with pain    Patient Stated Goals Pain relief    Currently in Pain? No/denies                             [facility] Adult PT Treatment/Exercise - 10/15/20 0001      Manual  Therapy   Manual Therapy Soft tissue mobilization;Myofascial release;Passive ROM    Soft tissue mobilization TFM to bilateral plantar facia; trigger point release to bilateral calfs in muliple areas. Multiple trigger points noted in her calf. Reviewed with patient how to find them;    Passive ROM gentle into dorsi flexion      Ankle Exercises: Supine   T-Band PF x20; Inversion x20DF x20 green bilateral      Ankle Exercises: Standing   Other Standing Ankle Exercises heel raise x20    Other Standing Ankle Exercises air-ex narrow base 3x30 second holds; tandem stance 2x20 sec each; single leg satance 3x15 second holds, patient could feel it on the left; step onto air-ex x15 each leg                  PT Education - 10/15/20 1635    Education Details  instablity exercises    Person(s) Educated Patient    Methods Explanation;Demonstration;Tactile cues;Verbal cues    Comprehension Verbalized understanding;Returned demonstration;Tactile cues required;Verbal cues required            PT Short Term Goals - 10/07/20 1306      PT SHORT TERM GOAL #1   Title Pt will be IND with HEP    Time 3    Period Weeks    Status On-going    Target Date 09/14/20      PT SHORT TERM GOAL #2   Title Pt will report ability to walk approximately 60 min with pain report </=3/10 to be able to walk with their family    Time 3    Period Weeks    Status On-going    Target Date 09/14/20      PT SHORT TERM GOAL #3   Title Pt will increase bilateral ankle DF AROM by 3 degrees to improve functional walking ability for work    Time 3    Period Weeks    Status On-going    Target Date 09/14/20             PT Long Term Goals - 08/24/20 1700      PT LONG TERM GOAL #1   Title Pt will be IND with all given HEP to promote functional independence and ability to maintain quality of life.    Time 6    Period Weeks    Status New    Target Date 10/05/20      PT LONG TERM GOAL #2   Title Pt will decrease FOTO foot limitation from 20% to 11% to improve perception of functional performance and personal agency.    Time 6    Period Weeks    Status New    Target Date 10/05/20      PT LONG TERM GOAL #3   Title Pt will report ability to get up out of bed/chair with pain report </= 3/10 to improve quality of life and increase ambulation time/distance    Time 6    Period Weeks    Status New    Target Date 10/05/20      PT LONG TERM GOAL #4   Title Pt will maintain current strength level with no pain report for increased ambulation time and work performance.    Time 6    Period Weeks    Status New    Target Date 10/05/20      PT LONG TERM GOAL #5   Title Pt will report ability to perform work tasks with no  limitation or pain reported for quality of life     Time 6    Period Weeks    Status New    Target Date 10/05/20      Additional Long Term Goals   Additional Long Term Goals Yes      PT LONG TERM GOAL #6   Title Pt will improve bilateral ankle DF to >/=8 with no report of heel pain to improve functional mobility    Time 6    Period Weeks    Status New    Target Date 10/05/20                 Plan - 10/15/20 1640    Clinical Impression Statement Patients left foot continues to be tender to palpation. Her right foot is doing very well. Therapy was able to add instability work today. She was given single leg stance activity for home. She can still feel it on her left. She was advised to continue to listen to her left foot as she is progressing her single leg stance activity.    Personal Factors and Comorbidities Profession    Examination-Participation Restrictions Occupation    Stability/Clinical Decision Making Stable/Uncomplicated    Clinical Decision Making Low    Rehab Potential Good    PT Frequency 2x / week    PT Duration 6 weeks    PT Treatment/Interventions ADLs/Self Care Home Management;Electrical Stimulation;Iontophoresis 4mg /ml Dexamethasone;Gait training;Stair training;Functional mobility training;Therapeutic exercise;Balance training;Therapeutic activities;Neuromuscular re-education;Manual techniques;Orthotic Fit/Training;Patient/family education;Dry needling;Joint Manipulations;Taping    PT Next Visit Plan , Review/Progress HEP, anti-protation taping, manual therapy of distal LE, plantar fascia IASTM?    PT Home Exercise Plan [id number] - Plantar fascia stretch, gastroc stretch, soleus stretch, foot roller plantar massage    Consulted and Agree with Plan of Care Patient           Patient will benefit from skilled therapeutic intervention in order to improve the following deficits and impairments:  Decreased activity tolerance,Pain,Hypomobility,Decreased endurance,Difficulty walking,Increased muscle spasms  Visit  Diagnosis: Pain in left foot  Pain in right foot  Stiffness of right ankle, not elsewhere classified  Stiffness of left ankle, not elsewhere classified     Problem List Patient Active Problem List   Diagnosis Date Noted  . Grade II hemorrhoids 10/13/2020  . Rectal bleeding 08/19/2020  . Plantar fasciitis 07/05/2020  . Abnormal vaginal bleeding 04/07/2020  . Microscopic hematuria 11/08/2019  . Persistent proteinuria 11/08/2019  . Nail discoloration 08/24/2019  . Preeclampsia in postpartum period 06/06/2019  . Gestational hypertension 05/29/2019  . Chiari malformation type I ([place]) 11/27/2013  . Common migraine 11/11/2013  . Headache disorder 07/09/2013  . Vitamin D deficiency 03/06/2013  . GERD (gastroesophageal reflux disease) 03/06/2013  . 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 Morbid obesity ([place]) 07/07/2010  . SINUS ARRHYTHMIA 07/07/2010  . ANEMIA 06/23/2010    Carney Living PT DPT  10/15/2020, 4:42 PM  [facility] [address] [place], [place], [address] Phone: [phone]   Fax:  [phone]  Name: Janet [name] MRN: [id number] Date of Birth: 1985/10/26

## 2020-10-19 ENCOUNTER — Other Ambulatory Visit: Payer: Self-pay

## 2020-10-19 ENCOUNTER — Ambulatory Visit (HOSPITAL_COMMUNITY)
Admission: RE | Admit: 2020-10-19 | Discharge: 2020-10-19 | Disposition: A | Discharge status: Home / Self Care | Payer: 59 | Source: Ambulatory Visit | Attending: Family Medicine | Admitting: Family Medicine

## 2020-10-19 ENCOUNTER — Encounter: Payer: Self-pay | Admitting: Family Medicine

## 2020-10-19 ENCOUNTER — Telehealth (INDEPENDENT_AMBULATORY_CARE_PROVIDER_SITE_OTHER): Payer: 59 | Admitting: Family Medicine

## 2020-10-19 VITALS — BP 146/92 | HR 93 | Ht 64.0 in | Wt 233.0 lb

## 2020-10-19 DIAGNOSIS — I10 Essential (primary) hypertension: Secondary | ICD-10-CM | POA: Diagnosis not present

## 2020-10-19 DIAGNOSIS — M5410 Radiculopathy, site unspecified: Secondary | ICD-10-CM

## 2020-10-19 MED ORDER — IBUPROFEN 800 MG PO TABS: 800.0000 mg | ORAL_TABLET | Freq: Three times a day (TID) | ORAL | 0 refills | Status: DC

## 2020-10-19 MED ORDER — GABAPENTIN 100 MG PO CAPS: ORAL_CAPSULE | ORAL | 3 refills | Status: DC

## 2020-10-19 NOTE — Progress Notes (Signed)
Virtual Visit via Telephone Note  I connected with Janet [name] on 10/19/20 at  2:15 PM EST by telephone and verified that I am speaking with the correct person using two identifiers.  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 I also discussed with the patient that there may be a patient responsible charge related to this service. The patient expressed understanding and agreed to proceed.   History of Present Illness: Left thigh to toe  And hip pain radiaiting to all toes started at 10 , limping, now an 8 from left hip , to lateral left ankle, no aggravating factor noted Denies incontinence of stool or urine   Observations/Objective: BP (!) 146/92   Pulse 93   Ht 5\' 4"  (1.626 m)   Wt 233 lb (105.7 kg)   LMP 09/19/2020   BMI 39.99 kg/m  Good communication with no confusion and intact memory. Alert and oriented x 3 No signs of respiratory distress during speech    Assessment and Plan: Back pain with radiculopathy Acute onset with no precipitating insult. Gabapentin and ibuprofen and X rya back, if not improved or no back pathology may be hip primarily and will refer to Ortho  Essential hypertension Uncontrolled  Per record, needs in office re eval  DASH diet and commitment to daily physical activity for a minimum of 30 minutes discussed and encouraged, as a part of hypertension management. The importance of attaining a healthy weight is also discussed.  BP/Weight 10/19/2020 10/13/2020 08/31/2020 08/26/2020 08/19/2020 06/30/2020 1/61/0960  Systolic BP 454 098 94 119 147 829 562  Diastolic BP 92 92 52 78 130 89 93  Wt. (Lbs) 233 233.6 226 231 231.4 234 231  BMI 39.99 40.1 38.79 39.65 39.72 40.17 39.65         Follow Up Instructions:    I discussed the assessment and treatment plan with the  patient. The patient was provided an opportunity to ask questions and all were answered. The patient agreed with the plan and demonstrated an understanding of the instructions.   The patient was advised to call back or seek an in-person evaluation if the symptoms worsen or if the condition fails to improve as anticipated.  I provided 20 minutes of non-face-to-face time during this encounter.   Tula Nakayama, MD

## 2020-10-19 NOTE — Patient Instructions (Signed)
F/U with phone visit to re evaluate left leg pain, call if you need me sooner  Please get X ray of your low back today  Ibuprofen and gabapentin are prescribed for pain, which I think is from arthritis/ disc disease  In your low back   Hope that you soon feel better  Thanks for choosing [facility], we consider it a privelige to serve you.

## 2020-10-20 ENCOUNTER — Encounter: Payer: Self-pay | Admitting: Family Medicine

## 2020-10-20 ENCOUNTER — Ambulatory Visit: Payer: 59 | Admitting: Physical Therapy

## 2020-10-20 ENCOUNTER — Encounter: Payer: Self-pay | Admitting: Physical Therapy

## 2020-10-20 DIAGNOSIS — M25671 Stiffness of right ankle, not elsewhere classified: Secondary | ICD-10-CM

## 2020-10-20 DIAGNOSIS — M5410 Radiculopathy, site unspecified: Secondary | ICD-10-CM | POA: Insufficient documentation

## 2020-10-20 DIAGNOSIS — M79672 Pain in left foot: Secondary | ICD-10-CM

## 2020-10-20 DIAGNOSIS — M25672 Stiffness of left ankle, not elsewhere classified: Secondary | ICD-10-CM

## 2020-10-20 DIAGNOSIS — M79671 Pain in right foot: Secondary | ICD-10-CM

## 2020-10-20 NOTE — Assessment & Plan Note (Signed)
Uncontrolled  Per record, needs in office re eval  DASH diet and commitment to daily physical activity for a minimum of 30 minutes discussed and encouraged, as a part of hypertension management. The importance of attaining a healthy weight is also discussed.  BP/Weight 10/19/2020 10/13/2020 08/31/2020 08/26/2020 08/19/2020 06/30/2020 11/01/4823  Systolic BP 003 704 94 888 916 945 038  Diastolic BP 92 92 52 78 882 89 93  Wt. (Lbs) 233 233.6 226 231 231.4 234 231  BMI 39.99 40.1 38.79 39.65 39.72 40.17 39.65

## 2020-10-20 NOTE — Assessment & Plan Note (Signed)
Acute onset with no precipitating insult. Gabapentin and ibuprofen and X rya back, if not improved or no back pathology may be hip primarily and will refer to Ortho

## 2020-10-21 ENCOUNTER — Encounter: Payer: Self-pay | Admitting: Physical Therapy

## 2020-10-21 ENCOUNTER — Ambulatory Visit: Payer: 59 | Admitting: Gastroenterology

## 2020-10-21 NOTE — Therapy (Signed)
[place], [place], [address] Phone: [phone]   Fax:  [phone]  Physical Therapy Treatment/Discharge   Patient Details  Name: Janet [name] MRN: [id number] Date of Birth: [birth year] Referring Provider (PT): Criselda Peaches, [place]    Encounter Date: 10/20/2020   PT End of Session - 10/20/20 1341    Visit Number 8    Number of Visits 13    Date for PT Re-Evaluation 11/04/20    Authorization Type Aetna - FOTO at 6th & 10th    PT Start Time 1332    PT Stop Time 1412    PT Time Calculation (min) 40 min    Activity Tolerance Patient tolerated treatment well    Behavior During Therapy [facility] for tasks assessed/performed           Past Medical History:  Diagnosis Date  . Anemia    Phreesia 08/12/2020  . 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 Carpal tunnel syndrome during pregnancy   . Chiari malformation type I ([place]) 2015   on MRI  . 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 Gastroesophageal reflux disease   . GERD (gastroesophageal reflux disease)    Phreesia 08/12/2020  . Heart murmur    Phreesia 08/12/2020  . Hematuria 09/18/2015  . Hypertension    Phreesia 08/12/2020  . Internal hemorrhoids with other complication 2/33/0076   JAN 2015 R ANT MAR 2015 R POS AND L LAT BAND   . Menstrual periods irregular 09/18/2015  . Migraine without aura, with intractable migraine, so stated, without mention of status migrainosus 11/11/2013  . Patient desires pregnancy 11/27/2013  . Preeclampsia 05/21/2019   2x/wk testing nst alt w/ bpp/dopp      Deliver @ 37wks        IOL scheduled for 8/19 @ 0830  . Pregnant 12/16/2015  . Vitamin D deficiency     Past Surgical History:  Procedure Laterality Date  . CHOLECYSTECTOMY  02/2015  . COLONOSCOPY N/A 04/19/2013   [id number] mucosa in the terminal ileum/Single  erosion in transverse colon/RECTAL BLEEDING DUE TO Moderate sized internal hemorrhoids/ trv colon erosion, bx benign.  . COLONOSCOPY WITH PROPOFOL N/A 08/31/2020   Non-bleeding internal hemorrhoids. Colon torturous.   . ESOPHAGOGASTRODUODENOSCOPY N/A 04/19/2013   GYB:[facility] Non-erosive gastritis/PERI-UMBILICAL PAIN DUE TO GERD, GASTRITIS, AND CONSTIPATION. Bx with mild chronic inactive gastritis. No H.Pylori  . HEMORRHOID BANDING  2015   Dr. Gala Romney- in office banding procedure  . TONSILLECTOMY    . 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   Subjective Assessment - 10/20/20 1337    Subjective Patient had some trouble in her hip over thweekend. She had a sharp pain shooting down the side of her leg. It has since resolved. She has had very little pain in her hip.    Limitations Walking;Standing    How long can you sit comfortably? unlimited    How long can you stand comfortably? Unlimited with pain    How long can you walk comfortably? unlimited with pain    Patient Stated Goals Pain relief    Currently in Pain? No/denies    Pain Orientation --    Pain Descriptors / Indicators --    Pain Type --    Pain Onset --  [facility] PT Assessment - 10/21/20 0001      AROM   Right Ankle Dorsiflexion 10    Left Ankle Dorsiflexion 8      Strength   Right Ankle Dorsiflexion 5/5    Right Ankle Plantar Flexion 5/5    Right Ankle Inversion 5/5    Right Ankle Eversion 5/5    Left Ankle Dorsiflexion 5/5    Left Ankle Plantar Flexion 5/5    Left Ankle Inversion 5/5    Left Ankle Eversion 5/5      Flexibility   Soft Tissue Assessment /Muscle Length yes                         [facility] Adult PT Treatment/Exercise - 10/21/20 0001      Self-Care   Other Self-Care Comments  reviewwed how to use her HEP when things flire up. Reviewed long term use of the program and progression of exercises and activity      Manual Therapy    Manual Therapy Soft tissue mobilization;Myofascial release;Passive ROM    Soft tissue mobilization TFM to bilateral plantar facia; trigger point release to bilateral calfs in muliple areas. Multiple trigger points noted in her calf. Reviewed with patient how to find them;    Passive ROM gentle into dorsi flexion      Ankle Exercises: Supine   T-Band PF x20; Inversion x20DF x20 green bilateral      Ankle Exercises: Standing   Other Standing Ankle Exercises heel raise x20    Other Standing Ankle Exercises standing slow march 2x010; step ups x10 4 inch bilateral; squat 2x10                  PT Education - 10/20/20 1340    Education Details HEP and symptom managment    Person(s) Educated Patient    Methods Explanation;Demonstration;Verbal cues;Tactile cues    Comprehension Verbalized understanding;Returned demonstration;Verbal cues required;Tactile cues required            PT Short Term Goals - 10/07/20 1306      PT SHORT TERM GOAL #1   Title Pt will be IND with HEP    Time 3    Period Weeks    Status On-going    Target Date 09/14/20      PT SHORT TERM GOAL #2   Title Pt will report ability to walk approximately 60 min with pain report </=3/10 to be able to walk with their family    Time 3    Period Weeks    Status On-going    Target Date 09/14/20      PT SHORT TERM GOAL #3   Title Pt will increase bilateral ankle DF AROM by 3 degrees to improve functional walking ability for work    Time 3    Period Weeks    Status On-going    Target Date 09/14/20             PT Long Term Goals - 10/20/20 1420      PT LONG TERM GOAL #1   Title Pt will be IND with all given HEP to promote functional independence and ability to maintain quality of life.    Baseline peforming all exercises    Time 6    Period Weeks    Status Achieved      PT LONG TERM GOAL #2   Title Pt will decrease FOTO foot limitation from 20% to 11% to improve perception  of functional performance and  personal agency.    Baseline 1% limited    Time 6    Period Weeks    Status Achieved      PT LONG TERM GOAL #3   Title Pt will report ability to get up out of bed/chair with pain report </= 3/10 to improve quality of life and increase ambulation time/distance    Baseline No pain getting up    Time 6    Period Weeks    Status Achieved      PT LONG TERM GOAL #4   Title Pt will maintain current strength level with no pain report for increased ambulation time and work performance.    Baseline 5/5 gross    Time 6    Period Weeks    Status Achieved      PT LONG TERM GOAL #5   Title Pt will report ability to perform work tasks with no limitation or pain reported for quality of life    Baseline no limiations ver the past 2 weeks.    Time 6    Period Weeks    Status Achieved      PT LONG TERM GOAL #6   Title Pt will improve bilateral ankle DF to >/=8 with no report of heel pain to improve functional mobility    Baseline 10 degrees left 8 degrees right    Time 6    Period Weeks    Status Achieved                 Plan - 10/20/20 1417    Clinical Impression Statement Patient has made excellent progress. She is having very little pain in her feet. She is having mild pain in her hip. She has been consistent with her exercises. her DF is passed neutral bilateral. She has no trigger points in her calves. She is standing at work without pain. Over the past few weeks she had just had minor pain. She has been able to manage well.    Personal Factors and Comorbidities Profession    Examination-Participation Restrictions Occupation    Stability/Clinical Decision Making Stable/Uncomplicated    Clinical Decision Making Low    Rehab Potential Good    PT Frequency 2x / week    PT Duration 6 weeks    PT Treatment/Interventions ADLs/Self Care Home Management;Electrical Stimulation;Iontophoresis 63m/ml Dexamethasone;Gait training;Stair training;Functional mobility training;Therapeutic  exercise;Balance training;Therapeutic activities;Neuromuscular re-education;Manual techniques;Orthotic Fit/Training;Patient/family education;Dry needling;Joint Manipulations;Taping    PT Next Visit Plan , Review/Progress HEP, anti-protation taping, manual therapy of distal LE, plantar fascia IASTM?    PT Home Exercise Plan [id number] - Plantar fascia stretch, gastroc stretch, soleus stretch, foot roller plantar massage    Consulted and Agree with Plan of Care Patient           Patient will benefit from skilled therapeutic intervention in order to improve the following deficits and impairments:  Decreased activity tolerance,Pain,Hypomobility,Decreased endurance,Difficulty walking,Increased muscle spasms  Visit Diagnosis: Pain in left foot  Pain in right foot  Stiffness of right ankle, not elsewhere classified  Stiffness of left ankle, not elsewhere classified  PHYSICAL THERAPY DISCHARGE SUMMARY  Visits from Start of Care:   Current functional level related to goals / functional outcomes: Improved ability to stand and walk at work   Remaining deficits: Minor pain at times   Education / Equipment: HEP   Plan: Patient agrees to discharge.  Patient goals were met. Patient is being discharged due to meeting the stated rehab  goals.  ?????       Problem List Patient Active Problem List   Diagnosis Date Noted  . Back pain with radiculopathy 10/20/2020  . Grade II hemorrhoids 10/13/2020  . Rectal bleeding 08/19/2020  . Plantar fasciitis 07/05/2020  . Abnormal vaginal bleeding 04/07/2020  . Microscopic hematuria 11/08/2019  . Persistent proteinuria 11/08/2019  . Nail discoloration 08/24/2019  . Preeclampsia in postpartum period 06/06/2019  . Gestational hypertension 05/29/2019  . Essential hypertension 09/26/2016  . Chiari malformation type I ([place]) 11/27/2013  . Common migraine 11/11/2013  . Headache disorder 07/09/2013  . Vitamin D deficiency 03/06/2013  . GERD  (gastroesophageal reflux disease) 03/06/2013  . 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 Morbid obesity ([place]) 07/07/2010  . SINUS ARRHYTHMIA 07/07/2010  . ANEMIA 06/23/2010    Carney Living 10/21/2020, 10:25 AM  [facility] [address] [place], [place], [address] Phone: [phone]   Fax:  [phone]  Name: Janet [name] MRN: [id number] Date of Birth: Feb 26, 1986

## 2020-10-22 ENCOUNTER — Ambulatory Visit: Payer: 59 | Admitting: Physical Therapy

## 2020-11-09 ENCOUNTER — Ambulatory Visit: Payer: 59 | Admitting: Neurology

## 2020-11-09 ENCOUNTER — Encounter: Payer: Self-pay | Admitting: Neurology

## 2020-11-09 ENCOUNTER — Other Ambulatory Visit: Payer: Self-pay

## 2020-11-09 VITALS — BP 143/93 | HR 78 | Ht 64.0 in | Wt 235.4 lb

## 2020-11-09 DIAGNOSIS — G43009 Migraine without aura, not intractable, without status migrainosus: Secondary | ICD-10-CM

## 2020-11-09 DIAGNOSIS — G935 Compression of brain: Secondary | ICD-10-CM

## 2020-11-09 MED ORDER — TOPIRAMATE 25 MG PO TABS: ORAL_TABLET | ORAL | 3 refills | Status: DC

## 2020-11-09 NOTE — Progress Notes (Signed)
Reason for visit: Headache  Referring physician: Dr. Lucendia Herrlich is a 35 y.o. female  History of present illness:  Janet [name] is a 35 year old right-handed black female with a history of Arnold-Chiari type I malformation and a history of headache.  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  She will note that if she has episodes of coughing or sneezing or straining, this will bring on a generalized pressure type headache that is quite intense and severe but they only last 5 minutes or so and then dissipate.  The patient has had more prolonged headaches associated with photophobia and nausea and vomiting that are more consistent with migraine, but these headaches are less frequent.  The patient had an epidural associated with delivery of a child recently and in the fall 2021 she had very frequent brief headaches.  This has improved at this point, the patient now is averaging about 1 headache a week.  The patient otherwise has no weakness of the extremities.  She does have some intermittent numbness of the hands.  She will have some occasional neck stiffness associated with the prolonged headache.  She reports no visual loss or syncope associated with the headache.  She may occasionally have some spots in front of the eyes.  She denies any gait instability.  She returns to the office today for an evaluation.  Past Medical History:  Diagnosis Date  . Anemia    Phreesia 08/12/2020  . 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 Carpal tunnel syndrome during pregnancy   . Chiari malformation type I ([place]) 2015   on MRI  . 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 Gastroesophageal reflux disease   . GERD (gastroesophageal reflux disease)    Phreesia 08/12/2020  . Heart murmur    Phreesia  08/12/2020  . Hematuria 09/18/2015  . Hypertension    Phreesia 08/12/2020  . Internal hemorrhoids with other complication 1/61/0960   JAN 2015 R ANT MAR 2015 R POS AND L LAT BAND   . Menstrual periods irregular 09/18/2015  . Migraine without aura, with intractable migraine, so stated, without mention of status migrainosus 11/11/2013  . Patient desires pregnancy 11/27/2013  . Preeclampsia 05/21/2019   2x/wk testing nst alt w/ bpp/dopp      Deliver @ 37wks        IOL scheduled for 8/19 @ 0830  . Pregnant 12/16/2015  . Vitamin D deficiency     Past Surgical History:  Procedure Laterality Date  . CHOLECYSTECTOMY  02/2015  . COLONOSCOPY N/A 04/19/2013   AVW:[id number] mucosa in the terminal ileum/Single erosion in transverse colon/RECTAL BLEEDING DUE TO Moderate sized internal hemorrhoids/ trv colon erosion, bx benign.  . COLONOSCOPY WITH PROPOFOL N/A 08/31/2020   Non-bleeding internal hemorrhoids. Colon torturous.   . ESOPHAGOGASTRODUODENOSCOPY N/A 04/19/2013   JYN:WGNF Non-erosive gastritis/PERI-UMBILICAL PAIN DUE TO GERD, GASTRITIS, AND CONSTIPATION. Bx with mild chronic inactive gastritis. No H.Pylori  . HEMORRHOID BANDING  2015   Dr. Gala Romney- in office banding procedure  . TONSILLECTOMY    . 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 Hypertension Mother   . Hyperlipidemia Mother   . Colon polyps Mother   . Hypertension Father   . Colon polyps Father   .  Hypertension Maternal Grandmother   . Diabetes Maternal Grandmother   . Deep vein thrombosis Maternal Grandmother   . Diabetes Paternal Grandmother   . COPD Paternal Grandfather   . Heart disease Paternal Grandfather   . Colon cancer Other        maternal great uncle  . Stroke Maternal Aunt   . Migraines Maternal Aunt   . Deep vein thrombosis Sister   . Other Daughter        pulmonary valve stenosis    Social history:  reports that she has never smoked. She has never used smokeless  tobacco. She reports that she does not drink alcohol and does not use drugs.  Medications:  Prior to Admission medications   Medication Sig Start Date End Date Taking? Authorizing Provider  butalbital-acetaminophen-caffeine (FIORICET) 50-325-40 MG tablet Take one tablet up to two times daily , as needed, for severe headache Patient taking differently: Take 1 tablet by mouth 2 (two) times daily as needed (severe headaches.). 08/13/20  Yes [name], MD  ergocalciferol (VITAMIN D2) 1.25 MG (50000 UT) capsule Take 1 capsule (50,000 Units total) by mouth once a week. One capsule once weekly Patient taking differently: Take 50,000 Units by mouth every Sunday. One capsule once weekly 06/30/20  Yes [name], MD  gabapentin (NEURONTIN) 100 MG capsule Take one capsule by mouth in the morning and two capsules in at bedtime for pain 10/19/20  Yes [name], MD  ibuprofen (ADVIL) 800 MG tablet Take 1 tablet (800 mg total) by mouth 3 (three) times daily. 10/19/20  Yes [name], MD  meloxicam (MOBIC) 15 MG tablet Take 1 tablet (15 mg total) by mouth daily. Patient taking differently: Take 15 mg by mouth daily as needed (Plantar fasciitis pain.). 06/11/20  Yes McDonald, Stephan Minister, DPM  pantoprazole (PROTONIX) 40 MG tablet Take one tablet by mouth once daily, as needed, for heartburn Patient taking differently: Take 40 mg by mouth daily as needed (hearburn/indigestion). 06/30/20  Yes [name], MD      Allergies  Allergen Reactions  . Methylprednisolone Shortness Of Breath, Itching and Other (See Comments)    Reaction:  Bruising   . Penicillins Anaphylaxis, Rash and Other (See Comments)    Has patient had a PCN reaction causing immediate rash, facial/tongue/throat swelling, SOB or lightheadedness with hypotension: No Has patient had a PCN reaction causing severe rash involving mucus membranes or skin necrosis: No Has patient had a PCN reaction that required  hospitalization No Has patient had a PCN reaction occurring within the last 10 years: No If all of the above answers are "NO", then may proceed with Cephalosporin use.  . Latex Rash  . Neomycin Rash    Breaks out with neosporin    ROS:  Out of a complete 14 system review of symptoms, the patient complains only of the following symptoms, and all other reviewed systems are negative.  Headache Neck stiffness  Blood pressure (!) 143/93, pulse 78, height 5\' 4"  (1.626 m), weight 235 lb 6.4 oz (106.8 kg), not currently breastfeeding.  Physical Exam  General: The patient is alert and cooperative at the time of the examination.  The patient is moderately to markedly obese.  Eyes: Pupils are equal, round, and reactive to light. Discs are flat bilaterally.  Neck: The neck is supple, no carotid bruits are noted.  Respiratory: The respiratory examination is clear.  Cardiovascular: The cardiovascular examination reveals a regular rate and rhythm, no obvious murmurs  or rubs are noted.  Skin: Extremities are without significant edema.  Neurologic Exam  Mental status: The patient is alert and oriented x 3 at the time of the examination. The patient has apparent normal recent and remote memory, with an apparently normal attention span and concentration ability.  Cranial nerves: Facial symmetry is present. There is good sensation of the face to pinprick and soft touch bilaterally. The strength of the facial muscles and the muscles to head turning and shoulder shrug are normal bilaterally. Speech is well enunciated, no aphasia or dysarthria is noted. Extraocular movements are full. Visual fields are full. The tongue is midline, and the patient has symmetric elevation of the soft palate. No obvious hearing deficits are noted.  Motor: The motor testing reveals 5 over 5 strength of all 4 extremities. Good symmetric motor tone is noted throughout.  Sensory: Sensory testing is intact to pinprick, soft  touch, vibration sensation, and position sense on all 4 extremities. No evidence of extinction is noted.  Coordination: Cerebellar testing reveals good finger-nose-finger and heel-to-shin bilaterally.  Gait and station: Gait is normal. Tandem gait is normal. Romberg is negative. No drift is seen.  Reflexes: Deep tendon reflexes are symmetric and normal bilaterally. Toes are downgoing bilaterally.   Assessment/Plan:  1.  Migraine headache  2.  Arnold-Chiari type I malformation, traction headache  The Arnold-Chiari type I malformation is significant enough that it may result in a plugging phenomenon.  The patient reports headaches that may be consistent with this.  The patient will be given a trial on Topamax, if this is not helpful we will repeat MRI of the brain and consider a neurosurgical evaluation.  The patient will follow up in 4 months.  She will call for any dose adjustments of the Topamax.   Jill Alexanders MD 11/09/2020 9:48 AM  [facility] [address] [place], [place] [address]  Phone [phone] Fax [phone]

## 2020-11-12 ENCOUNTER — Ambulatory Visit: Payer: 59 | Admitting: Urology

## 2020-11-13 ENCOUNTER — Ambulatory Visit: Payer: 59 | Admitting: Urology

## 2020-11-18 ENCOUNTER — Ambulatory Visit: Payer: 59 | Admitting: Gastroenterology

## 2020-11-18 NOTE — Progress Notes (Deleted)
Subjective:  No diagnosis found.   I have blood in my urine.  Janet [name] is a 35 yo female who is sent by [facility] for microhematuria seen on several UA's. I see 2 with small blood but she also apparently had a positive micro UA. Her UA today has 3+ blood and some protein. 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 She has had rare UTI's. She has had no stones or UTI's. She has not had any upper tract imaging. She has not had recent labs. She has family history of hematuria in her mother. There is no history of sickle cell disease in the family. she has no voiding complaints.   CT IMPRESSION: 1. No definite source for microscopic hematuria. Specifically, no urinary tract calculi no findings of urinary tract obstruction. 2. Subcentimeter low-attenuation lesion in the interpolar region of the left kidney, too small to characterize, but statistically likely to represent a tiny cyst. Should the patient's hematuria persist or worsen, further evaluation with abdominal MRI with and without IV gadolinium could be considered for definitive characterization. 3. Diffuse fatty infiltration of the pancreas without associated atrophy. This is of uncertain etiology and significance.  I have reviewed the films and report and discussed the findings with [facility].   ROS:  ROS:  A complete review of systems was performed.  All systems are negative except for pertinent findings as noted.   ROS  Allergies  Allergen Reactions  . Methylprednisolone Shortness Of Breath, Itching and Other (See Comments)    Reaction:  Bruising   . Penicillins Anaphylaxis, Rash and Other (See Comments)    Has patient had a PCN reaction causing immediate rash, facial/tongue/throat swelling, SOB or lightheadedness with hypotension: No Has patient had a PCN reaction causing severe rash involving mucus membranes or skin necrosis:  No Has patient had a PCN reaction that required hospitalization No Has patient had a PCN reaction occurring within the last 10 years: No If all of the above answers are "NO", then may proceed with Cephalosporin use.  . Latex Rash  . Neomycin Rash    Breaks out with neosporin    Outpatient Encounter Medications as of 11/19/2020  Medication Sig Note  . butalbital-acetaminophen-caffeine (FIORICET) 50-325-40 MG tablet Take one tablet up to two times daily , as needed, for severe headache (Patient taking differently: Take 1 tablet by mouth 2 (two) times daily as needed (severe headaches.).)   . ergocalciferol (VITAMIN D2) 1.25 MG (50000 UT) capsule Take 1 capsule (50,000 Units total) by mouth once a week. One capsule once weekly (Patient taking differently: Take 50,000 Units by mouth every Sunday. One capsule once weekly)   . gabapentin (NEURONTIN) 100 MG capsule Take one capsule by mouth in the morning and two capsules in at bedtime for pain   . ibuprofen (ADVIL) 800 MG tablet Take 1 tablet (800 mg total) by mouth 3 (three) times daily.   . meloxicam (MOBIC) 15 MG tablet Take 1 tablet (15 mg total) by mouth daily. (Patient taking differently: Take 15 mg by mouth daily as needed (Plantar fasciitis pain.).) 10/19/2020: Took lastnight but didn't help leg pain  . pantoprazole (PROTONIX) 40 MG tablet Take one tablet by mouth once daily, as needed, for heartburn (Patient taking differently: Take 40 mg by mouth daily as needed (hearburn/indigestion).)   . topiramate (TOPAMAX) 25 MG tablet Take one tablet at night for one week, then  take 2 tablets at night for one week, then take 3 tablets at night.    No facility-administered encounter medications on file as of 11/19/2020.    Past Medical History:  Diagnosis Date  . Anemia    Phreesia 08/12/2020  . 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 Carpal tunnel syndrome during pregnancy   . Chiari malformation type I ([place]) 2015   on MRI   . 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 Gastroesophageal reflux disease   . GERD (gastroesophageal reflux disease)    Phreesia 08/12/2020  . Heart murmur    Phreesia 08/12/2020  . Hematuria 09/18/2015  . Hypertension    Phreesia 08/12/2020  . Internal hemorrhoids with other complication 02/03/8340   JAN 2015 R ANT MAR 2015 R POS AND L LAT BAND   . Menstrual periods irregular 09/18/2015  . Migraine without aura, with intractable migraine, so stated, without mention of status migrainosus 11/11/2013  . Patient desires pregnancy 11/27/2013  . Preeclampsia 05/21/2019   2x/wk testing nst alt w/ bpp/dopp      Deliver @ 37wks        IOL scheduled for 8/19 @ 0830  . Pregnant 12/16/2015  . Vitamin D deficiency     Past Surgical History:  Procedure Laterality Date  . CHOLECYSTECTOMY  02/2015  . COLONOSCOPY N/A 04/19/2013   DQQ:IWLNLG mucosa in the terminal ileum/Single erosion in transverse colon/RECTAL BLEEDING DUE TO Moderate sized internal hemorrhoids/ trv colon erosion, bx benign.  . COLONOSCOPY WITH PROPOFOL N/A 08/31/2020   Non-bleeding internal hemorrhoids. Colon torturous.   . ESOPHAGOGASTRODUODENOSCOPY N/A 04/19/2013   XQJ:JHER Non-erosive gastritis/PERI-UMBILICAL PAIN DUE TO GERD, GASTRITIS, AND CONSTIPATION. Bx with mild chronic inactive gastritis. No H.Pylori  . HEMORRHOID BANDING  2015   Dr. Gala Romney- in office banding procedure  . TONSILLECTOMY    . tumor removed left adrenal gland 01/12      Social History   Socioeconomic History  . Marital status: Married    Spouse name: Not on file  . Number of children: 1  . Years of education: BA  . Highest education level: Not on file  Occupational History  . Occupation: full time    Employer: [employer]  Tobacco Use  . Smoking status: Never Smoker  . Smokeless tobacco: Never Used  Vaping Use  . Vaping Use: Never used  Substance and Sexual Activity  .  Alcohol use: No  . Drug use: No  . Sexual activity: Not Currently    Birth control/protection: None  Other Topics Concern  . Not on file  Social History Narrative   Lives with spouse and 2 children   Right Handed   Drinks no caffeine   Social Determinants of Health   Financial Resource Strain: Not on file  Food Insecurity: Not on file  Transportation Needs: Not on file  Physical Activity: Not on file  Stress: Not on file  Social Connections: Not on file  Intimate Partner Violence: Not on file    Family History  Problem Relation Age of Onset  . Hypertension Mother   . Hyperlipidemia Mother   . Colon polyps Mother   . Hypertension Father   . Colon polyps Father   . Hypertension Maternal Grandmother   . Diabetes Maternal Grandmother   . Deep vein thrombosis Maternal Grandmother   . Diabetes Paternal Grandmother   . COPD Paternal Grandfather   .  Heart disease Paternal Grandfather   . Colon cancer Other        maternal great uncle  . Stroke Maternal Aunt   . Migraines Maternal Aunt   . Deep vein thrombosis Sister   . Other Daughter        pulmonary valve stenosis       Objective: There were no vitals filed for this visit.   Physical Exam  Lab Results:  No results found for this or any previous visit (from the past 24 hour(s)).  BMET No results for input(s): NA, K, CL, CO2, GLUCOSE, BUN, CREATININE, CALCIUM in the last 72 hours. PSA No results found for: PSA No results found for: TESTOSTERONE  Cystoscopy was done after a betadine prep.   Urethra: normal. Bladder: no mucosal lesions or stones. Ureteral orifices: normal location and configuration.  Complications: none.   Studies/Results: No results found.    Assessment & Plan: Microhematuria with proteinuria.   GU w/u is negative apart from a possible small left renal cyst but it is indeterminate on CT.   I will send the UA for micro and refer her to nephrology for further evaluation.   I will have her  return to see me in 1 year.   Renal lesion is indeterminate so if the hematuria persists at f/u an MRI would be worthwhile.      No orders of the defined types were placed in this encounter.    No orders of the defined types were placed in this encounter.     No follow-ups on file.   CC: [name], MD      Irine Seal 11/18/2020

## 2020-11-19 ENCOUNTER — Ambulatory Visit: Payer: 59 | Admitting: Urology

## 2020-11-19 DIAGNOSIS — N281 Cyst of kidney, acquired: Secondary | ICD-10-CM

## 2020-11-24 ENCOUNTER — Telehealth: Payer: 59 | Admitting: Family Medicine

## 2020-11-24 ENCOUNTER — Other Ambulatory Visit: Payer: Self-pay

## 2020-11-24 ENCOUNTER — Encounter: Payer: Self-pay | Admitting: Family Medicine

## 2020-11-24 VITALS — Ht 64.0 in | Wt 226.0 lb

## 2020-11-24 DIAGNOSIS — E559 Vitamin D deficiency, unspecified: Secondary | ICD-10-CM

## 2020-11-24 DIAGNOSIS — E270 Other adrenocortical overactivity: Secondary | ICD-10-CM | POA: Diagnosis not present

## 2020-11-24 DIAGNOSIS — R519 Headache, unspecified: Secondary | ICD-10-CM | POA: Diagnosis not present

## 2020-11-24 NOTE — Progress Notes (Signed)
[facility] Banding Note:    Pleasant 35 year old female with history of symptomatic hemorrhoids, s/p recent colonoscopy on file with non-bleeding internal hemorrhoids. Prior banding in 2015. Underwent left lateral banding in Jan 2022. Here for additional banding today of Grade 2 hemorrhoids. Dealing with constipation currently despite fiber and increased water intake.    The patient presents with symptomatic grade 2 hemorrhoids, unresponsive to maximal medical therapy, requesting rubber band ligation of her hemorrhoidal disease. All risks, benefits, and alternative forms of therapy were described and informed consent was obtained.  The decision was made to band the right posterior internal hemorrhoid, but there was insufficient tissue. Attention was placed to the right anterior hemorrhoid and the Chunky was used to perform band ligation without complication. Digital anorectal examination was then performed to assure proper positioning of the band, and to adjust the banded tissue as required. The patient was discharged home without pain or other issues. Dietary and behavioral recommendations were given along with Linzess 145 mcg samples and with follow-up instructions. The patient will return in 2-3 weeks for followup and possible additional banding as required.  No complications were encountered and the patient tolerated the procedure well.   Annitta Needs, PhD, ANP-BC [facility]

## 2020-11-24 NOTE — Patient Instructions (Addendum)
F/U in 7 months, call if you need me before  No medication changes  It is important that you exercise regularly at least 30 minutes 5 times a week. If you develop chest pain, have severe difficulty breathing, or feel very tired, stop exercising immediately and seek medical attention   Think about what you will eat, plan ahead. Choose " clean, green, fresh or frozen" over canned, processed or packaged foods which are more sugary, salty and fatty. 70 to 75% of food eaten should be vegetables and fruit. Three meals at set times with snacks allowed between meals, but they must be fruit or vegetables. Aim to eat over a 12 hour period , example 7 am to 7 pm, and STOP after  your last meal of the day. Drink water,generally about 64 ounces per day, no other drink is as healthy. Fruit juice is best enjoyed in a healthy way, by EATING the fruit.    Calorie Counting for Weight Loss Calories are units of energy. Your body needs a certain number of calories from food to keep going throughout the day. When you eat or drink more calories than your body needs, your body stores the extra calories mostly as fat. When you eat or drink fewer calories than your body needs, your body burns fat to get the energy it needs. Calorie counting means keeping track of how many calories you eat and drink each day. Calorie counting can be helpful if you need to lose weight. If you eat fewer calories than your body needs, you should lose weight. Ask your health care provider what a healthy weight is for you. For calorie counting to work, you will need to eat the right number of calories each day to lose a healthy amount of weight per week. A dietitian can help you figure out how many calories you need in a day and will suggest ways to reach your calorie goal.  A healthy amount of weight to lose each week is usually 1-2 lb (0.5-0.9 kg). This usually means that your daily calorie intake should be reduced by 500-750  calories.  Eating 1,200-1,500 calories a day can help most women lose weight.  Eating 1,500-1,800 calories a day can help most men lose weight. What do I need to know about calorie counting? Work with your health care provider or dietitian to determine how many calories you should get each day. To meet your daily calorie goal, you will need to:  Find out how many calories are in each food that you would like to eat. Try to do this before you eat.  Decide how much of the food you plan to eat.  Keep a food log. Do this by writing down what you ate and how many calories it had. To successfully lose weight, it is important to balance calorie counting with a healthy lifestyle that includes regular activity. Where do I find calorie information? The number of calories in a food can be found on a Nutrition Facts label. If a food does not have a Nutrition Facts label, try to look up the calories online or ask your dietitian for help. Remember that calories are listed per serving. If you choose to have more than one serving of a food, you will have to multiply the calories per serving by the number of servings you plan to eat. For example, the label on a package of bread might say that a serving size is 1 slice and that there are 90 calories in  a serving. If you eat 1 slice, you will have eaten 90 calories. If you eat 2 slices, you will have eaten 180 calories.   How do I keep a food log? After each time that you eat, record the following in your food log as soon as possible:  What you ate. Be sure to include toppings, sauces, and other extras on the food.  How much you ate. This can be measured in cups, ounces, or number of items.  How many calories were in each food and drink.  The total number of calories in the food you ate. Keep your food log near you, such as in a pocket-sized notebook or on an app or website on your mobile phone. Some programs will calculate calories for you and show you how  many calories you have left to meet your daily goal. What are some portion-control tips?  Know how many calories are in a serving. This will help you know how many servings you can have of a certain food.  Use a measuring cup to measure serving sizes. You could also try weighing out portions on a kitchen scale. With time, you will be able to estimate serving sizes for some foods.  Take time to put servings of different foods on your favorite plates or in your favorite bowls and cups so you know what a serving looks like.  Try not to eat straight from a food's packaging, such as from a bag or box. Eating straight from the package makes it hard to see how much you are eating and can lead to overeating. Put the amount you would like to eat in a cup or on a plate to make sure you are eating the right portion.  Use smaller plates, glasses, and bowls for smaller portions and to prevent overeating.  Try not to multitask. For example, avoid watching TV or using your computer while eating. If it is time to eat, sit down at a table and enjoy your food. This will help you recognize when you are full. It will also help you be more mindful of what and how much you are eating. What are tips for following this plan? Reading food labels  Check the calorie count compared with the serving size. The serving size may be smaller than what you are used to eating.  Check the source of the calories. Try to choose foods that are high in protein, fiber, and vitamins, and low in saturated fat, trans fat, and sodium. Shopping  Read nutrition labels while you shop. This will help you make healthy decisions about which foods to buy.  Pay attention to nutrition labels for low-fat or fat-free foods. These foods sometimes have the same number of calories or more calories than the full-fat versions. They also often have added sugar, starch, or salt to make up for flavor that was removed with the fat.  Make a grocery list of  lower-calorie foods and stick to it. Cooking  Try to cook your favorite foods in a healthier way. For example, try baking instead of frying.  Use low-fat dairy products. Meal planning  Use more fruits and vegetables. One-half of your plate should be fruits and vegetables.  Include lean proteins, such as chicken, [place], and fish. Lifestyle Each week, aim to do one of the following:  150 minutes of moderate exercise, such as walking.  75 minutes of vigorous exercise, such as running. General information  Know how many calories are in the foods you  eat most often. This will help you calculate calorie counts faster.  Find a way of tracking calories that works for you. Get creative. Try different apps or programs if writing down calories does not work for you. What foods should I eat?  Eat nutritious foods. It is better to have a nutritious, high-calorie food, such as an avocado, than a food with few nutrients, such as a bag of potato chips.  Use your calories on foods and drinks that will fill you up and will not leave you hungry soon after eating. ? Examples of foods that fill you up are nuts and nut butters, vegetables, lean proteins, and high-fiber foods such as whole grains. High-fiber foods are foods with more than 5 g of fiber per serving.  Pay attention to calories in drinks. Low-calorie drinks include water and unsweetened drinks. The items listed above may not be a complete list of foods and beverages you can eat. Contact a dietitian for more information.   What foods should I limit? Limit foods or drinks that are not good sources of vitamins, minerals, or protein or that are high in unhealthy fats. These include:  Candy.  Other sweets.  Sodas, specialty coffee drinks, alcohol, and juice. The items listed above may not be a complete list of foods and beverages you should avoid. Contact a dietitian for more information. How do I count calories when eating out?  Pay  attention to portions. Often, portions are much larger when eating out. Try these tips to keep portions smaller: ? Consider sharing a meal instead of getting your own. ? If you get your own meal, eat only half of it. Before you start eating, ask for a container and put half of your meal into it. ? When available, consider ordering smaller portions from the menu instead of full portions.  Pay attention to your food and drink choices. Knowing the way food is cooked and what is included with the meal can help you eat fewer calories. ? If calories are listed on the menu, choose the lower-calorie options. ? Choose dishes that include vegetables, fruits, whole grains, low-fat dairy products, and lean proteins. ? Choose items that are boiled, broiled, grilled, or steamed. Avoid items that are buttered, battered, fried, or served with cream sauce. Items labeled as crispy are usually fried, unless stated otherwise. ? Choose water, low-fat milk, unsweetened iced tea, or other drinks without added sugar. If you want an alcoholic beverage, choose a lower-calorie option, such as a glass of wine or light beer. ? Ask for dressings, sauces, and syrups on the side. These are usually high in calories, so you should limit the amount you eat. ? If you want a salad, choose a garden salad and ask for grilled meats. Avoid extra toppings such as bacon, cheese, or fried items. Ask for the dressing on the side, or ask for olive oil and vinegar or lemon to use as dressing.  Estimate how many servings of a food you are given. Knowing serving sizes will help you be aware of how much food you are eating at restaurants. Where to find more information  Centers for Disease Control and Prevention: http://www.wolf.info/  [place]. Department of Agriculture: http://www.wilson-mendoza.org/ Summary  Calorie counting means keeping track of how many calories you eat and drink each day. If you eat fewer calories than your body needs, you should lose weight.  A  healthy amount of weight to lose per week is usually 1-2 lb (0.5-0.9 kg). This usually  means reducing your daily calorie intake by 500-750 calories.  The number of calories in a food can be found on a Nutrition Facts label. If a food does not have a Nutrition Facts label, try to look up the calories online or ask your dietitian for help.  Use smaller plates, glasses, and bowls for smaller portions and to prevent overeating.  Use your calories on foods and drinks that will fill you up and not leave you hungry shortly after a meal. This information is not intended to replace advice given to you by your health care provider. Make sure you discuss any questions you have with your health care provider. Document Revised: 11/07/2019 Document Reviewed: 11/07/2019 Elsevier Patient Education  2021 [employer].

## 2020-11-24 NOTE — Progress Notes (Signed)
Virtual Visit via Telephone Note  I connected with Janet [name] on 11/24/20 at  3:40 PM EST by telephone and verified that I am speaking with the correct person using two identifiers.  Location: Patient: home Provider: office   I discussed the limitations, risks, security and privacy concerns of performing an evaluation and management service by telephone and the availability of in person appointments. I also discussed with the patient that there may be a patient responsible charge related to this service. The patient expressed understanding and agreed to proceed.   History of Present Illness: One fioricet tab does relieve headache and intolerant of topamax, is aware she may need surgical intervention for the malformation , bad headache once weekly, bad ones needing one fioricet for relief Slow eight loss despite significant dietary restriction. Contemplating bariatric surgery but may want another baby, so hesitant Ibuprofen helped the leg pain   Observations/Objective: Ht 5\' 4"  (1.626 m)   Wt 226 lb (102.5 kg)   LMP 11/08/2020 (Approximate)   BMI 38.79 kg/m  Good communication with no confusion and intact memory. Alert and oriented x 3 No signs of respiratory distress during speech    Assessment and Plan: Vitamin D deficiency Continue weekly vit D Updated lab needed at/ before next visit.   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  The importance of healthy food choices with portion control discussed, as well as eating regularly and within a 12 hour window most days. The need  to choose "clean , green" food 50 to 75% of the time is discussed, as well as to make water the primary drink and set a goal of 64 ounces water daily.    Weight /BMI 11/26/2020 11/25/2020 11/24/2020  WEIGHT 234 lb 235 lb 9.6 oz 226 lb  HEIGHT 5' 3.25" 5\' 4"  5\' 4"   BMI 41.12 kg/m2 40.44 kg/m2 38.79 kg/m2        Follow Up Instructions:    I discussed the assessment and treatment plan with the patient. The patient was provided an opportunity to ask questions and all were answered. The patient agreed with the plan and demonstrated an understanding of the instructions.   The patient was advised to call back or seek an in-person evaluation if the symptoms worsen or if the condition fails to improve as anticipated.  I provided 18 minutes of non-face-to-face time during this encounter.   Tula Nakayama, MD

## 2020-11-25 ENCOUNTER — Other Ambulatory Visit: Payer: Self-pay

## 2020-11-25 ENCOUNTER — Ambulatory Visit: Payer: 59 | Admitting: Gastroenterology

## 2020-11-25 ENCOUNTER — Encounter: Payer: Self-pay | Admitting: Gastroenterology

## 2020-11-25 DIAGNOSIS — K641 Second degree hemorrhoids: Secondary | ICD-10-CM | POA: Diagnosis not present

## 2020-11-25 DIAGNOSIS — K5900 Constipation, unspecified: Secondary | ICD-10-CM | POA: Insufficient documentation

## 2020-11-25 NOTE — Patient Instructions (Signed)
For constipation: let's try Linzess 1 capsule 30 minutes before breakfast daily.  It is normal to have some looser stool starting out for the first few days, but this should improve. If it does not, please call us, as we will need to adjust the dosage. We may need to go up or down with this. Let me know what you think about the dosing!  We will see you in a few weeks for possible banding.  I would recommend holding off on fiber bars right now and see how you feel.  I enjoyed seeing you again today! As you know, I value our relationship and want to provide genuine, compassionate, and quality care. I welcome your feedback. If you receive a survey regarding your visit,  I greatly appreciate you taking time to fill this out. See you next time!  Annitta Needs, PhD, ANP-BC [facility]

## 2020-11-26 ENCOUNTER — Ambulatory Visit (INDEPENDENT_AMBULATORY_CARE_PROVIDER_SITE_OTHER): Payer: 59 | Admitting: Adult Health

## 2020-11-26 ENCOUNTER — Encounter: Payer: Self-pay | Admitting: Adult Health

## 2020-11-26 ENCOUNTER — Other Ambulatory Visit (HOSPITAL_COMMUNITY)
Admission: RE | Admit: 2020-11-26 | Discharge: 2020-11-26 | Disposition: A | Discharge status: Home / Self Care | Payer: 59 | Source: Ambulatory Visit | Attending: Adult Health | Admitting: Adult Health

## 2020-11-26 VITALS — BP 128/80 | HR 78 | Ht 63.25 in | Wt 234.0 lb

## 2020-11-26 DIAGNOSIS — Z01419 Encounter for gynecological examination (general) (routine) without abnormal findings: Secondary | ICD-10-CM | POA: Diagnosis not present

## 2020-11-26 NOTE — Progress Notes (Signed)
Patient ID: Janet [name], female   DOB: [birth year], 35 y.o.   MRN: [id number] History of Present Illness:  Janet [name] is a 35 year old black female, married, G2P2 in for well woman gyn exam and pap. She had hemorrhoids banded yesterday. She is going o go to [employer] [facility]  PCP is Dr Moshe Cipro  Current Medications, Allergies, Past Medical History, Past Surgical History, Family History and Social History were reviewed in [employer] record.     Review of Systems: Patient denies any hearing loss, fatigue, blurred vision, shortness of breath, chest pain, abdominal pain, problems with bowel movements, urination, or intercourse. No joint pain or mood swings. +weight gain  Has headaches and sees Dr Jannifer Franklin.    Physical Exam:BP 128/80 (BP Location: Left Arm, Patient Position: Sitting, Cuff Size: Large)   Pulse 78   Ht 5' 3.25" (1.607 m)   Wt 234 lb (106.1 kg)   LMP 11/06/2020   Breastfeeding No   BMI 41.12 kg/m  General:  Well developed, well nourished, no acute distress Skin:  Warm and dry Neck:  Midline trachea, normal thyroid, good ROM, no lymphadenopathy Lungs; Clear to auscultation bilaterally Breast:  No dominant palpable mass, retraction, or nipple discharge Cardiovascular: Regular rate and rhythm Abdomen:  Soft, non tender, no hepatosplenomegaly Pelvic:  External genitalia is normal in appearance, no lesions.  The vagina is normal in appearance,spotting pink. Urethra has no lesions or masses. The cervix is bulbous.pap with HRHPV genotyping performed.  Uterus is felt to be normal size, shape, and contour.  No adnexal masses or tenderness noted.Bladder is non tender, no masses felt. Extremities/musculoskeletal:  No swelling or varicosities noted, no clubbing or cyanosis Psych:  No mood changes, alert and cooperative,seems happy AA is 0 Fall risk is low PHQ 9 score is 0 GAD 7 score is 0  [facility] - 11/26/20 1356      Pregnancy Intention  Screening   Does the patient want to become pregnant in the next year? No    Does the patient's partner want to become pregnant in the next year? No    Would the patient like to discuss contraceptive options today? Yes      Contraception Wrap Up   Current Method Abstinence    End Method Female Condom    Contraception Counseling Provided Yes         Examination chaperoned by Levy Pupa LPN  Impression and Plan: 1. Encounter for gynecological examination with Papanicolaou smear of cervix Pap sent Physical in 1 year Pap in 3 if normal Labs with PCP Mammogram at 50 Discussed paragard IUD, condoms and phexxi and she will use condoms,she says

## 2020-11-29 ENCOUNTER — Encounter: Payer: Self-pay | Admitting: Family Medicine

## 2020-11-29 NOTE — Assessment & Plan Note (Signed)
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

## 2020-11-29 NOTE — Assessment & Plan Note (Signed)
  Patient re-educated about  the importance of commitment to a  minimum of 150 minutes of exercise per week as able.  The importance of healthy food choices with portion control discussed, as well as eating regularly and within a 12 hour window most days. The need to choose "clean , green" food 50 to 75% of the time is discussed, as well as to make water the primary drink and set a goal of 64 ounces water daily.    Weight /BMI 11/26/2020 11/25/2020 11/24/2020  WEIGHT 234 lb 235 lb 9.6 oz 226 lb  HEIGHT 5' 3.25" 5\' 4"  5\' 4"   BMI 41.12 kg/m2 40.44 kg/m2 38.79 kg/m2

## 2020-11-29 NOTE — Assessment & Plan Note (Signed)
Continue weekly vit D Updated lab needed at/ before next visit.  

## 2020-11-29 NOTE — Assessment & Plan Note (Signed)
Recent endo eval to see if this is contributing to her obesity, no new management strategy

## 2020-12-01 LAB — CYTOLOGY - PAP
Comment: NEGATIVE
Diagnosis: NEGATIVE
High risk HPV: NEGATIVE

## 2020-12-07 ENCOUNTER — Encounter: Payer: Self-pay | Admitting: Podiatry

## 2020-12-07 ENCOUNTER — Other Ambulatory Visit: Payer: Self-pay

## 2020-12-07 ENCOUNTER — Ambulatory Visit: Payer: 59 | Admitting: Podiatry

## 2020-12-07 DIAGNOSIS — M722 Plantar fascial fibromatosis: Secondary | ICD-10-CM | POA: Diagnosis not present

## 2020-12-07 NOTE — Progress Notes (Signed)
  Subjective:  Patient ID: Janet [name], female    DOB: 09-07-1986,  MRN: [id number]  Chief Complaint  Patient presents with  . Plantar Fasciitis    Pt stated that the pain is starting to come back on the heels    35 y.o. female returns for follow-up with the above complaint. History confirmed with patient.  She was doing much better but recently returned.  She has been using the plantar fascial braces which have helped some.  She is taking meloxicam as needed.  Objective:  Physical Exam: warm, good capillary refill, no trophic changes or ulcerative lesions, normal DP and PT pulses and normal sensory exam. Bilateral sharp heel pain at the plantar insertion of the plantar fascia on the calcaneus   Radiographs: X-ray of both feet: no fracture, dislocation, swelling or degenerative changes noted, hallux valgus deformity and pes planus Assessment:   1. Plantar fasciitis of left foot   2. Plantar fasciitis of right foot      Plan:  Patient was evaluated and treated and all questions answered.  -Unfortunate she has had some recurrence.  We discussed further treatment of chronic plantar fasciitis including injection therapy, physical therapy, bracing anti-inflammatories and advanced treatment such as shockwave or radiofrequency ablation for surgical intervention.  I recommended she return to her bracing at this point.  She would like to avoid further injections right now.  She will resume taking meloxicam and do her stretching exercises.  We discussed shockwave in quite a bit of detail today, she will consider this for the future.  She is not much better by next visit we will consider MRIs to evaluate for further treatment  Return in about 1 month (around 01/04/2021).

## 2020-12-08 ENCOUNTER — Other Ambulatory Visit: Payer: Self-pay

## 2020-12-08 ENCOUNTER — Encounter (INDEPENDENT_AMBULATORY_CARE_PROVIDER_SITE_OTHER): Payer: Self-pay | Admitting: Family Medicine

## 2020-12-08 ENCOUNTER — Ambulatory Visit (INDEPENDENT_AMBULATORY_CARE_PROVIDER_SITE_OTHER): Payer: 59 | Admitting: Family Medicine

## 2020-12-08 VITALS — BP 137/85 | HR 75 | Temp 98.0°F | Ht 63.0 in | Wt 231.0 lb

## 2020-12-08 DIAGNOSIS — E559 Vitamin D deficiency, unspecified: Secondary | ICD-10-CM | POA: Diagnosis not present

## 2020-12-08 DIAGNOSIS — R519 Headache, unspecified: Secondary | ICD-10-CM

## 2020-12-08 DIAGNOSIS — R5383 Other fatigue: Secondary | ICD-10-CM

## 2020-12-08 DIAGNOSIS — K219 Gastro-esophageal reflux disease without esophagitis: Secondary | ICD-10-CM | POA: Diagnosis not present

## 2020-12-08 DIAGNOSIS — Z6841 Body Mass Index (BMI) 40.0 and over, adult: Secondary | ICD-10-CM

## 2020-12-08 DIAGNOSIS — F32A Depression, unspecified: Secondary | ICD-10-CM | POA: Insufficient documentation

## 2020-12-08 DIAGNOSIS — G8929 Other chronic pain: Secondary | ICD-10-CM

## 2020-12-08 DIAGNOSIS — I10 Essential (primary) hypertension: Secondary | ICD-10-CM

## 2020-12-08 DIAGNOSIS — R0602 Shortness of breath: Secondary | ICD-10-CM | POA: Diagnosis not present

## 2020-12-08 DIAGNOSIS — Z9189 Other specified personal risk factors, not elsewhere classified: Secondary | ICD-10-CM | POA: Diagnosis not present

## 2020-12-08 DIAGNOSIS — Z0289 Encounter for other administrative examinations: Secondary | ICD-10-CM

## 2020-12-08 DIAGNOSIS — D649 Anemia, unspecified: Secondary | ICD-10-CM

## 2020-12-08 DIAGNOSIS — E249 Cushing's syndrome, unspecified: Secondary | ICD-10-CM | POA: Insufficient documentation

## 2020-12-09 LAB — COMPREHENSIVE METABOLIC PANEL
ALT: 8 IU/L (ref 0–32)
AST: 18 IU/L (ref 0–40)
Albumin/Globulin Ratio: 1.3 (ref 1.2–2.2)
Albumin: 4.1 g/dL (ref 3.8–4.8)
Alkaline Phosphatase: 80 IU/L (ref 44–121)
BUN/Creatinine Ratio: 12 (ref 9–23)
BUN: 9 mg/dL (ref 6–20)
Bilirubin Total: 0.4 mg/dL (ref 0.0–1.2)
CO2: 21 mmol/L (ref 20–29)
Calcium: 9 mg/dL (ref 8.7–10.2)
Chloride: 103 mmol/L (ref 96–106)
Creatinine, Ser: 0.77 mg/dL (ref 0.57–1.00)
Globulin, Total: 3.1 g/dL (ref 1.5–4.5)
Glucose: 90 mg/dL (ref 65–99)
Potassium: 4.2 mmol/L (ref 3.5–5.2)
Sodium: 140 mmol/L (ref 134–144)
Total Protein: 7.2 g/dL (ref 6.0–8.5)
eGFR: 103 mL/min/{1.73_m2} (ref >59)

## 2020-12-09 LAB — CBC WITH DIFFERENTIAL/PLATELET
Basophils Absolute: 0 10*3/uL (ref 0.0–0.2)
Basos: 1 %
EOS (ABSOLUTE): 0.2 10*3/uL (ref 0.0–0.4)
Eos: 3 %
Hematocrit: 40.2 % (ref 34.0–46.6)
Hemoglobin: 12.8 g/dL (ref 11.1–15.9)
Immature Grans (Abs): 0 10*3/uL (ref 0.0–0.1)
Immature Granulocytes: 0 %
Lymphocytes Absolute: 1.7 10*3/uL (ref 0.7–3.1)
Lymphs: 34 %
MCH: 26.3 pg — ABNORMAL LOW (ref 26.6–33.0)
MCHC: 31.8 g/dL (ref 31.5–35.7)
MCV: 83 fL (ref 79–97)
Monocytes Absolute: 0.3 10*3/uL (ref 0.1–0.9)
Monocytes: 6 %
Neutrophils Absolute: 2.8 10*3/uL (ref 1.4–7.0)
Neutrophils: 56 %
Platelets: 411 10*3/uL (ref 150–450)
RBC: 4.86 x10E6/uL (ref 3.77–5.28)
RDW: 13.1 % (ref 11.7–15.4)
WBC: 5 10*3/uL (ref 3.4–10.8)

## 2020-12-09 LAB — LIPID PANEL
Chol/HDL Ratio: 3.7 ratio (ref 0.0–4.4)
Cholesterol, Total: 180 mg/dL (ref 100–199)
HDL: 49 mg/dL (ref >39)
LDL Chol Calc (NIH): 118 mg/dL — ABNORMAL HIGH (ref 0–99)
Triglycerides: 70 mg/dL (ref 0–149)
VLDL Cholesterol Cal: 13 mg/dL (ref 5–40)

## 2020-12-09 LAB — INSULIN, RANDOM: INSULIN: 17.9 u[IU]/mL (ref 2.6–24.9)

## 2020-12-09 LAB — VITAMIN D 25 HYDROXY (VIT D DEFICIENCY, FRACTURES): Vit D, 25-Hydroxy: 17.5 ng/mL — ABNORMAL LOW (ref 30.0–100.0)

## 2020-12-09 LAB — HEMOGLOBIN A1C
Est. average glucose Bld gHb Est-mCnc: 131 mg/dL
Hgb A1c MFr Bld: 6.2 % — ABNORMAL HIGH (ref 4.8–5.6)

## 2020-12-09 LAB — VITAMIN B12: Vitamin B-12: 563 pg/mL (ref 232–1245)

## 2020-12-09 LAB — TSH: TSH: 0.908 u[IU]/mL (ref 0.450–4.500)

## 2020-12-09 LAB — FOLATE: Folate: 12.6 ng/mL (ref >3.0)

## 2020-12-09 LAB — T4, FREE: Free T4: 1.26 ng/dL (ref 0.82–1.77)

## 2020-12-09 LAB — T3: T3, Total: 181 ng/dL — ABNORMAL HIGH (ref 71–180)

## 2020-12-09 NOTE — Progress Notes (Signed)
Dear Dr. Moshe Cipro,   Thank you for referring Janet [name] to our clinic. The following note includes my evaluation and treatment recommendations.  Chief Complaint:   OBESITY Janet [name] (MR# [id number]) is a 35 y.o. female who presents for evaluation and treatment of obesity and related comorbidities. Current BMI is Body mass index is 40.92 kg/m. Janet [name] has been struggling with her weight for many years and has been unsuccessful in either losing weight, maintaining weight loss, or reaching her healthy weight goal.  Janet [name] is currently in the action stage of change and ready to dedicate time achieving and maintaining a healthier weight. Janet [name] is interested in becoming our patient and working on intensive lifestyle modifications including (but not limited to) diet and exercise for weight loss.  Janet [name] works full time.  She is married to her husband, Janet [name].  Lives with her mother and 2 children as well.  Drinks regular soda daily.  Craves in the mornings.  Sometimes skips dinner.  Janet [name]'s habits were reviewed today and are as follows: Her family eats meals together, she thinks her family will eat healthier with her, her desired weight loss is 105 pounds, she started gaining weight in 2010-2011, her heaviest weight ever was 240 pounds, she craves milk chocolate almonds, she skips dinner sometimes, she is frequently drinking liquids with calories and she struggles with emotional eating.  Depression Screen Janet [name]'s Food and Mood (modified PHQ-9) score was 9.  Depression screen PHQ 2/9 12/08/2020  Decreased Interest 2  Down, Depressed, Hopeless 2  PHQ - 2 Score 4  Altered sleeping 1  Tired, decreased energy 3  Change in appetite 1  Feeling bad or failure about yourself  0  Trouble concentrating 0  Moving slowly or fidgety/restless 0  Suicidal thoughts 0  PHQ-9 Score 9  Difficult doing work/chores Somewhat difficult  Some recent data might be hidden    Assessment/Plan:   1. Other fatigue Janet [name] admits to daytime somnolence and reports waking up still tired. Patent has a history of symptoms of daytime fatigue, morning fatigue and morning headache. Janet [name] generally gets 4 or 5 hours of sleep per night, and states that she has generally restful sleep. Snoring is not present. Apneic episodes are not present. Epworth Sleepiness Score is 17. She had an OSA evaluation in 2013 and was within normal limits and showed no sleep apnea.  She is without concerns today regarding sleep issues.  Janet [name] does feel that her weight is causing her energy to be lower than it should be. Fatigue may be related to obesity, depression or many other causes. Labs will be ordered, and in the meanwhile, Janet [name] will focus on self care including making healthy food choices, increasing physical activity and focusing on stress reduction.  Will check labs and EKG today.  - EKG 12-Lead - CBC with Differential/Platelet - Comprehensive metabolic panel - Hemoglobin A1c - Insulin, random - Lipid panel - T3 - T4, free - TSH  2. SOBOE (shortness of breath on exertion) Janet [name] notes increasing shortness of breath with exercising and seems to be worsening over time with weight gain. She notes getting out of breath sooner with activity than she used to. This has gotten worse recently. Janet [name] denies shortness of breath at rest or orthopnea.  Janet [name] does feel that she gets out of breath more easily that she used to when she exercises. Janet [name]'s shortness of breath appears to be obesity related and exercise induced. She has agreed to work on [employer]  loss and gradually increase exercise to treat her exercise induced shortness of breath. Will continue to monitor closely.  Will check IC and labs today.  - CBC with Differential/Platelet - Comprehensive metabolic panel - Hemoglobin A1c - Insulin, random - Lipid panel - T3 - T4, free - TSH  3. Gastroesophageal reflux  disease, unspecified whether esophagitis present Janet [name] is taking Protonix 40 mg daily for GERD.  Symptoms are well controlled.  Plan: Discussed treatment options including over-the-counter medications and prescription ones with patient.  Will Refill medications if needed.  Orders and further plan of care to follow.  Lifestyle modifications are the first line treatment for this issue. Some of these include not eating within 2-3 hours of lying down, avoiding trigger foods, decrease caffeine intake, decrease abdominal girth, no tobacco cessation (if applicable).  Janet [name] will continue to work on diet, exercise and weight loss efforts.  Orders and follow up as documented in patient record.  4. 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 Medications: None.  She sees Dr. Loanne Drilling in Endocrinology.  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  Plan:   - Blood pressure not at goal in office today.  - Will closely monitor.  Check labs today.  - Counseled Janet [name] and discussed treatment plan, which always includes dietary and lifestyle modification as first line.  - Lifestyle changes such as following our low salt, heart healthy meal plan and engaging in a regular exercise program discussed extensively with patient.  - Avoid buying foods that are: processed, frozen, or prepackaged to avoid excess salt. - Ambulatory blood pressure monitoring encouraged at least 2-3 times weekly.  Keep log and bring to office visit.  Reminded patient that if they ever feel poorly in any way, to check their blood pressure and pulse as well. - We will continue to monitor closely alongside PCP/ specialists.  Pt reminded to also f/up with those individuals as instructed by them.  - We will continue to monitor symptoms as they relate to the her weight loss journey.   BP Readings  from Last 3 Encounters:  12/08/20 137/85  11/26/20 128/80  11/25/20 (!) 149/91   - CBC with Differential/Platelet - Comprehensive metabolic panel - Hemoglobin A1c - Insulin, random - Lipid panel - T3 - T4, free - TSH  5. Anemia, unspecified type Janet [name] has history of iron deficiency anemia.  She is taking 2 multivitamin per day.  Plan:  Will check labs today, as per below.  Continue multivitamin.  Nutrition: Iron-rich foods include dark leafy greens, red and white meats, eggs, seafood, and beans.  Certain foods and drinks prevent your body from absorbing iron properly. Avoid eating these foods in the same meal as iron-rich foods or with iron supplements. These foods include: coffee, black tea, and red wine; milk, dairy products, and foods that are high in calcium; beans and soybeans; whole grains. Constipation can be a side effect of iron supplementation. Increased water and fiber intake are helpful. Water goal: > 2 liters/day. Fiber goal: > 25 grams/day.  - Vitamin B12 - Folate  6. Vitamin D deficiency Not at goal. Current vitamin D is 16.0, tested on 03/20/2020. Optimal goal > 50 ng/dL.  She is taking vitamin D 50,000 IU weekly.  Plan: Continue to take prescription Vitamin D @50 ,000 IU every week as prescribed.  Will check vitamin D level today.  -  VITAMIN D 25 Hydroxy (Vit-D Deficiency, Fractures)  7. Chronic nonintractable headache, unspecified headache type Followed by Neurology, Dr. Jannifer Franklin.  History of Budd-Chiari malformation type I.  Failed use of Topamax due to side effects of med on pt.  Plan:  Treatment per Neurology.  Symptoms are currently stable.  8. Depression with emotional eating Medication: None.  Denies feelings of depression.  She has some mild emotional eating habits.  PHQ-9 is 9.  Plan:  Behavior modification techniques were discussed today to help deal with emotional/non-hunger eating behaviors.  9. At risk for impaired metabolic function Due to  Janet [name]'s current state of health and medical condition(s), she is at a significantly higher risk for impaired metabolic function.  This places the patient at a much greater risk to subsequently develop cardiopulmonary conditions that can negatively affect the patient's quality of life.  At least 15 minutes was spent on counseling Karmon about these concerns today, and I stressed the importance of reversing these risks factors.  The initial goal is to lose at least 5-10% of starting weight to help reduce risk factors.  Counseling:  Intensive lifestyle modifications discussed with Janet [name] as the most appropriate first line treatment.  she will continue to work on diet, exercise, and weight loss efforts.  We will continue to reassess these conditions on a fairly regular basis in an attempt to decrease the patient's overall morbidity and mortality.  10. Class 3 severe obesity with serious comorbidity and body mass index (BMI) of 40.0 to 44.9 in adult, unspecified obesity type (HCC)  Janet [name] is currently in the action stage of change and her goal is to continue with weight loss efforts. I recommend Janet [name] begin the structured treatment plan as follows:  She has agreed to the Category 3 Plan.   Exercise goals: As is.   Behavioral modification strategies: meal planning and cooking strategies, avoiding temptations and planning for success.  She was informed of the importance of frequent follow-up visits to maximize her success with intensive lifestyle modifications for her multiple health conditions. She was informed we would discuss her lab results at her next visit unless there is a critical issue that needs to be addressed sooner. Janet [name] agreed to keep her next visit at the agreed upon time to discuss these results.  Objective:   Blood pressure 137/85, pulse 75, temperature 98 F (36.7 C), height 5\' 3"  (1.6 m), weight 231 lb (104.8 kg), last menstrual period 12/01/2020, SpO2 96 %, not currently  breastfeeding. Body mass index is 40.92 kg/m.  EKG: Normal sinus rhythm, rate 78 bpm.  Indirect Calorimeter completed today shows a VO2 of 313 and a REE of 2176.  Her calculated basal metabolic rate is 7673 thus her basal metabolic rate is better than expected.  General: Cooperative, alert, well developed, in no acute distress. HEENT: Conjunctivae and lids unremarkable. Cardiovascular: Regular rhythm.  Lungs: Normal work of breathing. Neurologic: No focal deficits.   Lab Results  Component Value Date   HGBA1C 5.7 (H) 07/10/2018   HGBA1C 5.6 02/07/2017   HGBA1C 5.7 (H) 01/18/2016   HGBA1C 5.9 (H) 10/29/2013   Lab Results  Component Value Date   IRON 38 (L) 03/20/2020   FERRITIN 27 08/20/2019   Attestation Statements:   This is the patient's first visit at [facility]. The patient's NEW PATIENT PACKET was reviewed at length. Included in the packet: current and past health history, medications, allergies, ROS, gynecologic history (women only), surgical history, family history, social history, weight history,  weight loss surgery history (for those that have had weight loss surgery), nutritional evaluation, mood and food questionnaire, PHQ9, Epworth questionnaire, sleep habits questionnaire, patient life and health improvement goals questionnaire. These will all be scanned into the patient's chart under media.   During the visit, I independently reviewed the patient's EKG, bioimpedance scale results, and indirect calorimeter results. I used this information to tailor a meal plan for the patient that will help her to lose weight and will improve her obesity-related conditions going forward. I performed a medically necessary appropriate examination and/or evaluation. I discussed the assessment and treatment plan with the patient. The patient was provided an opportunity to ask questions and all were answered. The patient agreed with the plan and demonstrated an understanding of  the instructions. Labs were ordered at this visit and will be reviewed at the next visit unless more critical results need to be addressed immediately. Clinical information was updated and documented in the EMR.   I, Water quality scientist, CMA, am acting as Location manager for [employer], DO.  I have reviewed the above documentation for accuracy and completeness, and I agree with the above. Janet [name], D.O.  The [place] was signed into law in 2016 which includes the topic of electronic health records.  This provides immediate access to information in MyChart.  This includes consultation notes, operative notes, office notes, lab results and pathology reports.  If you have any questions about what you read please let us know at your next visit so we can discuss your concerns and take corrective action if need be.  We are right here with you.

## 2020-12-22 ENCOUNTER — Encounter (INDEPENDENT_AMBULATORY_CARE_PROVIDER_SITE_OTHER): Payer: Self-pay | Admitting: Family Medicine

## 2020-12-22 ENCOUNTER — Ambulatory Visit (INDEPENDENT_AMBULATORY_CARE_PROVIDER_SITE_OTHER): Payer: 59 | Admitting: Family Medicine

## 2020-12-22 ENCOUNTER — Other Ambulatory Visit: Payer: Self-pay

## 2020-12-22 VITALS — BP 131/85 | HR 79 | Temp 98.3°F | Ht 63.0 in | Wt 230.0 lb

## 2020-12-22 DIAGNOSIS — R9431 Abnormal electrocardiogram [ECG] [EKG]: Secondary | ICD-10-CM | POA: Diagnosis not present

## 2020-12-22 DIAGNOSIS — E7849 Other hyperlipidemia: Secondary | ICD-10-CM

## 2020-12-22 DIAGNOSIS — E559 Vitamin D deficiency, unspecified: Secondary | ICD-10-CM

## 2020-12-22 DIAGNOSIS — R7303 Prediabetes: Secondary | ICD-10-CM

## 2020-12-22 DIAGNOSIS — E785 Hyperlipidemia, unspecified: Secondary | ICD-10-CM | POA: Insufficient documentation

## 2020-12-22 DIAGNOSIS — R7302 Impaired glucose tolerance (oral): Secondary | ICD-10-CM | POA: Insufficient documentation

## 2020-12-22 DIAGNOSIS — D649 Anemia, unspecified: Secondary | ICD-10-CM

## 2020-12-22 MED ORDER — METFORMIN HCL 500 MG PO TABS: ORAL_TABLET | ORAL | 0 refills | Status: DC

## 2020-12-22 MED ORDER — ERGOCALCIFEROL 1.25 MG (50000 UT) PO CAPS
ORAL_CAPSULE | ORAL | 0 refills | Status: DC
Start: 2020-12-22 — End: 2021-01-05

## 2020-12-30 NOTE — Progress Notes (Signed)
Chief Complaint:   OBESITY Janet [name] is here to discuss her progress with her obesity treatment plan along with follow-up of her obesity related diagnoses.   Today's visit was #: 2 Starting weight: 231 lbs Starting date: 12/08/2020 Today's weight: 230 lbs Today's date: 12/22/2020 Total lbs lost to date: 1 lb Body mass index is 40.74 kg/m.  Total weight loss percentage to date: -0.43%  Interim History:  Janet [name] is here today for her first follow-up office visit since starting the program with [place].  We will be reviewing her NEW Meal Plan and will be discussing all recent labs done here and/ or done at outside facilities.  Extended time was spent counseling Janet [name] on all new disease processes that were discovered or that are worsening.    She is following the meal plan with only minor concerns/ questions today.  Patient's meal and food recall appears to be accurate and consistent with what is on the plan when she is following it.  When on plan, her hunger and cravings are well controlled.    Janet [name] says she did not like the plan.  Aspartame in yogurt gave her headaches.  She says she does not like cottage cheese or eggs (but will eat hard boiled egg whites).  Current Meal Plan: the Category 3 Plan for 25-50% of the time.  Current Exercise Plan: Walking/running for 120 minutes 5 times per week.  Assessment/Plan:   Orders Placed This Encounter  Procedures  . EKG 12-Lead    Medications Discontinued During This Encounter  Medication Reason  . ergocalciferol (VITAMIN D2) 1.25 MG (50000 UT) capsule      Meds ordered this encounter  Medications  . metFORMIN (GLUCOPHAGE) 500 MG tablet    Sig: 1/2 po qd for 4 d, then 1 po qd in am with food    Dispense:  30 tablet    Refill:  0  . ergocalciferol (VITAMIN D2) 1.25 MG (50000 UT) capsule    Sig: 1 po q wed, and 1 po q sun    Dispense:  8 capsule    Refill:  0    Ov for refills     1. Prediabetes Not at goal.  Goal is HgbA1c < 5.7.  Medication: None.  She was on metformin in the past for cushing's/PCOS and it caused weight loss.  Plan:  New.  Discussed labs with patient today.  Start metformin, as per below.  She will continue to focus on protein-rich, low simple carbohydrate foods. We reviewed the importance of hydration, regular exercise for stress reduction, and restorative sleep.  Continue prudent nutritional plan and weight loss.  Lab Results  Component Value Date   HGBA1C 6.2 (H) 12/08/2020   Lab Results  Component Value Date   INSULIN 17.9 12/08/2020   - Start metFORMIN (GLUCOPHAGE) 500 MG tablet; 1/2 po qd for 4 d, then 1 po qd in am with food  Dispense: 30 tablet; Refill: 0  2. Other hyperlipidemia Course: Not at goal. Lipid-lowering medications: None.    Plan:  New.  Discussed labs with patient today.  Dietary changes: Increase soluble fiber, decrease simple carbohydrates, decrease saturated fat. Exercise changes: Moderate to vigorous-intensity aerobic activity 150 minutes per week or as tolerated. We will continue to monitor along with PCP/specialists as it pertains to her weight loss journey.  Lab Results  Component Value Date   CHOL 180 12/08/2020   HDL 49 12/08/2020   LDLCALC 118 (H) 12/08/2020  TRIG 70 12/08/2020   CHOLHDL 3.7 12/08/2020   Lab Results  Component Value Date   ALT 8 12/08/2020   AST 18 12/08/2020   ALKPHOS 80 12/08/2020   BILITOT 0.4 12/08/2020   3. Vitamin D deficiency Not at goal. Current vitamin D is 17.5, tested on 12/08/2020. Optimal goal > 50 ng/dL.  She is taking vitamin D 50,000 IU weekly.  Plan:  Worsening.  Discussed labs with patient today.  Increase prescription Vitamin D @50 ,000 IU to twice per week as prescribed.  Follow-up for routine testing of Vitamin D, at least 2-3 times per year to avoid over-replacement.  - Increase ergocalciferol (VITAMIN D2) 1.25 MG (50000 UT) capsule; 1 po q wed, and 1 po q sun  Dispense: 8 capsule; Refill: 0  4.  Abnormal ECG She is asymptomatic and without concerns.  Plan:  Repeat EKG today.  Discussed findings.  - EKG 12-Lead  5. Anemia, unspecified type- h/o Fe def Era is taking a multivitamin daily.  CBC is stable and within normal limits.  Plan:  Discussed labs with patient today.  Continue multivitamin daily.  Nutrition: Iron-rich foods include dark leafy greens, red and white meats, eggs, seafood, and beans.  Certain foods and drinks prevent your body from absorbing iron properly. Avoid eating these foods in the same meal as iron-rich foods or with iron supplements. These foods include: coffee, black tea, and red wine; milk, dairy products, and foods that are high in calcium; beans and soybeans; whole grains. Constipation can be a side effect of iron supplementation. Increased water and fiber intake are helpful. Water goal: > 2 liters/day. Fiber goal: > 25 grams/day.  CBC Latest Ref Rng & Units 12/08/2020 04/07/2020 03/20/2020  WBC 3.4 - 10.8 x10E3/uL 5.0 - 6.4  Hemoglobin 11.1 - 15.9 g/dL 12.8 13.3 12.4  Hematocrit 34.0 - 46.6 % 40.2 - 37.4  Platelets 150 - 450 x10E3/uL 411 - 364   Lab Results  Component Value Date   IRON 38 (L) 03/20/2020   FERRITIN 27 08/20/2019   Lab Results  Component Value Date   VITAMINB12 563 12/08/2020   6. Class 3 severe obesity with serious comorbidity and body mass index (BMI) of 40.0 to 44.9 in adult, unspecified obesity type (Janet [name])  Course: Janet [name] is currently in the action stage of change. As such, her goal is to continue with weight loss efforts.   Nutrition goals: She has agreed to following a lower carbohydrate, vegetable and lean protein rich diet plan.   Exercise goals: As is.  Behavioral modification strategies: increasing lean protein intake, decreasing simple carbohydrates, meal planning and cooking strategies, keeping healthy foods in the home and planning for success.  Janet [name] has agreed to follow-up with our clinic in 2 weeks. She was  informed of the importance of frequent follow-up visits to maximize her success with intensive lifestyle modifications for her multiple health conditions.   Objective:   Blood pressure 131/85, pulse 79, temperature 98.3 F (36.8 C), height 5\' 3"  (1.6 m), weight 230 lb (104.3 kg), last menstrual period 12/01/2020, SpO2 99 %, not currently breastfeeding. Body mass index is 40.74 kg/m.  General: Cooperative, alert, well developed, in no acute distress. HEENT: Conjunctivae and lids unremarkable. Cardiovascular: Regular rhythm.  Lungs: Normal work of breathing. Neurologic: No focal deficits.   Lab Results  Component Value Date   CREATININE 0.77 12/08/2020   BUN 9 12/08/2020   NA 140 12/08/2020   K 4.2 12/08/2020   CL 103 12/08/2020  CO2 21 12/08/2020   Lab Results  Component Value Date   ALT 8 12/08/2020   AST 18 12/08/2020   ALKPHOS 80 12/08/2020   BILITOT 0.4 12/08/2020   Lab Results  Component Value Date   HGBA1C 6.2 (H) 12/08/2020   HGBA1C 5.7 (H) 07/10/2018   HGBA1C 5.6 02/07/2017   HGBA1C 5.7 (H) 01/18/2016   HGBA1C 5.9 (H) 10/29/2013   Lab Results  Component Value Date   INSULIN 17.9 12/08/2020   Lab Results  Component Value Date   TSH 0.908 12/08/2020   Lab Results  Component Value Date   CHOL 180 12/08/2020   HDL 49 12/08/2020   LDLCALC 118 (H) 12/08/2020   TRIG 70 12/08/2020   CHOLHDL 3.7 12/08/2020   Lab Results  Component Value Date   WBC 5.0 12/08/2020   HGB 12.8 12/08/2020   HCT 40.2 12/08/2020   MCV 83 12/08/2020   PLT 411 12/08/2020   Lab Results  Component Value Date   IRON 38 (L) 03/20/2020   FERRITIN 27 08/20/2019   Attestation Statements:   Reviewed by clinician on day of visit: allergies, medications, problem list, medical history, surgical history, family history, social history, and previous encounter notes.  Time spent on visit including pre-visit chart review and post-visit care and charting was 45 minutes.   I, Network engineer, CMA, am acting as Location manager for [employer], DO.  I have reviewed the above documentation for accuracy and completeness, and I agree with the above. Marjory Sneddon, D.O.  The [place] was signed into law in 2016 which includes the topic of electronic health records.  This provides immediate access to information in MyChart.  This includes consultation notes, operative notes, office notes, lab results and pathology reports.  If you have any questions about what you read please let us know at your next visit so we can discuss your concerns and take corrective action if need be.  We are right here with you.

## 2021-01-01 ENCOUNTER — Encounter: Payer: 59 | Admitting: Gastroenterology

## 2021-01-05 ENCOUNTER — Other Ambulatory Visit: Payer: Self-pay

## 2021-01-05 ENCOUNTER — Encounter (INDEPENDENT_AMBULATORY_CARE_PROVIDER_SITE_OTHER): Payer: Self-pay | Admitting: Family Medicine

## 2021-01-05 ENCOUNTER — Encounter: Payer: Self-pay | Admitting: Podiatry

## 2021-01-05 ENCOUNTER — Ambulatory Visit (INDEPENDENT_AMBULATORY_CARE_PROVIDER_SITE_OTHER): Payer: 59 | Admitting: Family Medicine

## 2021-01-05 ENCOUNTER — Ambulatory Visit: Payer: 59 | Admitting: Podiatry

## 2021-01-05 VITALS — BP 139/92 | HR 64 | Temp 98.3°F | Ht 63.0 in | Wt 233.0 lb

## 2021-01-05 DIAGNOSIS — Z6841 Body Mass Index (BMI) 40.0 and over, adult: Secondary | ICD-10-CM

## 2021-01-05 DIAGNOSIS — M79671 Pain in right foot: Secondary | ICD-10-CM

## 2021-01-05 DIAGNOSIS — M216X1 Other acquired deformities of right foot: Secondary | ICD-10-CM

## 2021-01-05 DIAGNOSIS — M79672 Pain in left foot: Secondary | ICD-10-CM

## 2021-01-05 DIAGNOSIS — R7303 Prediabetes: Secondary | ICD-10-CM

## 2021-01-05 DIAGNOSIS — Z9189 Other specified personal risk factors, not elsewhere classified: Secondary | ICD-10-CM

## 2021-01-05 DIAGNOSIS — E66813 Obesity, class 3: Secondary | ICD-10-CM

## 2021-01-05 DIAGNOSIS — M62462 Contracture of muscle, left lower leg: Secondary | ICD-10-CM

## 2021-01-05 DIAGNOSIS — E559 Vitamin D deficiency, unspecified: Secondary | ICD-10-CM

## 2021-01-05 DIAGNOSIS — M216X2 Other acquired deformities of left foot: Secondary | ICD-10-CM | POA: Diagnosis not present

## 2021-01-05 DIAGNOSIS — M21862 Other specified acquired deformities of left lower leg: Secondary | ICD-10-CM

## 2021-01-05 DIAGNOSIS — M722 Plantar fascial fibromatosis: Secondary | ICD-10-CM | POA: Diagnosis not present

## 2021-01-05 DIAGNOSIS — M21861 Other specified acquired deformities of right lower leg: Secondary | ICD-10-CM

## 2021-01-05 DIAGNOSIS — M62461 Contracture of muscle, right lower leg: Secondary | ICD-10-CM

## 2021-01-05 MED ORDER — ERGOCALCIFEROL 1.25 MG (50000 UT) PO CAPS: ORAL_CAPSULE | ORAL | 0 refills | Status: DC

## 2021-01-05 NOTE — Progress Notes (Signed)
  Subjective:  Patient ID: Janet [name], female    DOB: 01-08-1986,  MRN: [id number]  Chief Complaint  Patient presents with  . Plantar Fasciitis    PT stated that the pain is about the same and she has tried different shoes but nothing has helped.    35 y.o. female returns for follow-up with the above complaint. History confirmed with patient.  Still feels about the same.  This is now going on for over 6 months  Objective:  Physical Exam: warm, good capillary refill, no trophic changes or ulcerative lesions, normal DP and PT pulses and normal sensory exam. Bilateral sharp heel pain at the plantar insertion of the plantar fascia on the calcaneus.  Gastrocnemius equinus present bilaterally   Radiographs: X-ray of both feet: no fracture, dislocation, swelling or degenerative changes noted, hallux valgus deformity and pes planus Assessment:   1. Plantar fasciitis of left foot   2. Plantar fasciitis of right foot   3. Gastrocnemius equinus of left lower extremity   4. Gastrocnemius equinus of right lower extremity      Plan:  Patient was evaluated and treated and all questions answered.  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  We discussed further treatment options.  I believe that we would be best served by beginning with MRI evaluation of both plantar fascia to evaluate for chronic microtearing, bony edema at the attachment and thickening of the fascia.  Supples guide further treatment options which we discussed today including noninvasive treatment with EPAT shockwave therapy, minimally invasive treatment with Topaz procedure and complete fasciotomy with open surgery.  We discussed the risks benefits and potential complications of each.  I like to see her back after the MRI to determine our next treatment plan.  No follow-ups on file.

## 2021-01-12 ENCOUNTER — Other Ambulatory Visit (INDEPENDENT_AMBULATORY_CARE_PROVIDER_SITE_OTHER): Payer: Self-pay | Admitting: Family Medicine

## 2021-01-12 DIAGNOSIS — R7303 Prediabetes: Secondary | ICD-10-CM

## 2021-01-13 ENCOUNTER — Other Ambulatory Visit: Payer: Self-pay

## 2021-01-13 ENCOUNTER — Telehealth: Payer: 59 | Admitting: Internal Medicine

## 2021-01-13 ENCOUNTER — Encounter: Payer: Self-pay | Admitting: Internal Medicine

## 2021-01-13 VITALS — Ht 63.0 in | Wt 230.0 lb

## 2021-01-13 DIAGNOSIS — Z20828 Contact with and (suspected) exposure to other viral communicable diseases: Secondary | ICD-10-CM | POA: Diagnosis not present

## 2021-01-13 MED ORDER — OSELTAMIVIR PHOSPHATE 75 MG PO CAPS: 75.0000 mg | ORAL_CAPSULE | Freq: Every day | ORAL | 0 refills | Status: DC

## 2021-01-13 NOTE — Progress Notes (Signed)
Virtual Visit via Telephone Note   This visit type was conducted due to national recommendations for restrictions regarding the COVID-19 Pandemic (e.g. social distancing) in an effort to limit this patient's exposure and mitigate transmission in our community.  Due to her co-morbid illnesses, this patient is at least at moderate risk for complications without adequate follow up.  This format is felt to be most appropriate for this patient at this time.  The patient did not have access to video technology/had technical difficulties with video requiring transitioning to audio format only (telephone).  All issues noted in this document were discussed and addressed.  No physical exam could be performed with this format.  Evaluation Performed:  Follow-up visit  Date:  01/13/2021   ID:  Janet [name], DOB 1986-09-04, MRN [id number]  Patient Location: Home Provider Location: Office/Clinic  Participants: Patient Location of Patient: Home Location of Provider: Telehealth Consent was obtain for visit to be over via telehealth. I verified that I am speaking with the correct person using two identifiers.  PCP:  [name], MD   Chief Complaint:  Flu exposure  History of Present Illness:    Janet [name] is a 35 y.o. female who has a televisit for c/o flu exposure. Her daughter tested positive for flu today. Patient currently has mild throat irritation, but denies any fever, chills, runny nose, nasal congestion, sinus pain/pressure.  The patient does not have symptoms concerning for COVID-19 infection (fever, chills, cough, or new shortness of breath).   Past Medical, Surgical, Social History, Allergies, and Medications have been Reviewed.  Past Medical History:  Diagnosis Date  . Adrenal tumor   . Anemia    Phreesia 08/12/2020  . Back pain   . 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 Carpal tunnel syndrome during pregnancy   . Chest pain   . 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 Constipation   . 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 Cushing's syndrome ([place]) 11/27/2013  . Gallbladder problem   . Gastroesophageal reflux disease   . GERD (gastroesophageal reflux disease)    Phreesia 08/12/2020  . Heart murmur    Phreesia 08/12/2020  . Hematuria 09/18/2015  . Hemorrhoids   . History of Chiari malformation   . Hypertension    Phreesia 08/12/2020  . Internal hemorrhoids with other complication 9/38/1829   JAN 2015 R ANT MAR 2015 R POS AND L LAT BAND   . Menstrual periods irregular 09/18/2015  . Migraine without aura, with intractable migraine, so stated, without mention of status migrainosus 11/11/2013  . Palpitations   . Patient desires pregnancy 11/27/2013  . Plantar fasciitis   . Prediabetes   . Preeclampsia 05/21/2019   2x/wk testing nst alt w/ bpp/dopp      Deliver @ 37wks        IOL scheduled for 8/19 @ 0830  . Pregnant 12/16/2015  . SOBOE (shortness of breath on exertion)   . Swelling of both lower extremities   . Vitamin D deficiency    Past Surgical History:  Procedure Laterality Date  . CHOLECYSTECTOMY  02/2015  . COLONOSCOPY N/A 04/19/2013   HBZ:JIRCVE mucosa in the terminal ileum/Single erosion in transverse colon/RECTAL BLEEDING DUE TO Moderate sized internal hemorrhoids/ trv colon erosion, bx benign.  . COLONOSCOPY WITH PROPOFOL N/A 08/31/2020   Non-bleeding internal hemorrhoids. Colon torturous.   Marland Kitchen  ESOPHAGOGASTRODUODENOSCOPY N/A 04/19/2013   ZWC:HENI Non-erosive gastritis/PERI-UMBILICAL PAIN DUE TO GERD, GASTRITIS, AND CONSTIPATION. Bx with mild chronic inactive gastritis. No H.Pylori  . HEMORRHOID BANDING  2015   Dr. Gala Romney- in office banding procedure  . LAPAROSCOPIC ADRENALECTOMY  10/28/2010  . TONSILLECTOMY    . tumor removed left adrenal gland 01/12       Current Meds  Medication Sig  . butalbital-acetaminophen-caffeine (FIORICET) 50-325-40 MG tablet  Take one tablet up to two times daily , as needed, for severe headache  . ergocalciferol (VITAMIN D2) 1.25 MG (50000 UT) capsule 1 po q wed, and 1 po q sun  . gabapentin (NEURONTIN) 100 MG capsule Take one capsule by mouth in the morning and two capsules in at bedtime for pain (Patient taking differently: as needed. Take one capsule by mouth in the morning and two capsules in at bedtime for pain)  . ibuprofen (ADVIL) 800 MG tablet Take 1 tablet (800 mg total) by mouth 3 (three) times daily. (Patient taking differently: Take 800 mg by mouth as needed.)  . meloxicam (MOBIC) 15 MG tablet Take 1 tablet (15 mg total) by mouth daily. (Patient taking differently: Take 15 mg by mouth as needed.)  . metFORMIN (GLUCOPHAGE) 500 MG tablet 1/2 po qd for 4 d, then 1 po qd in am with food  . Multiple Vitamin (MULTIVITAMIN) tablet Take 1 tablet by mouth daily.  . pantoprazole (PROTONIX) 40 MG tablet Take one tablet by mouth once daily, as needed, for heartburn (Patient taking differently: Take 40 mg by mouth daily.)  . topiramate (TOPAMAX) 25 MG tablet Take 75 mg by mouth at bedtime.  Marland Kitchen oseltamivir (TAMIFLU) 75 MG capsule Take 1 capsule (75 mg total) by mouth daily.     Allergies:   Methylprednisolone, Penicillins, Latex, and Neomycin   ROS:   Please see the history of present illness.     All other systems reviewed and are negative.   Labs/Other Tests and Data Reviewed:    Recent Labs: 12/08/2020: ALT 8; BUN 9; Creatinine, Ser 0.77; Hemoglobin 12.8; Platelets 411; Potassium 4.2; Sodium 140; TSH 0.908   Recent Lipid Panel Lab Results  Component Value Date/Time   CHOL 180 12/08/2020 11:51 AM   TRIG 70 12/08/2020 11:51 AM   HDL 49 12/08/2020 11:51 AM   CHOLHDL 3.7 12/08/2020 11:51 AM   CHOLHDL 3.1 03/20/2020 07:03 AM   LDLCALC 118 (H) 12/08/2020 11:51 AM   LDLCALC 99 03/20/2020 07:03 AM    Wt Readings from Last 3 Encounters:  01/13/21 230 lb (104.3 kg)  01/05/21 233 lb (105.7 kg)  12/22/20 230 lb  (104.3 kg)      ASSESSMENT & PLAN:    Flu exposure Daughter tested positive Patient not overall symptomatic yet Has had Influenza vaccine Tamiflu PPX  Time:   Today, I have spent 9 minutes reviewing the chart, including problem list, medications, and with the patient with telehealth technology discussing the above problems.   Medication Adjustments/Labs and Tests Ordered: Current medicines are reviewed at length with the patient today.  Concerns regarding medicines are outlined above.   Tests Ordered: No orders of the defined types were placed in this encounter.   Medication Changes: Meds ordered this encounter  Medications  . oseltamivir (TAMIFLU) 75 MG capsule    Sig: Take 1 capsule (75 mg total) by mouth daily.    Dispense:  5 capsule    Refill:  0     Note: This dictation was prepared with Dragon dictation  along with smaller phrase technology. Similar sounding words can be transcribed inadequately or may not be corrected upon review. Any transcriptional errors that result from this process are unintentional.      Disposition:  Follow up  Signed, Lindell Spar, MD  01/13/2021 2:32 PM     [employer]

## 2021-01-13 NOTE — Patient Instructions (Signed)
Influenza, Adult Influenza is also called "the flu." It is an infection in the lungs, nose, and throat (respiratory tract). It spreads easily from person to person (is contagious). The flu causes symptoms that are like a cold, along with high fever and body aches. What are the causes? This condition is caused by the influenza virus. You can get the virus by:  Breathing in droplets that are in the air after a person infected with the flu coughed or sneezed.  Touching something that has the virus on it and then touching your mouth, nose, or eyes. What increases the risk? Certain things may make you more likely to get the flu. These include:  Not washing your hands often.  Having close contact with many people during cold and flu season.  Touching your mouth, eyes, or nose without first washing your hands.  Not getting a flu shot every year. You may have a higher risk for the flu, and serious problems, such as a lung infection (pneumonia), if you:  Are older than 65.  Are pregnant.  Have a weakened disease-fighting system (immune system) because of a disease or because you are taking certain medicines.  Have a long-term (chronic) condition, such as: ? Heart, kidney, or lung disease. ? Diabetes. ? Asthma.  Have a liver disorder.  Are very overweight (morbidly obese).  Have anemia. What are the signs or symptoms? Symptoms usually begin suddenly and last 4-14 days. They may include:  Fever and chills.  Headaches, body aches, or muscle aches.  Sore throat.  Cough.  Runny or stuffy (congested) nose.  Feeling discomfort in your chest.  Not wanting to eat as much as normal.  Feeling weak or tired.  Feeling dizzy.  Feeling sick to your stomach or throwing up. How is this treated? If the flu is found early, you can be treated with antiviral medicine. This can help to reduce how bad the illness is and how long it lasts. This may be given by mouth or through an IV  tube. Taking care of yourself at home can help your symptoms get better. Your doctor may want you to:  Take over-the-counter medicines.  Drink plenty of fluids. The flu often goes away on its own. If you have very bad symptoms or other problems, you may be treated in a hospital. Follow these instructions at home: Activity  Rest as needed. Get plenty of sleep.  Stay home from work or school as told by your doctor. ? Do not leave home until you do not have a fever for 24 hours without taking medicine. ? Leave home only to go to your doctor. Eating and drinking  Take an ORS (oral rehydration solution). This is a drink that is sold at pharmacies and stores.  Drink enough fluid to keep your pee pale yellow.  Drink clear fluids in small amounts as you are able. Clear fluids include: ? Water. ? Ice chips. ? Fruit juice mixed with water. ? Low-calorie sports drinks.  Eat bland foods that are easy to digest. Eat small amounts as you are able. These foods include: ? Bananas. ? Applesauce. ? Rice. ? Lean meats. ? Toast. ? Crackers.  Do not eat or drink: ? Fluids that have a lot of sugar or caffeine. ? Alcohol. ? Spicy or fatty foods. General instructions  Take over-the-counter and prescription medicines only as told by your doctor.  Use a cool mist humidifier to add moisture to the air in your home. This can make it   easier for you to breathe. ? When using a cool mist humidifier, clean it daily. Empty water and replace with clean water.  Cover your mouth and nose when you cough or sneeze.  Wash your hands with soap and water often and for at least 20 seconds. This is also important after you cough or sneeze. If you cannot use soap and water, use alcohol-based hand sanitizer.  Keep all follow-up visits.      How is this prevented?  Get a flu shot every year. You may get the flu shot in late summer, fall, or winter. Ask your doctor when you should get your flu shot.  Avoid  contact with people who are sick during fall and winter. This is cold and flu season.   Contact a doctor if:  You get new symptoms.  You have: ? Chest pain. ? Watery poop (diarrhea). ? A fever.  Your cough gets worse.  You start to have more mucus.  You feel sick to your stomach.  You throw up. Get help right away if you:  Have shortness of breath.  Have trouble breathing.  Have skin or nails that turn a bluish color.  Have very bad pain or stiffness in your neck.  Get a sudden headache.  Get sudden pain in your face or ear.  Cannot eat or drink without throwing up. These symptoms may represent a serious problem that is an emergency. Get medical help right away. Call your local emergency services (911 in the [place]  Do not wait to see if the symptoms will go away.  Do not drive yourself to the hospital. Summary  Influenza is also called "the flu." It is an infection in the lungs, nose, and throat. It spreads easily from person to person.  Take over-the-counter and prescription medicines only as told by your doctor.  Getting a flu shot every year is the best way to not get the flu. This information is not intended to replace advice given to you by your health care provider. Make sure you discuss any questions you have with your health care provider. Document Revised: 05/15/2020 Document Reviewed: 05/15/2020 Elsevier Patient Education  2021 Elsevier Inc.  

## 2021-01-14 NOTE — Progress Notes (Signed)
Chief Complaint:   OBESITY Janet [name] is here to discuss her progress with her obesity treatment plan along with follow-up of her obesity related diagnoses.   Today's visit was #: 3 Starting weight: 231 lbs Starting date: 12/08/2020 Today's weight: 233 lbs Today's date: 01/05/2021 Total lbs lost to date: 0 Body mass index is 41.27 kg/m.   Interim History:  Janet [name] has gained 3 pounds since her last visit.  This morning, no breakfast, and vegetable soup for lunch (carrots, peas, potatoes, and [place]).  Most of the time she skips dinner.  No snacking.  Went to a wedding within the last 2 weeks.    Plan:  Low carb did not work for her.  Change to journaling plan due to her sensitivities and allergies to aspartame, etc.  Current Meal Plan: following a lower carbohydrate, vegetable and lean protein rich diet plan for 100% of the time.  Current Exercise Plan: Walking for 30-60 minutes 3-5 times per week.  Assessment/Plan:   No orders of the defined types were placed in this encounter.   Medications Discontinued During This Encounter  Medication Reason  . ergocalciferol (VITAMIN D2) 1.25 MG (50000 UT) capsule Reorder     Meds ordered this encounter  Medications  . ergocalciferol (VITAMIN D2) 1.25 MG (50000 UT) capsule    Sig: 1 po q wed, and 1 po q sun    Dispense:  8 capsule    Refill:  0    Ov for refills     1. Prediabetes Not at goal. Goal is HgbA1c < 5.7.  Medication: metformin 500 mg daily.  She has not started taking metformin yet.    Plan:  She will start metformin in the near future.  She has a follow-up appointment with Endo to discuss how hoer Cushing's may be interfering with her ability to lose weight.  She will continue to focus on protein-rich, low simple carbohydrate foods. We reviewed the importance of hydration, regular exercise for stress reduction, and restorative sleep.   Lab Results  Component Value Date   HGBA1C 6.2 (H) 12/08/2020   Lab Results   Component Value Date   INSULIN 17.9 12/08/2020   2. Vitamin D deficiency Not at goal. Current vitamin D is 17.5, tested on 12/08/2020. Optimal goal > 50 ng/dL.  She is taking vitamin D 50,000 IU twice weekly.  She notices less achiness in her bones after taking vitamin D.  Increased energy levels.  Plan: Continue to take prescription Vitamin D @50 ,000 IU twice weekly as prescribed.  Follow-up for routine testing of Vitamin D, at least 2-3 times per year to avoid over-replacement.  - Refill ergocalciferol (VITAMIN D2) 1.25 MG (50000 UT) capsule; 1 po q wed, and 1 po q sun  Dispense: 8 capsule; Refill: 0  3. At risk for impaired metabolic function Due to Marion's current state of health and medical condition(s), she is at a significantly higher risk for impaired metabolic function.   At least 9 minutes was spent on counseling [name] about these concerns today.  This places the patient at a much greater risk to subsequently develop cardio-pulmonary conditions that can negatively affect the patient's quality of life.  I stressed the importance of reversing these risks factors.  The initial goal is to lose at least 5-10% of starting weight to help reduce risk factors.  Counseling:  Intensive lifestyle modifications discussed with Janet [name] as the most appropriate first line treatment.  she will continue to work on diet, exercise,  and weight loss efforts.  We will continue to reassess these conditions on a fairly regular basis in an attempt to decrease the patient's overall morbidity and mortality.  4. Class 3 severe obesity with serious comorbidity and body mass index (BMI) of 40.0 to 44.9 in adult, unspecified obesity type ([place])  Course: Janet [name] is currently in the action stage of change. As such, her goal is to continue with weight loss efforts.   Nutrition goals: She has agreed to keeping a food journal and adhering to recommended goals of 1600-1700 calories and 110+ grams of protein.   Exercise  goals: As is.  Behavioral modification strategies: decreasing simple carbohydrates, no skipping meals, meal planning and cooking strategies and planning for success.  Krystena has agreed to follow-up with our clinic in 2-3 weeks. She was informed of the importance of frequent follow-up visits to maximize her success with intensive lifestyle modifications for her multiple health conditions.   Objective:   Blood pressure (!) 139/92, pulse 64, temperature 98.3 F (36.8 C), height 5\' 3"  (1.6 m), weight 233 lb (105.7 kg), SpO2 99 %, not currently breastfeeding. Body mass index is 41.27 kg/m.  General: Cooperative, alert, well developed, in no acute distress. HEENT: Conjunctivae and lids unremarkable. Cardiovascular: Regular rhythm.  Lungs: Normal work of breathing. Neurologic: No focal deficits.   Lab Results  Component Value Date   CREATININE 0.77 12/08/2020   BUN 9 12/08/2020   NA 140 12/08/2020   K 4.2 12/08/2020   CL 103 12/08/2020   CO2 21 12/08/2020   Lab Results  Component Value Date   ALT 8 12/08/2020   AST 18 12/08/2020   ALKPHOS 80 12/08/2020   BILITOT 0.4 12/08/2020   Lab Results  Component Value Date   HGBA1C 6.2 (H) 12/08/2020   HGBA1C 5.7 (H) 07/10/2018   HGBA1C 5.6 02/07/2017   HGBA1C 5.7 (H) 01/18/2016   HGBA1C 5.9 (H) 10/29/2013   Lab Results  Component Value Date   INSULIN 17.9 12/08/2020   Lab Results  Component Value Date   TSH 0.908 12/08/2020   Lab Results  Component Value Date   CHOL 180 12/08/2020   HDL 49 12/08/2020   LDLCALC 118 (H) 12/08/2020   TRIG 70 12/08/2020   CHOLHDL 3.7 12/08/2020   Lab Results  Component Value Date   WBC 5.0 12/08/2020   HGB 12.8 12/08/2020   HCT 40.2 12/08/2020   MCV 83 12/08/2020   PLT 411 12/08/2020   Lab Results  Component Value Date   IRON 38 (L) 03/20/2020   FERRITIN 27 08/20/2019   Attestation Statements:   Reviewed by clinician on day of visit: allergies, medications, problem list, medical  history, surgical history, family history, social history, and previous encounter notes.  I, Water quality scientist, CMA, am acting as Location manager for [employer], DO.  I have reviewed the above documentation for accuracy and completeness, and I agree with the above. Marjory Sneddon, D.O.  The [place] was signed into law in 2016 which includes the topic of electronic health records.  This provides immediate access to information in MyChart.  This includes consultation notes, operative notes, office notes, lab results and pathology reports.  If you have any questions about what you read please let us know at your next visit so we can discuss your concerns and take corrective action if need be.  We are right here with you.

## 2021-01-15 ENCOUNTER — Encounter (INDEPENDENT_AMBULATORY_CARE_PROVIDER_SITE_OTHER): Payer: Self-pay | Admitting: Family Medicine

## 2021-01-17 ENCOUNTER — Other Ambulatory Visit: Payer: Self-pay

## 2021-01-17 ENCOUNTER — Ambulatory Visit
Admission: RE | Admit: 2021-01-17 | Discharge: 2021-01-17 | Disposition: A | Discharge status: Home / Self Care | Payer: 59 | Source: Ambulatory Visit | Attending: Podiatry | Admitting: Podiatry

## 2021-01-17 DIAGNOSIS — M722 Plantar fascial fibromatosis: Secondary | ICD-10-CM

## 2021-01-17 DIAGNOSIS — M79672 Pain in left foot: Secondary | ICD-10-CM

## 2021-01-17 DIAGNOSIS — M79671 Pain in right foot: Secondary | ICD-10-CM

## 2021-01-19 ENCOUNTER — Ambulatory Visit (INDEPENDENT_AMBULATORY_CARE_PROVIDER_SITE_OTHER): Payer: 59 | Admitting: Family Medicine

## 2021-01-26 ENCOUNTER — Other Ambulatory Visit: Payer: Self-pay

## 2021-01-26 ENCOUNTER — Ambulatory Visit (INDEPENDENT_AMBULATORY_CARE_PROVIDER_SITE_OTHER): Payer: 59

## 2021-01-26 ENCOUNTER — Ambulatory Visit: Payer: 59 | Admitting: Podiatry

## 2021-01-26 ENCOUNTER — Other Ambulatory Visit: Payer: Self-pay | Admitting: Podiatry

## 2021-01-26 DIAGNOSIS — M958 Other specified acquired deformities of musculoskeletal system: Secondary | ICD-10-CM

## 2021-01-26 DIAGNOSIS — M722 Plantar fascial fibromatosis: Secondary | ICD-10-CM | POA: Diagnosis not present

## 2021-01-28 NOTE — Progress Notes (Signed)
Subjective:  Patient ID: Janet [name], female    DOB: [birth year],  MRN: [id number]  Chief Complaint  Patient presents with  . Foot Pain    35 y.o. female returns for follow-up with the above complaint. History confirmed with patient.  She completed the MRI.  She has a new issue with worsening pain in the right ankle  Objective:  Physical Exam: warm, good capillary refill, no trophic changes or ulcerative lesions, normal DP and PT pulses and normal sensory exam. Bilateral sharp heel pain at the plantar insertion of the plantar fascia on the calcaneus.  Gastrocnemius equinus present bilaterally.  On the right ankle she has sharp pain with range of motion and palpation of the anteromedial gutter   Radiographs: X-ray of both feet: no fracture, dislocation, swelling or degenerative changes noted, hallux valgus deformity and pes planus  Study Result  Narrative & Impression  CLINICAL DATA:  Chronic right heel pain  EXAM: MR OF THE RIGHT HEEL WITHOUT CONTRAST  TECHNIQUE: Multiplanar, multisequence MR imaging of the right heel was performed. No intravenous contrast was administered.  COMPARISON:  X-ray 06/11/2020  FINDINGS: TENDONS  Peroneal: Intact peroneus longus and peroneus brevis tendons.  Posteromedial: Intact tibialis posterior, flexor hallucis longus and flexor digitorum longus tendons.  Anterior: Intact tibialis anterior, extensor hallucis longus and extensor digitorum longus tendons.  Achilles: Intact.  Plantar Fascia: Intact plantar fascia with perifascial edema associated with the central band proximally.  LIGAMENTS  Lateral: The anterior and posterior tibiofibular ligaments are intact. The anterior and posterior talofibular ligaments are intact. Intact calcaneofibular ligament.  Medial: Deltoid ligament and spring ligament complex intact.  CARTILAGE  Ankle Joint: Large osteochondral defect at the medial talar shoulder measuring 12 x 7  x 7 mm with partially undercutting fluid signal and mild adjacent bone marrow edema. No displaced fragment. No tibiotalar joint effusion.  Subtalar Joints/Sinus Tarsi: No joint effusion or chondral defect. Preservation of the anatomic fat within the sinus tarsi.  Bones: Mild pes planovalgus alignment. No fracture or dislocation. Talar dome OCL, as above.  Other: Nonspecific subcutaneous edema at the medial aspect of the ankle. No organized fluid collection.  IMPRESSION: Right ankle/hindfoot:  1. Large osteochondral defect at the medial talar shoulder measuring 12 x 7 x 7 mm with partially undercutting fluid signal and mild adjacent bone marrow edema. No displaced fragment. 2. Mild plantar fasciitis. 3. Mild pes planovalgus alignment.   Electronically Signed   By: Davina Poke D.O.   On: 01/18/2021 13:19   Study Result  Narrative & Impression  CLINICAL DATA:  Chronic left foot pain.  No known injury.  EXAM: MR OF THE LEFT HEEL WITHOUT CONTRAST  TECHNIQUE: Multiplanar, multisequence MR imaging of the ankle was performed. No intravenous contrast was administered.  COMPARISON:  Is plain films left foot 06/11/2020.  FINDINGS: TENDONS  Peroneal: Intact.  Posteromedial: Intact.  Anterior: Intact.  Achilles: Intact.  Plantar Fascia: Intact. There is intrasubstance increased T2 signal and mild thickening in the proximal aspect of the medial cord of the plantar fascia consistent with plantar fasciitis.  LIGAMENTS  Lateral: Intact.  Medial: Intact.  CARTILAGE  Ankle Joint: Normal. No joint effusion or osteochondral lesion of the talar dome. There  Subtalar Joints/Sinus Tarsi: Normal.  Bones: A small focus of mild reactive marrow edema is seen in the calcaneus at the plantar fascia attachment. Marrow signal is otherwise normal throughout.  Other: None.  IMPRESSION: The examination is positive for moderate appearing plantar  fasciitis of the  medial cord without rupture. No other abnormality is identified.   Electronically Signed   By: Inge Rise M.D.   On: 01/18/2021 13:33     Assessment:   1. Osteochondral defect of talus   2. Plantar fasciitis of right foot   3. Plantar fasciitis of left foot      Plan:  Patient was evaluated and treated and all questions answered.  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  Lesion measures 11 mm x 7 mm and is located in the medial central shoulder.  Appears to have a stable rim.  I suspect she likely had an old injury that had cystic formation in the subchondral bone plate and cartilage has now collapsed.  At her age she would do best with surgical intervention which I recommended.  Hopefully will be able to bone graft with autograft from her right heel and cover the lesion with cartilage allografting.  We will plan for this with an arthroscopic diagnostic approach and for debridement, hopeful to perform all arthroscopic but unclear if this will be possible and may have to perform medial malleolar osteotomy or anterior arthrotomy to access the lesion.  We also discussed simultaneously we will perform a Topaz procedure on the bilateral plantar fashion to see if this improves her plantar fasciitis.  She likely will need an EPF at some point down the road but I would prefer to her to not have any incisions for this currently because she will need to be nonweightbearing on the right lower extremity for approximately 6 weeks.  Plan to begin range of motion and physical therapy at 4 weeks postop.  We discussed the risk, benefits and potential complications of this.  We also discussed the likelihood of ankle arthritis developing at some point in her lifetime and the need for further procedures to address this, hopefully the planned procedure will prevent this from happening or least delay it.  All questions were addressed.  Informed  consent was signed and reviewed.   Surgical plan:  Procedure: -Right ankle arthroscopy with debridement, arthroscopic versus open osteochondral lesion repair with bone autograft from ipsilateral calcaneus and cartilage matrix allografting, bilateral Topaz plantar fascia  Location: -[facility]  Anesthesia plan: -General anesthesia with regional block  Postoperative pain plan: - Tylenol 1000 mg every 6 hours, ibuprofen 600 mg every 6 hours, gabapentin 300 mg every 8 hours x5 days, oxycodone 5 mg 1-2 tabs every 6 hours only as needed  DVT prophylaxis: -We will plan for Xarelto use  WB Restrictions / DME needs: -NWB in compression cast will transition to boot postop  No follow-ups on file.

## 2021-02-02 ENCOUNTER — Ambulatory Visit: Payer: 59 | Admitting: Podiatry

## 2021-02-15 ENCOUNTER — Telehealth: Payer: Self-pay

## 2021-02-15 ENCOUNTER — Other Ambulatory Visit: Payer: Self-pay | Admitting: Podiatry

## 2021-02-15 DIAGNOSIS — R2689 Other abnormalities of gait and mobility: Secondary | ICD-10-CM

## 2021-02-15 NOTE — Telephone Encounter (Signed)
Placed knee scooter order with [facility]

## 2021-02-16 ENCOUNTER — Ambulatory Visit: Payer: 59 | Admitting: Gastroenterology

## 2021-02-19 ENCOUNTER — Telehealth: Payer: Self-pay | Admitting: Urology

## 2021-02-19 NOTE — Telephone Encounter (Signed)
DOS - 03/12/21  FASCIECTOMY B/L --- 91694  OSTEOTOMY RIGHT --- 50388 EXCISION BONE RIGHT --- 82800 ARTHROSCOPY RIGHT  --- 34917   [employer] EFFECTIVE DATE --- 10/10/2018   PLAN DEDUCTIBLE - $300 W/ $0 REMAINING OUT OF POCKET - $2,000 W/ $1,109.22 REMAINING COINSURANCE - 90% COPAY - $0.00   PER [employer]'S AUTOMATIVE SYSTEM FOR CPT CODES 91505 X'S 2, 27705, 69794 AND 80165 NO PRIOR AUTH IS REQUIRED.   REF # [id number]

## 2021-02-23 ENCOUNTER — Ambulatory Visit: Payer: 59 | Admitting: Endocrinology

## 2021-02-24 ENCOUNTER — Ambulatory Visit: Payer: 59 | Admitting: Gastroenterology

## 2021-03-03 ENCOUNTER — Encounter: Payer: 59 | Admitting: Gastroenterology

## 2021-03-10 HISTORY — PX: OTHER SURGICAL HISTORY: SHX169

## 2021-03-12 ENCOUNTER — Other Ambulatory Visit: Payer: Self-pay | Admitting: Podiatry

## 2021-03-12 DIAGNOSIS — M722 Plantar fascial fibromatosis: Secondary | ICD-10-CM

## 2021-03-12 DIAGNOSIS — M93271 Osteochondritis dissecans, right ankle and joints of right foot: Secondary | ICD-10-CM

## 2021-03-12 MED ORDER — RIVAROXABAN 10 MG PO TABS: 10.0000 mg | ORAL_TABLET | Freq: Every day | ORAL | 0 refills | Status: DC

## 2021-03-12 MED ORDER — GABAPENTIN 300 MG PO CAPS
300.0000 mg | ORAL_CAPSULE | Freq: Three times a day (TID) | ORAL | 0 refills | Status: DC
Start: 2021-03-12 — End: 2021-04-26

## 2021-03-12 MED ORDER — ACETAMINOPHEN 500 MG PO TABS: 1000.0000 mg | ORAL_TABLET | Freq: Four times a day (QID) | ORAL | 0 refills | Status: AC | PRN

## 2021-03-12 MED ORDER — OXYCODONE HCL 5 MG PO TABS: 5.0000 mg | ORAL_TABLET | ORAL | 0 refills | Status: AC | PRN

## 2021-03-12 NOTE — Progress Notes (Signed)
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

## 2021-03-18 ENCOUNTER — Other Ambulatory Visit: Payer: Self-pay

## 2021-03-18 ENCOUNTER — Ambulatory Visit (INDEPENDENT_AMBULATORY_CARE_PROVIDER_SITE_OTHER): Payer: 59

## 2021-03-18 ENCOUNTER — Ambulatory Visit: Payer: 59 | Admitting: Neurology

## 2021-03-18 ENCOUNTER — Ambulatory Visit: Payer: 59 | Admitting: Podiatry

## 2021-03-18 DIAGNOSIS — M216X1 Other acquired deformities of right foot: Secondary | ICD-10-CM | POA: Diagnosis not present

## 2021-03-18 DIAGNOSIS — M722 Plantar fascial fibromatosis: Secondary | ICD-10-CM

## 2021-03-18 DIAGNOSIS — M21861 Other specified acquired deformities of right lower leg: Secondary | ICD-10-CM

## 2021-03-18 DIAGNOSIS — M958 Other specified acquired deformities of musculoskeletal system: Secondary | ICD-10-CM

## 2021-03-19 ENCOUNTER — Telehealth: Payer: Self-pay | Admitting: *Deleted

## 2021-03-19 NOTE — Telephone Encounter (Signed)
Patient is calling because she is possible having an allergic reaction to the coban or bandage dressing changes done 1 day ago.   She may have a sensitivity to latex ,bandages or the coban is causing a lot of burning in her leg.

## 2021-03-21 ENCOUNTER — Encounter: Payer: Self-pay | Admitting: Podiatry

## 2021-03-21 NOTE — Progress Notes (Signed)
  Subjective:  Patient ID: Janet [name], female    DOB: 1986-04-05,  MRN: [id number]  Chief Complaint  Patient presents with   Routine Post Op     (xrays)POV #1 DOS 03/12/2021 TOPAZ PROC B/L PLANTAR FASCIA, RT ANKLE ARTHROSCOPY & TREATMENT OF CARTILAGE LESION W/BONE GRAFTING FROM HEEL & GRAFT FOR CARTILAge    DOS: 03/12/2021 Procedure: Bilateral plantar fascial Topaz, arthroscopic treatment of osteochondral defect of right ankle  35 y.o. female returns for post-op check.  Overall doing well her pain is improving  Review of Systems: Negative except as noted in the HPI. Denies N/V/F/Ch.   Objective:  There were no vitals filed for this visit. There is no height or weight on file to calculate BMI. Constitutional Well developed. Well nourished.  Vascular Foot warm and well perfused. Capillary refill normal to all digits.   Neurologic Normal speech. Oriented to person, place, and time. Epicritic sensation to light touch grossly present bilaterally.  Dermatologic Skin healing well without signs of infection. Skin edges well coapted without signs of infection.  Orthopedic: Tenderness to palpation noted about the surgical site.   Radiographs: 3 views of the right ankle taken lucency in the talar dome is no longer notable Assessment:   1. Osteochondral defect of talus   2. Plantar fasciitis of right foot   3. Plantar fasciitis of left foot    Plan:  Patient was evaluated and treated and all questions answered.  S/p foot surgery bilaterally -Progressing as expected post-operatively. -XR: As above -WB Status: NWB in CAM boot on right, WBAT in regular shoe gear on the left -Sutures: Leave intact. -Medications: No refills required -Foot redressed.  No dressing on left side can bathe regularly at this point.  Leave dressing intact on right side leave CAM boot on as well at all times  Return in about 2 weeks (around 04/01/2021) for suture removal.

## 2021-03-22 NOTE — Telephone Encounter (Signed)
Returned call to patient and explained instructions per Dr Posey Pronto, verbalized understanding.She said that the area is better as well.

## 2021-03-25 ENCOUNTER — Encounter: Payer: 59 | Admitting: Podiatry

## 2021-04-01 ENCOUNTER — Ambulatory Visit (INDEPENDENT_AMBULATORY_CARE_PROVIDER_SITE_OTHER): Payer: 59 | Admitting: Podiatry

## 2021-04-01 ENCOUNTER — Other Ambulatory Visit: Payer: Self-pay

## 2021-04-01 DIAGNOSIS — M722 Plantar fascial fibromatosis: Secondary | ICD-10-CM

## 2021-04-01 DIAGNOSIS — M958 Other specified acquired deformities of musculoskeletal system: Secondary | ICD-10-CM

## 2021-04-05 NOTE — Progress Notes (Signed)
  Subjective:  Patient ID: Janet [name], female    DOB: 1986-09-02,  MRN: [id number]  Chief Complaint  Patient presents with   Routine Post Op     POV #2 DOS 03/12/2021 TOPAZ PROC B/L PLANTAR FASCIA, RT ANKLE ARTHROSCOPY & TREATMENT OF CARTILAGE LESION W/BONE GRAFTING FROM HEEL & GRAFT FOR CARTILAGE    DOS: 03/12/2021 Procedure: Bilateral plantar fascial Topaz, arthroscopic treatment of osteochondral defect of right ankle  35 y.o. female returns for post-op check.  Overall doing well her pain is improving she did slip and put weight down on the foot but is feeling better since that happened  Review of Systems: Negative except as noted in the HPI. Denies N/V/F/Ch.   Objective:  There were no vitals filed for this visit. There is no height or weight on file to calculate BMI. Constitutional Well developed. Well nourished.  Vascular Foot warm and well perfused. Capillary refill normal to all digits.   Neurologic Normal speech. Oriented to person, place, and time. Epicritic sensation to light touch grossly present bilaterally.  Dermatologic Skin healing well without signs of infection. Skin edges well coapted without signs of infection.  Orthopedic: Tenderness to palpation noted about the surgical site.   Radiographs: 3 views of the right ankle taken lucency in the talar dome is no longer notable Assessment:   No diagnosis found.  Plan:  Patient was evaluated and treated and all questions answered.  S/p foot surgery bilaterally -Progressing as expected post-operatively. -XR: As above -WB Status: NWB in CAM boot on right, WBAT in regular shoe gear on the left.  Symmetric boot off to begin nonweightbearing range of motion exercises -Sutures: Removed today -No apparent complication from her slip and fall currently  No follow-ups on file.

## 2021-04-08 ENCOUNTER — Encounter: Payer: 59 | Admitting: Podiatry

## 2021-04-15 ENCOUNTER — Encounter: Payer: 59 | Admitting: Podiatry

## 2021-04-16 ENCOUNTER — Ambulatory Visit: Payer: 59 | Admitting: Endocrinology

## 2021-04-22 ENCOUNTER — Ambulatory Visit (INDEPENDENT_AMBULATORY_CARE_PROVIDER_SITE_OTHER): Payer: 59 | Admitting: Podiatry

## 2021-04-22 ENCOUNTER — Other Ambulatory Visit: Payer: Self-pay

## 2021-04-22 DIAGNOSIS — M958 Other specified acquired deformities of musculoskeletal system: Secondary | ICD-10-CM

## 2021-04-22 DIAGNOSIS — M722 Plantar fascial fibromatosis: Secondary | ICD-10-CM

## 2021-04-22 NOTE — Patient Instructions (Signed)
For the next 2 weeks begin putting weight gradually in the boot. Continue range of motion exercises  Week of August 1st, begin physical therapy. You may do weightbearing exercises with them if you can tolerate it. You can also walk in a supportive shoe with the ankle brace for light intensity and distance (such as around the house)  Week of August 15th, you should be able to walk more and more in the brace and shoe.     If anything causes significant pain and swelling (especially if it lasts for more than 15 minutes), avoid that activity until you are more comfortable

## 2021-04-26 ENCOUNTER — Telehealth: Payer: Self-pay | Admitting: Neurology

## 2021-04-26 ENCOUNTER — Ambulatory Visit (INDEPENDENT_AMBULATORY_CARE_PROVIDER_SITE_OTHER): Payer: 59 | Admitting: Neurology

## 2021-04-26 ENCOUNTER — Encounter: Payer: Self-pay | Admitting: Neurology

## 2021-04-26 VITALS — BP 138/101 | HR 84 | Ht 64.0 in

## 2021-04-26 DIAGNOSIS — G43009 Migraine without aura, not intractable, without status migrainosus: Secondary | ICD-10-CM | POA: Diagnosis not present

## 2021-04-26 DIAGNOSIS — Q0700 Arnold-Chiari syndrome without spina bifida or hydrocephalus: Secondary | ICD-10-CM

## 2021-04-26 MED ORDER — ALPRAZOLAM 0.5 MG PO TABS: ORAL_TABLET | ORAL | 0 refills | Status: DC

## 2021-04-26 MED ORDER — PROPRANOLOL HCL 20 MG PO TABS: 20.0000 mg | ORAL_TABLET | Freq: Two times a day (BID) | ORAL | 3 refills | Status: DC

## 2021-04-26 NOTE — Telephone Encounter (Signed)
Return in about 3 months (around 07/27/2021). Which doctor would you like this patient to follow-up with? You do not have any openings in October.

## 2021-04-26 NOTE — Progress Notes (Signed)
  Subjective:  Patient ID: Janet [name], female    DOB: 24-Jul-1986,  MRN: [id number]  Chief Complaint  Patient presents with   Routine Post Op    POV #3 DOS 03/12/2021 TOPAZ PROC B/L PLANTAR FASCIA, RT ANKLE ARTHROSCOPY & TREATMENT OF CARTILAGE LESION W/BONE GRAFTING FROM HEEL & GRAFT FOR CARTILAGE    DOS: 03/12/2021 Procedure: Bilateral plantar fascial Topaz, arthroscopic treatment of osteochondral defect of right ankle  35 y.o. female returns for post-op check.  Overall doing well   Review of Systems: Negative except as noted in the HPI. Denies N/V/F/Ch.   Objective:  There were no vitals filed for this visit. There is no height or weight on file to calculate BMI. Constitutional Well developed. Well nourished.  Vascular Foot warm and well perfused. Capillary refill normal to all digits.   Neurologic Normal speech. Oriented to person, place, and time. Epicritic sensation to light touch grossly present bilaterally.  Dermatologic Skin healing well without signs of infection. Skin edges well coapted without signs of infection.  Orthopedic: Tenderness to palpation noted about the surgical site.  Good range of motion, she has mild tenderness at the end range of motion   Radiographs: 3 views of the right ankle taken lucency in the talar dome is no longer notable Assessment:   1. Osteochondral defect of talus   2. Plantar fasciitis of left foot   3. Plantar fasciitis of right foot     Plan:  Patient was evaluated and treated and all questions answered.  S/p foot surgery bilaterally -She may begin gradual weightbearing on the CAM boot and continue nonweightbearing range of motion exercises for the next 2 weeks.  For the week of August 1 she will restart physical therapy and wear the Tri-Lock ankle brace which I dispensed today as she transitions out of that over the 2 following weeks.  Should be able to weight-bear in the ankle brace the week of August 15 and later.  If anything is  more painful she should hold off and slow down this transition.  We will follow-up with her after this  Return in about 5 weeks (around 05/27/2021).

## 2021-04-26 NOTE — Patient Instructions (Signed)
We will start propranolol for the headaches.  Inderal (propranolol) is a blood pressure medication that is commonly used for migraine headaches. This is a type of beta blocker. The most common side effects include low heart rate, dizziness, fatigue, and increased depression. This medication may worsen asthma. If you believe that you are having side effects on this medication, please contact our office.

## 2021-04-26 NOTE — Progress Notes (Signed)
Reason for visit: Headache, Arnold-Chiari malformation  Janet [name] is an 35 y.o. female  History of present illness:  Janet [name] is a 35 year old right-handed black female with a history of headaches.  She is having some form of headache on a daily basis.  She in the past has a history of Arnold-Chiari malformation, she has reported pressure sensations coming of the back of the head with coughing or sneezing or straining, she may see black spots in front of the eyes at times.  She also has what sounds like 2 migraine headaches with photophobia and phonophobia and some nausea.  These may occur 2 or 3 times a month, but she is having some form of headache on a daily basis.  Occasionally when she turns her head, she may get a headache.  Again this is brief in nature.  The patient recently had right ankle surgery, she is recovering from this, not fully weightbearing yet.  She comes back here for an evaluation.  She was placed on Topamax previously but could not tolerate the drug and went off the medication.  She may take Fioricet if needed for the headache.  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   r/t adrenal tumor; tumor removed, disease resolved   Cushing's syndrome ([place]) 11/27/2013   Gallbladder problem    Gastroesophageal reflux disease    GERD (gastroesophageal reflux disease)    Phreesia 08/12/2020   Heart murmur    Phreesia 08/12/2020   Hematuria 09/18/2015   Hemorrhoids    History of Chiari malformation    Hypertension    Phreesia 08/12/2020   Internal hemorrhoids with other complication 7/98/9211   JAN 2015 R ANT MAR 2015 R POS AND L LAT BAND    Menstrual periods irregular 09/18/2015   Migraine  without aura, with intractable migraine, so stated, without mention of status migrainosus 11/11/2013   Palpitations    Patient desires pregnancy 11/27/2013   Plantar fasciitis    Prediabetes    Preeclampsia 05/21/2019   2x/wk testing nst alt w/ bpp/dopp      Deliver @ 37wks        IOL scheduled for 8/19 @ 0830   Pregnant 12/16/2015   SOBOE (shortness of breath on exertion)    Swelling of both lower extremities    Vitamin D deficiency     Past Surgical History:  Procedure Laterality Date   arthroscopic treatment of osteochondral defect of right ankle  03/2021   Bilateral plantar fascial Topaz  03/2021   CHOLECYSTECTOMY  02/2015   COLONOSCOPY N/A 04/19/2013   HER:DEYCXK mucosa in the terminal ileum/Single erosion in transverse colon/RECTAL BLEEDING DUE TO Moderate sized internal hemorrhoids/ trv colon erosion, bx benign.   COLONOSCOPY WITH PROPOFOL N/A 08/31/2020   Non-bleeding internal hemorrhoids. Colon torturous.    ESOPHAGOGASTRODUODENOSCOPY N/A 04/19/2013   GYJ:EHUD Non-erosive gastritis/PERI-UMBILICAL PAIN DUE TO GERD, GASTRITIS, AND CONSTIPATION. Bx with mild chronic inactive gastritis. No H.Pylori   HEMORRHOID BANDING  2015   Dr. Gala Romney- in office banding procedure   LAPAROSCOPIC ADRENALECTOMY  10/28/2010   TONSILLECTOMY     tumor removed left  adrenal gland 01/12      Family History  Problem Relation Age of Onset   Hypertension Mother    Hyperlipidemia Mother    Colon polyps Mother    Hypertension Father    Colon polyps Father    Sleep apnea Father    Obesity Father    Hypertension Maternal Grandmother    Diabetes Maternal Grandmother    Deep vein thrombosis Maternal Grandmother    Diabetes Paternal Grandmother    COPD Paternal Grandfather    Heart disease Paternal Grandfather    Colon cancer Other        maternal great uncle   Stroke Maternal Aunt    Migraines Maternal Aunt    Deep vein thrombosis Sister    Other Daughter        pulmonary valve stenosis     Social history:  reports that she has never smoked. She has never used smokeless tobacco. She reports that she does not drink alcohol and does not use drugs.    Allergies  Allergen Reactions   Methylprednisolone Shortness Of Breath, Itching and Other (See Comments)    Reaction:  Bruising    Penicillins Anaphylaxis, Rash and Other (See Comments)    Has patient had a PCN reaction causing immediate rash, facial/tongue/throat swelling, SOB or lightheadedness with hypotension: No Has patient had a PCN reaction causing severe rash involving mucus membranes or skin necrosis: No Has patient had a PCN reaction that required hospitalization No Has patient had a PCN reaction occurring within the last 10 years: No If all of the above answers are "NO", then may proceed with Cephalosporin use.   Latex Rash   Neomycin Rash    Breaks out with neosporin    Medications:  Prior to Admission medications   Medication Sig Start Date End Date Taking? Authorizing Provider  ergocalciferol (VITAMIN D2) 1.25 MG (50000 UT) capsule 1 po q wed, and 1 po q sun 01/05/21  Yes Opalski, Deborah, DO  Multiple Vitamin (MULTIVITAMIN) tablet Take 1 tablet by mouth daily.   Yes [provider]  pantoprazole (PROTONIX) 40 MG tablet Take one tablet by mouth once daily, as needed, for heartburn Patient taking differently: Take 40 mg by mouth daily. 06/30/20  Yes [name], MD  butalbital-acetaminophen-caffeine (FIORICET) [id number] MG tablet Take one tablet up to two times daily , as needed, for severe headache Patient not taking: Reported on 04/26/2021 08/13/20   [name], MD    ROS:  Out of a complete 14 system review of symptoms, the patient complains only of the following symptoms, and all other reviewed systems are negative.  Headache Right foot discomfort  Blood pressure (!) 138/101, pulse 84, height 5\' 4"  (1.626 m), not currently breastfeeding.  Physical Exam  General: The patient  is alert and cooperative at the time of the examination.  The patient is moderately obese.  Skin: No significant peripheral edema is noted.  The right lower leg is in a brace.   Neurologic Exam  Mental status: The patient is alert and oriented x 3 at the time of the examination. The patient has apparent normal recent and remote memory, with an apparently normal attention span and concentration ability.   Cranial nerves: Facial symmetry is present. Speech is normal, no aphasia or dysarthria is noted. Extraocular movements are full. Visual fields are full.  Motor: The patient has good strength in all 4 extremities.  Sensory examination: Soft touch sensation is symmetric on the face, arms, and  legs.  Coordination: The patient has good finger-nose-finger and heel-to-shin bilaterally.  Gait and station: The patient was not ambulated, she is not fully weightbearing on the right leg.  Reflexes: Deep tendon reflexes are symmetric.   Assessment/Plan:  1.  Migraine headache  2.  Arnold-Chiari malformation  The patient likely has both true migraine as well as headaches that may be related to the Arnold-Chiari malformation.  MRI of the brain will be done to compare to a prior study done 5 years ago looking for progression of the Arnold-Chiari.  The patient will be placed on propranolol 20 mg twice daily, she will call for any dose adjustments.  She will follow-up here in 3 months.  Jill Alexanders MD 04/26/2021 3:45 PM  [facility] [address] [place], [place] [address]  Phone [phone] Fax [phone]

## 2021-04-27 ENCOUNTER — Telehealth: Payer: Self-pay | Admitting: Neurology

## 2021-04-27 ENCOUNTER — Encounter: Payer: Self-pay | Admitting: *Deleted

## 2021-04-27 NOTE — Telephone Encounter (Signed)
Pt called, can you send referral for MRI to [facility]. Would like a call from the nurse.

## 2021-04-28 NOTE — Telephone Encounter (Signed)
I called patient and let her know that we will obtain auth from [facility] for MRI & then we will send to [facility]. They will call her to schedule. She was appreciative.

## 2021-05-01 ENCOUNTER — Other Ambulatory Visit: Payer: Self-pay

## 2021-05-01 ENCOUNTER — Ambulatory Visit
Admission: RE | Admit: 2021-05-01 | Discharge: 2021-05-01 | Disposition: A | Discharge status: Home / Self Care | Payer: 59 | Source: Ambulatory Visit | Attending: Neurology | Admitting: Neurology

## 2021-05-01 DIAGNOSIS — Q0700 Arnold-Chiari syndrome without spina bifida or hydrocephalus: Secondary | ICD-10-CM

## 2021-05-04 ENCOUNTER — Telehealth: Payer: Self-pay | Admitting: Neurology

## 2021-05-04 ENCOUNTER — Other Ambulatory Visit: Payer: 59

## 2021-05-04 NOTE — Telephone Encounter (Signed)
I called the patient.  Minimal change in the Janet [name] is seen since 2017.  If the propranolol is not effective in treating her headaches, she will call our office.  We may consider use of Diamox in the future.   MRI brain 05/03/21:  IMPRESSION: This MRI of the brain without contrast shows the following: 1.   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  Compared to the MRI from 2017, there has been slight progression of an additional 1 mm. 2.   The brain is otherwise normal.

## 2021-05-06 ENCOUNTER — Telehealth: Payer: Self-pay | Admitting: Podiatry

## 2021-05-06 DIAGNOSIS — M722 Plantar fascial fibromatosis: Secondary | ICD-10-CM

## 2021-05-06 DIAGNOSIS — M958 Other specified acquired deformities of musculoskeletal system: Secondary | ICD-10-CM

## 2021-05-06 NOTE — Telephone Encounter (Signed)
Patient called stating  she is suppose to start Physical therapy on 05/10/21  she states she has not received anything from them yet.

## 2021-05-12 ENCOUNTER — Encounter: Payer: Self-pay | Admitting: Podiatry

## 2021-05-12 NOTE — Telephone Encounter (Signed)
Can you check with [facility] to what's happening with this? She was referred to the [facility] office

## 2021-05-27 ENCOUNTER — Ambulatory Visit (INDEPENDENT_AMBULATORY_CARE_PROVIDER_SITE_OTHER): Payer: 59 | Admitting: Podiatry

## 2021-05-27 ENCOUNTER — Other Ambulatory Visit: Payer: Self-pay

## 2021-05-27 DIAGNOSIS — M958 Other specified acquired deformities of musculoskeletal system: Secondary | ICD-10-CM

## 2021-05-27 DIAGNOSIS — M722 Plantar fascial fibromatosis: Secondary | ICD-10-CM

## 2021-05-27 MED ORDER — METHYLPREDNISOLONE 4 MG PO TBPK: ORAL_TABLET | ORAL | 0 refills | Status: DC

## 2021-06-01 NOTE — Progress Notes (Signed)
  Subjective:  Patient ID: Janet [name], female    DOB: 09/03/1986,  MRN: [id number]  Chief Complaint  Patient presents with   Routine Post Op    POV #4 DOS 03/12/2021 TOPAZ PROC B/L PLANTAR FASCIA, RT ANKLE ARTHROSCOPY & TREATMENT OF CARTILAGE LESION W/BONE GRAFTING FROM HEEL & GRAFT FOR CARTILAGE    DOS: 03/12/2021 Procedure: Bilateral plantar fascial Topaz, arthroscopic treatment of osteochondral defect of right ankle  35 y.o. female returns for post-op check.  She states that she has fallen twice since the last visit but was in the boot.  Pain is mostly on the bottom of the foot.  The ankle feels pretty good overall.  The ankle brace is very helpful.  Has been swelling and she has been icing it.  Review of Systems: Negative except as noted in the HPI. Denies N/V/F/Ch.   Objective:  There were no vitals filed for this visit. There is no height or weight on file to calculate BMI. Constitutional Well developed. Well nourished.  Vascular Foot warm and well perfused. Capillary refill normal to all digits.   Neurologic Normal speech. Oriented to person, place, and time. Epicritic sensation to light touch grossly present bilaterally.  Dermatologic Skin healing well without signs of infection. Skin edges well coapted without signs of infection.  Orthopedic: Tenderness to palpation noted about the surgical site.  Good range of motion, she has mild tenderness at the end range of motion in the ankle.  Minimal edema.  She does have sharp tender pain on the bottom of the heel and in the tarsal tunnel   Radiographs: 3 views of the right ankle taken lucency in the talar dome is no longer notable Assessment:   1. Osteochondral defect of talus   2. Plantar fasciitis of left foot   3. Plantar fasciitis of right foot      Plan:  Patient was evaluated and treated and all questions answered.  S/p foot surgery bilaterally -Overall as far as her ankle is concerned she is doing fairly well.  I  recommend continuing physical therapy for now and using the ankle brace as needed. -A lot of the symptoms she is having is on the plantar foot around the area of the Topaz procedure and seems neuritic in nature.  Possible still Planter fasciitis.  May want to consider injection therapy at next visit if not improving.  Prescribed her methylprednisolone taper for anti-inflammatory effect  Return in about 3 weeks (around 06/17/2021) for re-check ankle .

## 2021-06-17 ENCOUNTER — Ambulatory Visit (INDEPENDENT_AMBULATORY_CARE_PROVIDER_SITE_OTHER): Payer: 59 | Admitting: Podiatry

## 2021-06-17 ENCOUNTER — Other Ambulatory Visit: Payer: Self-pay

## 2021-06-17 ENCOUNTER — Ambulatory Visit (INDEPENDENT_AMBULATORY_CARE_PROVIDER_SITE_OTHER): Payer: 59

## 2021-06-17 DIAGNOSIS — M958 Other specified acquired deformities of musculoskeletal system: Secondary | ICD-10-CM | POA: Diagnosis not present

## 2021-06-17 DIAGNOSIS — M722 Plantar fascial fibromatosis: Secondary | ICD-10-CM

## 2021-06-17 DIAGNOSIS — M62461 Contracture of muscle, right lower leg: Secondary | ICD-10-CM

## 2021-06-17 DIAGNOSIS — M216X1 Other acquired deformities of right foot: Secondary | ICD-10-CM

## 2021-06-17 DIAGNOSIS — M21861 Other specified acquired deformities of right lower leg: Secondary | ICD-10-CM

## 2021-06-17 DIAGNOSIS — G5751 Tarsal tunnel syndrome, right lower limb: Secondary | ICD-10-CM | POA: Diagnosis not present

## 2021-06-17 MED ORDER — GABAPENTIN 300 MG PO CAPS: 300.0000 mg | ORAL_CAPSULE | Freq: Two times a day (BID) | ORAL | 0 refills | Status: DC

## 2021-06-18 NOTE — Progress Notes (Signed)
  Subjective:  Patient ID: Janet [name], female    DOB: Nov 22, 1985,  MRN: [id number]  Chief Complaint  Patient presents with   Ankle Pain    1 month follow up with new xrays    DOS: 03/12/2021 Procedure: Bilateral plantar fascial Topaz, arthroscopic treatment of osteochondral defect of right ankle  35 y.o. female returns for post-op check.  Has been improving with physical therapy as well as the gabapentin.  Review of Systems: Negative except as noted in the HPI. Denies N/V/F/Ch.   Objective:  There were no vitals filed for this visit. There is no height or weight on file to calculate BMI. Constitutional Well developed. Well nourished.  Vascular Foot warm and well perfused. Capillary refill normal to all digits.   Neurologic Normal speech. Oriented to person, place, and time. Epicritic sensation to light touch grossly present bilaterally.  Dermatologic Skin healing well without signs of infection. Skin edges well coapted without signs of infection.  Orthopedic: Tenderness to palpation noted about the surgical site.  Good range of motion, she has mild tenderness at the end range of motion in the ankle.  Minimal edema.  She does have sharp tender pain on the bottom of the heel and in the tarsal tunnel   Radiographs: 3 views of the right ankle taken lucency in the talar dome is no longer notable Assessment:   1. Osteochondral defect of talus   2. Gastrocnemius equinus of right lower extremity   3. Plantar fasciitis of left foot   4. Plantar fasciitis of right foot   5. Tarsal tunnel syndrome, right      Plan:  Patient was evaluated and treated and all questions answered.  S/p foot surgery bilaterally -Continues to improve and is ready to go back to work.  She still requiring assistive devices including a walker.  Hopeful to transition to cane and eventually with nothing at some point in the future.  She should continue physical therapy and continue the gabapentin for now  and hopefully this will resolve with time and healing.  Return in about 3 months (around 09/16/2021) for follow up from ankle surgery and Topaz procedure .

## 2021-06-20 ENCOUNTER — Other Ambulatory Visit: Payer: Self-pay

## 2021-06-20 ENCOUNTER — Encounter (HOSPITAL_COMMUNITY): Payer: Self-pay

## 2021-06-20 ENCOUNTER — Emergency Department (HOSPITAL_COMMUNITY): Payer: 59

## 2021-06-20 ENCOUNTER — Emergency Department (HOSPITAL_COMMUNITY)
Admission: EM | Admit: 2021-06-20 | Discharge: 2021-06-20 | Disposition: A | Discharge status: Home / Self Care | Payer: 59 | Attending: Emergency Medicine | Admitting: Emergency Medicine

## 2021-06-20 DIAGNOSIS — W2209XA Striking against other stationary object, initial encounter: Secondary | ICD-10-CM | POA: Insufficient documentation

## 2021-06-20 DIAGNOSIS — W19XXXA Unspecified fall, initial encounter: Secondary | ICD-10-CM

## 2021-06-20 DIAGNOSIS — S8011XA Contusion of right lower leg, initial encounter: Secondary | ICD-10-CM | POA: Insufficient documentation

## 2021-06-20 DIAGNOSIS — I10 Essential (primary) hypertension: Secondary | ICD-10-CM | POA: Insufficient documentation

## 2021-06-20 DIAGNOSIS — Y92009 Unspecified place in unspecified non-institutional (private) residence as the place of occurrence of the external cause: Secondary | ICD-10-CM

## 2021-06-20 DIAGNOSIS — S8991XA Unspecified injury of right lower leg, initial encounter: Secondary | ICD-10-CM | POA: Diagnosis present

## 2021-06-20 DIAGNOSIS — Z79899 Other long term (current) drug therapy: Secondary | ICD-10-CM | POA: Diagnosis not present

## 2021-06-20 DIAGNOSIS — M79661 Pain in right lower leg: Secondary | ICD-10-CM | POA: Insufficient documentation

## 2021-06-20 MED ORDER — IBUPROFEN 400 MG PO TABS
400.0000 mg | ORAL_TABLET | Freq: Once | ORAL | Status: AC
  Administered 2021-06-20: 400 mg via ORAL
  Filled 2021-06-20: qty 1

## 2021-06-20 NOTE — Discharge Instructions (Addendum)
Apply ice for 30 minutes at a time, 4 times a day.  Take acetaminophen and/or ibuprofen as needed for pain.  Please note that combining the 2 medications gives you better pain relief than either medication by itself.  Continue with your physical therapy.

## 2021-06-20 NOTE — ED Provider Notes (Signed)
[facility] EMERGENCY DEPARTMENT Provider Note   CSN: [id number] Arrival date & time: 06/20/21  0022     History Chief Complaint  Patient presents with   Janet [name]    Janet [name] is a 35 y.o. female.  The history is provided by the patient.  Fall She has history of hyperlipidemia, prediabetes and comes in following a fall at home.  She had surgery on her right ankle 3 months ago, and just received permission to try ambulating without any assist device.  She did have tonight and fell have struck her right lower leg against her scooter.  She is complaining of pain which she rates 8/10.  She did apply ice and take acetaminophen at home.   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   r/t adrenal tumor; tumor removed, disease resolved   Cushing's syndrome ([place]) 11/27/2013   Gallbladder problem    Gastroesophageal reflux disease    GERD (gastroesophageal reflux disease)    Phreesia 08/12/2020   Heart murmur    Phreesia 08/12/2020   Hematuria 09/18/2015   Hemorrhoids    History of Chiari malformation    Hypertension    Phreesia 08/12/2020   Internal hemorrhoids with other complication [id number]   JAN 2015 R ANT MAR 2015 R POS AND L LAT BAND    Menstrual periods irregular 09/18/2015   Migraine without aura, with intractable migraine, so stated, without mention of status migrainosus 11/11/2013   Palpitations    Patient desires pregnancy 11/27/2013   Plantar fasciitis    Prediabetes    Preeclampsia 05/21/2019   2x/wk testing nst alt w/ bpp/dopp      Deliver @ 37wks        IOL scheduled for 8/19 @ 0830   Pregnant 12/16/2015   SOBOE (shortness of breath on exertion)    Swelling of both lower extremities     Vitamin D deficiency     Patient Active Problem List   Diagnosis Date Noted   At risk for impaired metabolic function [id number]   Hyperlipidemia 12/22/2020   Glucose intolerance (impaired glucose tolerance) 12/22/2020   Abnormal ECG 12/22/2020   Depression 12/08/2020   Cushing's syndrome ([place]) 12/08/2020   Prolapsed internal hemorrhoids, grade 2 11/25/2020   Constipation 11/25/2020   Back pain with radiculopathy 10/20/2020   Grade II hemorrhoids 10/13/2020   Rectal bleeding 08/19/2020   Plantar fasciitis 07/05/2020   Abnormal vaginal bleeding 04/07/2020   Microscopic hematuria 11/08/2019   Persistent proteinuria 11/08/2019   Nail discoloration 08/24/2019   Preeclampsia in postpartum period 06/06/2019   Gestational hypertension 05/29/2019   Essential hypertension 09/26/2016   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/27/2013   Common migraine 11/11/2013   Headache disorder 07/09/2013   Vitamin D deficiency 03/06/2013   Prediabetes 03/06/2013   Gastroesophageal reflux disease 03/06/2013   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   COLONOSCOPY WITH PROPOFOL N/A 08/31/2020   Non-bleeding internal hemorrhoids. Colon torturous.    ESOPHAGOGASTRODUODENOSCOPY N/A 04/19/2013   [id number] Non-erosive gastritis/PERI-UMBILICAL PAIN DUE TO GERD, GASTRITIS, AND CONSTIPATION. Bx with mild chronic inactive gastritis. No H.Pylori   HEMORRHOID BANDING  2015   Dr. Gala Romney- in office banding procedure   LAPAROSCOPIC  ADRENALECTOMY  10/28/2010   TONSILLECTOMY     tumor removed left adrenal gland 01/12       OB History     Gravida  2   Para  2   Term  2   Preterm  0   AB  0   Living  2      SAB  0   IAB  0   Ectopic  0   Multiple  0   Live Births  2           Family History  Problem Relation Age of Onset   Hypertension Mother    Hyperlipidemia Mother    Colon polyps Mother    Hypertension Father    Colon polyps Father    Sleep apnea Father    Obesity Father    Hypertension Maternal Grandmother    Diabetes Maternal Grandmother    Deep vein thrombosis Maternal Grandmother    Diabetes Paternal Grandmother    COPD Paternal Grandfather    Heart disease Paternal Grandfather    Colon cancer Other        maternal great uncle   Stroke Maternal Aunt    Migraines Maternal Aunt    Deep vein thrombosis Sister    Other Daughter        pulmonary valve stenosis    Social History   Tobacco Use   Smoking status: Never   Smokeless tobacco: Never  Vaping Use   Vaping Use: Never used  Substance Use Topics   Alcohol use: No   Drug use: No    Home Medications Prior to Admission medications   Medication Sig Start Date End Date Taking? Authorizing Provider  ALPRAZolam Duanne Moron) 0.5 MG tablet Take 2 tablets approximately 45 minutes prior to the MRI study, take a third tablet if needed. 04/26/21   Kathrynn Ducking, MD  butalbital-acetaminophen-caffeine (FIORICET) [phone] MG tablet Take one tablet up to two times daily , as needed, for severe headache Patient not taking: Reported on 04/26/2021 08/13/20   [name], MD  ergocalciferol (VITAMIN D2) 1.25 MG (50000 UT) capsule 1 po q wed, and 1 po q sun 01/05/21   Opalski, Neoma Laming, DO  gabapentin (NEURONTIN) 300 MG capsule Take 1 capsule (300 mg total) by mouth 2 (two) times daily. 06/17/21 09/15/21  Criselda Peaches, DPM  methylPREDNISolone (MEDROL DOSEPAK) 4 MG TBPK tablet 6 day dose pack - take as directed 05/27/21   Criselda Peaches, DPM  Multiple Vitamin (MULTIVITAMIN) tablet Take 1 tablet by mouth daily.    [provider]  pantoprazole (PROTONIX) 40 MG tablet Take one tablet by mouth once daily, as needed, for heartburn Patient taking differently: Take 40 mg by mouth daily. 06/30/20   [name], MD  propranolol (INDERAL) 20 MG tablet Take 1 tablet (20 mg total) by mouth 2 (two) times daily. 04/26/21   Kathrynn Ducking, MD    Allergies    Methylprednisolone, Penicillins, Latex, and  Neomycin  Review of Systems   Review of Systems  All other systems reviewed and are negative.  Physical Exam Updated Vital Signs BP (!) 142/92   Pulse 90   Temp 98.2 F (36.8 C) (Oral)   Resp 18   Ht '5\' 4"'$  (1.626 m)   Wt 104 kg   SpO2 100%   BMI 39.36 kg/m   Physical Exam Vitals and nursing note reviewed.  35 year old female, resting comfortably and in no acute distress. Vital signs are significant for borderline elevated blood pressure. Oxygen saturation is 100%, which is normal. Head is normocephalic and atraumatic. PERRLA, EOMI. Oropharynx is clear. Neck is nontender and supple without adenopathy or JVD. Back is nontender and there is no CVA tenderness. Lungs are clear without rales, wheezes, or rhonchi. Chest is nontender. Heart has regular rate and rhythm without murmur. Abdomen is soft, flat, nontender without masses or hepatosplenomegaly and peristalsis is normoactive. Extremities: There is slight soft tissue swelling over the anterior aspect of the right lower leg about midway between the knee and the ankle with significant tenderness at that location.  There is no instability.  Distal neurovascular exam is intact with strong pulses, prompt capillary refill, normal sensation. Skin is warm and dry without rash. Neurologic: Mental status is normal, cranial nerves are intact, there are no motor or sensory deficits.  ED Results / Procedures / Treatments    Radiology DG Tibia/Fibula  Right  Result Date: 06/20/2021 CLINICAL DATA:  Fall EXAM: RIGHT TIBIA AND FIBULA - 2 VIEW COMPARISON:  None. FINDINGS: No fracture or dislocation is seen. The joint spaces are preserved. Visualized soft tissues are within normal limits. IMPRESSION: Negative. Electronically Signed   By: Julian Hy M.D.   On: 06/20/2021 01:04    Procedures Procedures   Medications Ordered in ED Medications  ibuprofen (ADVIL) tablet 400 mg (400 mg Oral Given 06/20/21 0114)    ED Course  I have reviewed the triage vital signs and the nursing notes.  Pertinent imaging results that were available during my care of the patient were reviewed by me and considered in my medical decision making (see chart for details).   MDM Rules/Calculators/A&P                         Fall with injury to right lower leg.  X-ray has been ordered.  Old records are reviewed confirming surgery for osteochondral defect of the right ankle in June of this year.  X-rays show no evidence of fracture.  Patient is advised on ice and elevation, told to use over-the-counter acetaminophen and ibuprofen as needed for pain.  She is to continue with her outpatient physical therapy and follow-up with her podiatrist.  Final Clinical Impression(s) / ED Diagnoses Final diagnoses:  Fall in home, initial encounter  Contusion of right lower leg, initial encounter    Rx / DC Orders ED Discharge Orders     None        Delora Fuel, MD [id number] [phone]

## 2021-06-20 NOTE — ED Triage Notes (Signed)
Pt recently had right ankle surgery, currently has been released back to work starting Monday, pt fell couple of hours ago and now has knot/bruise to right shin-pt says she wants to make sure it didn't mess up anything with ankle since recent surgery.

## 2021-06-21 ENCOUNTER — Other Ambulatory Visit (INDEPENDENT_AMBULATORY_CARE_PROVIDER_SITE_OTHER): Payer: Self-pay | Admitting: Family Medicine

## 2021-06-21 ENCOUNTER — Other Ambulatory Visit: Payer: Self-pay | Admitting: Family Medicine

## 2021-06-21 DIAGNOSIS — E559 Vitamin D deficiency, unspecified: Secondary | ICD-10-CM

## 2021-06-21 MED ORDER — PANTOPRAZOLE SODIUM 40 MG PO TBEC: DELAYED_RELEASE_TABLET | ORAL | 0 refills | Status: DC

## 2021-06-21 NOTE — Telephone Encounter (Signed)
Last OV with Dr Opalski 

## 2021-06-29 ENCOUNTER — Ambulatory Visit: Payer: 59 | Admitting: Endocrinology

## 2021-07-30 ENCOUNTER — Ambulatory Visit: Payer: 59 | Admitting: Family Medicine

## 2021-08-02 ENCOUNTER — Ambulatory Visit: Payer: 59 | Admitting: Family Medicine

## 2021-08-06 ENCOUNTER — Other Ambulatory Visit: Payer: Self-pay

## 2021-08-06 ENCOUNTER — Encounter: Payer: Self-pay | Admitting: Family Medicine

## 2021-08-06 ENCOUNTER — Ambulatory Visit (INDEPENDENT_AMBULATORY_CARE_PROVIDER_SITE_OTHER): Payer: 59 | Admitting: Family Medicine

## 2021-08-06 DIAGNOSIS — E559 Vitamin D deficiency, unspecified: Secondary | ICD-10-CM

## 2021-08-06 DIAGNOSIS — Z8639 Personal history of other endocrine, nutritional and metabolic disease: Secondary | ICD-10-CM | POA: Diagnosis not present

## 2021-08-06 DIAGNOSIS — Z23 Encounter for immunization: Secondary | ICD-10-CM

## 2021-08-06 DIAGNOSIS — G43009 Migraine without aura, not intractable, without status migrainosus: Secondary | ICD-10-CM

## 2021-08-06 MED ORDER — PANTOPRAZOLE SODIUM 40 MG PO TBEC: DELAYED_RELEASE_TABLET | ORAL | 6 refills | Status: DC

## 2021-08-06 MED ORDER — ERGOCALCIFEROL 1.25 MG (50000 UT) PO CAPS: ORAL_CAPSULE | ORAL | 6 refills | Status: DC

## 2021-08-06 NOTE — Patient Instructions (Signed)
F/U end February, call if you need me sooner  Flu vaccine today  I am referring you to [facility] Endo clinic re abnormal weight gain and h/o cushing's for evaluation and management  Continue to be diligent, limit calories to 1200 / day we will give you a diet sheet and you can count on your own also  Thanks for choosing [facility], we consider it a privelige to serve you.

## 2021-08-09 DIAGNOSIS — Z8639 Personal history of other endocrine, nutritional and metabolic disease: Secondary | ICD-10-CM | POA: Insufficient documentation

## 2021-08-09 NOTE — Assessment & Plan Note (Signed)
Responds to fioricet

## 2021-08-09 NOTE — Assessment & Plan Note (Signed)
History of Cushings, now presenting with ongoing excessive weight gain, Endocirne consult for re assesment, has gradually increasing elevated TPO levels also

## 2021-08-09 NOTE — Progress Notes (Signed)
   Janet [name]     MRN: [id number]      DOB: 1986-06-12   HPI Janet [name] is here for follow up and re-evaluation of chronic medical conditions, medication management and review of any available recent lab and radiology data.  Preventive health is updated, specifically  Cancer screening and Immunization.   Just starting at her 2nd weight loss clinic and is really frustrated as she continues to gain weight despite best efforts as far as diet control In the past she was diagnosed with Cushings and was able to lose a significant amount of weight however since her most recent pregnancy, no weight loss success. Also notes hair loss in crown of head. Recently had right ankle / foot surgery with good results Recent labs are reviewed from the Clinic she recently had and anti TPO is elevated and has been increasing over the years , normal TSH and free T3 normal  ROS: Denies recent fever or chills. Denies sinus pressure, nasal congestion, ear pain or sore throat. Denies chest congestion, productive cough or wheezing. Denies chest pains, palpitations and leg swelling Denies abdominal pain, nausea, vomiting,diarrhea or constipation.   Denies dysuria, frequency, hesitancy or incontinence. Denies headaches, seizures, numbness, or tingling. Denies depression, anxiety or insomnia. Denies skin break down or rash.   PE  BP 131/82   Pulse 69   Resp (!) 69   Ht 5\' 4"  (1.626 m)   Wt 237 lb 1.3 oz (107.5 kg)   SpO2 99%   BMI 40.69 kg/m   Patient alert and oriented and in no cardiopulmonary distress.  HEENT: No facial asymmetry, EOMI,     Neck supple .  Chest: Clear to auscultation bilaterally.  CVS: S1, S2 no murmurs, no S3.Regular rate.  ABD: Soft non tender.   Ext: No edema  MS: Adequate ROM spine, shoulders, hips and knees.Reduced ROM right sakle  Skin: Intact, no ulcerations or rash noted.  Psych: Good eye contact, normal affect. Memory intact not anxious or depressed  appearing.  CNS: CN 2-12 intact, power,  normal throughout.no focal deficits noted.   Assessment & Plan  H/O Cushing's syndrome History of Cushings, now presenting with ongoing excessive weight gain, Endocirne consult for re assesment, has gradually increasing elevated TPO levels also   Morbid obesity ([place])  Patient re-educated about  the importance of commitment to a  minimum of 150 minutes of exercise per week as able.  The importance of healthy food choices with portion control discussed, as well as eating regularly and within a 12 hour window most days. The need to choose "clean , green" food 50 to 75% of the time is discussed, as well as to make water the primary drink and set a goal of 64 ounces water daily.    Weight /BMI 08/06/2021 06/20/2021 04/26/2021  WEIGHT 237 lb 1.3 oz 229 lb 4.5 oz -  HEIGHT 5\' 4"  5\' 4"  5\' 4"   BMI 40.69 kg/m2 39.36 kg/m2 39.48 kg/m2    Refer [facility] for eval and management  Common migraine Responds to fioricet

## 2021-08-09 NOTE — Assessment & Plan Note (Signed)
  Patient re-educated about  the importance of commitment to a  minimum of 150 minutes of exercise per week as able.  The importance of healthy food choices with portion control discussed, as well as eating regularly and within a 12 hour window most days. The need to choose "clean , green" food 50 to 75% of the time is discussed, as well as to make water the primary drink and set a goal of 64 ounces water daily.    Weight /BMI 08/06/2021 06/20/2021 04/26/2021  WEIGHT 237 lb 1.3 oz 229 lb 4.5 oz -  HEIGHT 5\' 4"  5\' 4"  5\' 4"   BMI 40.69 kg/m2 39.36 kg/m2 39.48 kg/m2    Refer [facility] for eval and management

## 2021-09-23 ENCOUNTER — Other Ambulatory Visit: Payer: Self-pay

## 2021-09-23 ENCOUNTER — Ambulatory Visit (INDEPENDENT_AMBULATORY_CARE_PROVIDER_SITE_OTHER): Payer: 59 | Admitting: Podiatry

## 2021-09-23 DIAGNOSIS — G5751 Tarsal tunnel syndrome, right lower limb: Secondary | ICD-10-CM

## 2021-09-23 DIAGNOSIS — M722 Plantar fascial fibromatosis: Secondary | ICD-10-CM | POA: Diagnosis not present

## 2021-09-23 DIAGNOSIS — M958 Other specified acquired deformities of musculoskeletal system: Secondary | ICD-10-CM | POA: Diagnosis not present

## 2021-09-23 NOTE — Progress Notes (Signed)
°  Subjective:  Patient ID: Janet [name], female    DOB: September 11, 1986,  MRN: [id number]  Chief Complaint  Patient presents with   Ankle Problem      follow up from ankle surgery and Topaz procedure     DOS: 03/12/2021 Procedure: Bilateral plantar fascial Topaz, arthroscopic treatment of osteochondral defect of right ankle  35 y.o. female returns for post-op check.  Has improved quite a bit.  The gabapentin was helpful but she is no longer taking it she does not want to take chronic meds.  Still doing physical therapy.  Now able to ambulate with a cane it does hurt on certain days especially with changes in weather  Review of Systems: Negative except as noted in the HPI. Denies N/V/F/Ch.   Objective:  There were no vitals filed for this visit. There is no height or weight on file to calculate BMI. Constitutional Well developed. Well nourished.  Vascular Foot warm and well perfused. Capillary refill normal to all digits.   Neurologic Normal speech. Oriented to person, place, and time. Epicritic sensation to light touch grossly present bilaterally.  Dermatologic Skin healing well without signs of infection. Skin edges well coapted without signs of infection.  Orthopedic: Tenderness to palpation noted about the surgical site.  Good range of motion, she has mild tenderness at the end range of motion in the ankle.  Minimal edema.  She does have sharp tender pain on the bottom of the heel and in the tarsal tunnel   Radiographs: 3 views of the right ankle taken lucency in the talar dome is no longer notable Assessment:   1. Osteochondral defect of talus   2. Plantar fasciitis of right foot   3. Tarsal tunnel syndrome, right      Plan:  Patient was evaluated and treated and all questions answered.  S/p foot surgery bilaterally -Overall continues to improve albeit slowly.  I had hoped she would be nearly 100% by now.  I think with her continued progress lets continue physical therapy  and work on transitioning out of ankle brace and a cane and ambulating normally in regular shoe gear.  If not better in 3 months would recommend a new MRI to reevaluate the ankle joint and lateral ankle ligaments.  We discussed the option of repeat arthroscopy if there is any synovitis or impinging structures

## 2021-09-30 ENCOUNTER — Encounter: Payer: Self-pay | Admitting: Family Medicine

## 2021-09-30 ENCOUNTER — Other Ambulatory Visit: Payer: Self-pay

## 2021-09-30 ENCOUNTER — Ambulatory Visit (INDEPENDENT_AMBULATORY_CARE_PROVIDER_SITE_OTHER): Payer: 59 | Admitting: Family Medicine

## 2021-09-30 VITALS — BP 150/92 | HR 86 | Resp 15 | Ht 64.0 in | Wt 242.8 lb

## 2021-09-30 DIAGNOSIS — R635 Abnormal weight gain: Secondary | ICD-10-CM

## 2021-09-30 DIAGNOSIS — I10 Essential (primary) hypertension: Secondary | ICD-10-CM | POA: Insufficient documentation

## 2021-09-30 DIAGNOSIS — R7302 Impaired glucose tolerance (oral): Secondary | ICD-10-CM

## 2021-09-30 DIAGNOSIS — E270 Other adrenocortical overactivity: Secondary | ICD-10-CM | POA: Diagnosis not present

## 2021-09-30 MED ORDER — HYDROCHLOROTHIAZIDE 25 MG PO TABS: 25.0000 mg | ORAL_TABLET | Freq: Every day | ORAL | 3 refills | Status: DC

## 2021-09-30 MED ORDER — POTASSIUM CHLORIDE CRYS ER 20 MEQ PO TBCR: 20.0000 meq | EXTENDED_RELEASE_TABLET | Freq: Every day | ORAL | 3 refills | Status: DC

## 2021-09-30 NOTE — Patient Instructions (Signed)
F/U in 8 weeks , call if you need me sooner  New for blood pressure is HCTZ 25 mg one daily and potassium 20 meq one daily  It is important that you exercise regularly at least 30 minutes 5 times a week. If you develop chest pain, have severe difficulty breathing, or feel very tired, stop exercising immediately and seek medical attention   Think about what you will eat, plan ahead. Choose " clean, green, fresh or frozen" over canned, processed or packaged foods which are more sugary, salty and fatty. 70 to 75% of food eaten should be vegetables and fruit. Three meals at set times with snacks allowed between meals, but they must be fruit or vegetables. Aim to eat over a 12 hour period , example 7 am to 7 pm, and STOP after  your last meal of the day. Drink water,generally about 64 ounces per day, no other drink is as healthy. Fruit juice is best enjoyed in a healthy way, by EATING the fruit.  Thanks for choosing [facility], we consider it a privelige to serve you.

## 2021-09-30 NOTE — Assessment & Plan Note (Signed)
DASH diet and commitment to daily physical activity for a minimum of 30 minutes discussed and encouraged, as a part of hypertension management. The importance of attaining a healthy weight is also discussed.  BP/Weight 09/30/2021 08/06/2021 06/20/2021 04/26/2021 01/13/2021 01/05/2021 1/99/1444  Systolic BP 584 835 075 732 - 256 720  Diastolic BP 92 82 92 919 - 92 85  Wt. (Lbs) 242.8 237.08 229.28 - 230 233 230  BMI 41.68 40.69 39.36 39.48 40.74 41.27 40.74     Start hCTZ 25 mg daily and potassium 20 meq daily, re eval in 8 weeks

## 2021-10-05 ENCOUNTER — Encounter: Payer: Self-pay | Admitting: Family Medicine

## 2021-10-05 NOTE — Progress Notes (Signed)
Janet [name]     MRN: [id number]      DOB: 20-Mar-1986   HPI Janet [name] is here for follow up and re-evaluation of chronic medical conditions, medication management and review of any available recent lab and radiology data.  Preventive health is updated, specifically  Cancer screening and Immunization.   Questions or concerns regarding consultations or procedures which the PT has had in the interim are  addressed. The PT denies any adverse reactions to current medications since the last visit.  Continues to gain weight which is distressing  her  ROS Denies recent fever or chills. Denies sinus pressure, nasal congestion, ear pain or sore throat. Denies chest congestion, productive cough or wheezing. Denies chest pains, palpitations and leg swelling Denies abdominal pain, nausea, vomiting,diarrhea or constipation.   Denies dysuria, frequency, hesitancy or incontinence. Denies joint pain, swelling and limitation in mobility. Denies headaches, seizures, numbness, or tingling. Denies depression, anxiety or insomnia. Denies skin break down or rash.   PE  BP (!) 150/92    Pulse 86    Resp 15    Ht 5\' 4"  (1.626 m)    Wt 242 lb 12.8 oz (110.1 kg)    SpO2 98%    BMI 41.68 kg/m   Patient alert and oriented and in no cardiopulmonary distress.  HEENT: No facial asymmetry, EOMI,     Neck supple .  Chest: Clear to auscultation bilaterally.  CVS: S1, S2 no murmurs, no S3.Regular rate.  ABD: Soft non tender.   Ext: No edema  MS: Adequate ROM spine, shoulders, hips and knees.  Skin: Intact, no ulcerations or rash noted.  Psych: Good eye contact, normal affect. Memory intact not anxious or depressed appearing.  CNS: CN 2-12 intact, power,  normal throughout.no focal deficits noted.   Assessment & Plan  Hypertension DASH diet and commitment to daily physical activity for a minimum of 30 minutes discussed and encouraged, as a part of hypertension management. The importance of  attaining a healthy weight is also discussed.  BP/Weight 09/30/2021 08/06/2021 06/20/2021 04/26/2021 01/13/2021 01/05/2021 9/56/2130  Systolic BP 865 784 696 295 - 284 132  Diastolic BP 92 82 92 440 - 92 85  Wt. (Lbs) 242.8 237.08 229.28 - 230 233 230  BMI 41.68 40.69 39.36 39.48 40.74 41.27 40.74     Start hCTZ 25 mg daily and potassium 20 meq daily, re eval in 8 weeks  Essential hypertension DASH diet and commitment to daily physical activity for a minimum of 30 minutes discussed and encouraged, as a part of hypertension management. The importance of attaining a healthy weight is also discussed.  BP/Weight 09/30/2021 08/06/2021 06/20/2021 04/26/2021 01/13/2021 01/05/2021 10/12/7251  Systolic BP 664 403 474 259 - 563 875  Diastolic BP 92 82 92 643 - 92 85  Wt. (Lbs) 242.8 237.08 229.28 - 230 233 230  BMI 41.68 40.69 39.36 39.48 40.74 41.27 40.74     Start HCTZ 25 mg daily , re eval in 8 weeks  Weight gain  Patient re-educated about  the importance of commitment to a  minimum of 150 minutes of exercise per week as able.  The importance of healthy food choices with portion control discussed, as well as eating regularly and within a 12 hour window most days. The need to choose "clean , green" food 50 to 75% of the time is discussed, as well as to make water the primary drink and set a goal of 64 ounces water daily.  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

## 2021-10-05 NOTE — Assessment & Plan Note (Signed)
°  Patient re-educated about  the importance of commitment to a  minimum of 150 minutes of exercise per week as able.  The importance of healthy food choices with portion control discussed, as well as eating regularly and within a 12 hour window most days. The need to choose "clean , green" food 50 to 75% of the time is discussed, as well as to make water the primary drink and set a goal of 64 ounces water daily.    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

## 2021-10-05 NOTE — Assessment & Plan Note (Signed)
Patient educated about the importance of limiting  Carbohydrate intake , the need to commit to daily physical activity for a minimum of 30 minutes , and to commit weight loss. The fact that changes in all these areas will reduce or eliminate all together the development of diabetes is stressed.   Diabetic Labs Latest Ref Rng & Units 12/08/2020 03/20/2020 10/07/2019 08/20/2019 06/06/2019  HbA1c 4.8 - 5.6 % 6.2(H) - - - -  Chol 100 - 199 mg/dL 180 167 - - -  HDL >39 mg/dL 49 54 - - -  Calc LDL 0 - 99 mg/dL 118(H) 99 - - -  Triglycerides 0 - 149 mg/dL 70 55 - - -  Creatinine 0.57 - 1.00 mg/dL 0.77 0.71 0.80 0.68 0.67   BP/Weight 09/30/2021 08/06/2021 06/20/2021 04/26/2021 01/13/2021 01/05/2021 3/38/2505  Systolic BP 397 673 419 379 - 024 097  Diastolic BP 92 82 92 353 - 92 85  Wt. (Lbs) 242.8 237.08 229.28 - 230 233 230  BMI 41.68 40.69 39.36 39.48 40.74 41.27 40.74   No flowsheet data found.

## 2021-10-05 NOTE — Assessment & Plan Note (Signed)
Currently being evaluated at [facility] for probable recurrence of disease, will follow

## 2021-10-05 NOTE — Assessment & Plan Note (Signed)
DASH diet and commitment to daily physical activity for a minimum of 30 minutes discussed and encouraged, as a part of hypertension management. The importance of attaining a healthy weight is also discussed.  BP/Weight 09/30/2021 08/06/2021 06/20/2021 04/26/2021 01/13/2021 01/05/2021 4/62/1947  Systolic BP 125 271 292 909 - 030 149  Diastolic BP 92 82 92 969 - 92 85  Wt. (Lbs) 242.8 237.08 229.28 - 230 233 230  BMI 41.68 40.69 39.36 39.48 40.74 41.27 40.74     Start HCTZ 25 mg daily , re eval in 8 weeks

## 2021-10-12 ENCOUNTER — Ambulatory Visit: Payer: 59 | Admitting: Endocrinology

## 2021-10-13 ENCOUNTER — Ambulatory Visit: Payer: 59 | Admitting: Endocrinology

## 2021-11-11 ENCOUNTER — Telehealth: Payer: Self-pay | Admitting: *Deleted

## 2021-11-11 ENCOUNTER — Other Ambulatory Visit: Payer: Self-pay | Admitting: Sports Medicine

## 2021-11-11 MED ORDER — TRAMADOL HCL 50 MG PO TABS
50.0000 mg | ORAL_TABLET | Freq: Three times a day (TID) | ORAL | 0 refills | Status: AC | PRN
Start: 2021-11-11 — End: 2021-11-16

## 2021-11-11 NOTE — Progress Notes (Signed)
Sent Tramadol for pain

## 2021-11-11 NOTE — Telephone Encounter (Signed)
Patient was advised to call by PT,because of above the ankle swelling, painful,surgery  1 year ago. Please schedule for a sooner appointment.(12/23/21) per Dr Cannon Kettle, 1-2 weeks out if possible. Patient has been scheduled for 11/30/21,confirmed w/ patient and she is needing something for pain as well.

## 2021-11-15 ENCOUNTER — Ambulatory Visit (INDEPENDENT_AMBULATORY_CARE_PROVIDER_SITE_OTHER): Payer: 59

## 2021-11-15 ENCOUNTER — Ambulatory Visit: Payer: 59 | Admitting: Podiatry

## 2021-11-15 ENCOUNTER — Other Ambulatory Visit: Payer: Self-pay

## 2021-11-15 DIAGNOSIS — M958 Other specified acquired deformities of musculoskeletal system: Secondary | ICD-10-CM

## 2021-11-16 NOTE — Progress Notes (Signed)
HPI: 36 y.o. female presenting today for follow-up evaluation of an acute flareup of right ankle pain.  Urgent work in today.  Patient of Dr. Maxie Barb.  Patient has history of left ankle arthroscopy with repair of osteochondral defect to the right ankle.  DOS: 03/12/2021.  Patient states that over the past few days she has noticed a significant increase of swelling and pain.  She has been going to physical therapy but it has exacerbated her pain with swelling and edema.  She has been taking some oral tramadol that was prescribed last week with no improvement.  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   r/t adrenal tumor; tumor removed, disease resolved   Cushing's syndrome ([place]) 11/27/2013   Gallbladder problem    Gastroesophageal reflux disease    GERD (gastroesophageal reflux disease)    Phreesia 08/12/2020   Heart murmur    Phreesia 08/12/2020   Hematuria 09/18/2015   Hemorrhoids    History of Chiari malformation    Hypertension    Phreesia 08/12/2020   Internal hemorrhoids with other complication 4/97/0263   JAN 2015 R ANT MAR 2015 R POS AND L LAT BAND    Menstrual periods irregular 09/18/2015   Migraine without aura, with intractable migraine, so stated, without mention of status migrainosus 11/11/2013   Palpitations    Patient desires pregnancy 11/27/2013   Plantar fasciitis    Prediabetes    Preeclampsia 05/21/2019   2x/wk testing nst alt w/ bpp/dopp      Deliver @ 37wks        IOL scheduled for 8/19 @ 0830   Pregnant 12/16/2015   SOBOE (shortness of breath on exertion)    Swelling of both lower extremities    Vitamin D deficiency     Past Surgical  History:  Procedure Laterality Date   arthroscopic treatment of osteochondral defect of right ankle  03/2021   Bilateral plantar fascial Topaz  03/2021   CHOLECYSTECTOMY  02/2015   COLONOSCOPY N/A 04/19/2013   ZCH:YIFOYD mucosa in the terminal ileum/Single erosion in transverse colon/RECTAL BLEEDING DUE TO Moderate sized internal hemorrhoids/ trv colon erosion, bx benign.   COLONOSCOPY WITH PROPOFOL N/A 08/31/2020   Non-bleeding internal hemorrhoids. Colon torturous.    ESOPHAGOGASTRODUODENOSCOPY N/A 04/19/2013   XAJ:OINO Non-erosive gastritis/PERI-UMBILICAL PAIN DUE TO GERD, GASTRITIS, AND CONSTIPATION. Bx with mild chronic inactive gastritis. No H.Pylori   HEMORRHOID BANDING  2015   Dr. Gala Romney- in office banding procedure   LAPAROSCOPIC ADRENALECTOMY  10/28/2010   TONSILLECTOMY     tumor removed left adrenal gland 01/12      Allergies  Allergen Reactions   Methylprednisolone Shortness Of Breath, Itching and Other (See Comments)    Reaction:  Bruising    Penicillins Anaphylaxis, Rash and Other (See Comments)    Has patient had a PCN reaction causing immediate rash, facial/tongue/throat swelling, SOB or lightheadedness with hypotension: No Has patient had a PCN reaction causing severe rash involving mucus membranes or skin necrosis: No Has patient had a PCN reaction that required hospitalization No Has patient had a PCN reaction  occurring within the last 10 years: No If all of the above answers are "NO", then may proceed with Cephalosporin use.   Latex Rash   Neomycin Rash    Breaks out with neosporin     Physical Exam: General: The patient is alert and oriented x3 in no acute distress.  Dermatology: Skin is warm, dry and supple bilateral lower extremities. Negative for open lesions or macerations.  Vascular: Palpable pedal pulses bilaterally. Capillary refill within normal limits.  No erythema however there is some edema noted diffusely around the ankle joint  Neurological:  Light touch and protective threshold grossly intact  Musculoskeletal Exam: No pedal deformities noted.  Significant tenderness to palpation and range of motion right ankle with associated edema  Radiographic Exam RT ankle today, 11/15/2021:  Normal osseous mineralization. Joint spaces preserved. No fracture/dislocation/boney destruction.  No clear evidence of osteochondral defect  Assessment: 1. H/o RT ankle arthroscopy with repair of osteochondral defect 2.  Acute exacerbation of right ankle pain and swelling   Plan of Care:  1. Patient evaluated. X-Rays reviewed.  2.  Based on notes from prior visit an MRI was considered.  I do believe an MRI is appropriate for additional imaging and to compare to prior MRI.  Order placed for an MRI today 3.  In the meantime recommend that the patient returns to immobilization in the cam boot.  Weightbearing as tolerated 4.  Recommend OTC Motrin as needed 5.  Return to clinic after MRI to review the results with surgeon Dr. Merri Brunette, DPM [facility]  Dr. Edrick Kins, DPM    [address], [place] [address]                Office [phone]  Fax [phone]

## 2021-11-18 ENCOUNTER — Ambulatory Visit
Admission: RE | Admit: 2021-11-18 | Discharge: 2021-11-18 | Disposition: A | Discharge status: Home / Self Care | Payer: 59 | Source: Ambulatory Visit | Attending: Podiatry | Admitting: Podiatry

## 2021-11-18 ENCOUNTER — Other Ambulatory Visit: Payer: Self-pay

## 2021-11-18 DIAGNOSIS — M958 Other specified acquired deformities of musculoskeletal system: Secondary | ICD-10-CM

## 2021-11-20 ENCOUNTER — Other Ambulatory Visit: Payer: 59

## 2021-11-30 ENCOUNTER — Ambulatory Visit: Payer: 59 | Admitting: Podiatry

## 2021-11-30 ENCOUNTER — Other Ambulatory Visit: Payer: Self-pay

## 2021-11-30 DIAGNOSIS — M958 Other specified acquired deformities of musculoskeletal system: Secondary | ICD-10-CM

## 2021-11-30 MED ORDER — MELOXICAM 15 MG PO TABS: 15.0000 mg | ORAL_TABLET | Freq: Every day | ORAL | 0 refills | Status: DC

## 2021-11-30 NOTE — Progress Notes (Signed)
Subjective:  Patient ID: Janet [name], female    DOB: [birth year],  MRN: [id number]  Chief Complaint  Patient presents with   Follow-up    Pt reports she completed MRI- she noticed the pain decrease when using the boot.  Mentioned the swelling remains she has kept foot elevated/ icing as much as poss. Further eval     DOS: 03/12/2021 Procedure: Bilateral plantar fascial Topaz, arthroscopic treatment of osteochondral defect of right ankle  36 y.o. female returns for follow-up from right ankle and bilateral foot surgery.  The right ankle acutely worsened a few weeks ago and was very painful with physical therapy which she has since discontinued, she was seen by my partner Dr. Amalia Hailey who ordered a new MRI which was completed on 11/18/2021.  She has a photo of her ankle that is very swollen for a few days ago.  Review of Systems: Negative except as noted in the HPI. Denies N/V/F/Ch.   Objective:  There were no vitals filed for this visit. There is no height or weight on file to calculate BMI. Constitutional Well developed. Well nourished.  Vascular Foot warm and well perfused. Capillary refill normal to all digits.   Neurologic Normal speech. Oriented to person, place, and time. Epicritic sensation to light touch grossly present bilaterally.  Dermatologic incisions well-healed and not hypertrophic  Orthopedic: She has significant swelling in the right ankle today and pain over the anterior joint line especially medially with range of motion which is limited   Radiographs: 3 views of the right ankle taken lucency in the talar dome is no longer notable   Study Result  Narrative & Impression  CLINICAL DATA:  Right ankle pain and swelling for 1-1/2 weeks. History of surgery in June 2022. Clinical concern for osteochondral lesion.   EXAM: MRI OF THE RIGHT ANKLE WITHOUT CONTRAST   TECHNIQUE: Multiplanar, multisequence MR imaging of the ankle was performed. No intravenous contrast  was administered.   COMPARISON:  X-ray ankle 11/15/2021; MR heel 01/17/2021; X-ray foot 06/11/2020.   FINDINGS: TENDONS   Peroneal: The peroneus longus and brevis tendons are intact.   Posteromedial: Intact tibialis posterior, flexor digitorum longus, and flexor hallucis longus tendons.   Anterior: The tibialis anterior, extensor hallucis longus and extensor digitorum longus tendons are intact.   Achilles: Intact.   Plantar Fascia: Intact. Resolution of the prior perifascial edema around the medial band of the plantar fascia origin.   LIGAMENTS   Lateral: The anterior and posterior talofibular, anterior and posterior tibiofibular, and calcaneofibular ligaments are intact.   Medial: The tibiotalar deep deltoid and tibial spring ligaments are intact.   CARTILAGE   Ankle Joint: There is again an osteochondral defect seen at the far medial aspect of the talar dome. This measures up to approximately 13 mm in AP dimension, 7 mm in transverse dimension, and 7 mm in craniocaudal dimension, similar to prior. There is moderate surrounding marrow edema. The previously seen mild fluid bright cystic change of the deep aspect of the osteochondral defect is no longer visualized. Previously there was a decreased T1 and decreased T2 signal focus within the superior aspect of the osteochondral defect, and this is no longer visualized. This may have been surgically removed. Recommend clinical correlation.   Subtalar Joints/Sinus Tarsi: Fat is preserved within sinus tarsi.   Bones: Normal marrow signal. No acute fracture.   Other: The Lisfranc ligament complex is intact.   There is mild-to-moderate edema and swelling throughout the dorsal lateral midfoot and  throughout the medial and lateral ankle.   The tarsal tunnel is unremarkable.   IMPRESSION: Compared to 01/17/2021:   1. Redemonstration of large medial talar dome osteochondral defect. This is similar in size to prior. The  prior fluid bright signal at the deep aspect is now replaced by marrow edema. Previously there was low signal material at the superior aspect of the osteochondral defect that is no longer visualized. This may have been surgically resected. Displacement of the bone within the superior aspect of the osteochondral defect is another possibility if this was not postsurgical. 2. Resolution of the prior mild plantar fasciitis.     Electronically Signed   By: Yvonne Kendall M.D.   On: 11/18/2021 14:19   Assessment:   1. Osteochondral defect of talus       Plan:  Patient was evaluated and treated and all questions answered.  Unfortunate has acutely worsened, I reviewed the results of the MRI with her both the report and the images in detail, I also independently reviewed the images on my own.  I compared them to her previous MRI preoperatively which to me show an increase in the edema of the surrounding bone of the talus and what appears to be collapse of the subchondral bone into the cyst.  Unfortunately due to its location I think further arthroscopic treatment would be quite difficult.  I discussed with her a medial malleolar osteotomy to access the joint, however at her age and functional level I do not think that autograft from the calcaneus and Cartiform allograft that I could offer her would be sufficient.  Ultimately she may need a bulk fresh talar allograft transplant.  I am sending her an urgent referral to [facility] for consultation for this.  In the interim I recommended she return to the CAM boot for support of the ankle to prevent further injury and collapse.  She previously had a tall CAM boot that is difficult to wear in a dispensed a short cam boot today.  Rx for Mobic was sent to pharmacy for anti-inflammatory and pain relief.  She may continue to take tramadol as needed.  I will see her back as needed I have an appointment scheduled with her in March  that we will keep for now in the event she has any new issues that develop that it may help with or if she is not able to have treatment at [facility]

## 2021-12-03 ENCOUNTER — Other Ambulatory Visit: Payer: Self-pay

## 2021-12-03 ENCOUNTER — Encounter: Payer: Self-pay | Admitting: Family Medicine

## 2021-12-03 ENCOUNTER — Encounter: Payer: Self-pay | Admitting: Adult Health

## 2021-12-03 ENCOUNTER — Ambulatory Visit: Payer: 59 | Admitting: Family Medicine

## 2021-12-03 ENCOUNTER — Ambulatory Visit (INDEPENDENT_AMBULATORY_CARE_PROVIDER_SITE_OTHER): Payer: 59 | Admitting: Adult Health

## 2021-12-03 VITALS — BP 140/94 | HR 71 | Ht 64.0 in | Wt 245.5 lb

## 2021-12-03 VITALS — BP 142/92 | HR 79 | Resp 16 | Ht 64.0 in | Wt 240.0 lb

## 2021-12-03 DIAGNOSIS — Z01419 Encounter for gynecological examination (general) (routine) without abnormal findings: Secondary | ICD-10-CM | POA: Diagnosis not present

## 2021-12-03 DIAGNOSIS — R7303 Prediabetes: Secondary | ICD-10-CM

## 2021-12-03 DIAGNOSIS — G4719 Other hypersomnia: Secondary | ICD-10-CM | POA: Diagnosis not present

## 2021-12-03 DIAGNOSIS — G4733 Obstructive sleep apnea (adult) (pediatric): Secondary | ICD-10-CM

## 2021-12-03 DIAGNOSIS — I10 Essential (primary) hypertension: Secondary | ICD-10-CM | POA: Diagnosis not present

## 2021-12-03 DIAGNOSIS — M958 Other specified acquired deformities of musculoskeletal system: Secondary | ICD-10-CM

## 2021-12-03 MED ORDER — METFORMIN HCL 500 MG PO TABS: 500.0000 mg | ORAL_TABLET | Freq: Two times a day (BID) | ORAL | 3 refills | Status: DC

## 2021-12-03 NOTE — Patient Instructions (Addendum)
Follow-up in 12 to 14 weeks call if you need me sooner.  New medication to be started for elevated blood sugar metformin 500 mg twice daily.  You are prediabetic.  You have been referred to the bariatric surgery as we discussed.  You have been referred for evaluation for sleep apnea based on complaints of fatigue and excessive daytime sleepiness.  Nonfasting HbA1c Chem-7 and EGFR 5 days before your next visit.  Please commit to taking blood pressure medication as prescribed and message me if you have any problems with this.  It is important that you exercise regularly at least 30 minutes 5 times a week. If you develop chest pain, have severe difficulty breathing, or feel very tired, stop exercising immediately and seek medical attention    Think about what you will eat, plan ahead. Choose " clean, green, fresh or frozen" over canned, processed or packaged foods which are more sugary, salty and fatty. 70 to 75% of food eaten should be vegetables and fruit. Three meals at set times with snacks allowed between meals, but they must be fruit or vegetables. Aim to eat over a 12 hour period , example 7 am to 7 pm, and STOP after  your last meal of the day. Drink water,generally about 64 ounces per day, no other drink is as healthy. Fruit juice is best enjoyed in a healthy way, by EATING the fruit.  Thanks for choosing [facility], we consider it a privelige to serve you.

## 2021-12-03 NOTE — Progress Notes (Signed)
Patient ID: Janet [name], female   DOB: [birth year], 36 y.o.   MRN: [id number] History of Present Illness: Janet [name] is a 36 year old black female, engaged, G2P2002, in fro a well woman gyn exam. She had surgery right ankle and is in a boot.  Lab Results  Component Value Date   DIAGPAP  11/26/2020    - Negative for intraepithelial lesion or malignancy (NILM)   HPV NOT DETECTED 10/26/2017   [place] Negative 11/26/2020    PCP is Dr Moshe Cipro.   Current Medications, Allergies, Past Medical History, Past Surgical History, Family History and Social History were reviewed in [employer] record.     Review of Systems: Patient denies any headaches, hearing loss, fatigue, blurred vision, shortness of breath, chest pain, abdominal pain, problems with bowel movements, urination, or intercourse(not active). No joint pain or mood swings.  Has gained weight since not able to walk much    Physical Exam:BP (!) 140/94 (BP Location: Left Arm, Patient Position: Sitting, Cuff Size: Large)    Pulse 71    Ht 5\' 4"  (1.626 m)    Wt 245 lb 8 oz (111.4 kg)    LMP 11/13/2021    BMI 42.14 kg/m   General:  Well developed, well nourished, no acute distress Skin:  Warm and dry Neck:  Midline trachea, normal thyroid, good ROM, no lymphadenopathy Lungs; Clear to auscultation bilaterally Breast:  No dominant palpable mass, retraction, or nipple discharge Cardiovascular: Regular rate and rhythm Abdomen:  Soft, non tender, no hepatosplenomegaly Pelvic:  External genitalia is normal in appearance, no lesions.  The vagina is normal in appearance. Urethra has no lesions or masses. The cervix is bulbous.  Uterus is felt to be normal size, shape, and contour.  No adnexal masses or tenderness noted.Bladder is non tender, no masses felt. Rectal: Deferred  Extremities/musculoskeletal:  No swelling or varicosities noted, no clubbing or cyanosis Psych:  No mood changes, alert and cooperative,seems happy AA  is 0 Fall risk is high Depression screen [facility] 2/9 12/03/2021 08/06/2021 12/08/2020  Decreased Interest 0 0 2  Down, Depressed, Hopeless 0 0 2  PHQ - 2 Score 0 0 4  Altered sleeping 0 0 1  Tired, decreased energy 0 0 3  Change in appetite 0 0 1  Feeling bad or failure about yourself  0 0 0  Trouble concentrating 0 0 0  Moving slowly or fidgety/restless 0 0 0  Suicidal thoughts 0 0 0  PHQ-9 Score 0 0 9  Difficult doing work/chores - - Somewhat difficult  Some recent data might be hidden    GAD 7 : Generalized Anxiety Score 12/03/2021 11/26/2020  Nervous, Anxious, on Edge 0 0  Control/stop worrying 0 0  Worry too much - different things 0 0  Trouble relaxing 0 0  Restless 0 0  Easily annoyed or irritable 0 0  Afraid - awful might happen 0 0  Total GAD 7 Score 0 0      Upstream - 12/03/21 1145       Pregnancy Intention Screening   Does the patient want to become pregnant in the next year? No    Does the patient's partner want to become pregnant in the next year? No    Would the patient like to discuss contraceptive options today? No      Contraception Wrap Up   Current Method Abstinence    End Method Abstinence  Examination chaperoned by Levy Pupa LPN  Impression and Plan: 1. Encounter for well woman exam with routine gynecological exam Pap in 2025 Labs with PCP Physical in 1 year To follow up with Dr Moshe Cipro

## 2021-12-03 NOTE — Progress Notes (Signed)
Janet [name]     MRN: [id number]      DOB: 1985/12/01   HPI Ms. Meany is here for follow up and re-evaluation of chronic medical conditions, medication management and review of any available recent lab and radiology data.  Preventive health is updated, specifically  Cancer screening and Immunization.   Questions or concerns regarding consultations or procedures which the PT has had in the interim are  addressed.No clear answer from Endo at [facility] as far as whether she  has recurrent Cushings , which would contribute to and explain ongoing weight gain. Needs to resubmit saliva specimens for testing Has upcoming appt with Podiatry at [facility] re right ankle and is looking at needing a bone graft from a cadaver to address ongoing ankle problem Now at a point where she is interested in surgery for weight management  Interested in metformin for prediabetes Not compliant with antihypertensive med as she gets normal values at home and at other medical facilities   ROS Denies recent fever or chills. Denies sinus pressure, nasal congestion, ear pain or sore throat. Denies chest congestion, productive cough or wheezing. Denies chest pains, palpitations and leg swelling Denies abdominal pain, nausea, vomiting,diarrhea or constipation.   Denies dysuria, frequency, hesitancy or incontinence.   Denies headaches, seizures, numbness, or tingling. Denies depression, anxiety or insomnia. Denies skin break down or rash.   PE  BP (!) 142/92    Pulse 79    Resp 16    Ht 5\' 4"  (1.626 m)    Wt 240 lb (108.9 kg)    LMP 11/13/2021    SpO2 98%    BMI 41.20 kg/m   Patient alert and oriented and in no cardiopulmonary distress.  HEENT: No facial asymmetry, EOMI,     Neck supple .  Chest: Clear to auscultation bilaterally.  CVS: S1, S2 no murmurs, no S3.Regular rate.  ABD: Soft non tender.   Ext: No edema  MS: Adequate ROM spine, shoulders, hips and knees.  Skin: Intact, no ulcerations or rash  noted.  Psych: Good eye contact, normal affect. Memory intact not anxious or depressed appearing.  CNS: CN 2-12 intact, power,  normal throughout.no focal deficits noted.   Assessment & Plan  Hypertension Uncontrolled, reports not taking medication as she gets normal readings. I have encouraged her to take med as prescribed as readings in the office are persistently elevated DASH diet and commitment to daily physical activity for a minimum of 30 minutes discussed and encouraged, as a part of hypertension management. The importance of attaining a healthy weight is also discussed.  BP/Weight 12/03/2021 12/03/2021 09/30/2021 08/06/2021 06/20/2021 1/95/0932 03/16/1244  Systolic BP 809 983 382 505 397 673 -  Diastolic BP 94 92 92 82 92 101 -  Wt. (Lbs) 245.5 240 242.8 237.08 229.28 - 230  BMI 42.14 41.2 41.68 40.69 39.36 39.48 40.74       Morbid obesity (HCC)  Patient re-educated about  the importance of commitment to a  minimum of 150 minutes of exercise per week as able.  The importance of healthy food choices with portion control discussed, as well as eating regularly and within a 12 hour window most days. The need to choose "clean , green" food 50 to 75% of the time is discussed, as well as to make water the primary drink and set a goal of 64 ounces water daily.    Weight /BMI 12/03/2021 12/03/2021 09/30/2021  WEIGHT 245 lb 8 oz 240 lb 242 lb  12.8 oz  HEIGHT 5\' 4"  5\' 4"  5\' 4"   BMI 42.14 kg/m2 41.2 kg/m2 41.68 kg/m2  ongoing weight gain, reports dietary discretion, has had weight gain associated with cushings in the past, current eval at [facility] no confirmation of underlying endo pathology to date per pt, refer bariatric surgery    Prediabetes Patient educated about the importance of limiting  Carbohydrate intake , the need to commit to daily physical activity for a minimum of 30 minutes , and to commit weight loss. The fact that changes in all these areas will reduce or eliminate all  together the development of diabetes is stressed.  Start daily metformin re eval in 3 to 4 months  Diabetic Labs Latest Ref Rng & Units 12/08/2020 03/20/2020 10/07/2019 08/20/2019 06/06/2019  HbA1c 4.8 - 5.6 % 6.2(H) - - - -  Chol 100 - 199 mg/dL 180 167 - - -  HDL >39 mg/dL 49 54 - - -  Calc LDL 0 - 99 mg/dL 118(H) 99 - - -  Triglycerides 0 - 149 mg/dL 70 55 - - -  Creatinine 0.57 - 1.00 mg/dL 0.77 0.71 0.80 0.68 0.67   BP/Weight 12/03/2021 12/03/2021 09/30/2021 08/06/2021 06/20/2021 04/20/1974 05/17/3253  Systolic BP 982 641 583 094 076 808 -  Diastolic BP 94 92 92 82 92 101 -  Wt. (Lbs) 245.5 240 242.8 237.08 229.28 - 230  BMI 42.14 41.2 41.68 40.69 39.36 39.48 40.74   No flowsheet data found.    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

## 2021-12-06 ENCOUNTER — Encounter: Payer: Self-pay | Admitting: Family Medicine

## 2021-12-06 DIAGNOSIS — G4719 Other hypersomnia: Secondary | ICD-10-CM | POA: Insufficient documentation

## 2021-12-06 DIAGNOSIS — M958 Other specified acquired deformities of musculoskeletal system: Secondary | ICD-10-CM | POA: Insufficient documentation

## 2021-12-06 NOTE — Assessment & Plan Note (Signed)
Managed by Podiatry, record reviewed and worsening, has referral to tertiary ctr, [facility], back in CAM boot

## 2021-12-06 NOTE — Assessment & Plan Note (Signed)
Uncontrolled, reports not taking medication as she gets normal readings. I have encouraged her to take med as prescribed as readings in the office are persistently elevated DASH diet and commitment to daily physical activity for a minimum of 30 minutes discussed and encouraged, as a part of hypertension management. The importance of attaining a healthy weight is also discussed.  BP/Weight 12/03/2021 12/03/2021 09/30/2021 08/06/2021 06/20/2021 0/06/2329 0/04/6225  Systolic BP 333 545 625 638 937 342 -  Diastolic BP 94 92 92 82 92 101 -  Wt. (Lbs) 245.5 240 242.8 237.08 229.28 - 230  BMI 42.14 41.2 41.68 40.69 39.36 39.48 40.74

## 2021-12-06 NOTE — Assessment & Plan Note (Signed)
Patient educated about the importance of limiting  Carbohydrate intake , the need to commit to daily physical activity for a minimum of 30 minutes , and to commit weight loss. The fact that changes in all these areas will reduce or eliminate all together the development of diabetes is stressed.  Start daily metformin re eval in 3 to 4 months  Diabetic Labs Latest Ref Rng & Units 12/08/2020 03/20/2020 10/07/2019 08/20/2019 06/06/2019  HbA1c 4.8 - 5.6 % 6.2(H) - - - -  Chol 100 - 199 mg/dL 180 167 - - -  HDL >39 mg/dL 49 54 - - -  Calc LDL 0 - 99 mg/dL 118(H) 99 - - -  Triglycerides 0 - 149 mg/dL 70 55 - - -  Creatinine 0.57 - 1.00 mg/dL 0.77 0.71 0.80 0.68 0.67   BP/Weight 12/03/2021 12/03/2021 09/30/2021 08/06/2021 06/20/2021 7/97/2820 6/0/1561  Systolic BP 537 943 276 147 092 957 -  Diastolic BP 94 92 92 82 92 101 -  Wt. (Lbs) 245.5 240 242.8 237.08 229.28 - 230  BMI 42.14 41.2 41.68 40.69 39.36 39.48 40.74   No flowsheet data found.

## 2021-12-06 NOTE — Assessment & Plan Note (Signed)
Excessive daytime sleepiness and fatigue with thisck neck and increased abd girth, needs eval for OSA

## 2021-12-06 NOTE — Assessment & Plan Note (Signed)
°  Patient re-educated about  the importance of commitment to a  minimum of 150 minutes of exercise per week as able.  The importance of healthy food choices with portion control discussed, as well as eating regularly and within a 12 hour window most days. The need to choose "clean , green" food 50 to 75% of the time is discussed, as well as to make water the primary drink and set a goal of 64 ounces water daily.    Weight /BMI 12/03/2021 12/03/2021 09/30/2021  WEIGHT 245 lb 8 oz 240 lb 242 lb 12.8 oz  HEIGHT 5\' 4"  5\' 4"  5\' 4"   BMI 42.14 kg/m2 41.2 kg/m2 41.68 kg/m2  ongoing weight gain, reports dietary discretion, has had weight gain associated with cushings in the past, current eval at [facility] no confirmation of underlying endo pathology to date per pt, refer bariatric surgery

## 2021-12-13 ENCOUNTER — Encounter: Payer: Self-pay | Admitting: Family Medicine

## 2021-12-13 ENCOUNTER — Telehealth: Payer: Self-pay | Admitting: Family Medicine

## 2021-12-13 NOTE — Telephone Encounter (Signed)
Patient will sent forms on mychart to be completed ?

## 2021-12-13 NOTE — Telephone Encounter (Signed)
Pt needs to talk to you  ?

## 2021-12-14 ENCOUNTER — Institutional Professional Consult (permissible substitution): Payer: 59 | Admitting: Adult Health

## 2021-12-14 ENCOUNTER — Telehealth: Payer: Self-pay

## 2021-12-14 NOTE — Telephone Encounter (Signed)
Medical Clearance - Bariatric surgery ? ?Copied ?Noted ?Sleeved  ?

## 2021-12-17 DIAGNOSIS — Z0279 Encounter for issue of other medical certificate: Secondary | ICD-10-CM

## 2021-12-17 NOTE — Telephone Encounter (Signed)
Called patient forms are ready for pick up. ?

## 2021-12-22 ENCOUNTER — Telehealth: Payer: Self-pay | Admitting: Family Medicine

## 2021-12-22 NOTE — Telephone Encounter (Signed)
Patient called the office to have paper work faxed in to [place]. ? ? ?Patient bariatric surgery form faxed . ?[phone] ? ? ?

## 2021-12-23 ENCOUNTER — Other Ambulatory Visit: Payer: Self-pay

## 2021-12-23 ENCOUNTER — Ambulatory Visit: Payer: 59 | Admitting: Podiatry

## 2021-12-23 DIAGNOSIS — M958 Other specified acquired deformities of musculoskeletal system: Secondary | ICD-10-CM | POA: Diagnosis not present

## 2021-12-24 ENCOUNTER — Ambulatory Visit (INDEPENDENT_AMBULATORY_CARE_PROVIDER_SITE_OTHER): Payer: 59 | Admitting: Primary Care

## 2021-12-24 DIAGNOSIS — R400 Somnolence: Secondary | ICD-10-CM

## 2021-12-24 DIAGNOSIS — G4719 Other hypersomnia: Secondary | ICD-10-CM

## 2021-12-24 NOTE — Patient Instructions (Signed)
?  Sleep apnea is defined as period of 10 seconds or longer when you stop breathing at night. This can happen multiple times a night. Dx sleep apnea is when this occurs more than 5 times an hour.  ?  ?Mild OSA 5-15 apneic events an hour ?Moderate OSA 15-30 apneic events an hour ?Severe OSA > 30 apneic events an hour ?  ?Untreated sleep apnea puts you at higher risk for cardiac arrhythmias, pulmonary HTN, stroke and diabetes ?  ?Treatment options include weight loss, side sleeping position, oral appliance, CPAP therapy or referral to ENT for possible surgical options  ?  ?Recommendations: ?Focus on side sleeping position ?Work on weight loss efforts  ?Do not drive if experiencing excessive daytime sleepiness of fatigue  ?  ?Orders: ?Home sleep study re: snoring  ?  ?Follow-up: ?4-6 week visit to review sleep study results and discuss treatment options further ? ?

## 2021-12-24 NOTE — Assessment & Plan Note (Signed)
-  Patient has symptoms of excessive daytime sleepiness and restless sleep. Epworth 21. Concern patient could have obstructive sleep apnea, needs home sleep study to evaluate. Discussed risk of untreated sleep apnea including cardiac arrhythmias, pulm HTN, stroke, DM. We briefly reviewed treatment options. Encouraged patient to work on weight loss efforts and focus on side sleeping position/elevate head of bed. Advised against driving if experiencing excessive daytime sleepiness. If sleep study is negative for OSA, we will need to review sleep hygiene. Follow-up in 4-6 weeks to review sleep study results and discuss treatment options further.  ? ?

## 2021-12-24 NOTE — Progress Notes (Addendum)
? ?'@Patient'$  ID: Janet [name], female    DOB: 1986/06/17, 36 y.o.   MRN: [id number] ? ?No chief complaint on file. ? ? ?Referring provider: ?[name], MD ? ?HPI: ?36 year old female, never smoked. PMH significant for HTN, cushing syndrome, hyperlipidemia, morbid obesity.  ? ?12/24/2021 ?Patient presents today for sleep consult. She has symptoms of daytime sleepiness, she will notice herself falling asleep at work which is not normal for her. She had sleep study in 2012 that was reportedly negative for sleep apnea. Her weight is up 40-50lbs since last sleep study. She has cushing syndrome. She had right ankle surgery in June of last year and is back in hard boot and needing another surgery. She is getting 4-6 hours of sleep a night. Bedtime is between 9-11pm. She wakes up on average 2-3 times a night. She starts her day around 3:30 and has to be at work by 5:30. She gets home from work around [employer]. She has young children at home. She feels she needs more sleep in general. Denies narcolepsy, cataplexy or sleep walking.  ? ?Sleep questionnaire ?Symptoms-  Daytime sleepiness  ?Prior sleep study- Yes, [facility] in 2012 (results are not available in [place]) ?Bedtime- 9-11pm ?Time to fall asleep- not long  ?Nocturnal awakenings- 2-3 times ?Out of bed/start of day- 3-3:30am  ?Weight changes- 20 lbs  ?Do you operate heavy machinery- No ?Do you currently wear CPAP- No ?Do you current wear oxygen- No  ?Epworth- 21 ? ?Allergies  ?Allergen Reactions  ? Methylprednisolone Shortness Of Breath, Itching and Other (See Comments)  ?  Reaction:  Bruising   ? Penicillins Anaphylaxis, Rash and Other (See Comments)  ?  Has patient had a PCN reaction causing immediate rash, facial/tongue/throat swelling, SOB or lightheadedness with hypotension: No ?Has patient had a PCN reaction causing severe rash involving mucus membranes or skin necrosis: No ?Has patient had a PCN reaction that required hospitalization No ?Has patient had a PCN  reaction occurring within the last 10 years: No ?If all of the above answers are "NO", then may proceed with Cephalosporin use.  ? Latex Rash  ? Neomycin Rash  ?  Breaks out with neosporin  ? ? ?Immunization History  ?Administered Date(s) Administered  ? Influenza Whole 06/23/2010  ? Influenza,inj,Quad PF,6+ Mos 07/09/2013, 07/05/2016, 07/10/2018, 07/10/2019, 06/30/2020, 08/06/2021  ? PFIZER(Purple Top)SARS-COV-2 Vaccination 12/18/2019, 01/15/2020, 11/17/2020  ? PPD Test 11/01/2011  ? Td 10/11/2003  ? Tdap 03/06/2013, 05/06/2016, 03/26/2019  ? ? ?Past Medical History:  ?Diagnosis Date  ? Adrenal tumor   ? Anemia   ? Phreesia 08/12/2020  ? Back pain   ? Borderline diabetes 2011  ? r/t adrenal tumor, now resolved  ? Carpal tunnel syndrome during pregnancy   ? Chest pain   ? 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 on MRI  ? Constipation   ? Cushing syndrome ([place]) 2011  ? r/t adrenal tumor; tumor removed, disease resolved  ? Cushing's syndrome ([place]) 11/27/2013  ? Gallbladder problem   ? Gastroesophageal reflux disease   ? GERD (gastroesophageal reflux disease)   ? Phreesia 08/12/2020  ? Heart murmur   ? Phreesia 08/12/2020  ? Hematuria 09/18/2015  ? Hemorrhoids   ? History of Chiari malformation   ? Hypertension   ? Phreesia 08/12/2020  ? Internal hemorrhoids with other complication 05/20/5725  ? JAN 2015 R ANT MAR 2015 R POS AND L LAT BAND   ? Menstrual periods irregular 09/18/2015  ? Migraine without  aura, with intractable migraine, so stated, without mention of status migrainosus 11/11/2013  ? Palpitations   ? Patient desires pregnancy 11/27/2013  ? Plantar fasciitis   ? Prediabetes   ? Preeclampsia 05/21/2019  ? 2x/wk testing nst alt w/ bpp/dopp      Deliver @ 37wks        IOL scheduled for 8/19 @ 0830  ? Pregnant 12/16/2015  ? SOBOE (shortness of breath on exertion)   ? Swelling of both lower extremities   ? Vitamin D deficiency   ? ? ?Tobacco History: ?Social History  ? ?Tobacco Use  ?Smoking Status Never  ?Smokeless  Tobacco Never  ? ?Counseling given: Not Answered ? ? ?Outpatient Medications Prior to Visit  ?Medication Sig Dispense Refill  ? ergocalciferol (VITAMIN D2) 1.25 MG (50000 UT) capsule 1 po q wed, and 1 po q sun 8 capsule 6  ? pantoprazole (PROTONIX) 40 MG tablet Take one tablet by mouth once daily, as needed, for heartburn 30 tablet 6  ? hydrochlorothiazide (HYDRODIURIL) 25 MG tablet Take 1 tablet (25 mg total) by mouth daily. (Patient not taking: Reported on 12/24/2021) 30 tablet 3  ? meloxicam (MOBIC) 15 MG tablet Take 1 tablet (15 mg total) by mouth daily. (Patient not taking: Reported on 12/24/2021) 30 tablet 0  ? metFORMIN (GLUCOPHAGE) 500 MG tablet Take 1 tablet (500 mg total) by mouth 2 (two) times daily with a meal. (Patient not taking: Reported on 12/24/2021) 60 tablet 3  ? potassium chloride SA (KLOR-CON M) 20 MEQ tablet Take 1 tablet (20 mEq total) by mouth daily. (Patient not taking: Reported on 12/24/2021) 30 tablet 3  ? TRAMADOL HCL PO Take by mouth as needed. (Patient not taking: Reported on 12/24/2021)    ? ?No facility-administered medications prior to visit.  ? ? ?Review of Systems ? ?Review of Systems  ?Constitutional:  Positive for fatigue.  ?HENT: Negative.    ?Respiratory: Negative.    ?Psychiatric/Behavioral:  Positive for sleep disturbance.   ? ? ?Physical Exam ? ?There were no vitals taken for this visit. ?Physical Exam ?Constitutional:   ?   Appearance: Normal appearance.  ?HENT:  ?   Head: Normocephalic and atraumatic.  ?   Mouth/Throat:  ?   Mouth: Mucous membranes are moist.  ?   Pharynx: Oropharynx is clear.  ?   Comments: Mallampati class I ?Cardiovascular:  ?   Rate and Rhythm: Normal rate and regular rhythm.  ?Pulmonary:  ?   Effort: Pulmonary effort is normal.  ?   Breath sounds: Normal breath sounds.  ?Musculoskeletal:     ?   General: Normal range of motion.  ?Skin: ?   General: Skin is warm.  ?Neurological:  ?   General: No focal deficit present.  ?   Mental Status: She is alert and  oriented to person, place, and time. Mental status is at baseline.  ?Psychiatric:     ?   Mood and Affect: Mood normal.     ?   Behavior: Behavior normal.     ?   Thought Content: Thought content normal.     ?   Judgment: Judgment normal.  ?  ? ?Lab Results: ? ?CBC ?   ?Component Value Date/Time  ? WBC 5.0 12/08/2020 1151  ? WBC 6.4 03/20/2020 0703  ? RBC 4.86 12/08/2020 1151  ? RBC 4.52 03/20/2020 0703  ? HGB 12.8 12/08/2020 1151  ? HCT 40.2 12/08/2020 1151  ? PLT 411 12/08/2020 1151  ? MCV  83 12/08/2020 1151  ? MCH 26.3 (L) 12/08/2020 1151  ? MCH 27.4 03/20/2020 0703  ? MCHC 31.8 12/08/2020 1151  ? MCHC 33.2 03/20/2020 0703  ? RDW 13.1 12/08/2020 1151  ? LYMPHSABS 1.7 12/08/2020 1151  ? MONOABS 0.7 12/09/2011 1447  ? EOSABS 0.2 12/08/2020 1151  ? BASOSABS 0.0 12/08/2020 1151  ? ? ?BMET ?   ?Component Value Date/Time  ? NA 140 12/08/2020 1151  ? K 4.2 12/08/2020 1151  ? CL 103 12/08/2020 1151  ? CO2 21 12/08/2020 1151  ? GLUCOSE 90 12/08/2020 1151  ? GLUCOSE 97 03/20/2020 0703  ? BUN 9 12/08/2020 1151  ? CREATININE 0.77 12/08/2020 1151  ? CREATININE 0.71 03/20/2020 0703  ? CALCIUM 9.0 12/08/2020 1151  ? GFRNONAA 111 03/20/2020 0703  ? GFRAA 129 03/20/2020 0703  ? ? ?BNP ?No results found for: BNP ? ?ProBNP ?No results found for: PROBNP ? ?Imaging: ?No results found. ? ? ?Assessment & Plan:  ? ?Excessive daytime sleepiness ?-Patient has symptoms of excessive daytime sleepiness and restless sleep. Epworth 21. Concern patient could have obstructive sleep apnea, needs home sleep study to evaluate. Discussed risk of untreated sleep apnea including cardiac arrhythmias, pulm HTN, stroke, DM. We briefly reviewed treatment options. Encouraged patient to work on weight loss efforts and focus on side sleeping position/elevate head of bed. Advised against driving if experiencing excessive daytime sleepiness. If sleep study is negative for OSA, we will need to review sleep hygiene. Follow-up in 4-6 weeks to review sleep study  results and discuss treatment options further.  ? ? ?Martyn Ehrich, NP ?12/24/2021 ? ?

## 2021-12-27 NOTE — Progress Notes (Signed)
Reviewed and agree with assessment/plan. ? ? ?Chesley Mires, MD ?[place] ?12/27/2021, 8:30 AM ?Pager:  [phone] ? ?

## 2021-12-28 ENCOUNTER — Encounter: Payer: Self-pay | Admitting: Podiatry

## 2021-12-28 NOTE — Progress Notes (Signed)
?Subjective:  ?Patient ID: Janet [name], female    DOB: [birth year],  MRN: [id number] ? ?Chief Complaint  ?Patient presents with  ? Plantar Fasciitis  ?     re-check ankle and heel 3 mth f/u   ? ? ?DOS: 03/12/2021 ?Procedure: Bilateral plantar fascial [place], arthroscopic treatment of osteochondral defect of right ankle ? ?36 y.o. female returns for follow-up, she was able to get into see Dr. Andree Elk at [facility] and has in the preoperative planning stage with a new CT ordered ? ?Review of Systems: Negative except as noted in the HPI. Denies N/V/F/Ch. ? ? ?Objective:  ?There were no vitals filed for this visit. ?There is no height or weight on file to calculate BMI. ?Constitutional Well developed. ?Well nourished.  ?Vascular Foot warm and well perfused. ?Capillary refill normal to all digits.   ?Neurologic Normal speech. ?Oriented to person, place, and time. ?Epicritic sensation to light touch grossly present bilaterally.  ?Dermatologic incisions well-healed and not hypertrophic  ?Orthopedic: She has significant swelling in the right ankle today and pain over the anterior joint line especially medially with range of motion which is limited  ? ?Radiographs: 3 views of the right ankle taken lucency in the talar dome is no longer notable ? ? ?Study Result ? ?Narrative & Impression  ?CLINICAL DATA:  Right ankle pain and swelling for 1-1/2 weeks. ?History of surgery in June 2022. Clinical concern for osteochondral ?lesion. ?  ?EXAM: ?MRI OF THE RIGHT ANKLE WITHOUT CONTRAST ?  ?TECHNIQUE: ?Multiplanar, multisequence MR imaging of the ankle was performed. No ?intravenous contrast was administered. ?  ?COMPARISON:  X-ray ankle 11/15/2021; MR heel 01/17/2021; X-ray foot ?06/11/2020. ?  ?FINDINGS: ?TENDONS ?  ?Peroneal: The peroneus longus and brevis tendons are intact. ?  ?Posteromedial: Intact tibialis posterior, flexor digitorum longus, ?and flexor hallucis longus tendons. ?  ?Anterior: The tibialis  anterior, extensor hallucis longus and ?extensor digitorum longus tendons are intact. ?  ?Achilles: Intact. ?  ?Plantar Fascia: Intact. Resolution of the prior perifascial edema ?around the medial band of the plantar fascia origin. ?  ?LIGAMENTS ?  ?Lateral: The anterior and posterior talofibular, anterior and ?posterior tibiofibular, and calcaneofibular ligaments are intact. ?  ?Medial: The tibiotalar deep deltoid and tibial spring ligaments are ?intact. ?  ?CARTILAGE ?  ?Ankle Joint: There is again an osteochondral defect seen at the far ?medial aspect of the talar dome. This measures up to approximately ?13 mm in AP dimension, 7 mm in transverse dimension, and 7 mm in ?craniocaudal dimension, similar to prior. There is moderate ?surrounding marrow edema. The previously seen mild fluid bright ?cystic change of the deep aspect of the osteochondral defect is no ?longer visualized. Previously there was a decreased T1 and decreased ?T2 signal focus within the superior aspect of the osteochondral ?defect, and this is no longer visualized. This may have been ?surgically removed. Recommend clinical correlation. ?  ?Subtalar Joints/Sinus Tarsi: Fat is preserved within sinus tarsi. ?  ?Bones: Normal marrow signal. No acute fracture. ?  ?Other: The Lisfranc ligament complex is intact. ?  ?There is mild-to-moderate edema and swelling throughout the dorsal ?lateral midfoot and throughout the medial and lateral ankle. ?  ?The tarsal tunnel is unremarkable. ?  ?IMPRESSION: ?Compared to 01/17/2021: ?  ?1. Redemonstration of large medial talar dome osteochondral defect. ?This is similar in size to prior. The prior fluid bright signal at ?the deep aspect is now replaced by marrow edema. Previously there ?was low signal material at  the superior aspect of the osteochondral ?defect that is no longer visualized. This may have been surgically ?resected. Displacement of the bone within the superior aspect of the ?osteochondral defect is  another possibility if this was not ?postsurgical. ?2. Resolution of the prior mild plantar fasciitis. ?  ?  ?Electronically Signed ?  By: Yvonne Kendall M.D. ?  On: 11/18/2021 14:19  ? ?Assessment:  ? ?1. Osteochondral defect of talus   ? ? ? ? ?Plan:  ?Patient was evaluated and treated and all questions answered. ? ?I reviewed Dr. Andree Elk notes and her treatment plan with her and I think she is in good hands and I agree with his proposed plan.  Discussed with her if she needs any further care from me for her plantar fasciitis or any other foot and ankle issues I am happy to treat her here locally, for the ankle she will transition her care to [facility].  She will follow-up with me as needed. ?

## 2022-01-03 ENCOUNTER — Other Ambulatory Visit (HOSPITAL_COMMUNITY): Payer: Self-pay | Admitting: General Surgery

## 2022-01-03 DIAGNOSIS — E249 Cushing's syndrome, unspecified: Secondary | ICD-10-CM

## 2022-01-03 DIAGNOSIS — R7303 Prediabetes: Secondary | ICD-10-CM

## 2022-01-03 DIAGNOSIS — I10 Essential (primary) hypertension: Secondary | ICD-10-CM

## 2022-01-03 DIAGNOSIS — K219 Gastro-esophageal reflux disease without esophagitis: Secondary | ICD-10-CM

## 2022-01-03 DIAGNOSIS — G935 Compression of brain: Secondary | ICD-10-CM

## 2022-01-20 ENCOUNTER — Ambulatory Visit (HOSPITAL_COMMUNITY): Payer: 59

## 2022-01-20 ENCOUNTER — Other Ambulatory Visit (HOSPITAL_COMMUNITY): Payer: 59

## 2022-01-21 ENCOUNTER — Ambulatory Visit: Payer: 59 | Admitting: Family Medicine

## 2022-01-21 ENCOUNTER — Other Ambulatory Visit: Payer: Self-pay

## 2022-01-21 ENCOUNTER — Ambulatory Visit (HOSPITAL_COMMUNITY)
Admission: RE | Admit: 2022-01-21 | Discharge: 2022-01-21 | Disposition: A | Discharge status: Home / Self Care | Payer: 59 | Source: Ambulatory Visit | Attending: General Surgery | Admitting: General Surgery

## 2022-01-21 DIAGNOSIS — R7303 Prediabetes: Secondary | ICD-10-CM | POA: Diagnosis present

## 2022-01-21 DIAGNOSIS — E249 Cushing's syndrome, unspecified: Secondary | ICD-10-CM

## 2022-01-21 DIAGNOSIS — G935 Compression of brain: Secondary | ICD-10-CM

## 2022-01-21 DIAGNOSIS — K219 Gastro-esophageal reflux disease without esophagitis: Secondary | ICD-10-CM

## 2022-01-21 DIAGNOSIS — I10 Essential (primary) hypertension: Secondary | ICD-10-CM | POA: Insufficient documentation

## 2022-02-01 ENCOUNTER — Ambulatory Visit (INDEPENDENT_AMBULATORY_CARE_PROVIDER_SITE_OTHER): Payer: 59 | Admitting: Family Medicine

## 2022-02-01 ENCOUNTER — Encounter: Payer: Self-pay | Admitting: Family Medicine

## 2022-02-01 VITALS — BP 130/98 | HR 90 | Resp 16 | Ht 64.0 in | Wt 250.0 lb

## 2022-02-01 DIAGNOSIS — R7302 Impaired glucose tolerance (oral): Secondary | ICD-10-CM

## 2022-02-01 DIAGNOSIS — I10 Essential (primary) hypertension: Secondary | ICD-10-CM

## 2022-02-01 DIAGNOSIS — M958 Other specified acquired deformities of musculoskeletal system: Secondary | ICD-10-CM | POA: Diagnosis not present

## 2022-02-01 NOTE — Patient Instructions (Addendum)
F/U in 6 weeks, re evaluate weight  and blood pressure ? ?Breakfast boiled egg whites  2  with fresh fruit ? ?Lunch, salad, add egg white/ chicken / fresh fruit, wallnuts ? ?Dinner warm or cold veges with protein ? ?Eat between 4 am and 4 pm ? ?Water 64 oz/ day 4. ? ?We are  heading for improved health, though slow, try to hold on and  when frustrated , believe that behind the clouds the sun always comes out ? ?Thanks for choosing [facility], we consider it a privelige to serve you. ? ? ? ? ?

## 2022-02-04 ENCOUNTER — Ambulatory Visit: Payer: 59

## 2022-02-04 DIAGNOSIS — G4484 Primary exertional headache: Secondary | ICD-10-CM

## 2022-02-04 DIAGNOSIS — G4733 Obstructive sleep apnea (adult) (pediatric): Secondary | ICD-10-CM

## 2022-02-04 DIAGNOSIS — R51 Headache with orthostatic component, not elsewhere classified: Secondary | ICD-10-CM

## 2022-02-04 DIAGNOSIS — H539 Unspecified visual disturbance: Secondary | ICD-10-CM

## 2022-02-04 DIAGNOSIS — G441 Vascular headache, not elsewhere classified: Secondary | ICD-10-CM

## 2022-02-04 DIAGNOSIS — H4710 Unspecified papilledema: Secondary | ICD-10-CM

## 2022-02-04 DIAGNOSIS — R519 Headache, unspecified: Secondary | ICD-10-CM

## 2022-02-04 DIAGNOSIS — G935 Compression of brain: Secondary | ICD-10-CM

## 2022-02-04 DIAGNOSIS — G932 Benign intracranial hypertension: Secondary | ICD-10-CM

## 2022-02-04 DIAGNOSIS — R400 Somnolence: Secondary | ICD-10-CM

## 2022-02-04 DIAGNOSIS — G4719 Other hypersomnia: Secondary | ICD-10-CM

## 2022-02-07 ENCOUNTER — Encounter: Payer: Self-pay | Admitting: Family Medicine

## 2022-02-07 NOTE — Assessment & Plan Note (Signed)
Deteriorated ?Start intermittent fastin and commit to plant based eating ?Chair and upper body execises also discussed, she has sheet with the exercies and will commit ? ?Patient re-educated about  the importance of commitment to a  minimum of 150 minutes of exercise per week as able. ? ?The importance of healthy food choices with portion control discussed, as well as eating regularly and within a 12 hour window most days. ?The need to choose "clean , green" food 50 to 75% of the time is discussed, as well as to make water the primary drink and set a goal of 64 ounces water daily. ? ?  ? ?  02/01/2022  ?  3:38 PM 12/03/2021  ?  1:06 PM 12/03/2021  ? 11:40 AM  ?Weight /BMI  ?Weight 250 lb 240 lb 245 lb 8 oz  ?Height '5\' 4"'$  (1.626 m) '5\' 4"'$  (1.626 m) '5\' 4"'$  (1.626 m)  ?BMI 42.91 kg/m2 41.2 kg/m2 42.14 kg/m2  ? ? ? ?

## 2022-02-07 NOTE — Assessment & Plan Note (Signed)
Patient educated about the importance of limiting  Carbohydrate intake , the need to commit to daily physical activity for a minimum of 30 minutes , and to commit weight loss. ?The fact that changes in all these areas will reduce or eliminate all together the development of diabetes is stressed.  ?Deteriorated , start metformin and follow carb restricted diet ? ? ?  Latest Ref Rng & Units 12/08/2020  ? 11:51 AM 03/20/2020  ?  7:03 AM 10/07/2019  ? 12:23 PM 08/20/2019  ?  4:12 PM 06/06/2019  ?  4:48 AM  ?Diabetic Labs  ?HbA1c 4.8 - 5.6 % 6.2        ?Chol 100 - 199 mg/dL 180   167       ?HDL >39 mg/dL 49   54       ?Calc LDL 0 - 99 mg/dL 118   99       ?Triglycerides 0 - 149 mg/dL 70   55       ?Creatinine 0.57 - 1.00 mg/dL 0.77   0.71   0.80   0.68   0.67    ? ? ?  02/01/2022  ?  4:10 PM 02/01/2022  ?  3:38 PM 12/03/2021  ?  1:06 PM 12/03/2021  ? 11:40 AM 09/30/2021  ?  2:42 PM 08/06/2021  ?  1:07 PM 06/20/2021  ? 12:38 AM  ?BP/Weight  ?Systolic BP 263 785 885 027 150 131 142  ?Diastolic BP 98 88 92 94 92 82 92  ?Wt. (Lbs)  250 240 245.5 242.8 237.08   ?BMI  42.91 kg/m2 41.2 kg/m2 42.14 kg/m2 41.68 kg/m2 40.69 kg/m2   ? ?   ? View : No data to display.  ?  ?  ?  ? ? ? ?

## 2022-02-07 NOTE — Assessment & Plan Note (Signed)
Awaiting graft, still wearing boot and unable to do weight bearing exercise  ?

## 2022-02-07 NOTE — Progress Notes (Signed)
? ?Janet [name]     MRN: [id number]      DOB: 06-28-1986 ? ? ?HPI ?[name] is here for follow up and re-evaluation of chronic medical conditions, medication management and review of any available recent lab and radiology data.  ?Preventive health is updated, specifically  Cancer screening and Immunization.   ?Questions or concerns regarding consultations or procedures which the PT has had in the interim are  addressed. ?Main focus of these visits will address her morbid obesity, associated with co morbidities of hypertension , prediabetes and chronic ankle/ foot pain ?Pt is educatd re intermittent fasting and plant based eating, also the need to commit to 30 mins 5 days weekly of chair exercise ?She is in full agreememmnt ? ?ROS ?Denies recent fever or chills. ?Denies sinus pressure, nasal congestion, ear pain or sore throat. ?Denies chest congestion, productive cough or wheezing. ?Denies chest pains, palpitations and leg swelling ?Denies abdominal pain, nausea, vomiting,diarrhea or constipation.   ?Denies dysuria, frequency, hesitancy or incontinence. ?Chronic right foot/ ankle pain limiting weight bearing exercise ?Denies headaches, seizures, numbness, or tingling. ?C/o  depression and  anxiety due to ongoing weight gain, not suicidal or homicidal in the process of qualifying for bariatric surgery, ahs to wait until able to weight bear. ?Denies skin break down or rash. ? ? ?PE ? ?BP (!) 130/98   Pulse 90   Resp 16   Ht '5\' 4"'$  (1.626 m)   Wt 250 lb (113.4 kg)   SpO2 98%   BMI 42.91 kg/m?  ? ?Patient alert and oriented and in no cardiopulmonary distress. ? ?HEENT: No facial asymmetry, EOMI,     Neck supple . ? ?Chest: Clear to auscultation bilaterally. ? ?CVS: S1, S2 no murmurs, no S3.Regular rate. ? ?ABD: Soft non tender.  ? ?Ext: No edema ? ?MS: Adequate ROM spine, shoulders, hips and knees. ? ?Skin: Intact, no ulcerations or rash noted. ? ?Psych: Good eye contact, normal affect. Memory intact not  anxious or depressed appearing. ? ?CNS: CN 2-12 intact, power,  normal throughout.no focal deficits noted. ? ? ?Assessment & Plan ? ?Hypertension ?DASH diet and commitment to daily physical activity for a minimum of 30 minutes discussed and encouraged, as a part of hypertension management. ?The importance of attaining a healthy weight is also discussed. ? ? ?  02/01/2022  ?  4:10 PM 02/01/2022  ?  3:38 PM 12/03/2021  ?  1:06 PM 12/03/2021  ? 11:40 AM 09/30/2021  ?  2:42 PM 08/06/2021  ?  1:07 PM 06/20/2021  ? 12:38 AM  ?BP/Weight  ?Systolic BP 702 637 858 850 150 131 142  ?Diastolic BP 98 88 92 94 92 82 92  ?Wt. (Lbs)  250 240 245.5 242.8 237.08   ?BMI  42.91 kg/m2 41.2 kg/m2 42.14 kg/m2 41.68 kg/m2 40.69 kg/m2   ? ?Uncontrolled, med compliance stressed, no change in meds at this visit ? ? ? ?Glucose intolerance (impaired glucose tolerance) ?Patient educated about the importance of limiting  Carbohydrate intake , the need to commit to daily physical activity for a minimum of 30 minutes , and to commit weight loss. ?The fact that changes in all these areas will reduce or eliminate all together the development of diabetes is stressed.  ?Deteriorated , start metformin and follow carb restricted diet ? ? ?  Latest Ref Rng & Units 12/08/2020  ? 11:51 AM 03/20/2020  ?  7:03 AM 10/07/2019  ? 12:23 PM 08/20/2019  ?  4:12 PM  06/06/2019  ?  4:48 AM  ?Diabetic Labs  ?HbA1c 4.8 - 5.6 % 6.2        ?Chol 100 - 199 mg/dL 180   167       ?HDL >39 mg/dL 49   54       ?Calc LDL 0 - 99 mg/dL 118   99       ?Triglycerides 0 - 149 mg/dL 70   55       ?Creatinine 0.57 - 1.00 mg/dL 0.77   0.71   0.80   0.68   0.67    ? ? ?  02/01/2022  ?  4:10 PM 02/01/2022  ?  3:38 PM 12/03/2021  ?  1:06 PM 12/03/2021  ? 11:40 AM 09/30/2021  ?  2:42 PM 08/06/2021  ?  1:07 PM 06/20/2021  ? 12:38 AM  ?BP/Weight  ?Systolic BP 355 732 202 542 150 131 142  ?Diastolic BP 98 88 92 94 92 82 92  ?Wt. (Lbs)  250 240 245.5 242.8 237.08   ?BMI  42.91 kg/m2 41.2 kg/m2 42.14 kg/m2  41.68 kg/m2 40.69 kg/m2   ? ?   ? View : No data to display.  ?  ?  ?  ? ? ? ? ?Morbid obesity ([place]) ?Deteriorated ?Start intermittent fastin and commit to plant based eating ?Chair and upper body execises also discussed, she has sheet with the exercies and will commit ? ?Patient re-educated about  the importance of commitment to a  minimum of 150 minutes of exercise per week as able. ? ?The importance of healthy food choices with portion control discussed, as well as eating regularly and within a 12 hour window most days. ?The need to choose "clean , green" food 50 to 75% of the time is discussed, as well as to make water the primary drink and set a goal of 64 ounces water daily. ? ?  ? ?  02/01/2022  ?  3:38 PM 12/03/2021  ?  1:06 PM 12/03/2021  ? 11:40 AM  ?Weight /BMI  ?Weight 250 lb 240 lb 245 lb 8 oz  ?Height '5\' 4"'$  (1.626 m) '5\' 4"'$  (1.626 m) '5\' 4"'$  (1.626 m)  ?BMI 42.91 kg/m2 41.2 kg/m2 42.14 kg/m2  ? ? ? ? ?Osteochondral defect of talus ?Awaiting graft, still wearing boot and unable to do weight bearing exercise  ? ?

## 2022-02-07 NOTE — Assessment & Plan Note (Signed)
DASH diet and commitment to daily physical activity for a minimum of 30 minutes discussed and encouraged, as a part of hypertension management. ?The importance of attaining a healthy weight is also discussed. ? ? ?  02/01/2022  ?  4:10 PM 02/01/2022  ?  3:38 PM 12/03/2021  ?  1:06 PM 12/03/2021  ? 11:40 AM 09/30/2021  ?  2:42 PM 08/06/2021  ?  1:07 PM 06/20/2021  ? 12:38 AM  ?BP/Weight  ?Systolic BP 820 813 887 195 150 131 142  ?Diastolic BP 98 88 92 94 92 82 92  ?Wt. (Lbs)  250 240 245.5 242.8 237.08   ?BMI  42.91 kg/m2 41.2 kg/m2 42.14 kg/m2 41.68 kg/m2 40.69 kg/m2   ? ?Uncontrolled, med compliance stressed, no change in meds at this visit ? ? ?

## 2022-02-08 DIAGNOSIS — G4733 Obstructive sleep apnea (adult) (pediatric): Secondary | ICD-10-CM

## 2022-02-17 ENCOUNTER — Ambulatory Visit: Payer: 59 | Admitting: Skilled Nursing Facility1

## 2022-02-17 ENCOUNTER — Telehealth: Payer: Self-pay | Admitting: Primary Care

## 2022-02-17 NOTE — Telephone Encounter (Signed)
Beth, please advise on results of pt's HST. ?

## 2022-02-18 NOTE — Telephone Encounter (Signed)
Sleep Study has been read but not scanned into chart yet ?

## 2022-02-18 NOTE — Telephone Encounter (Signed)
I do not see that patient had sleep study, results are not in chart ?When was it done?  ?

## 2022-02-18 NOTE — Telephone Encounter (Signed)
I called the patient and let her know that we are waiting on the results to be scanned into her chart. She voices understanding. ?

## 2022-02-18 NOTE — Telephone Encounter (Signed)
Ill have to wait for results to be uploaded, may take a few more business days. If she has not heard back from [place] by next Friday please contact us

## 2022-02-24 ENCOUNTER — Encounter: Payer: Self-pay | Admitting: *Deleted

## 2022-02-28 ENCOUNTER — Encounter: Payer: Self-pay | Admitting: Neurology

## 2022-02-28 ENCOUNTER — Ambulatory Visit: Payer: 59 | Admitting: Neurology

## 2022-02-28 VITALS — BP 129/86 | HR 77 | Ht 64.0 in | Wt 251.0 lb

## 2022-02-28 DIAGNOSIS — G441 Vascular headache, not elsewhere classified: Secondary | ICD-10-CM | POA: Diagnosis not present

## 2022-02-28 DIAGNOSIS — G4484 Primary exertional headache: Secondary | ICD-10-CM

## 2022-02-28 DIAGNOSIS — R51 Headache with orthostatic component, not elsewhere classified: Secondary | ICD-10-CM

## 2022-02-28 DIAGNOSIS — G932 Benign intracranial hypertension: Secondary | ICD-10-CM | POA: Diagnosis not present

## 2022-02-28 DIAGNOSIS — H4710 Unspecified papilledema: Secondary | ICD-10-CM

## 2022-02-28 DIAGNOSIS — H539 Unspecified visual disturbance: Secondary | ICD-10-CM

## 2022-02-28 DIAGNOSIS — G935 Compression of brain: Secondary | ICD-10-CM

## 2022-02-28 DIAGNOSIS — R519 Headache, unspecified: Secondary | ICD-10-CM

## 2022-02-28 MED ORDER — ACETAZOLAMIDE 125 MG PO TABS: 125.0000 mg | ORAL_TABLET | Freq: Two times a day (BID) | ORAL | 6 refills | Status: DC

## 2022-02-28 NOTE — Progress Notes (Unsigned)
Reason for visit: Headache, Arnold-Chiari malformation  Janet [name] is an 36 y.o. 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 She has not seen an ophthology. Headaches feel like behind most eye, pressure in the back of the head, hurts when she sneeezes, neck pain, pressure, also has migrainous headaches and hurts with bearing down. She has has also had migraines since childhood, these are different, she has light and sound sensitivity, frontal area, tried topiramate in the past. Has tried Topiramate in 2022 with Dr. WJannifer Franklinand in 2014/2015 with Dr. SMoshe CiproNo episodes of blurry vision. Daily headaches, ongoing for years, now more pressure than migraine and getting more intense.   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  She is having some form of headache on a daily basis.  She in the past has a history of Arnold-Chiari malformation, she has reported pressure sensations coming of the back of the head with coughing or sneezing or straining, she may see black spots in front of the eyes at times.  She also has what sounds like 2 migraine headaches with photophobia and phonophobia and some nausea.  These may occur 2 or 3 times a month, but she is having some form of headache on a daily basis.  Occasionally when she turns her head, she may get a headache.  Again this is brief in nature.  The patient recently had right ankle surgery, she is recovering from this, not fully weightbearing yet.  She comes back here for an evaluation.  She was placed on Topamax previously but could not tolerate the drug and went off the medication.  She may take Fioricet if needed for the headache.  Past Medical History:  Diagnosis Date   Adrenal tumor    Anemia     Phreesia 08/12/2020   Back pain    Borderline diabetes 2011   r/t adrenal tumor, now resolved   Carpal tunnel syndrome during pregnancy    Chest pain    [place]   [place]   Constipation    [place]   r/t adrenal tumor; tumor removed, disease resolved   [place]   Gallbladder problem    Gastroesophageal reflux disease    GERD (gastroesophageal reflux disease)    Phreesia 08/12/2020   Headache    Heart murmur    Phreesia 08/12/2020   Hematuria 09/18/2015   Hemorrhoids    History of Chiari malformation    Hypertension    Phreesia 08/12/2020   Internal hemorrhoids with other complication [id number]  JAN 2015 R ANT MAR 2015 R POS AND L LAT BAND    Menstrual periods irregular 09/18/2015   Migraine without aura, with intractable migraine, so stated, without mention of status migrainosus 11/11/2013   Palpitations    Patient desires pregnancy 11/27/2013   Plantar fasciitis    Prediabetes    Preeclampsia 05/21/2019   2x/wk testing nst alt w/ bpp/dopp      Deliver @ 37wks        IOL scheduled for 8/19 @ 0830   Pregnant 12/16/2015   SOBOE (shortness of breath on exertion)    Swelling of both lower extremities    Vitamin D deficiency  Past Surgical History:  Procedure Laterality Date   arthroscopic treatment of osteochondral defect of right ankle  03/2021   Bilateral plantar fascial [place]  03/2021   CHOLECYSTECTOMY  02/2015   COLONOSCOPY N/A 04/19/2013   EPP:IRJJOA mucosa in the terminal ileum/Single erosion in transverse colon/RECTAL BLEEDING DUE TO Moderate sized internal hemorrhoids/ trv colon erosion, bx benign.   COLONOSCOPY WITH PROPOFOL N/A 08/31/2020   Non-bleeding internal hemorrhoids. Colon torturous.    ESOPHAGOGASTRODUODENOSCOPY N/A 04/19/2013   CZY:SAYT Non-erosive gastritis/PERI-UMBILICAL PAIN DUE TO GERD, GASTRITIS, AND CONSTIPATION. Bx with mild chronic inactive gastritis. No  H.Pylori   HEMORRHOID BANDING  2015   Dr. Gala Romney- in office banding procedure   LAPAROSCOPIC ADRENALECTOMY  10/28/2010   TONSILLECTOMY     tumor removed left adrenal gland 01/12      Family History  Problem Relation Age of Onset   Hypertension Mother    Hyperlipidemia Mother    Colon polyps Mother    Hypertension Father    Colon polyps Father    Sleep apnea Father    Obesity Father    Deep vein thrombosis Sister    Hypertension Maternal Grandmother    Diabetes Maternal Grandmother    Deep vein thrombosis Maternal Grandmother    Diabetes Paternal Grandmother    COPD Paternal Grandfather    Heart disease Paternal Grandfather    Other Daughter        pulmonary valve stenosis   Stroke Maternal Aunt    Migraines Maternal Aunt    Colon cancer Other        maternal great uncle    Social history:  reports that she has never smoked. She has never used smokeless tobacco. She reports that she does not drink alcohol and does not use drugs.    Allergies  Allergen Reactions   Methylprednisolone Shortness Of Breath, Itching and Other (See Comments)    Reaction:  Bruising    Penicillins Anaphylaxis, Rash and Other (See Comments)    Has patient had a PCN reaction causing immediate rash, facial/tongue/throat swelling, SOB or lightheadedness with hypotension: No Has patient had a PCN reaction causing severe rash involving mucus membranes or skin necrosis: No Has patient had a PCN reaction that required hospitalization No Has patient had a PCN reaction occurring within the last 10 years: No If all of the above answers are "NO", then may proceed with Cephalosporin use.   Latex Rash   Neomycin Rash    Breaks out with neosporin    Medications:  Prior to Admission medications   Medication Sig Start Date End Date Taking? Authorizing Provider  ergocalciferol (VITAMIN D2) 1.25 MG (50000 UT) capsule 1 po q wed, and 1 po q sun 01/05/21  Yes Opalski, Deborah, DO  Multiple Vitamin (MULTIVITAMIN)  tablet Take 1 tablet by mouth daily.   Yes [provider]  pantoprazole (PROTONIX) 40 MG tablet Take one tablet by mouth once daily, as needed, for heartburn Patient taking differently: Take 40 mg by mouth daily. 06/30/20  Yes [name], MD  butalbital-acetaminophen-caffeine (FIORICET) [id number] MG tablet Take one tablet up to two times daily , as needed, for severe headache Patient not taking: Reported on 04/26/2021 08/13/20   [name], MD    ROS:  Out of a complete 14 system review of symptoms, the patient complains only of the following symptoms, and all other reviewed systems are negative.  Headache Right foot discomfort  Blood pressure 129/86, pulse 77, height '5\' 4"'$  (1.626 m), weight  251 lb (113.9 kg).  Physical Exam  General: The patient is alert and cooperative at the time of the examination.  The patient is moderately obese.  Skin: No significant peripheral edema is noted.  The right lower leg is in a brace.   Neurologic Exam  Mental status: The patient is alert and oriented x 3 at the time of the examination. The patient has apparent normal recent and remote memory, with an apparently normal attention span and concentration ability.   Cranial nerves: Facial symmetry is present. Speech is normal, no aphasia or dysarthria is noted. Extraocular movements are full. Visual fields are full.  Motor: The patient has good strength in all 4 extremities.  Sensory examination: Soft touch sensation is symmetric on the face, arms, and legs.  Coordination: The patient has good finger-nose-finger and heel-to-shin bilaterally.  Gait and station: The patient was not ambulated, she is not fully weightbearing on the right leg.  Reflexes: Deep tendon reflexes are symmetric.   Assessment/Plan:  1.  Migraine headache  2.  Arnold-Chiari malformation  MRI brain and MRV of the head.  Dr. Zada Finders  Diamox    The patient likely has both true migraine as  well as headaches that may be related to the Arnold-Chiari malformation.  MRI of the brain will be done to compare to a prior study done 5 years ago looking for progression of the Arnold-Chiari.  The patient will be placed on propranolol 20 mg twice daily, she will call for any dose adjustments.  She will follow-up here in 3 months.  Jill Alexanders MD 02/28/2022 8:19 AM  [facility] [address] [place], [place] [address]  Phone [phone] Fax [phone]

## 2022-02-28 NOTE — Patient Instructions (Addendum)
MRI of the brain and MRV Dr. 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 The cerebellum is important for balance, and the brain stem is important for basic body functions, such as breathing and swallowing. Normally, the cerebellum is located in a space at the back of the skull, just above the opening in the skull (foramen magnum)where the spinal cord meets the brain stem. With CM, part of the cerebellum is located below the foramen magnum instead. The malformation can be mild with no or few symptoms, or it can be severe. CM can cause neck pain, headaches, balance problems, and other symptoms. What are the causes? CM is a condition that a person is born with (congenital). In rare cases, CM may also develop later in life, which is called acquired CM or secondary CM. These cases may be caused by a leak of the fluid that surrounds and protects the brain and spinal cord (cerebrospinal fluid), leading to low pressure. In acquired or secondary CM, abnormal pressure develops in the brain. This pushes the cerebellum down into the foramen magnum. What increases the risk? The following factors may make you more likely to develop this condition: Being female. Having a family history of CM. What are the signs or symptoms? Symptoms of this condition may vary depending on the severity of your CM. In some cases, there are no symptoms. In other cases, symptoms may come and go. The most common symptom is a severe headache in the back of the head. The headache: May come and go. May spread to your neck and shoulders. May be worse when you cough, sneeze, or  strain. Other symptoms include: Difficulty balancing or loss of coordination. Vision problems, such as double vision, tiny spots moving across your vision (floaters), or sensitivity to lights. Trouble swallowing or speaking or hearing. Feeling dizzy or fainting. Breathing problems, including breathing pauses during sleep (sleep apnea). Curved back (scoliosis). Inability to control when you urinate (incontinence). How is this diagnosed? This condition may be evaluated with a medical history and physical exam. This may include tests to check your balance and nerves (neurological exam). You may also have imaging tests, such as a CT scan or an MRI. How is this treated? Treatment for this condition depends on the severity of your symptoms. You may be treated with: Surgery to prevent the malformation from getting worse, or to treat severe symptoms or symptoms that are getting worse. Medicines or alternative treatments to relieve headaches or neck pain. If you do not have symptoms, you may not need treatment. Follow these instructions at home: Medicines Take over-the-counter and prescription medicines only as told by your health care provider. Ask your health care provider if the medicine prescribed to you requires you to avoid driving or using machinery. General instructions If you feel like you might faint: Lie down right away and raise (elevate) your feet above the level of your heart. Breathe deeply and steadily. Wait until all of the symptoms have passed. If you have problems with dizziness, get up slowly when lying down. Take several minutes to sit and then stand. Ask your health care provider which activities are safe for you and  if you have any activity restrictions. Do not use any products that contain nicotine or tobacco. These products include cigarettes, chewing tobacco, and vaping devices, such as e-cigarettes. If you need help quitting, ask your health care provider. Drink enough  fluid to keep your urine pale yellow. Consider joining a CM support group. Keep all follow-up visits. This is important. Where to find more information [employer] of Neurological Disorders and Stroke: [employer] Contact a health care provider if: You have new symptoms. Your symptoms get worse. Get help right away if: You develop weakness or numbness in one or more of your limbs. You develop dizziness, slurred speech, double vision, weakness, or numbness with a severe headache. These symptoms may represent a serious problem that is an emergency. Do not wait to see if the symptoms will go away. Get medical help right away. Call your local emergency services (911 in the [place] Summary A Chiari malformation is a condition in which part of the cerebellum moves down through the foramen magnum. The malformation can be mild with no symptoms, or it can be severe. In some cases, no treatment is needed. In others, medicines are used to treat headaches. Surgery is done in the worst of cases. This information is not intended to replace advice given to you by your health care provider. Make sure you discuss any questions you have with your health care provider. Document Revised: 12/22/2020 Document Reviewed: 12/22/2020 Elsevier Patient Education  [place].   Idiopathic Intracranial Hypertension  Idiopathic intracranial hypertension (IIH) is a condition that increases pressure around the brain. The fluid that surrounds the brain and spinal cord (cerebrospinal fluid, or CSF) increases and causes the pressure. Idiopathic means that the cause of this condition is not known. IIH affects the brain and spinal cord (neurological disorder). If this condition is not treated, it can cause vision loss or blindness. What are the causes? The cause of this condition is not known. What increases the risk? The following factors may make you more likely to develop this condition: Being very  overweight (obese). Being a female between the ages of 58 and 63 years old, who has not gone through menopause. Taking certain medicines, such as birth control or steroids. What are the signs or symptoms? Symptoms of this condition include: Headaches. This is the most common symptom. Brief episodes of total blindness. Double vision, blurred vision, or poor side (peripheral) vision. Pain in the shoulders or neck. Nausea and vomiting. A sound like rushing water or a pulsing sound within the ears (pulsatile tinnitus), or ringing in the ears. How is this diagnosed? This condition may be diagnosed based on: Your symptoms and medical history. Imaging tests of the brain, such as: CT scan. MRI. Magnetic resonance venogram (MRV) to check the veins. Diagnostic lumbar puncture. This is a procedure to remove and examine a sample of cerebrospinal fluid. This procedure can determine whether too much fluid may be causing IIH. A thorough eye exam to check for swelling or nerve damage in the eyes. How is this treated? Treatment for this condition depends on the symptoms. The goal of treatment is to decrease the pressure around your brain. Common treatments include: Weight loss through healthy eating, salt restriction, and exercise, if you are overweight. Medicines to decrease the production of spinal fluid and lower the pressure within your skull. Medicines to prevent or treat headaches. Other treatments may include: Surgery to place drains (shunts) in your brain for removing excess fluid. Lumbar puncture to remove excess  cerebrospinal fluid. Follow these instructions at home: If you are overweight or obese, work with your health care provider to lose weight. Take over-the-counter and prescription medicines only as told by your health care provider. Ask your health care provider if the medicine prescribed to you requires you to avoid driving or using machinery. Do not use any products that contain  nicotine or tobacco, such as cigarettes, e-cigarettes, and chewing tobacco. If you need help quitting, ask your health care provider. Keep all follow-up visits as told by your health care provider. This is important. Contact a health care provider if: You have changes in your vision, such as: Double vision. Blurred vision. Poor peripheral vision. Get help right away if: You have any of the following symptoms and they get worse or do not get better: Headaches. Nausea. Vomiting. Sudden trouble seeing. Summary Idiopathic intracranial hypertension (IIH) is a condition that increases pressure around the brain. The cause is not known (is idiopathic). The most common symptom of IIH is headaches. Vision changes, pain in the shoulders or neck, nausea, and vomiting may also occur. Treatment for this condition depends on your symptoms. The goal of treatment is to decrease the pressure around your brain. If you are overweight or obese, work with your health care provider to lose weight. Take over-the-counter and prescription medicines only as told by your health care provider. This information is not intended to replace advice given to you by your health care provider. Make sure you discuss any questions you have with your health care provider. Document Revised: 09/07/2019 Document Reviewed: 09/07/2019 Elsevier Patient Education  [place].  Acetazolamide Tablets What is this medication? ACETAZOLAMIDE (a set a ZOLE a mide) reduces swelling related to heart disease. It may also be used to reduce swelling caused by medications. It helps your kidneys remove more fluid and salt from your blood through the urine. It may also be used to treat conditions with increased pressure of the eye, such as glaucoma. It can be used with other medications to prevent and control seizures in people with epilepsy. It can also be used to prevent or treat symptoms of altitude sickness. It works by increasing the  amount of oxygen in your body. It belongs to a group of medications called diuretics. This medicine may be used for other purposes; ask your health care provider or pharmacist if you have questions. COMMON BRAND NAME(S): Diamox What should I tell my care team before I take this medication? They need to know if you have any of these conditions: Glaucoma Kidney disease Liver disease Low adrenal gland function Lung or breathing disease An unusual or allergic reaction to acetazolamide, other medications, foods, dyes, or preservatives Pregnant or trying to get pregnant Breast-feeding How should I use this medication? Take this medication by mouth. Take it as directed on the prescription label at the same time every day. You can take it with or without food. If it upsets your stomach, take it with food. Keep taking it unless your care team tells you to stop. Talk to your care team about the use of this medication in children. Special care may be needed. Overdosage: If you think you have taken too much of this medicine contact a poison control center or emergency room at once. NOTE: This medicine is only for you. Do not share this medicine with others. What if I miss a dose? If you miss a dose, take it as soon as you can. If it is almost time for  your next dose, take only that dose. Do not take double or extra doses. What may interact with this medication? Do not take this medication with any of the following: Methazolamide This medication may also interact with the following: Aspirin and aspirin-like medications Cyclosporine Lithium Medication for diabetes Methenamine Other diuretics Phenytoin Primidone Quinidine Sodium bicarbonate Stimulant medications, such as dextroamphetamine This list may not describe all possible interactions. Give your health care provider a list of all the medicines, herbs, non-prescription drugs, or dietary supplements you use. Also tell them if you smoke, drink  alcohol, or use illegal drugs. Some items may interact with your medicine. What should I watch for while using this medication? Visit your care team for regular checks on your progress. Tell your care team if your symptoms do not start to get better or if they get worse. This medication may cause serious skin reactions. They can happen weeks to months after starting the medication. Contact your care team right away if you notice fevers or flu-like symptoms with a rash. The rash may be red or purple and then turn into blisters or peeling of the skin. Or, you might notice a red rash with swelling of the face, lips, or lymph nodes in your neck or under your arms. This medication may affect your coordination, reaction time, or judgment. Do not drive or operate machinery until you know how this medication affects you. Sit up or stand slowly to reduce the risk of dizzy or fainting spells. What side effects may I notice from receiving this medication? Side effects that you should report to your care team as soon as possible: Allergic reactions--skin rash, itching, hives, swelling of the face, lips, tongue, or throat Aplastic anemia--unusual weakness or fatigue, dizziness, headache, trouble breathing, increased bleeding or bruising High acid level--trouble breathing, unusual weakness or fatigue, confusion, headache, fast or irregular heartbeat, nausea, vomiting Infection--fever, chills, cough, or sore throat Kidney stones--blood in the urine, pain or trouble passing urine, pain in the lower back or sides Liver injury--right upper belly pain, loss of appetite, nausea, light-colored stool, dark yellow or brown urine, yellowing skin or eyes, unusual weakness or fatigue Low potassium level--muscle pain or cramps, unusual weakness or fatigue, fast or irregular heartbeat, constipation Redness, blistering, peeling, or loosening of the skin, including inside the mouth Side effects that usually do not require medical  attention (report to your care team if they continue or are bothersome): Blurry vision Change in taste Loss of appetite Pain, tingling, or numbness in the hands or feet This list may not describe all possible side effects. Call your doctor for medical advice about side effects. You may report side effects to FDA at 1-800-FDA-1088. Where should I keep my medication? Keep out of the reach of children and pets. Store at room temperature between 20 and 25 degrees C (68 and 77 degrees F). Throw away any unused medication after the expiration date. NOTE: This sheet is a summary. It may not cover all possible information. If you have questions about this medicine, talk to your doctor, pharmacist, or health care provider.  2023 Elsevier/Gold Standard (2021-11-09 00:00:00)

## 2022-03-01 ENCOUNTER — Telehealth: Payer: Self-pay | Admitting: Primary Care

## 2022-03-01 ENCOUNTER — Telehealth: Payer: Self-pay | Admitting: Neurology

## 2022-03-01 ENCOUNTER — Encounter: Payer: Self-pay | Admitting: Neurology

## 2022-03-01 DIAGNOSIS — G932 Benign intracranial hypertension: Secondary | ICD-10-CM | POA: Insufficient documentation

## 2022-03-01 DIAGNOSIS — G4719 Other hypersomnia: Secondary | ICD-10-CM

## 2022-03-01 NOTE — Telephone Encounter (Signed)
Called patient and she states that she would like the results of her home sleep test that was done in [place].  Please advise [employer]

## 2022-03-01 NOTE — Telephone Encounter (Signed)
[facility] team- Have results been uploaded, I do not see them in chart

## 2022-03-01 NOTE — Telephone Encounter (Signed)
[employer] sent to GI they obtain auth and call patient to schedule

## 2022-03-01 NOTE — Telephone Encounter (Signed)
-----   Message from Donne Hazel sent at 03/01/2022 10:04 AM EDT ----- Regarding: need new HST order Hi Barnet Pall can you please add a HST order for this patient  Her 2023 results was scan on her old order  Eustaquio Maize W placed her order thanks

## 2022-03-01 NOTE — Telephone Encounter (Signed)
Referral for Neurosurgery sent to [facility] [phone]. ?

## 2022-03-01 NOTE — Telephone Encounter (Signed)
Orders placed for this patient.

## 2022-03-01 NOTE — Telephone Encounter (Signed)
The results are in there but its all messed up so Laren Everts is working on getting it fixed.

## 2022-03-04 ENCOUNTER — Ambulatory Visit: Payer: 59 | Admitting: Primary Care

## 2022-03-06 ENCOUNTER — Encounter: Payer: Self-pay | Admitting: Neurology

## 2022-03-08 ENCOUNTER — Telehealth: Payer: Self-pay | Admitting: Family Medicine

## 2022-03-08 ENCOUNTER — Other Ambulatory Visit: Payer: Self-pay | Admitting: Neurology

## 2022-03-08 MED ORDER — ALPRAZOLAM 0.25 MG PO TABS: ORAL_TABLET | ORAL | 0 refills | Status: DC

## 2022-03-08 NOTE — Telephone Encounter (Signed)
Pt wants to talk with you regarding an appointment.  I tried to do this for her and she insisted she needs to talk with you.

## 2022-03-09 ENCOUNTER — Telehealth: Payer: Self-pay | Admitting: Family Medicine

## 2022-03-09 NOTE — Telephone Encounter (Signed)
Patient still not answering- see duplicate message

## 2022-03-09 NOTE — Telephone Encounter (Signed)
Called patient and left message for them to return call at the office   

## 2022-03-09 NOTE — Telephone Encounter (Signed)
Pt wants to know if you can please call her when you get a chance. She was asking about if Dr. Moshe Cipro was taking new patients.

## 2022-03-09 NOTE — Telephone Encounter (Signed)
Called pt left voicemail. See duplicate message

## 2022-03-11 ENCOUNTER — Encounter: Payer: Self-pay | Admitting: Primary Care

## 2022-03-11 ENCOUNTER — Ambulatory Visit (INDEPENDENT_AMBULATORY_CARE_PROVIDER_SITE_OTHER): Payer: 59 | Admitting: Primary Care

## 2022-03-11 VITALS — BP 120/72 | HR 85 | Temp 98.1°F | Ht 64.0 in | Wt 254.1 lb

## 2022-03-11 DIAGNOSIS — G4730 Sleep apnea, unspecified: Secondary | ICD-10-CM

## 2022-03-11 NOTE — Assessment & Plan Note (Signed)
-   Patient has symptoms of excessive daytime sleepiness.  Epworth score 21/24.  BMI 43.  Home sleep study on 02/06/2022 showed mild obstructive sleep apnea, AHI 6.1/hr with SPO2 low 88%.  Reviewed sleep study results and treatment options.  Due to severity of fatigue symptoms recommending either oral appliance or CPAP therapy.  She will be started on auto CPAP 5 to 15 cm H2O with mask of choice.  Advised she aim to wear CPAP every night for minimum 4 to 6 hours.  She is motivated to work on weight loss.  Recommend she focus on side sleeping position or elevate head of bed at night.  Advised against driving experiencing excessive daytime sleepiness or fatigue.  Follow-up 31 to 90 days after starting CPAP for compliance check.

## 2022-03-11 NOTE — Progress Notes (Signed)
$'@Patient'q$  ID: Janet [name], female    DOB: Sep 29, 1986, 36 y.o.   MRN: [id number]  No chief complaint on file.   Referring provider: [name], MD  HPI: 36 year old female, never smoked. PMH significant for HTN, cushing syndrome, hyperlipidemia, morbid obesity.   Previous [facility] pulmonary encounter: 12/24/2021 Patient presents today for sleep consult. She has symptoms of daytime sleepiness, she will notice herself falling asleep at work which is not normal for her. She had sleep study in 2012 that was reportedly negative for sleep apnea. Her weight is up 40-50lbs since last sleep study. She has cushing syndrome. She had right ankle surgery in June of last year and is back in hard boot and needing another surgery. She is getting 4-6 hours of sleep a night. Bedtime is between 9-11pm. She wakes up on average 2-3 times a night. She starts her day around 3:30 and has to be at work by 5:30. She gets home from work around [employer]. She has young children at home. She feels she needs more sleep in general. Denies narcolepsy, cataplexy or sleep walking.   Sleep questionnaire Symptoms-  Daytime sleepiness  Prior sleep study- Yes, [facility] in 2012 (results are not available in MyChart) Bedtime- 9-11pm Time to fall asleep- not long  Nocturnal awakenings- 2-3 times Out of bed/start of day- 3-3:30am  Weight changes- 20 lbs  Do you operate heavy machinery- No Do you currently wear CPAP- No Do you current wear oxygen- No  Epworth- 21  03/11/2022- Interim hx  Patient presents today to review sleep study results.  Patient has symptoms of excessive daytime sleepiness. She reports falling asleep at work which is not normal for her.  Her weight is up 40 to 50 pounds since her last sleep study in 2012 which was negative. She had a home sleep study on 02/06/2022 showed evidence of mild obstructive sleep apnea, AHI 6.1/hr SPO2 low 88% (average 94%).  She had a total of 14 apneic events and 25 hypopneic events.  We  reviewed sleep study results, risks of untreated sleep apnea and treatment options.  Recommending due to severity of her daytime sleepiness patient either consider oral appliance or CPAP therapy.  Patient will be started on auto CPAP 5 to 15 cm H2O with mask of choice.  She is motivated to work on weight loss.  Allergies  Allergen Reactions   Methylprednisolone Shortness Of Breath, Itching and Other (See Comments)    Reaction:  Bruising    Penicillins Anaphylaxis, Rash and Other (See Comments)    Has patient had a PCN reaction causing immediate rash, facial/tongue/throat swelling, SOB or lightheadedness with hypotension: No Has patient had a PCN reaction causing severe rash involving mucus membranes or skin necrosis: No Has patient had a PCN reaction that required hospitalization No Has patient had a PCN reaction occurring within the last 10 years: No If all of the above answers are "NO", then may proceed with Cephalosporin use.   Latex Rash   Neomycin Rash    Breaks out with neosporin    Immunization History  Administered Date(s) Administered   Influenza Whole 06/23/2010   Influenza,inj,Quad PF,6+ Mos 07/09/2013, 07/05/2016, 07/10/2018, 07/10/2019, 06/30/2020, 08/06/2021   PFIZER(Purple Top)SARS-COV-2 Vaccination 12/18/2019, 01/15/2020, 11/17/2020   PPD Test 11/01/2011   Td 10/11/2003   Tdap 03/06/2013, 05/06/2016, 03/26/2019    Past Medical History:  Diagnosis Date   Adrenal tumor    Anemia    Phreesia 08/12/2020   Back pain  Borderline diabetes 2011   r/t adrenal tumor, now resolved   Carpal tunnel syndrome during pregnancy    Chest pain    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   on MRI   Constipation    Cushing syndrome ([place]) 2011   r/t adrenal tumor; tumor removed, disease resolved   Cushing's syndrome ([place]) 11/27/2013   Gallbladder problem    Gastroesophageal reflux disease    GERD (gastroesophageal reflux disease)    Phreesia 08/12/2020   Headache    Heart  murmur    Phreesia 08/12/2020   Hematuria 09/18/2015   Hemorrhoids    History of Chiari malformation    Hypertension    Phreesia 08/12/2020   Internal hemorrhoids with other complication 60/73/7106   JAN 2015 R ANT MAR 2015 R POS AND L LAT BAND    Menstrual periods irregular 09/18/2015   Migraine without aura, with intractable migraine, so stated, without mention of status migrainosus 11/11/2013   Palpitations    Patient desires pregnancy 11/27/2013   Plantar fasciitis    Prediabetes    Preeclampsia 05/21/2019   2x/wk testing nst alt w/ bpp/dopp      Deliver @ 37wks        IOL scheduled for 8/19 @ 0830   Pregnant 12/16/2015   SOBOE (shortness of breath on exertion)    Swelling of both lower extremities    Vitamin D deficiency     Tobacco History: Social History   Tobacco Use  Smoking Status Never  Smokeless Tobacco Never   Counseling given: Not Answered   Outpatient Medications Prior to Visit  Medication Sig Dispense Refill   acetaZOLAMIDE (DIAMOX) 125 MG tablet Take 1 tablet (125 mg total) by mouth 2 (two) times daily. 60 tablet 6   ALPRAZolam (XANAX) 0.25 MG tablet Take 1-2 tabs (0.'25mg'$ -0.'50mg'$ ) 30-60 minutes before procedure. May repeat if needed.Do not drive. 4 tablet 0   ergocalciferol (VITAMIN D2) 1.25 MG (50000 UT) capsule 1 po q wed, and 1 po q sun 8 capsule 6   pantoprazole (PROTONIX) 40 MG tablet Take one tablet by mouth once daily, as needed, for heartburn 30 tablet 6   No facility-administered medications prior to visit.      Review of Systems  Review of Systems   Physical Exam  BP 120/72   Pulse 85   Temp 98.1 F (36.7 C) (Oral)   Ht '5\' 4"'$  (1.626 m)   Wt 254 lb 2 oz (115.3 kg)   SpO2 100%   BMI 43.62 kg/m  Physical Exam   Lab Results:  CBC    Component Value Date/Time   WBC 5.0 12/08/2020 1151   WBC 6.4 03/20/2020 0703   RBC 4.86 12/08/2020 1151   RBC 4.52 03/20/2020 0703   HGB 12.8 12/08/2020 1151   HCT 40.2 12/08/2020 1151   PLT  411 12/08/2020 1151   MCV 83 12/08/2020 1151   MCH 26.3 (L) 12/08/2020 1151   MCH 27.4 03/20/2020 0703   MCHC 31.8 12/08/2020 1151   MCHC 33.2 03/20/2020 0703   RDW 13.1 12/08/2020 1151   LYMPHSABS 1.7 12/08/2020 1151   MONOABS 0.7 12/09/2011 1447   EOSABS 0.2 12/08/2020 1151   BASOSABS 0.0 12/08/2020 1151    BMET    Component Value Date/Time   NA 140 12/08/2020 1151   K 4.2 12/08/2020 1151   CL 103 12/08/2020 1151   CO2 21 12/08/2020 1151   GLUCOSE 90 12/08/2020 1151   GLUCOSE 97 03/20/2020 0703  BUN 9 12/08/2020 1151   CREATININE 0.77 12/08/2020 1151   CREATININE 0.71 03/20/2020 0703   CALCIUM 9.0 12/08/2020 1151   GFRNONAA 111 03/20/2020 0703   GFRAA 129 03/20/2020 0703    BNP No results found for: BNP  ProBNP No results found for: PROBNP  Imaging: No results found.   Assessment & Plan:   Mild sleep apnea - Patient has symptoms of excessive daytime sleepiness.  Epworth score 21/24.  BMI 43.  Home sleep study on 02/06/2022 showed mild obstructive sleep apnea, AHI 6.1/hr with SPO2 low 88%.  Reviewed sleep study results and treatment options.  Due to severity of fatigue symptoms recommending either oral appliance or CPAP therapy.  She will be started on auto CPAP 5 to 15 cm H2O with mask of choice.  Advised she aim to wear CPAP every night for minimum 4 to 6 hours.  She is motivated to work on weight loss.  Recommend she focus on side sleeping position or elevate head of bed at night.  Advised against driving experiencing excessive daytime sleepiness or fatigue.  Follow-up 31 to 90 days after starting CPAP for compliance check.   Martyn Ehrich, NP 03/11/2022

## 2022-03-11 NOTE — Patient Instructions (Signed)
Home sleep study showed evidence of mild obstructive sleep apnea, you had on average 6.1 apneic and hypopneic events an hour.  Due to the severity of your daytime fatigue and sleepiness I would recommend starting CPAP therapy you work on weight loss efforts.   Untreated sleep apnea puts you at higher risk for cardiac arrhythmias, pulmonary HTN, stroke and diabetes   Treatment options include weight loss, side sleeping position, oral appliance, CPAP therapy or referral to ENT for possible surgical options   Orders Auto CPAP 5 to 15 cm H2O with mask of choice, humidification, enroll in [facility]  Follow-up Please call to set up follow-up visit with [facility] NP for compliance check 31-90 days after you receive/start wearing your CPAP   CPAP and BIPAP Information CPAP and BIPAP are methods that use air pressure to keep your airways open and to help you breathe well. CPAP and BIPAP use different amounts of pressure. Your health care provider will tell you whether CPAP or BIPAP would be more helpful for you. CPAP stands for "continuous positive airway pressure." With CPAP, the amount of pressure stays the same while you breathe in (inhale) and out (exhale). BIPAP stands for "bi-level positive airway pressure." With BIPAP, the amount of pressure will be higher when you inhale and lower when you exhale. This allows you to take larger breaths. CPAP or BIPAP may be used in the hospital, or your health care provider may want you to use it at home. You may need to have a sleep study before your health care provider can order a machine for you to use at home. What are the advantages? CPAP or BIPAP can be helpful if you have: Sleep apnea. Chronic obstructive pulmonary disease (COPD). Heart failure. Medical conditions that cause muscle weakness, including muscular dystrophy or amyotrophic lateral sclerosis (ALS). Other problems that cause breathing to be shallow, weak, abnormal, or difficult. CPAP and BIPAP are  most commonly used for obstructive sleep apnea (OSA) to keep the airways from collapsing when the muscles relax during sleep. What are the risks? Generally, this is a safe treatment. However, problems may occur, including: Irritated skin or skin sores if the mask does not fit properly. Dry or stuffy nose or nosebleeds. Dry mouth. Feeling gassy or bloated. Sinus or lung infection if the equipment is not cleaned properly. When should CPAP or BIPAP be used? In most cases, the mask only needs to be worn during sleep. Generally, the mask needs to be worn throughout the night and during any daytime naps. People with certain medical conditions may also need to wear the mask at other times, such as when they are awake. Follow instructions from your health care provider about when to use the machine. What happens during CPAP or BIPAP?  Both CPAP and BIPAP are provided by a small machine with a flexible plastic tube that attaches to a plastic mask that you wear. Air is blown through the mask into your nose or mouth. The amount of pressure that is used to blow the air can be adjusted on the machine. Your health care provider will set the pressure setting and help you find the best mask for you. Tips for using the mask Because the mask needs to be snug, some people feel trapped or closed-in (claustrophobic) when first using the mask. If you feel this way, you may need to get used to the mask. One way to do this is to hold the mask loosely over your nose or mouth and then  gradually apply the mask more snugly. You can also gradually increase the amount of time that you use the mask. Masks are available in various types and sizes. If your mask does not fit well, talk with your health care provider about getting a different one. Some common types of masks include: Full face masks, which fit over the mouth and nose. Nasal masks, which fit over the nose. Nasal pillow or prong masks, which fit into the nostrils. If  you are using a mask that fits over your nose and you tend to breathe through your mouth, a chin strap may be applied to help keep your mouth closed. Use a skin barrier to protect your skin as told by your health care provider. Some CPAP and BIPAP machines have alarms that may sound if the mask comes off or develops a leak. If you have trouble with the mask, it is very important that you talk with your health care provider about finding a way to make the mask easier to tolerate. Do not stop using the mask. There could be a negative impact on your health if you stop using the mask. Tips for using the machine Place your CPAP or BIPAP machine on a secure table or stand near an electrical outlet. Know where the on/off switch is on the machine. Follow instructions from your health care provider about how to set the pressure on your machine and when you should use it. Do not eat or drink while the CPAP or BIPAP machine is on. Food or fluids could get pushed into your lungs by the pressure of the CPAP or BIPAP. For home use, CPAP and BIPAP machines can be rented or purchased through home health care companies. Many different brands of machines are available. Renting a machine before purchasing may help you find out which particular machine works well for you. Your health insurance company may also decide which machine you may get. Keep the CPAP or BIPAP machine and attachments clean. Ask your health care provider for specific instructions. Check the humidifier if you have a dry stuffy nose or nosebleeds. Make sure it is working correctly. Follow these instructions at home: Take over-the-counter and prescription medicines only as told by your health care provider. Ask if you can take sinus medicine if your sinuses are blocked. Do not use any products that contain nicotine or tobacco. These products include cigarettes, chewing tobacco, and vaping devices, such as e-cigarettes. If you need help quitting, ask  your health care provider. Keep all follow-up visits. This is important. Contact a health care provider if: You have redness or pressure sores on your head, face, mouth, or nose from the mask or head gear. You have trouble using the CPAP or BIPAP machine. You cannot tolerate wearing the CPAP or BIPAP mask. Someone tells you that you snore even when wearing your CPAP or BIPAP. Get help right away if: You have trouble breathing. You feel confused. Summary CPAP and BIPAP are methods that use air pressure to keep your airways open and to help you breathe well. If you have trouble with the mask, it is very important that you talk with your health care provider about finding a way to make the mask easier to tolerate. Do not stop using the mask. There could be a negative impact to your health if you stop using the mask. Follow instructions from your health care provider about when to use the machine. This information is not intended to replace advice given to you  by your health care provider. Make sure you discuss any questions you have with your health care provider. Document Revised: 05/05/2021 Document Reviewed: 09/04/2020 Elsevier Patient Education  [place].

## 2022-03-12 ENCOUNTER — Ambulatory Visit
Admission: RE | Admit: 2022-03-12 | Discharge: 2022-03-12 | Disposition: A | Discharge status: Home / Self Care | Payer: 59 | Source: Ambulatory Visit | Attending: Neurology | Admitting: Neurology

## 2022-03-12 DIAGNOSIS — G441 Vascular headache, not elsewhere classified: Secondary | ICD-10-CM

## 2022-03-12 DIAGNOSIS — G935 Compression of brain: Secondary | ICD-10-CM

## 2022-03-12 DIAGNOSIS — G932 Benign intracranial hypertension: Secondary | ICD-10-CM

## 2022-03-12 DIAGNOSIS — R519 Headache, unspecified: Secondary | ICD-10-CM

## 2022-03-12 DIAGNOSIS — H4710 Unspecified papilledema: Secondary | ICD-10-CM

## 2022-03-12 DIAGNOSIS — G4484 Primary exertional headache: Secondary | ICD-10-CM

## 2022-03-12 DIAGNOSIS — H539 Unspecified visual disturbance: Secondary | ICD-10-CM

## 2022-03-12 DIAGNOSIS — R51 Headache with orthostatic component, not elsewhere classified: Secondary | ICD-10-CM

## 2022-03-12 MED ORDER — GADOBENATE DIMEGLUMINE 529 MG/ML IV SOLN
20.0000 mL | Freq: Once | INTRAVENOUS | Status: AC | PRN
  Administered 2022-03-12: 20 mL via INTRAVENOUS

## 2022-03-14 ENCOUNTER — Telehealth: Payer: Self-pay | Admitting: *Deleted

## 2022-03-14 NOTE — Telephone Encounter (Signed)
Spoke to patient gave test results for MR MRV head and MRI Brain. Pt wants to move forward with CT scan per Dr. Cathren Laine recommendation. Pt states she has her appointment already for Neurosurgery. Informed patient   Per Dr Jaynee Eagles please start Diamox . Pt expressed understanding . Will forward to Dr Jaynee Eagles to order CT Scan

## 2022-03-14 NOTE — Telephone Encounter (Signed)
-----   Message from Melvenia Beam, MD sent at 03/14/2022 11:42 AM EDT ----- Imaging of the blood vessels of the head show some decrease flow in one of one of the veins, I would like to get a better scan (a CT Scan) to look at it further if that is ok with patient, please call her. Nothing concerning but I would like to take a closer look, if she agrees please let me know. Also she can start diamox at this time, I already prescribed it for her. thanks.

## 2022-03-14 NOTE — Telephone Encounter (Signed)
Called pt and LVM with office number and hours asking for call back.

## 2022-03-14 NOTE — Progress Notes (Signed)
Reviewed and agree with assessment/plan.   Chesley Mires, MD [facility] Pulmonary/Critical Care 03/14/2022, 1:49 PM Pager:  [phone]

## 2022-03-15 ENCOUNTER — Other Ambulatory Visit: Payer: Self-pay | Admitting: Neurology

## 2022-03-15 ENCOUNTER — Ambulatory Visit: Payer: 59 | Admitting: Family Medicine

## 2022-03-15 ENCOUNTER — Telehealth: Payer: Self-pay | Admitting: Neurology

## 2022-03-15 DIAGNOSIS — H4710 Unspecified papilledema: Secondary | ICD-10-CM

## 2022-03-15 DIAGNOSIS — R519 Headache, unspecified: Secondary | ICD-10-CM

## 2022-03-15 DIAGNOSIS — G08 Intracranial and intraspinal phlebitis and thrombophlebitis: Secondary | ICD-10-CM

## 2022-03-15 DIAGNOSIS — G8929 Other chronic pain: Secondary | ICD-10-CM

## 2022-03-15 DIAGNOSIS — G932 Benign intracranial hypertension: Secondary | ICD-10-CM

## 2022-03-15 NOTE — Telephone Encounter (Signed)
[employer] sent to GI they obtain auth and will call the patient to schedule 

## 2022-03-17 ENCOUNTER — Ambulatory Visit
Admission: RE | Admit: 2022-03-17 | Discharge: 2022-03-17 | Disposition: A | Discharge status: Home / Self Care | Payer: 59 | Source: Ambulatory Visit | Attending: Neurology | Admitting: Neurology

## 2022-03-17 ENCOUNTER — Telehealth: Payer: Self-pay | Admitting: Neurology

## 2022-03-17 DIAGNOSIS — G932 Benign intracranial hypertension: Secondary | ICD-10-CM

## 2022-03-17 DIAGNOSIS — H4710 Unspecified papilledema: Secondary | ICD-10-CM

## 2022-03-17 DIAGNOSIS — G08 Intracranial and intraspinal phlebitis and thrombophlebitis: Secondary | ICD-10-CM

## 2022-03-17 DIAGNOSIS — G8929 Other chronic pain: Secondary | ICD-10-CM

## 2022-03-17 MED ORDER — IOPAMIDOL (ISOVUE-370) INJECTION 76%
75.0000 mL | Freq: Once | INTRAVENOUS | Status: AC | PRN
  Administered 2022-03-17: 75 mL via INTRAVENOUS

## 2022-03-17 MED ORDER — TOPIRAMATE 50 MG PO TABS
50.0000 mg | ORAL_TABLET | Freq: Every day | ORAL | 2 refills | Status: DC
Start: 2022-03-17 — End: 2022-04-04

## 2022-03-17 NOTE — Telephone Encounter (Signed)
Diamox canceled and Topiramate 50 PO QHS prescribed per v.o. Dr Jaynee Eagles. Pt has a f/u appt next month.

## 2022-03-17 NOTE — Telephone Encounter (Signed)
Called pt and LVM asking for call back asap.

## 2022-03-17 NOTE — Telephone Encounter (Signed)
Pt said having side effects to acetaZOLAMIDE (DIAMOX) 125 MG tablet  . Chest pain behind left breast, bottom tongue is burnt, frequent urination., drowsiness. Would like a call to discuss if need to continue medication

## 2022-03-17 NOTE — Telephone Encounter (Signed)
Spoke with the patient.  She states she took half a tablet once on Tuesday evening and then once on Wednesday because she was already noticing the drowsiness.  She also noticed increase in urination which she understands is a normal side effect.  She states she has had nausea and reflux.  Yesterday she took Tums and Protonix.  She states this morning she had chest pain that was pretty severe and did not feel like reflux but does not have any pain right now.  She states the pain was in the middle top part of her chest and behind the left breast.  She noted some tingling in her fingers.  She states she is very sensitive to medications.  She is willing to take the medication again if needed.  I let her know we would check with Dr. Jaynee Eagles.  I also advised that when she has acute chest pain we recommend emergency department to be sure there is no cardiac etiology.  The patient verbalized understanding.  She states she has her CT today at 1:50 PM and she will see neurosurgery next week on the 19th.  Patient verbalized appreciation for the call and will await instructions.

## 2022-03-17 NOTE — Addendum Note (Signed)
Addended by: Gildardo Griffes on: 03/17/2022 02:35 PM   Modules accepted: Orders

## 2022-03-18 ENCOUNTER — Ambulatory Visit: Payer: 59 | Admitting: Family Medicine

## 2022-03-18 ENCOUNTER — Encounter: Payer: Self-pay | Admitting: Family Medicine

## 2022-03-18 VITALS — BP 130/88 | HR 90 | Ht 64.0 in | Wt 251.0 lb

## 2022-03-18 DIAGNOSIS — I10 Essential (primary) hypertension: Secondary | ICD-10-CM | POA: Diagnosis not present

## 2022-03-18 DIAGNOSIS — G4719 Other hypersomnia: Secondary | ICD-10-CM

## 2022-03-18 DIAGNOSIS — R7303 Prediabetes: Secondary | ICD-10-CM

## 2022-03-18 DIAGNOSIS — R519 Headache, unspecified: Secondary | ICD-10-CM | POA: Diagnosis not present

## 2022-03-18 DIAGNOSIS — E559 Vitamin D deficiency, unspecified: Secondary | ICD-10-CM

## 2022-03-18 MED ORDER — METFORMIN HCL 500 MG PO TABS: 500.0000 mg | ORAL_TABLET | Freq: Every day | ORAL | 1 refills | Status: DC

## 2022-03-18 MED ORDER — HYDROCHLOROTHIAZIDE 25 MG PO TABS: 25.0000 mg | ORAL_TABLET | Freq: Every day | ORAL | 3 refills | Status: DC

## 2022-03-18 NOTE — Patient Instructions (Addendum)
F/U in 10 weeks  Have your l list of 5 or 6 questions for Neurosurgeon so you go prepared  Continue twice weekly vit d 50,000 IU and add  1000 IU once daily  Please get B12 and folate drawn today  Your irion is low, you are iron deficient, start iron with Vitc , which is OTC one tablet  twice daily  Resume once daily metformin  It is important that you exercise regularly at least 30 minutes 5 times a week. If you develop chest pain, have severe difficulty breathing, or feel very tired, stop exercising immediately and seek medical attention  Thanks for choosing [facility], we consider it a privelige to serve you.

## 2022-03-19 LAB — VITAMIN B12: Vitamin B-12: 809 pg/mL (ref 232–1245)

## 2022-03-19 LAB — FOLATE: Folate: 10.5 ng/mL (ref >3.0)

## 2022-03-21 ENCOUNTER — Ambulatory Visit: Payer: 59 | Admitting: Neurology

## 2022-03-21 NOTE — Progress Notes (Signed)
Janet [name]     MRN: [id number]      DOB: 1986-07-11   HPI Janet [name] is here for follow up and re-evaluation of chronic medical conditions, medication management and review of any available recent lab and radiology data.  Preventive health is updated, specifically  Cancer screening and Immunization.   Questions or concerns regarding consultations or procedures which the PT has had in the interim are  addressed. The PT had and adverse reaction to medication prescribed by Neurology so she discontinued it. Has been referred to Neurosrgery because of chiari malformation and headaches Requesting bariatric surgery be considered ahead of her ankle surgery as the wait time on that is indefinite and she continues to gain weig and suffer the complications from that  ROS Denies recent fever or chills. Denies sinus pressure, nasal congestion, ear pain or sore throat. Denies chest congestion, productive cough or wheezing. Denies chest pains, palpitations and leg swelling Denies abdominal pain, nausea, vomiting,diarrhea or constipation.   Denies dysuria, frequency, hesitancy or incontinence.  Denies skin break down or rash.   PE  BP 130/88   Pulse 90   Ht '5\' 4"'$  (1.626 m)   Wt 251 lb 0.6 oz (113.9 kg)   SpO2 98%   BMI 43.09 kg/m    Patient alert and oriented and in no cardiopulmonary distress.  HEENT: No facial asymmetry, EOMI,     Neck supple .  Chest: Clear to auscultation bilaterally.  CVS: S1, S2 no murmurs, no S3.Regular rate.  ABD: Soft non tender.   Ext: No edema  MS: Adequate ROM spine, shoulders, hips and knees.  Skin: Intact, no ulcerations or rash noted.  Psych: Good eye contact, normal affect. Memory intact not anxious or depressed appearing.  CNS: CN 2-12 intact, power,  normal throughout.no focal deficits noted.   Assessment & Plan  Excessive daytime sleepiness New diagnosis of OSA in 03/2022  Essential hypertension DASH diet and commitment to daily  physical activity for a minimum of 30 minutes discussed and encouraged, as a part of hypertension management. The importance of attaining a healthy weight is also discussed.     03/21/2022    8:29 PM 03/18/2022    2:20 PM 03/11/2022   11:38 AM 02/28/2022    7:50 AM 02/01/2022    4:10 PM 02/01/2022    3:38 PM 12/03/2021    1:06 PM  BP/Weight  Systolic BP 883 254 982 641 583 094 076  Diastolic BP 88 808 72 86 98 88 92  Wt. (Lbs)  251.04 254.13 251  250 240  BMI  43.09 kg/m2 43.62 kg/m2 43.08 kg/m2  42.91 kg/m2 41.2 kg/m2       Morbid obesity (HCC) Progressively worsening   Patient re-educated about  the importance of commitment to a  minimum of 150 minutes of exercise per week as able.  The importance of healthy food choices with portion control discussed, as well as eating regularly and within a 12 hour window most days. The need to choose "clean , green" food 50 to 75% of the time is discussed, as well as to make water the primary drink and set a goal of 64 ounces water daily.       03/18/2022    2:20 PM 03/11/2022   11:38 AM 02/28/2022    7:50 AM  Weight /BMI  Weight 251 lb 0.6 oz 254 lb 2 oz 251 lb  Height '5\' 4"'$  (1.626 m) '5\' 4"'$  (1.626 m) '5\' 4"'$  (1.626 m)  BMI 43.09 kg/m2 43.62 kg/m2 43.08 kg/m2       Headache disorder New is topamax per Neurology  Vitamin D deficiency Twice weekly vit d prescribed  Prediabetes Patient educated about the importance of limiting  Carbohydrate intake , the need to commit to daily physical activity for a minimum of 30 minutes , and to commit weight loss. The fact that changes in all these areas will reduce or eliminate all together the development of diabetes is stressed. Recommend daily metformin      Latest Ref Rng & Units 12/08/2020   11:51 AM 03/20/2020    7:03 AM 10/07/2019   12:23 PM 08/20/2019    4:12 PM 06/06/2019    4:48 AM  Diabetic Labs  HbA1c 4.8 - 5.6 % 6.2       Chol 100 - 199 mg/dL 180  167      HDL >39 mg/dL 49  54       Calc LDL 0 - 99 mg/dL 118  99      Triglycerides 0 - 149 mg/dL 70  55      Creatinine 0.57 - 1.00 mg/dL 0.77  0.71  0.80  0.68  0.67       03/21/2022    8:29 PM 03/18/2022    2:20 PM 03/11/2022   11:38 AM 02/28/2022    7:50 AM 02/01/2022    4:10 PM 02/01/2022    3:38 PM 12/03/2021    1:06 PM  BP/Weight  Systolic BP 627 035 009 381 829 937 169  Diastolic BP 88 678 72 86 98 88 92  Wt. (Lbs)  251.04 254.13 251  250 240  BMI  43.09 kg/m2 43.62 kg/m2 43.08 kg/m2  42.91 kg/m2 41.2 kg/m2       No data to display

## 2022-03-21 NOTE — Assessment & Plan Note (Signed)
New diagnosis of OSA in 03/2022

## 2022-03-21 NOTE — Assessment & Plan Note (Signed)
New is topamax per Neurology

## 2022-03-21 NOTE — Assessment & Plan Note (Signed)
Progressively worsening   Patient re-educated about  the importance of commitment to a  minimum of 150 minutes of exercise per week as able.  The importance of healthy food choices with portion control discussed, as well as eating regularly and within a 12 hour window most days. The need to choose "clean , green" food 50 to 75% of the time is discussed, as well as to make water the primary drink and set a goal of 64 ounces water daily.       03/18/2022    2:20 PM 03/11/2022   11:38 AM 02/28/2022    7:50 AM  Weight /BMI  Weight 251 lb 0.6 oz 254 lb 2 oz 251 lb  Height '5\' 4"'$  (1.626 m) '5\' 4"'$  (1.626 m) '5\' 4"'$  (1.626 m)  BMI 43.09 kg/m2 43.62 kg/m2 43.08 kg/m2

## 2022-03-21 NOTE — Assessment & Plan Note (Signed)
DASH diet and commitment to daily physical activity for a minimum of 30 minutes discussed and encouraged, as a part of hypertension management. The importance of attaining a healthy weight is also discussed.     03/21/2022    8:29 PM 03/18/2022    2:20 PM 03/11/2022   11:38 AM 02/28/2022    7:50 AM 02/01/2022    4:10 PM 02/01/2022    3:38 PM 12/03/2021    1:06 PM  BP/Weight  Systolic BP 295 621 308 657 846 962 952  Diastolic BP 88 841 72 86 98 88 92  Wt. (Lbs)  251.04 254.13 251  250 240  BMI  43.09 kg/m2 43.62 kg/m2 43.08 kg/m2  42.91 kg/m2 41.2 kg/m2

## 2022-03-21 NOTE — Assessment & Plan Note (Signed)
Patient educated about the importance of limiting  Carbohydrate intake , the need to commit to daily physical activity for a minimum of 30 minutes , and to commit weight loss. The fact that changes in all these areas will reduce or eliminate all together the development of diabetes is stressed. Recommend daily metformin      Latest Ref Rng & Units 12/08/2020   11:51 AM 03/20/2020    7:03 AM 10/07/2019   12:23 PM 08/20/2019    4:12 PM 06/06/2019    4:48 AM  Diabetic Labs  HbA1c 4.8 - 5.6 % 6.2       Chol 100 - 199 mg/dL 180  167      HDL >39 mg/dL 49  54      Calc LDL 0 - 99 mg/dL 118  99      Triglycerides 0 - 149 mg/dL 70  55      Creatinine 0.57 - 1.00 mg/dL 0.77  0.71  0.80  0.68  0.67       03/21/2022    8:29 PM 03/18/2022    2:20 PM 03/11/2022   11:38 AM 02/28/2022    7:50 AM 02/01/2022    4:10 PM 02/01/2022    3:38 PM 12/03/2021    1:06 PM  BP/Weight  Systolic BP 982 641 583 094 076 808 811  Diastolic BP 88 031 72 86 98 88 92  Wt. (Lbs)  251.04 254.13 251  250 240  BMI  43.09 kg/m2 43.62 kg/m2 43.08 kg/m2  42.91 kg/m2 41.2 kg/m2       No data to display

## 2022-03-21 NOTE — Assessment & Plan Note (Signed)
Twice weekly vit d prescribed

## 2022-03-28 ENCOUNTER — Ambulatory Visit: Payer: 59 | Admitting: Dietician

## 2022-03-31 ENCOUNTER — Telehealth: Payer: Self-pay | Admitting: Neurology

## 2022-03-31 NOTE — Telephone Encounter (Signed)
I called pt.  She had visit with Dr Venetia Constable on Friday, he told her to take diamox, topamax would not work?  She had tingling, light headedness  with the '125mg'$  po bid diamox.  She then had sx additional headache, chest started to hurt, so she stopped and is wondering about taking topamax.  I relayed will ask and message her back.  She see opthamology on Tuesday.

## 2022-03-31 NOTE — Telephone Encounter (Signed)
Pt called stating that she saw the Neurosurgeon Friday and she states that the neurosurgeon informed her that the  topiramate (TOPAMAX) 50 MG tablet will not help her and to start back up on the Diomax but when she took the Diomax Sat she started having an acute headache and dizziness so today she has not taken it and would like to know if she should get back on the topiramate (TOPAMAX) 50 MG tablet Please advise.

## 2022-04-04 MED ORDER — TOPIRAMATE 100 MG PO TABS: 100.0000 mg | ORAL_TABLET | Freq: Every day | ORAL | 3 refills | Status: DC

## 2022-04-04 NOTE — Telephone Encounter (Signed)
Message from Dr Lucia Gaskins:  I would go back to the Topiramate and increase the dose to 50mg  twice daily. Diamox can be the better drug however so if she can try to tolerate it for a bit longer I would recommend trying...if not she can go to a hiher dose of the topiramate (please prescribe) thanks

## 2022-04-05 LAB — HM DIABETES EYE EXAM

## 2022-04-08 ENCOUNTER — Encounter: Payer: 59 | Attending: General Surgery | Admitting: Dietician

## 2022-04-08 ENCOUNTER — Encounter: Payer: Self-pay | Admitting: Dietician

## 2022-04-08 DIAGNOSIS — E669 Obesity, unspecified: Secondary | ICD-10-CM | POA: Diagnosis present

## 2022-04-08 NOTE — Progress Notes (Signed)
Nutrition Assessment for Bariatric Surgery Medical Nutrition Therapy Appt Start Time: 10:55    End Time: 12:05  Patient was seen on 04/08/2022 for Pre-Operative Nutrition Assessment. Letter of approval faxed to [facility] bariatric surgery program coordinator on 04/08/2022.   Referral stated Supervised Weight Loss (SWL) visits needed: 5  Planned surgery: RYGB   NUTRITION ASSESSMENT   Anthropometrics  Start weight at [facility]: 248.4 lbs (date: 04/08/2022)  Height: 64 in BMI: 42.64 kg/m2     Clinical  Medical hx: Sleep apnea, obesity, GERD, Nerve/muscle disease, anemia, heart murmur, Cushing's Syndrome Medications: Vit D  Labs: per patient 09 Mar 2022: Vit D 15.6; A1C 6.1; Chol 179; LDL 106; HDL 48 Notable signs/symptoms: none noted Any previous deficiencies? No  Micronutrient Nutrition Focused Physical Exam: Hair: No issues observed Eyes: No issues observed Mouth: No issues observed Neck: No issues observed Nails: No issues observed Skin: No issues observed  Lifestyle & Dietary Hx  Pt states she wants to get back to her normal wt, stating it is 130-140 (pre-Cushing wt.). Patient lives with husband and 2 daughters. The pt performs the food shopping and the pt prepares the meals. She reports that she typically skips or misses 7-8 out of 21 possible meals per week. She may have 2-3 meals per week that are take-out or at a restaurant.  Patient works as an Radio broadcast assistant. She denies binge eating or feeling shame and/or guilt after eating too much food.  She denies having used laxatives or vomiting to facilitate weight loss. She denies emotional eating during times of stress. Pt states she had ankle surgery last year, stating she can walk on her ankle now. Pt states she is waiting for a cadaver for bone match for her ankle. Pt states that Diamox med for brain makes her very tired. 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 Pt states she likes yogurt with fruit. Pt states she doesn't have an appetite with med Pt state she used to love sodas, but with the diamox sodas taste bad. Pt state she used to go with out eating and drink a soda.  Pt states she does not do that anymore. Pt states she will get a craving for a snack right before her cycle, stating which may be sweets. Pt states diamox make her thirsty and she drinks all the time.   24-Hr Dietary Recall First Meal: wake up at 3 am, skip or yogurt around 4:30 or 5 am Snack:  Second Meal: 11:00 am market salad from [employer]:  Third Meal: Chicken and broccoli (steamed) or spinach or asparagus or squash Snack:  Beverages: water, cranberry juice (rarely)    NUTRITION DIAGNOSIS  Overweight/obesity ([place]-3.3) related to past poor dietary habits and physical inactivity as evidenced by patient w/ planned RYGB surgery following dietary guidelines for continued weight loss.    NUTRITION INTERVENTION  Nutrition counseling (C-1) and education (E-2) to facilitate bariatric surgery goals.  Educated pt on micronutrient deficiencies post surgery and strategies to mitigate that risk   Pre-Op Goals Reviewed with the Patient Track food and beverage intake (pen and paper, MyFitness Pal, Baritastic app, etc.) Make healthy food choices while monitoring portion sizes Consume 3 meals per day or try to eat every 3-5 hours Avoid concentrated sugars and fried foods Keep sugar & fat in the single digits per serving on food  labels Practice CHEWING your food (aim for applesauce consistency) Practice not drinking 15 minutes before, during, and 30 minutes after each meal and snack Avoid all carbonated beverages (ex: soda, sparkling beverages)  Limit caffeinated beverages (ex: coffee, tea, energy drinks) Avoid all sugar-sweetened beverages (ex: regular soda, sports drinks)  Avoid  alcohol  Aim for 64-100 ounces of FLUID daily (with at least half of fluid intake being plain water)  Aim for at least 60-80 grams of PROTEIN daily Look for a liquid protein source that contains ?15 g protein and ?5 g carbohydrate (ex: shakes, drinks, shots) Make a list of non-food related activities Physical activity is an important part of a healthy lifestyle so keep it moving! The goal is to reach 150 minutes of exercise per week, including cardiovascular and weight baring activity.  *Goals that are bolded indicate the pt would like to start working towards these  Handouts Provided Include  Bariatric Surgery handouts (Nutrition Visits, Pre-Op Goals, Protein Shakes, Vitamins & Minerals)  Learning Style & Readiness for Change Teaching method utilized: Visual & Auditory  Demonstrated degree of understanding via: Teach Back  Readiness Level: preparation Barriers to learning/adherence to lifestyle change: none identified  RD's Notes for Next Visit Progress toward patient chosen goals.     MONITORING & EVALUATION Dietary intake, weekly physical activity, body weight, and pre-op goals reached at next nutrition visit.    Next Steps  Patient is to follow up at [facility] for Pre-Op Class >2 weeks before surgery for further nutrition education.

## 2022-04-18 ENCOUNTER — Encounter: Payer: 59 | Admitting: Dietician

## 2022-04-20 ENCOUNTER — Ambulatory Visit (INDEPENDENT_AMBULATORY_CARE_PROVIDER_SITE_OTHER): Payer: 59 | Admitting: Family Medicine

## 2022-04-20 ENCOUNTER — Encounter: Payer: Self-pay | Admitting: Family Medicine

## 2022-04-20 DIAGNOSIS — E249 Cushing's syndrome, unspecified: Secondary | ICD-10-CM

## 2022-04-20 DIAGNOSIS — I10 Essential (primary) hypertension: Secondary | ICD-10-CM

## 2022-04-20 DIAGNOSIS — G932 Benign intracranial hypertension: Secondary | ICD-10-CM | POA: Diagnosis not present

## 2022-04-20 NOTE — Assessment & Plan Note (Signed)
Has Endo f/u at [facility] in 05/2022, no dx of Cushings currently

## 2022-04-20 NOTE — Patient Instructions (Signed)
F/U as before, call if you need me sooner  GREAT that you report 8 pound weight loss  I recommend discussing alternate options with Bariatric clinic since you are formally a pt , if you have reservations and want to delay the surgery that is certainly worth considering esp  since you report weight loss in past several weeks   My recommendation is still that Bariatric surgery is the better option for you  Thanks for choosing [facility], we consider it a privelige to serve you.

## 2022-04-20 NOTE — Progress Notes (Signed)
Virtual Visit via Video Note  I connected with Janet [name] on 04/20/22 at 11:40 AM EDT by a video enabled telemedicine application and verified that I am speaking with the correct person using two identifiers.  Location: Patient: work Provider: home   I discussed the limitations of evaluation and management by telemedicine and the availability of in person appointments. The patient expressed understanding and agreed to proceed.  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 She also has a f/u appt with Neurology in [place] who had referred her to Neurosurgery in [place] also   Observations/Objective: BP 110/78   Ht '5\' 4"'$  (1.626 m)   Wt 246 lb (111.6 kg)   BMI 42.23 kg/m  Good communication with no confusion and intact memory. Alert and oriented x 3 No signs of respiratory distress during speech   Assessment and Plan:  Cushing's syndrome ([place]) Has Endo f/u at [facility] in 05/2022, no dx of Cushings currently  Essential hypertension Controlled, no change in medication .  Morbid obesity ([place])  Patient re-educated about  the importance of commitment to a  minimum of 150 minutes of exercise per week as able.  The importance of healthy food choices with portion control discussed, as well as eating regularly and within a 12 hour window most days. The need to choose "clean , green" food 50 to 75% of the time is discussed, as well as to make water the primary drink and set a goal of 64 ounces water daily.       04/20/2022   10:25 AM 04/08/2022   12:01  PM 03/18/2022    2:20 PM  Weight /BMI  Weight 246 lb 248 lb 6.4 oz 251 lb 0.6 oz  Height '5\' 4"'$  (1.626 m) '5\' 4"'$  (1.626 m) '5\' 4"'$  (1.626 m)  BMI 42.23 kg/m2 42.64 kg/m2 43.09 kg/m2    Still recommed surgery, however both at [facility] and getting treated a bariatric clinic, doubt wegovy will get her where she needs to be, however very encouraged that she recently had an 8 pound weight loss  Intracranial hypertension Has arnold chiari malformatoin and is current ly seeing neurosurgery at [facility] , definitive mx is pending review of ophthalmology record  Follow Up Instructions:    I discussed the assessment and treatment plan with the patient. The patient was provided an opportunity to ask questions and all were answered. The patient agreed with the plan and demonstrated an understanding of the instructions.   The patient was advised to call back or seek an in-person evaluation if the symptoms worsen or if the condition fails to improve as anticipated.  I provided 14 minutes of non-face-to-face time during this encounter.   Tula Nakayama, MD

## 2022-04-20 NOTE — Assessment & Plan Note (Signed)
Controlled, no change in medication  

## 2022-04-20 NOTE — Assessment & Plan Note (Signed)
  Patient re-educated about  the importance of commitment to a  minimum of 150 minutes of exercise per week as able.  The importance of healthy food choices with portion control discussed, as well as eating regularly and within a 12 hour window most days. The need to choose "clean , green" food 50 to 75% of the time is discussed, as well as to make water the primary drink and set a goal of 64 ounces water daily.       04/20/2022   10:25 AM 04/08/2022   12:01 PM 03/18/2022    2:20 PM  Weight /BMI  Weight 246 lb 248 lb 6.4 oz 251 lb 0.6 oz  Height '5\' 4"'$  (1.626 m) '5\' 4"'$  (1.626 m) '5\' 4"'$  (1.626 m)  BMI 42.23 kg/m2 42.64 kg/m2 43.09 kg/m2    Still recommed surgery, however both at [facility] and getting treated a bariatric clinic, doubt wegovy will get her where she needs to be, however very encouraged that she recently had an 8 pound weight loss

## 2022-04-20 NOTE — Assessment & Plan Note (Signed)
Has arnold chiari malformatoin and is current ly seeing neurosurgery at [facility] , definitive mx is pending review of ophthalmology record

## 2022-04-25 ENCOUNTER — Encounter: Payer: 59 | Attending: Family Medicine | Admitting: Dietician

## 2022-04-25 ENCOUNTER — Encounter: Payer: Self-pay | Admitting: Dietician

## 2022-04-25 DIAGNOSIS — Z713 Dietary counseling and surveillance: Secondary | ICD-10-CM | POA: Diagnosis not present

## 2022-04-25 DIAGNOSIS — E669 Obesity, unspecified: Secondary | ICD-10-CM | POA: Insufficient documentation

## 2022-04-25 DIAGNOSIS — Z6841 Body Mass Index (BMI) 40.0 and over, adult: Secondary | ICD-10-CM | POA: Diagnosis not present

## 2022-04-25 NOTE — Progress Notes (Signed)
Supervised Weight Loss Visit Bariatric Nutrition Education Appt Start Time: 2:36    End Time: 2:53  Planned surgery: RYGB  1 out of 5 SWL Appointments   NUTRITION ASSESSMENT  Anthropometrics  Start weight at [facility]: 248.4 lbs (date: 04/08/2022)  Height: 64 in Weight today: 244.1 lbs BMI: 41.90 kg/m2     Clinical  Medical hx: Sleep apnea, obesity, GERD, Nerve/muscle disease, anemia, heart murmur, Cushing's Syndrome Medications: Vit D  Labs: per patient 09 Mar 2022: Vit D 15.6; A1C 6.1; Chol 179; LDL 106; HDL 48 Notable signs/symptoms: none noted Any previous deficiencies? No  Lifestyle & Dietary Hx  Pt states last week she did the cabbage soup diet for 5 days last week. Stating she just wanted to try it for weight loss.  She states she had never done that diet before. Pt states she is getting more walking in at work, but last week her ankle was giving her trouble because she worked 7 days straight.  Pt states she had surgury on her ankle and is waiting for a cadaver for another ankle surgery. Pt states Diamox makes her thirsty. Pt states she is trying to get bariatric surgery and wt loss quickly, stating she has to get a shunt put in her brain., stating the neurosurgeon is anxious for her to lose the wt, to avoid putting in a shunt.  Estimated daily fluid intake: 160 oz Supplements:  Current average weekly physical activity: walking around her neighborhood, at least 5 days a week.  24-Hr Dietary Recall First Meal: fruit (peach) or vegetables or lowfat yogurt and banana or chicken Snack:  Second Meal: chicken or cabbage soup Snack: baked potato Third Meal: cabbage soup Snack:  Beverages: water  Estimated Energy Needs Calories: 1500   NUTRITION DIAGNOSIS  Overweight/obesity ([place]-3.3) related to past poor dietary habits and physical inactivity as evidenced by patient w/ planned RYGB surgery following dietary guidelines for continued weight loss.   NUTRITION INTERVENTION   Nutrition counseling (C-1) and education (E-2) to facilitate bariatric surgery goals.  Why you need complex carbohydrates: Whole grains and other complex carbohydrates are required to have a healthy diet. Whole grains provide fiber which can help with blood glucose levels and help keep you satiated. Fruits and starchy vegetables provide essential vitamins and minerals required for immune function, eyesight support, brain support, bone density, wound healing and many other functions within the body. According to the current evidenced based 2020-2025 Dietary Guidelines for Americans, complex carbohydrates are part of a healthy eating pattern which is associated with a decreased risk for type 2 diabetes, cancers, and cardiovascular disease.    Pre-Op Goals Progress & New Goals Continue: eating throughout the day (  Handouts Provided Include   Learning Style & Readiness for Change Teaching method utilized: Visual & Auditory  Demonstrated degree of understanding via: Teach Back  Readiness Level: preparation Barriers to learning/adherence to lifestyle change: none identified  RD's Notes for next Visit  Progress toward pt chosen goals   MONITORING & EVALUATION Dietary intake, weekly physical activity, body weight, and pre-op goals in 1 month.   Next Steps  Patient is to return to [facility] in 2 weeks for next SWL visit.

## 2022-04-27 ENCOUNTER — Encounter: Payer: Self-pay | Admitting: Neurology

## 2022-04-27 ENCOUNTER — Other Ambulatory Visit (HOSPITAL_COMMUNITY): Payer: Self-pay

## 2022-04-27 ENCOUNTER — Ambulatory Visit: Payer: 59 | Admitting: Neurology

## 2022-04-27 VITALS — BP 132/81 | HR 62 | Ht 64.0 in | Wt 247.0 lb

## 2022-04-27 DIAGNOSIS — I159 Secondary hypertension, unspecified: Secondary | ICD-10-CM

## 2022-04-27 DIAGNOSIS — G932 Benign intracranial hypertension: Secondary | ICD-10-CM | POA: Diagnosis not present

## 2022-04-27 DIAGNOSIS — G935 Compression of brain: Secondary | ICD-10-CM | POA: Diagnosis not present

## 2022-04-27 DIAGNOSIS — E785 Hyperlipidemia, unspecified: Secondary | ICD-10-CM | POA: Diagnosis not present

## 2022-04-27 DIAGNOSIS — G4730 Sleep apnea, unspecified: Secondary | ICD-10-CM

## 2022-04-27 DIAGNOSIS — E1149 Type 2 diabetes mellitus with other diabetic neurological complication: Secondary | ICD-10-CM

## 2022-04-27 MED ORDER — MOUNJARO 2.5 MG/0.5ML ~~LOC~~ SOAJ: 2.5000 mg | SUBCUTANEOUS | 0 refills | Status: DC

## 2022-04-27 MED ORDER — MOUNJARO 2.5 MG/0.5ML ~~LOC~~ SOAJ
2.5000 mg | SUBCUTANEOUS | 0 refills | Status: DC
  Filled 2022-04-27: qty 2, 28d supply, fill #0

## 2022-04-27 MED ORDER — ACETAZOLAMIDE 250 MG PO TABS: 500.0000 mg | ORAL_TABLET | Freq: Every evening | ORAL | 3 refills | Status: DC

## 2022-04-27 NOTE — Progress Notes (Signed)
Reason for visit: Headache, Arnold-Chiari malformation  Janet [name] is an 36 y.o. female  Since I last saw her she has seen neurosurgery, seen dr Zada Finders and he recommended no shunt, recommended seeing Dr. Katy Fitch. Saw Dr. Katy Fitch and he said weight loss and diamox. Luckily doing great on the diamox, and feeling much better, stable vision according to dr Katy Fitch, no worsening vision. Her symptoms as far as vision better, pressure headaches better but still some occipital sensitization sometimes. She is on diamox once daily '250mg'$  try 1.5 at bedtine then may increase to '500mg'$ .   Comorbidities to obesity include IDIOPATHIC INTRACRANIAL HYPERTENSION, High Cholesterol, HTN, sleep apnea.Hgba1c 8, tried   In the past he has used Trulicity, victoza, phentermine, glipizide, glyburide,metformin, Glimepiride, invokana, jardiance, januvia, Pioglitazone, victoza,, Ozempic and Saxenda with nausea and he has also tried metformin with diarrhea.,rybelsus, orlistat (Xenical, Alli), phentermine-topiramate (Qsymia), naltrexone-bupropion (Contrave), and semaglutide [facility]), gliflozins, actos, also has been involved in a formal weight loss program for > 1 year (weight watchers, bariatric clinic, healthy weight and wellness center, [facility], [facility] healthy weight loss, noom with great compliance)  MRI 03/12/2022:IMPRESSION: Unremarkable MRI scan of the brain with and without contrast showing type I Arnold-Chiari malformation with low-lying cerebellar tonsils as well as subtle enlargement of the optic nerve sheaths and partially empty sella which are incidental findings.  No significant change compared with previous MRI dated 05/01/2021.  CTV 03/12/2022: IMPRESSION: 1. 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 2. No thrombus is present. 3. The superior sagittal sinus, straight sinus, right transverse sinus and deep cerebral veins are patent.   Patient complains of symptoms  per HPI as well as the following symptoms: obesity . Pertinent negatives and positives per HPI. All others negative   02/28/2022: Patient is here today, transitioning to my care from Dr. Jannifer Franklin,  and has an 14mchiari malformation that appears peg like. She saw optometry who believes [place] have [place].  She has not seen an ophthalmology. Headaches feel like behind most eye, pressure in the back of the head, hurts when she sneeezes, neck pain, pressure, headaches hurt with bearing down. She has has also had migraines since childhood, but these are different. With her migraines, she has light and sound sensitivity, frontal area but now with more pressure. Tried topiramate in the past. Has tried Topiramate in 2022 with Dr. WJannifer Franklinand in 2014/2015 with Dr. SMoshe CiproNo episodes of blurry vision. Daily headaches, ongoing for years, now more pressure than with migrainous features and getting more intense.   MRI brain 05/03/2021: personally reviewed images and agree and reviewed images with patient as well: IMPRESSION: This MRI of the brain without contrast shows the following: 1.   Chiari malformation with 11 mm of cerebellar ectopia associated with some stenosis at the foramen magnum but no abnormal signal within the brainstem, spinal cord or cerebellum.  Compared to the MRI from 2017, there has been slight progression of an additional 1 mm. 2.   The brain is otherwise normal.    Patient complains of symptoms per HPI as well as the following symptoms: worsening headaches, chiari malformation . Pertinent negatives and positives per HPI. All others negative   History of present illness: Dr. WJannifer Franklin Ms. ACaldwellis a 36year old right-handed black female with a history of headaches.  She is having some form of headache on a daily basis.  She in the past has a history of Arnold-Chiari  malformation, she has reported pressure sensations coming of the back of the head with coughing or sneezing or straining, she may  see black spots in front of the eyes at times.  She also has what sounds like 2 migraine headaches with photophobia and phonophobia and some nausea.  These may occur 2 or 3 times a month, but she is having some form of headache on a daily basis.  Occasionally when she turns her head, she may get a headache.  Again this is brief in nature.  The patient recently had right ankle surgery, she is recovering from this, not fully weightbearing yet.  She comes back here for an evaluation.  She was placed on Topamax previously but could not tolerate the drug and went off the medication.  She may take Fioricet if needed for the headache.  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   r/t adrenal tumor; tumor removed, disease resolved   Cushing's syndrome ([place]) 11/27/2013   Gallbladder problem    Gastroesophageal reflux disease    GERD (gastroesophageal reflux disease)    Phreesia 08/12/2020   Headache    Heart murmur    Phreesia 08/12/2020   Hematuria 09/18/2015   Hemorrhoids    History of Chiari malformation    Hypertension    Phreesia 08/12/2020   Internal hemorrhoids with other complication 72/06/4708   JAN 2015 R ANT MAR 2015 R POS AND L LAT BAND    Menstrual periods irregular 09/18/2015   Migraine without aura, with intractable migraine, so stated, without mention of status migrainosus 11/11/2013   Palpitations    Patient desires pregnancy 11/27/2013   Plantar fasciitis    Prediabetes    Preeclampsia 05/21/2019   2x/wk testing nst alt w/ bpp/dopp      Deliver @ 37wks        IOL scheduled for 8/19 @ 0830   Pregnant 12/16/2015   SOBOE (shortness of breath on exertion)    Swelling of both lower extremities    Vitamin  D deficiency     Past Surgical History:  Procedure Laterality Date   arthroscopic treatment of osteochondral defect of right ankle  03/2021   Bilateral plantar fascial [place]  03/2021   CHOLECYSTECTOMY  02/2015   COLONOSCOPY N/A 04/19/2013   GGE:ZMOQHU mucosa in the terminal ileum/Single erosion in transverse colon/RECTAL BLEEDING DUE TO Moderate sized internal hemorrhoids/ trv colon erosion, bx benign.   COLONOSCOPY WITH PROPOFOL N/A 08/31/2020   Non-bleeding internal hemorrhoids. Colon torturous.    ESOPHAGOGASTRODUODENOSCOPY N/A 04/19/2013   TML:YYTK Non-erosive gastritis/PERI-UMBILICAL PAIN DUE TO GERD, GASTRITIS, AND CONSTIPATION. Bx with mild chronic inactive gastritis. No H.Pylori   HEMORRHOID BANDING  2015   Dr. Gala Romney- in office banding procedure   LAPAROSCOPIC ADRENALECTOMY  10/28/2010   TONSILLECTOMY     tumor removed left adrenal gland 01/12      Family History  Problem Relation Age of Onset   Hypertension Mother    Hyperlipidemia Mother    Colon polyps Mother    Hypertension Father    Colon polyps Father    Sleep apnea Father    Obesity Father  Deep vein thrombosis Sister    Hypertension Maternal Grandmother    Diabetes Maternal Grandmother    Deep vein thrombosis Maternal Grandmother    Diabetes Paternal Grandmother    COPD Paternal Grandfather    Heart disease Paternal Grandfather    Other Daughter        pulmonary valve stenosis   Stroke Maternal Aunt    Migraines Maternal Aunt    Colon cancer Other        maternal great uncle    Social history:  reports that she has never smoked. She has never used smokeless tobacco. She reports that she does not drink alcohol and does not use drugs.    Allergies  Allergen Reactions   Methylprednisolone Shortness Of Breath, Itching and Other (See Comments)    Reaction:  Bruising    Penicillins Anaphylaxis, Rash and Other (See Comments)    Has patient had a PCN reaction causing immediate rash, facial/tongue/throat  swelling, SOB or lightheadedness with hypotension: No Has patient had a PCN reaction causing severe rash involving mucus membranes or skin necrosis: No Has patient had a PCN reaction that required hospitalization No Has patient had a PCN reaction occurring within the last 10 years: No If all of the above answers are "NO", then may proceed with Cephalosporin use.   Latex Rash   Neomycin Rash    Breaks out with neosporin    Medications:  Prior to Admission medications   Medication Sig Start Date End Date Taking? Authorizing Provider  ergocalciferol (VITAMIN D2) 1.25 MG (50000 UT) capsule 1 po q wed, and 1 po q sun 01/05/21  Yes Opalski, Deborah, DO  Multiple Vitamin (MULTIVITAMIN) tablet Take 1 tablet by mouth daily.   Yes [provider]  pantoprazole (PROTONIX) 40 MG tablet Take one tablet by mouth once daily, as needed, for heartburn Patient taking differently: Take 40 mg by mouth daily. 06/30/20  Yes [name], MD  butalbital-acetaminophen-caffeine (FIORICET) [phone] MG tablet Take one tablet up to two times daily , as needed, for severe headache Patient not taking: Reported on 04/26/2021 08/13/20   [name], MD    Blood pressure 132/81, pulse 62, height '5\' 4"'$  (1.626 m), weight 247 lb (112 kg). Physical exam: Exam: stable Gen: NAD, conversant, well nourised, obese, well groomed                     CV: RRR, no MRG. No Carotid Bruits. No peripheral edema, warm, nontender Eyes: Conjunctivae clear without exudates or hemorrhage  Neuro: stable Detailed Neurologic Exam  Speech:    Speech is normal; fluent and spontaneous with normal comprehension.  Cognition:    The patient is oriented to person, place, and time;     recent and remote memory intact;     language fluent;     normal attention, concentration,     fund of knowledge Cranial Nerves:    The pupils are equal, round, and reactive to light.  Nerve heads appear elevated with slightly indistinct  margins. Visual fields are full to finger confrontation. Extraocular movements are intact. Trigeminal sensation is intact and the muscles of mastication are normal. The face is symmetric. The palate elevates in the midline. Hearing intact. Voice is normal. Shoulder shrug is normal. The tongue has normal motion without fasciculations.   Coordination:    Normal   Gait:    normal.   Motor Observation:    No asymmetry, no atrophy, and no involuntary movements noted.  Tone:    Normal muscle tone.    Posture:    Posture is normal. normal erect    Strength:    Strength is V/V in the upper and lower limbs.      Sensation: intact to LT     Reflex Exam:  DTR's:    Deep tendon reflexes in the upper and lower extremities are normal bilaterally.   Toes:    The toes are downgoing bilaterally.   Clonus:    Clonus is absent.    Assessment/Plan: Morbidly obese patient is here today, transitioning to my care from Dr. Jannifer Franklin, recent weight gain, hx of chiari malformation, last MRI showed 24mchiari malformation that appears peg like. She saw ophthalmology and neurosurgery since last visit, there is some ONH edema stable. Headaches pressure in the back of the head, hurts when she sneeezes, neck pain, pressure, headaches hurt with bearing down. She has has also had migraines since childhood, but these worsening headaches are different.  Daily headaches, ongoing for years, now more pressure than with migrainous features and getting more intense.  Unclear if symptoms due to IDIOPATHIC INTRACRANIAL HYPERTENSION or chiari or focal stenosis (see CTV). We will get her to lose weight, cont diamox and follow.  - doing better on diamox,will increase. Will continue to follow with dr gKaty Fitchand dr oZada Finders- Repeat CTV for follow focal stenosis after she loses weight, possibly 6 months to a year, can consider hiari decompression, venous stenting? Will have to follow and see what happens - Obesity is risk factor:  order mounjaro(Comorbidities to obesity include IDIOPATHIC INTRACRANIAL HYPERTENSION, High Cholesterol, HTN, sleep apnea.Hgba1c 8, tried   In the past he has used Trulicity, victoza, phentermine, glipizide, glyburide,metformin, Glimepiride, invokana, jardiance, januvia, Pioglitazone, victoza,, Ozempic and Saxenda with nausea and he has also tried metformin with diarrhea.,rybelsus, orlistat (Xenical, Alli), phentermine-topiramate (Qsymia), naltrexone-bupropion (Contrave), and semaglutide ([facility], gliflozins, actos, also has been involved in formal weight loss programs for > 1 year (weight watchers, bariatric clinic, healthy weight and wellness center, [facility], [facility] healthy weight loss, noom with great compliance)) - Hold off on Lumbar puncture for opening pressure, too risky given chiari  personally reviewed images and agree and reviewed images with patient as well: (additional 15 minutes reviewed images prior to appointment) MRI 03/12/2022:IMPRESSION: Unremarkable MRI scan of the brain with and without contrast showing type I Arnold-Chiari malformation with low-lying cerebellar tonsils as well as subtle enlargement of the optic nerve sheaths and partially empty sella which are incidental findings.  No significant change compared with previous MRI dated 05/01/2021.  CTV 03/12/2022: IMPRESSION: 1. 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 2. No thrombus is present. 3. The superior sagittal sinus, straight sinus, right transverse sinus and deep cerebral veins are patent.  .Sarina IllMD  Meds ordered this encounter  Medications   acetaZOLAMIDE (DIAMOX) 250 MG tablet    Sig: Take 2 tablets (500 mg total) by mouth at bedtime.    Dispense:  180 tablet    Refill:  3   DISCONTD: tirzepatide (MOUNJARO) 2.5 MG/0.5ML Pen    Sig: Inject 2.5 mg into the skin once a week.    Dispense:  2 mL    Refill:  0    BIN [id number] PCN [id number] ID [id number]   tirzepatide (MOUNJARO) 2.5 MG/0.5ML Pen    Sig: Inject 2.5 mg into the skin once a week.    Dispense:  2 mL    Refill:  0    BIN [id number] [facility] [id number] PCN [id number] ID [id number]     [facility] [place], [place] [address]  Phone [phone] Fax [phone]  I spent over 80 minutes of face-to-face and non-face-to-face time with patient on the  1. Chiari malformation type I ([place])   2. IIH (idiopathic intracranial hypertension)   3. Morbid obesity ([place])   4. Hyperlipidemia, unspecified hyperlipidemia type   5. Secondary hypertension   6. Sleep apnea, unspecified type   7. Type 2 diabetes mellitus with other neurologic complication, without long-term current use of insulin (HCC)     diagnosis.  This included previsit chart review, lab review, study review, order entry, electronic health record documentation, patient education on the different diagnostic and therapeutic options, counseling and coordination of care, risks and benefits of management, compliance, or risk factor reduction

## 2022-05-11 ENCOUNTER — Ambulatory Visit: Payer: 59 | Admitting: Primary Care

## 2022-05-18 ENCOUNTER — Encounter (INDEPENDENT_AMBULATORY_CARE_PROVIDER_SITE_OTHER): Payer: Self-pay

## 2022-05-24 ENCOUNTER — Ambulatory Visit: Payer: 59 | Admitting: Family Medicine

## 2022-05-27 ENCOUNTER — Encounter: Payer: Self-pay | Admitting: Family Medicine

## 2022-05-27 ENCOUNTER — Ambulatory Visit (INDEPENDENT_AMBULATORY_CARE_PROVIDER_SITE_OTHER): Payer: 59 | Admitting: Family Medicine

## 2022-05-27 DIAGNOSIS — G932 Benign intracranial hypertension: Secondary | ICD-10-CM | POA: Diagnosis not present

## 2022-05-27 DIAGNOSIS — G935 Compression of brain: Secondary | ICD-10-CM | POA: Diagnosis not present

## 2022-05-27 NOTE — Patient Instructions (Signed)
F/U in office mid to end October , call if you need me sooner   Support fully bariatric surgery for you, and trust that you will do very wekll  It is important that you exercise regularly at least 30 minutes 5 times a week. If you develop chest pain, have severe difficulty breathing, or feel very tired, stop exercising immediately and seek medical attention    Think about what you will eat, plan ahead. Choose " clean, green, fresh or frozen" over canned, processed or packaged foods which are more sugary, salty and fatty. 70 to 75% of food eaten should be vegetables and fruit. Three meals at set times with snacks allowed between meals, but they must be fruit or vegetables. Aim to eat over a 12 hour period , example 7 am to 7 pm, and STOP after  your last meal of the day. Drink water,generally about 64 ounces per day, no other drink is as healthy. Fruit juice is best enjoyed in a healthy way, by EATING the fruit.  Thanks for choosing [facility], we consider it a privelige to serve you.

## 2022-05-27 NOTE — Progress Notes (Unsigned)
Virtual Visit via telephone Note  I connected with Janet [name] on 05/27/22 at  2:20 PM EDT by  telemedicine application and verified that I am speaking with the correct person using two identifiers.  Location: Patient: home Provider: work   I discussed the limitations of evaluation and management by telemedicine and the availability of in person appointments. The patient expressed understanding and agreed to proceed.  History of Present Illness: Follow up obesity, investigation for recurrent Cushoings, chronic right foot pain Pt has date scheduled for bariatric surgery, and wants my opinion again as to whether I think this is the right option for her Work up for recurrent cushings is negative and complete and Endo has given Surgeon to proceed per pt report She does have recurrent chiari malformation with raised ICP ,however neurosurgeon does not recommend shunt and was recently started on diamox, states this has helped with weight loss Right foot still painful, however she is up and moving around   Observations/Objective: BP 116/89   Wt 243 lb (110.2 kg)   BMI 41.71 kg/m  Good communication with no confusion and intact memory. Alert and oriented x 3 No signs of respiratory distress during speech   Assessment and Plan: Chiari malformation type I ([place]) Followed by Neurology , no shunt per neurosurgery at this point  Intracranial hypertension Managed with diamox per neurology, improved symptom of headache , and has also had some weight loss  Morbid obesity ([place]) Good candidate for bariatric surgery which is scheduled for 06/21/2022, I affirmed that this is her best option   Follow Up Instructions:    I discussed the assessment and treatment plan with the patient. The patient was provided an opportunity to ask questions and all were answered. The patient agreed with the plan and demonstrated an understanding of the instructions.   The patient was advised to call back or  seek an in-person evaluation if the symptoms worsen or if the condition fails to improve as anticipated.  I provided 9 minutes of non-face-to-face time during this encounter.   Tula Nakayama, MD

## 2022-05-28 ENCOUNTER — Encounter: Payer: Self-pay | Admitting: Family Medicine

## 2022-05-28 NOTE — Assessment & Plan Note (Signed)
Followed by Neurology , no shunt per neurosurgery at this point

## 2022-05-28 NOTE — Assessment & Plan Note (Signed)
Managed with diamox per neurology, improved symptom of headache , and has also had some weight loss

## 2022-05-28 NOTE — Assessment & Plan Note (Signed)
Good candidate for bariatric surgery which is scheduled for 06/21/2022, I affirmed that this is her best option

## 2022-05-30 ENCOUNTER — Encounter: Payer: 59 | Attending: General Surgery | Admitting: Skilled Nursing Facility1

## 2022-05-30 ENCOUNTER — Encounter: Payer: Self-pay | Admitting: Skilled Nursing Facility1

## 2022-05-30 NOTE — Progress Notes (Signed)
Pre-Operative Nutrition Class:    Patient was seen on 05/30/2022 for Pre-Operative Bariatric Surgery Education at the [facility].    Surgery date: 06/21/2022 Surgery type: RYGB Start weight at [facility]: 248 Weight today: 242  Samples given per MNT protocol. Patient educated on appropriate usage: Bariatric Advantage Multivitamin Lot # [id number] Exp: 08/24   Bariatric Advantage Calcium  Lot # [id number] Exp: 01/19/2023   Protein Shake Lot # [id number] / [id number] Exp: 10 Jul 2022 /  10 Oct 2022  The following the learning objectives were met by the patient during this course: Identify Pre-Op Dietary Goals and will begin 2 weeks pre-operatively Identify appropriate sources of fluids and proteins  State protein recommendations and appropriate sources pre and post-operatively Identify Post-Operative Dietary Goals and will follow for 2 weeks post-operatively Identify appropriate multivitamin and calcium sources Describe the need for physical activity post-operatively and will follow MD recommendations State when to call healthcare provider regarding medication questions or post-operative complications When having a diagnosis of diabetes understanding hypoglycemia symptoms and the inclusion of 1 complex carbohydrate per meal  Handouts given during class include: Pre-Op Bariatric Surgery Diet Handout Protein Shake Handout Post-Op Bariatric Surgery Nutrition Handout BELT Program Information Flyer Support Group Information Flyer [facility] Bariatric Supplements Price List  Follow-Up Plan: Patient will follow-up at [facility] 2 weeks post operatively for diet advancement per MD.

## 2022-06-10 NOTE — Progress Notes (Signed)
Sent message, via epic in basket, requesting order in epic from surgeon     06/10/22 1415  Preop Orders  Has preop orders? No  Name of staff/physician contacted for orders(Indicate phone or IB message) E. Redmond Pulling, MD.

## 2022-06-14 ENCOUNTER — Ambulatory Visit: Payer: 59 | Admitting: Primary Care

## 2022-06-15 ENCOUNTER — Ambulatory Visit: Payer: Self-pay | Admitting: General Surgery

## 2022-06-15 DIAGNOSIS — D509 Iron deficiency anemia, unspecified: Secondary | ICD-10-CM

## 2022-06-16 NOTE — Patient Instructions (Signed)
DUE TO COVID-19 ONLY TWO VISITORS  (aged 36 and older)  ARE ALLOWED TO COME WITH YOU AND STAY IN THE WAITING ROOM ONLY DURING PRE OP AND PROCEDURE.   **NO VISITORS ARE ALLOWED IN THE SHORT STAY AREA OR RECOVERY ROOM!!**  IF YOU WILL BE ADMITTED INTO THE HOSPITAL YOU ARE ALLOWED ONLY FOUR SUPPORT PEOPLE DURING VISITATION HOURS ONLY (7 AM -8PM)   The support person(s) must pass our screening, gel in and out, and wear a mask at all times, including in the patient's room. Patients must also wear a mask when staff or their support person are in the room. Visitors GUEST BADGE MUST BE WORN VISIBLY  One adult visitor may remain with you overnight and MUST be in the room by 8 P.M.     Your procedure is scheduled on: 06/21/22   Report to [facility] Main Entrance    Report to admitting at : 8:00 AM   Call this number if you have problems the morning of surgery [phone]   Clear liquids from 6 PM the day before surgery until : 7:00 AMDAY OF SURGERY  Water Black Coffee (sugar ok, NO MILK/CREAM OR CREAMERS)  Tea (sugar ok, NO MILK/CREAM OR CREAMERS) regular and decaf                             Plain Jell-O (NO RED)                                           Fruit ices (not with fruit pulp, NO RED)                                     Popsicles (NO RED)                                                                  Juice: apple, WHITE grape, WHITE cranberry Sports drinks like Gatorade (NO RED)               MORNING OF SURGERY DRINK:   DRINK 1 G2 drink BEFORE YOU LEAVE HOME, DRINK ALL OF THE  G2 DRINK AT ONE TIME.   NO SOLID FOOD AFTER 600 PM THE NIGHT BEFORE YOUR SURGERY. YOU MAY DRINK CLEAR FLUIDS. THE G2 DRINK YOU DRINK BEFORE YOU LEAVE HOME WILL BE THE LAST FLUIDS YOU DRINK BEFORE SURGERY.  PAIN IS EXPECTED AFTER SURGERY AND WILL NOT BE COMPLETELY ELIMINATED. AMBULATION AND TYLENOL WILL HELP REDUCE INCISIONAL AND GAS PAIN. MOVEMENT IS KEY!  YOU ARE EXPECTED TO BE OUT OF BED WITHIN  4 HOURS OF ADMISSION TO YOUR PATIENT ROOM.  SITTING IN THE RECLINER THROUGHOUT THE DAY IS IMPORTANT FOR DRINKING FLUIDS AND MOVING GAS THROUGHOUT THE GI TRACT.  COMPRESSION STOCKINGS SHOULD BE WORN [place] UNLESS YOU ARE WALKING.   INCENTIVE SPIROMETER SHOULD BE USED EVERY HOUR WHILE AWAKE TO DECREASE POST-OPERATIVE COMPLICATIONS SUCH AS PNEUMONIA.  WHEN DISCHARGED HOME, IT IS IMPORTANT TO CONTINUE TO WALK EVERY HOUR AND USE THE INCENTIVE SPIROMETER  EVERY HOUR.        The day of surgery:  Drink ONE (1) Pre-Surgery Clear Ensure or G2 at AM the morning of surgery. Drink in one sitting. Do not sip.  This drink was given to you during your hospital  pre-op appointment visit. Nothing else to drink after completing the  Pre-Surgery Clear Ensure or G2.          If you have questions, please contact your surgeon's office.  FOLLOW BOWEL PREP AND ANY ADDITIONAL PRE OP INSTRUCTIONS YOU RECEIVED FROM YOUR SURGEON'S OFFICE!!!    Oral Hygiene is also important to reduce your risk of infection.                                    Remember - BRUSH YOUR TEETH THE MORNING OF SURGERY WITH YOUR REGULAR TOOTHPASTE   Do NOT smoke after Midnight   Take these medicines the morning of surgery with A SIP OF WATER: pantoprazole.  DO NOT TAKE ANY ORAL DIABETIC MEDICATIONS DAY OF YOUR SURGERY  Bring CPAP mask and tubing day of surgery.                              You may not have any metal on your body including hair pins, jewelry, and body piercing             Do not wear make-up, lotions, powders, perfumes/cologne, or deodorant  Do not wear nail polish including gel and S&S, artificial/acrylic nails, or any other type of covering on natural nails including finger and toenails. If you have artificial nails, gel coating, etc. that needs to be removed by a nail salon please have this removed prior to surgery or surgery may need to be canceled/ delayed if the surgeon/ anesthesia feels  like they are unable to be safely monitored.   Do not shave  48 hours prior to surgery.    Do not bring valuables to the hospital. [place].   Contacts, dentures or bridgework may not be worn into surgery.   Bring small overnight bag day of surgery.   DO NOT [place]. PHARMACY WILL DISPENSE MEDICATIONS LISTED ON YOUR MEDICATION LIST TO YOU DURING YOUR ADMISSION [place]!    Patients discharged on the day of surgery will not be allowed to drive home.  Someone NEEDS to stay with you for the first 24 hours after anesthesia.   Special Instructions: Bring a copy of your healthcare power of attorney and living will documents         the day of surgery if you haven't scanned them before.              Please read over the following fact sheets you were given: IF YOU HAVE QUESTIONS ABOUT YOUR PRE-OP INSTRUCTIONS PLEASE CALL [phone]     [facility] - Preparing for Surgery Before surgery, you can play an important role.  Because skin is not sterile, your skin needs to be as free of germs as possible.  You can reduce the number of germs on your skin by washing with CHG (chlorahexidine gluconate) soap before surgery.  CHG is an antiseptic cleaner which kills germs and bonds with the skin to continue  killing germs even after washing. Please DO NOT use if you have an allergy to CHG or antibacterial soaps.  If your skin becomes reddened/irritated stop using the CHG and inform your nurse when you arrive at Short Stay. Do not shave (including legs and underarms) for at least 48 hours prior to the first CHG shower.  You may shave your face/neck. Please follow these instructions carefully:  1.  Shower with CHG Soap the night before surgery and the  morning of Surgery.  2.  If you choose to wash your hair, wash your hair first as usual with your  normal  shampoo.  3.  After you shampoo, rinse your hair and body  thoroughly to remove the  shampoo.                           4.  Use CHG as you would any other liquid soap.  You can apply chg directly  to the skin and wash                       Gently with a scrungie or clean washcloth.  5.  Apply the CHG Soap to your body ONLY FROM THE NECK DOWN.   Do not use on face/ open                           Wound or open sores. Avoid contact with eyes, ears mouth and genitals (private parts).                       Wash face,  Genitals (private parts) with your normal soap.             6.  Wash thoroughly, paying special attention to the area where your surgery  will be performed.  7.  Thoroughly rinse your body with warm water from the neck down.  8.  DO NOT shower/wash with your normal soap after using and rinsing off  the CHG Soap.                9.  Pat yourself dry with a clean towel.            10.  Wear clean pajamas.            11.  Place clean sheets on your bed the night of your first shower and do not  sleep with pets. Day of Surgery : Do not apply any lotions/deodorants the morning of surgery.  Please wear clean clothes to the hospital/surgery center.  FAILURE TO FOLLOW THESE INSTRUCTIONS MAY RESULT IN THE CANCELLATION OF YOUR SURGERY PATIENT SIGNATURE_________________________________  NURSE SIGNATURE__________________________________  ________________________________________________________________________

## 2022-06-17 ENCOUNTER — Encounter (HOSPITAL_COMMUNITY)
Admission: RE | Admit: 2022-06-17 | Discharge: 2022-06-17 | Disposition: A | Discharge status: Home / Self Care | Payer: 59 | Source: Ambulatory Visit | Attending: General Surgery | Admitting: General Surgery

## 2022-06-17 ENCOUNTER — Other Ambulatory Visit: Payer: Self-pay

## 2022-06-17 ENCOUNTER — Encounter (HOSPITAL_COMMUNITY): Payer: Self-pay

## 2022-06-17 VITALS — BP 135/99 | HR 58 | Temp 98.2°F | Ht 64.0 in | Wt 238.0 lb

## 2022-06-17 DIAGNOSIS — Z01812 Encounter for preprocedural laboratory examination: Secondary | ICD-10-CM | POA: Diagnosis present

## 2022-06-17 DIAGNOSIS — R7303 Prediabetes: Secondary | ICD-10-CM | POA: Diagnosis not present

## 2022-06-17 DIAGNOSIS — D509 Iron deficiency anemia, unspecified: Secondary | ICD-10-CM | POA: Insufficient documentation

## 2022-06-17 HISTORY — DX: Unspecified osteoarthritis, unspecified site: M19.90

## 2022-06-17 HISTORY — DX: Sleep apnea, unspecified: G47.30

## 2022-06-17 HISTORY — DX: Prediabetes: R73.03

## 2022-06-17 LAB — CBC WITH DIFFERENTIAL/PLATELET
Abs Immature Granulocytes: 0.02 10*3/uL (ref 0.00–0.07)
Basophils Absolute: 0 10*3/uL (ref 0.0–0.1)
Basophils Relative: 1 %
Eosinophils Absolute: 0.2 10*3/uL (ref 0.0–0.5)
Eosinophils Relative: 2 %
HCT: 37 % (ref 36.0–46.0)
Hemoglobin: 11.7 g/dL — ABNORMAL LOW (ref 12.0–15.0)
Immature Granulocytes: 0 %
Lymphocytes Relative: 32 %
Lymphs Abs: 2.1 10*3/uL (ref 0.7–4.0)
MCH: 26.2 pg (ref 26.0–34.0)
MCHC: 31.6 g/dL (ref 30.0–36.0)
MCV: 82.8 fL (ref 80.0–100.0)
Monocytes Absolute: 0.4 10*3/uL (ref 0.1–1.0)
Monocytes Relative: 6 %
Neutro Abs: 4 10*3/uL (ref 1.7–7.7)
Neutrophils Relative %: 59 %
Platelets: 363 10*3/uL (ref 150–400)
RBC: 4.47 MIL/uL (ref 3.87–5.11)
RDW: 14.2 % (ref 11.5–15.5)
WBC: 6.7 10*3/uL (ref 4.0–10.5)
nRBC: 0 % (ref 0.0–0.2)

## 2022-06-17 LAB — COMPREHENSIVE METABOLIC PANEL
ALT: 11 U/L (ref 0–44)
AST: 15 U/L (ref 15–41)
Albumin: 3.5 g/dL (ref 3.5–5.0)
Alkaline Phosphatase: 60 U/L (ref 38–126)
Anion gap: 3 — ABNORMAL LOW (ref 5–15)
BUN: 9 mg/dL (ref 6–20)
CO2: 26 mmol/L (ref 22–32)
Calcium: 8.7 mg/dL — ABNORMAL LOW (ref 8.9–10.3)
Chloride: 108 mmol/L (ref 98–111)
Creatinine, Ser: 0.79 mg/dL (ref 0.44–1.00)
GFR, Estimated: >60 mL/min (ref >60)
Glucose, Bld: 108 mg/dL — ABNORMAL HIGH (ref 70–99)
Potassium: 3.7 mmol/L (ref 3.5–5.1)
Sodium: 137 mmol/L (ref 135–145)
Total Bilirubin: 0.4 mg/dL (ref 0.3–1.2)
Total Protein: 6.9 g/dL (ref 6.5–8.1)

## 2022-06-17 LAB — GLUCOSE, CAPILLARY: Glucose-Capillary: 99 mg/dL (ref 70–99)

## 2022-06-17 NOTE — Progress Notes (Signed)
For Short Stay: [place] appointment date: Date of COVID positive in last 46 days:  Bowel Prep reminder:   For Anesthesia: PCP - Dr. Tula Nakayama Cardiologist -   Chest x-ray - 01/21/22 EKG - 01/21/22 Stress Test -  ECHO -  Cardiac Cath -  Pacemaker/ICD device last checked: Pacemaker orders received: Device Rep notified:  Spinal Cord Stimulator:  Sleep Study - Yes CPAP - NO  Fasting Blood Sugar - N/A Checks Blood Sugar ___0__ times a day Date and result of last Hgb A1c- 6.1: 03/09/22  Blood Thinner Instructions: Aspirin Instructions: Last Dose:  Activity level: Can go up a flight of stairs and activities of daily living without stopping and without chest pain and/or shortness of breath   Able to exercise without chest pain and/or shortness of breath   Unable to go up a flight of stairs without chest pain and/or shortness of breath     Anesthesia review: Hx: Heart murmur,pre-DIA,OSA(NO CPAP)  Patient denies shortness of breath, fever, cough and chest pain at PAT appointment   Patient verbalized understanding of instructions that were given to them at the PAT appointment. Patient was also instructed that they will need to review over the PAT instructions again at home before surgery.

## 2022-06-21 ENCOUNTER — Other Ambulatory Visit: Payer: Self-pay

## 2022-06-21 ENCOUNTER — Inpatient Hospital Stay (HOSPITAL_COMMUNITY): Payer: 59 | Admitting: Certified Registered Nurse Anesthetist

## 2022-06-21 ENCOUNTER — Inpatient Hospital Stay (HOSPITAL_COMMUNITY)
Admission: RE | Admit: 2022-06-21 | Discharge: 2022-06-23 | DRG: 619 | Disposition: A | Discharge status: Home / Self Care | Payer: 59 | Attending: General Surgery | Admitting: General Surgery

## 2022-06-21 ENCOUNTER — Encounter (HOSPITAL_COMMUNITY)
Admission: RE | Disposition: A | Discharge status: Home / Self Care | Payer: Self-pay | Source: Home / Self Care | Attending: General Surgery

## 2022-06-21 ENCOUNTER — Encounter (HOSPITAL_COMMUNITY): Payer: Self-pay | Admitting: General Surgery

## 2022-06-21 DIAGNOSIS — E249 Cushing's syndrome, unspecified: Secondary | ICD-10-CM | POA: Diagnosis present

## 2022-06-21 DIAGNOSIS — Z881 Allergy status to other antibiotic agents status: Secondary | ICD-10-CM

## 2022-06-21 DIAGNOSIS — Z6841 Body Mass Index (BMI) 40.0 and over, adult: Secondary | ICD-10-CM

## 2022-06-21 DIAGNOSIS — D509 Iron deficiency anemia, unspecified: Secondary | ICD-10-CM

## 2022-06-21 DIAGNOSIS — Z8371 Family history of colonic polyps: Secondary | ICD-10-CM

## 2022-06-21 DIAGNOSIS — M1990 Unspecified osteoarthritis, unspecified site: Secondary | ICD-10-CM | POA: Diagnosis present

## 2022-06-21 DIAGNOSIS — G935 Compression of brain: Secondary | ICD-10-CM | POA: Diagnosis present

## 2022-06-21 DIAGNOSIS — G4733 Obstructive sleep apnea (adult) (pediatric): Secondary | ICD-10-CM

## 2022-06-21 DIAGNOSIS — Z833 Family history of diabetes mellitus: Secondary | ICD-10-CM | POA: Diagnosis not present

## 2022-06-21 DIAGNOSIS — I10 Essential (primary) hypertension: Secondary | ICD-10-CM

## 2022-06-21 DIAGNOSIS — K769 Liver disease, unspecified: Secondary | ICD-10-CM | POA: Diagnosis present

## 2022-06-21 DIAGNOSIS — Z825 Family history of asthma and other chronic lower respiratory diseases: Secondary | ICD-10-CM | POA: Diagnosis not present

## 2022-06-21 DIAGNOSIS — Z9884 Bariatric surgery status: Principal | ICD-10-CM

## 2022-06-21 DIAGNOSIS — E559 Vitamin D deficiency, unspecified: Secondary | ICD-10-CM

## 2022-06-21 DIAGNOSIS — Z823 Family history of stroke: Secondary | ICD-10-CM

## 2022-06-21 DIAGNOSIS — Z888 Allergy status to other drugs, medicaments and biological substances status: Secondary | ICD-10-CM

## 2022-06-21 DIAGNOSIS — Z91040 Latex allergy status: Secondary | ICD-10-CM

## 2022-06-21 DIAGNOSIS — R7303 Prediabetes: Secondary | ICD-10-CM | POA: Diagnosis present

## 2022-06-21 DIAGNOSIS — K449 Diaphragmatic hernia without obstruction or gangrene: Secondary | ICD-10-CM | POA: Diagnosis present

## 2022-06-21 DIAGNOSIS — Z83438 Family history of other disorder of lipoprotein metabolism and other lipidemia: Secondary | ICD-10-CM | POA: Diagnosis not present

## 2022-06-21 DIAGNOSIS — Z8249 Family history of ischemic heart disease and other diseases of the circulatory system: Secondary | ICD-10-CM | POA: Diagnosis not present

## 2022-06-21 DIAGNOSIS — K219 Gastro-esophageal reflux disease without esophagitis: Secondary | ICD-10-CM | POA: Diagnosis present

## 2022-06-21 DIAGNOSIS — Z880 Allergy status to penicillin: Secondary | ICD-10-CM | POA: Diagnosis not present

## 2022-06-21 HISTORY — PX: GASTRIC ROUX-EN-Y: SHX5262

## 2022-06-21 LAB — GLUCOSE, CAPILLARY
Glucose-Capillary: 82 mg/dL (ref 70–99)
Glucose-Capillary: 163 mg/dL — ABNORMAL HIGH (ref 70–99)

## 2022-06-21 LAB — CBC
HCT: 36.9 % (ref 36.0–46.0)
Hemoglobin: 11.9 g/dL — ABNORMAL LOW (ref 12.0–15.0)
MCH: 26.7 pg (ref 26.0–34.0)
MCHC: 32.2 g/dL (ref 30.0–36.0)
MCV: 82.9 fL (ref 80.0–100.0)
Platelets: 313 10*3/uL (ref 150–400)
RBC: 4.45 MIL/uL (ref 3.87–5.11)
RDW: 14.3 % (ref 11.5–15.5)
WBC: 12 10*3/uL — ABNORMAL HIGH (ref 4.0–10.5)
nRBC: 0 % (ref 0.0–0.2)

## 2022-06-21 LAB — TYPE AND SCREEN
ABO/RH(D): B POS
Antibody Screen: NEGATIVE

## 2022-06-21 LAB — POCT PREGNANCY, URINE: Preg Test, Ur: NEGATIVE

## 2022-06-21 LAB — CREATININE, SERUM
Creatinine, Ser: 0.73 mg/dL (ref 0.44–1.00)
GFR, Estimated: >60 mL/min (ref >60)

## 2022-06-21 SURGERY — LAPAROSCOPIC ROUX-EN-Y GASTRIC BYPASS WITH UPPER ENDOSCOPY
Anesthesia: General | Site: Abdomen

## 2022-06-21 MED ORDER — "VISTASEAL 4 ML SINGLE DOSE KIT "
4.0000 mL | PACK | Freq: Once | CUTANEOUS | Status: AC
  Administered 2022-06-21: 4 mL via TOPICAL
  Filled 2022-06-21: qty 4

## 2022-06-21 MED ORDER — FENTANYL CITRATE PF 50 MCG/ML IJ SOSY: 25.0000 ug | PREFILLED_SYRINGE | INTRAMUSCULAR | Status: DC | PRN

## 2022-06-21 MED ORDER — KCL IN DEXTROSE-NACL 20-5-0.45 MEQ/L-%-% IV SOLN
INTRAVENOUS | Status: DC
  Filled 2022-06-21 (×6): qty 1000

## 2022-06-21 MED ORDER — PHENYLEPHRINE 80 MCG/ML (10ML) SYRINGE FOR IV PUSH (FOR BLOOD PRESSURE SUPPORT)
PREFILLED_SYRINGE | INTRAVENOUS | Status: DC | PRN
  Administered 2022-06-21 (×4): 120 ug via INTRAVENOUS
  Administered 2022-06-21: 80 ug via INTRAVENOUS

## 2022-06-21 MED ORDER — SIMETHICONE 80 MG PO CHEW
80.0000 mg | CHEWABLE_TABLET | Freq: Four times a day (QID) | ORAL | Status: DC | PRN
  Administered 2022-06-22: 80 mg via ORAL
  Filled 2022-06-21: qty 1

## 2022-06-21 MED ORDER — HEPARIN SODIUM (PORCINE) 5000 UNIT/ML IJ SOLN
5000.0000 [IU] | Freq: Three times a day (TID) | INTRAMUSCULAR | Status: DC
  Administered 2022-06-21 – 2022-06-23 (×5): 5000 [IU] via SUBCUTANEOUS
  Filled 2022-06-21 (×5): qty 1

## 2022-06-21 MED ORDER — DIPHENHYDRAMINE HCL 50 MG/ML IJ SOLN: 12.5000 mg | Freq: Three times a day (TID) | INTRAMUSCULAR | Status: DC | PRN

## 2022-06-21 MED ORDER — LIDOCAINE HCL (PF) 2 % IJ SOLN
INTRAMUSCULAR | Status: DC | PRN
  Administered 2022-06-21: 1.5 mg/kg/h

## 2022-06-21 MED ORDER — FENTANYL CITRATE (PF) 250 MCG/5ML IJ SOLN
INTRAMUSCULAR | Status: AC
  Filled 2022-06-21: qty 5

## 2022-06-21 MED ORDER — ROCURONIUM BROMIDE 10 MG/ML (PF) SYRINGE
PREFILLED_SYRINGE | INTRAVENOUS | Status: AC
  Filled 2022-06-21: qty 10

## 2022-06-21 MED ORDER — HYDRALAZINE HCL 20 MG/ML IJ SOLN: 10.0000 mg | INTRAMUSCULAR | Status: DC | PRN

## 2022-06-21 MED ORDER — ENSURE MAX PROTEIN PO LIQD
2.0000 [oz_av] | ORAL | Status: DC
  Administered 2022-06-22 – 2022-06-23 (×8): 2 [oz_av] via ORAL

## 2022-06-21 MED ORDER — PROPOFOL 10 MG/ML IV BOLUS
INTRAVENOUS | Status: AC
  Filled 2022-06-21: qty 20

## 2022-06-21 MED ORDER — SUGAMMADEX SODIUM 500 MG/5ML IV SOLN
INTRAVENOUS | Status: AC
  Filled 2022-06-21: qty 5

## 2022-06-21 MED ORDER — LACTATED RINGERS IR SOLN
Status: DC | PRN
  Administered 2022-06-21: 1000 mL

## 2022-06-21 MED ORDER — LACTATED RINGERS IV SOLN: INTRAVENOUS | Status: DC

## 2022-06-21 MED ORDER — PROCHLORPERAZINE EDISYLATE 10 MG/2ML IJ SOLN
10.0000 mg | Freq: Four times a day (QID) | INTRAMUSCULAR | Status: DC | PRN
  Administered 2022-06-21 (×2): 10 mg via INTRAVENOUS
  Filled 2022-06-21 (×2): qty 2

## 2022-06-21 MED ORDER — FIBRIN SEALANT 2 ML SINGLE DOSE KIT
2.0000 mL | PACK | Freq: Once | CUTANEOUS | Status: AC
  Administered 2022-06-21: 2 mL via TOPICAL
  Filled 2022-06-21: qty 2

## 2022-06-21 MED ORDER — FENTANYL CITRATE (PF) 250 MCG/5ML IJ SOLN
INTRAMUSCULAR | Status: DC | PRN
  Administered 2022-06-21: 100 ug via INTRAVENOUS
  Administered 2022-06-21: 150 ug via INTRAVENOUS
  Administered 2022-06-21: 50 ug via INTRAVENOUS

## 2022-06-21 MED ORDER — ONDANSETRON HCL 4 MG/2ML IJ SOLN
INTRAMUSCULAR | Status: DC | PRN
  Administered 2022-06-21: 4 mg via INTRAVENOUS

## 2022-06-21 MED ORDER — FENTANYL CITRATE (PF) 100 MCG/2ML IJ SOLN
INTRAMUSCULAR | Status: AC
  Filled 2022-06-21: qty 2

## 2022-06-21 MED ORDER — OXYCODONE HCL 5 MG/5ML PO SOLN: 5.0000 mg | Freq: Once | ORAL | Status: DC | PRN

## 2022-06-21 MED ORDER — 0.9 % SODIUM CHLORIDE (POUR BTL) OPTIME
TOPICAL | Status: DC | PRN
  Administered 2022-06-21: 1000 mL

## 2022-06-21 MED ORDER — LIDOCAINE 2% (20 MG/ML) 5 ML SYRINGE
INTRAMUSCULAR | Status: DC | PRN
  Administered 2022-06-21: 60 mg via INTRAVENOUS

## 2022-06-21 MED ORDER — ONDANSETRON HCL 4 MG/2ML IJ SOLN
INTRAMUSCULAR | Status: AC
  Filled 2022-06-21: qty 2

## 2022-06-21 MED ORDER — BUPIVACAINE-EPINEPHRINE 0.25% -1:200000 IJ SOLN
INTRAMUSCULAR | Status: DC | PRN
  Administered 2022-06-21: 30 mL

## 2022-06-21 MED ORDER — CHLORHEXIDINE GLUCONATE 4 % EX LIQD: Freq: Once | CUTANEOUS | Status: DC

## 2022-06-21 MED ORDER — ACETAMINOPHEN 500 MG PO TABS
1000.0000 mg | ORAL_TABLET | Freq: Three times a day (TID) | ORAL | Status: DC
  Administered 2022-06-21 – 2022-06-23 (×5): 1000 mg via ORAL
  Filled 2022-06-21 (×5): qty 2

## 2022-06-21 MED ORDER — PANTOPRAZOLE SODIUM 40 MG IV SOLR
40.0000 mg | Freq: Every day | INTRAVENOUS | Status: DC
  Administered 2022-06-21 – 2022-06-22 (×2): 40 mg via INTRAVENOUS
  Filled 2022-06-21 (×2): qty 10

## 2022-06-21 MED ORDER — PHENYLEPHRINE 80 MCG/ML (10ML) SYRINGE FOR IV PUSH (FOR BLOOD PRESSURE SUPPORT)
PREFILLED_SYRINGE | INTRAVENOUS | Status: AC
  Filled 2022-06-21: qty 10

## 2022-06-21 MED ORDER — ROCURONIUM BROMIDE 10 MG/ML (PF) SYRINGE
PREFILLED_SYRINGE | INTRAVENOUS | Status: DC | PRN
  Administered 2022-06-21: 40 mg via INTRAVENOUS
  Administered 2022-06-21: 60 mg via INTRAVENOUS

## 2022-06-21 MED ORDER — PROMETHAZINE HCL 25 MG/ML IJ SOLN: 6.2500 mg | INTRAMUSCULAR | Status: DC | PRN

## 2022-06-21 MED ORDER — BUPIVACAINE LIPOSOME 1.3 % IJ SUSP
INTRAMUSCULAR | Status: AC
  Filled 2022-06-21: qty 20

## 2022-06-21 MED ORDER — BUPIVACAINE LIPOSOME 1.3 % IJ SUSP: 20.0000 mL | Freq: Once | INTRAMUSCULAR | Status: DC

## 2022-06-21 MED ORDER — AMISULPRIDE (ANTIEMETIC) 5 MG/2ML IV SOLN: 10.0000 mg | Freq: Once | INTRAVENOUS | Status: DC | PRN

## 2022-06-21 MED ORDER — CLINDAMYCIN PHOSPHATE 900 MG/50ML IV SOLN
900.0000 mg | INTRAVENOUS | Status: AC
  Administered 2022-06-21: 900 mg via INTRAVENOUS
  Filled 2022-06-21: qty 50

## 2022-06-21 MED ORDER — GENTAMICIN SULFATE 40 MG/ML IJ SOLN
5.0000 mg/kg | INTRAVENOUS | Status: AC
  Administered 2022-06-21: 380 mg via INTRAVENOUS
  Filled 2022-06-21: qty 9.5

## 2022-06-21 MED ORDER — ONDANSETRON HCL 4 MG/2ML IJ SOLN
4.0000 mg | Freq: Four times a day (QID) | INTRAMUSCULAR | Status: DC | PRN
  Administered 2022-06-22 – 2022-06-23 (×3): 4 mg via INTRAVENOUS
  Filled 2022-06-21 (×3): qty 2

## 2022-06-21 MED ORDER — GENTAMICIN SULFATE 40 MG/ML IJ SOLN: 1.5000 mg/kg | INTRAVENOUS | Status: DC

## 2022-06-21 MED ORDER — OXYCODONE HCL 5 MG PO TABS: 5.0000 mg | ORAL_TABLET | Freq: Once | ORAL | Status: DC | PRN

## 2022-06-21 MED ORDER — APREPITANT 40 MG PO CAPS
40.0000 mg | ORAL_CAPSULE | ORAL | Status: AC
  Administered 2022-06-21: 40 mg via ORAL
  Filled 2022-06-21: qty 1

## 2022-06-21 MED ORDER — BUPIVACAINE LIPOSOME 1.3 % IJ SUSP
INTRAMUSCULAR | Status: DC | PRN
  Administered 2022-06-21: 20 mL

## 2022-06-21 MED ORDER — MIDAZOLAM HCL 2 MG/2ML IJ SOLN
INTRAMUSCULAR | Status: AC
  Filled 2022-06-21: qty 2

## 2022-06-21 MED ORDER — HEPARIN SODIUM (PORCINE) 5000 UNIT/ML IJ SOLN
5000.0000 [IU] | INTRAMUSCULAR | Status: AC
  Administered 2022-06-21: 5000 [IU] via SUBCUTANEOUS
  Filled 2022-06-21: qty 1

## 2022-06-21 MED ORDER — MORPHINE SULFATE (PF) 2 MG/ML IV SOLN: 1.0000 mg | INTRAVENOUS | Status: DC | PRN

## 2022-06-21 MED ORDER — SUGAMMADEX SODIUM 200 MG/2ML IV SOLN
INTRAVENOUS | Status: DC | PRN
  Administered 2022-06-21: 300 mg via INTRAVENOUS

## 2022-06-21 MED ORDER — ACETAMINOPHEN 160 MG/5ML PO SOLN: 1000.0000 mg | Freq: Three times a day (TID) | ORAL | Status: DC

## 2022-06-21 MED ORDER — ORAL CARE MOUTH RINSE: 15.0000 mL | Freq: Once | OROMUCOSAL | Status: AC

## 2022-06-21 MED ORDER — DEXAMETHASONE SODIUM PHOSPHATE 4 MG/ML IJ SOLN
4.0000 mg | INTRAMUSCULAR | Status: AC
  Administered 2022-06-21: 4 mg via INTRAVENOUS

## 2022-06-21 MED ORDER — LIDOCAINE HCL 2 % IJ SOLN
INTRAMUSCULAR | Status: AC
  Filled 2022-06-21: qty 20

## 2022-06-21 MED ORDER — SCOPOLAMINE 1 MG/3DAYS TD PT72
1.0000 | MEDICATED_PATCH | TRANSDERMAL | Status: DC
  Administered 2022-06-21: 1.5 mg via TRANSDERMAL
  Filled 2022-06-21: qty 1

## 2022-06-21 MED ORDER — OXYCODONE HCL 5 MG/5ML PO SOLN
5.0000 mg | Freq: Four times a day (QID) | ORAL | Status: DC | PRN
  Administered 2022-06-21 – 2022-06-22 (×2): 5 mg via ORAL
  Filled 2022-06-21 (×2): qty 5

## 2022-06-21 MED ORDER — DEXAMETHASONE SODIUM PHOSPHATE 10 MG/ML IJ SOLN
INTRAMUSCULAR | Status: AC
  Filled 2022-06-21: qty 1

## 2022-06-21 MED ORDER — MIDAZOLAM HCL 5 MG/5ML IJ SOLN
INTRAMUSCULAR | Status: DC | PRN
  Administered 2022-06-21: 2 mg via INTRAVENOUS

## 2022-06-21 MED ORDER — ACETAMINOPHEN 500 MG PO TABS
1000.0000 mg | ORAL_TABLET | ORAL | Status: AC
  Administered 2022-06-21: 1000 mg via ORAL
  Filled 2022-06-21: qty 2

## 2022-06-21 MED ORDER — PROPOFOL 10 MG/ML IV BOLUS
INTRAVENOUS | Status: DC | PRN
  Administered 2022-06-21: 150 mg via INTRAVENOUS

## 2022-06-21 MED ORDER — BUPIVACAINE-EPINEPHRINE (PF) 0.25% -1:200000 IJ SOLN
INTRAMUSCULAR | Status: AC
  Filled 2022-06-21: qty 30

## 2022-06-21 MED ORDER — KETAMINE HCL 10 MG/ML IJ SOLN
INTRAMUSCULAR | Status: DC | PRN
  Administered 2022-06-21: 30 mg via INTRAVENOUS

## 2022-06-21 MED ORDER — CHLORHEXIDINE GLUCONATE 0.12 % MT SOLN
15.0000 mL | Freq: Once | OROMUCOSAL | Status: AC
  Administered 2022-06-21: 15 mL via OROMUCOSAL

## 2022-06-21 SURGICAL SUPPLY — 86 items
APL LAPSCP 35 DL APL RGD (MISCELLANEOUS) ×2
APL PRP STRL LF DISP 70% ISPRP (MISCELLANEOUS) ×2
APL SWBSTK 6 STRL LF DISP (MISCELLANEOUS)
APPLICATOR COTTON TIP 6 STRL (MISCELLANEOUS) IMPLANT
APPLICATOR COTTON TIP 6IN STRL (MISCELLANEOUS)
APPLICATOR VISTASEAL 35 (MISCELLANEOUS) ×2 IMPLANT
APPLIER CLIP ROT 13.4 12 LRG (CLIP)
APR CLP LRG 13.4X12 ROT 20 MLT (CLIP)
BAG COUNTER SPONGE SURGICOUNT (BAG) IMPLANT
BAG SPNG CNTER NS LX DISP (BAG) ×1
BLADE SURG SZ11 CARB STEEL (BLADE) ×1 IMPLANT
CABLE HIGH FREQUENCY MONO STRZ (ELECTRODE) IMPLANT
CHLORAPREP W/TINT 26 (MISCELLANEOUS) ×2 IMPLANT
CLIP APPLIE ROT 13.4 12 LRG (CLIP) IMPLANT
CLIP SUT LAPRA TY ABSORB (SUTURE) ×2 IMPLANT
CUTTER FLEX LINEAR 45M (STAPLE) IMPLANT
DEVICE SUT QUICK LOAD TK 5 (SUTURE) IMPLANT
DEVICE SUT TI-KNOT TK 5X26 (SUTURE) IMPLANT
DEVICE SUTURE ENDOST 10MM (ENDOMECHANICALS) ×1 IMPLANT
DRAIN PENROSE 0.25X18 (DRAIN) ×1 IMPLANT
DRSG TEGADERM 2-3/8X2-3/4 SM (GAUZE/BANDAGES/DRESSINGS) ×6 IMPLANT
ELECT REM PT RETURN 15FT ADLT (MISCELLANEOUS) ×1 IMPLANT
GAUZE 4X4 16PLY ~~LOC~~+RFID DBL (SPONGE) ×1 IMPLANT
GAUZE SPONGE 2X2 8PLY STRL LF (GAUZE/BANDAGES/DRESSINGS) ×1 IMPLANT
GAUZE SPONGE 4X4 12PLY STRL (GAUZE/BANDAGES/DRESSINGS) IMPLANT
GLOVE BIO SURGEON STRL SZ7.5 (GLOVE) ×1 IMPLANT
GLOVE INDICATOR 8.0 STRL GRN (GLOVE) ×1 IMPLANT
GOWN STRL REUS W/ TWL XL LVL3 (GOWN DISPOSABLE) ×4 IMPLANT
GOWN STRL REUS W/TWL XL LVL3 (GOWN DISPOSABLE) ×4
IRRIG SUCT STRYKERFLOW 2 WTIP (MISCELLANEOUS) ×1
IRRIGATION SUCT STRKRFLW 2 WTP (MISCELLANEOUS) ×1 IMPLANT
KIT BASIN OR (CUSTOM PROCEDURE TRAY) ×1 IMPLANT
KIT GASTRIC LAVAGE 34FR ADT (SET/KITS/TRAYS/PACK) ×1 IMPLANT
KIT TURNOVER KIT A (KITS) IMPLANT
MARKER SKIN DUAL TIP RULER LAB (MISCELLANEOUS) ×1 IMPLANT
MAT PREVALON FULL STRYKER (MISCELLANEOUS) ×1 IMPLANT
NDL SPNL 22GX3.5 QUINCKE BK (NEEDLE) ×1 IMPLANT
NEEDLE SPNL 22GX3.5 QUINCKE BK (NEEDLE) ×1 IMPLANT
PACK CARDIOVASCULAR III (CUSTOM PROCEDURE TRAY) ×1 IMPLANT
PENCIL SMOKE EVACUATOR (MISCELLANEOUS) IMPLANT
RELOAD 45 VASCULAR/THIN (ENDOMECHANICALS) IMPLANT
RELOAD ENDO STITCH 2.0 (ENDOMECHANICALS) ×9
RELOAD STAPLE 45 2.5 WHT GRN (ENDOMECHANICALS) IMPLANT
RELOAD STAPLE 45 3.5 BLU ETS (ENDOMECHANICALS) IMPLANT
RELOAD STAPLE 60 2.6 WHT THN (STAPLE) ×2 IMPLANT
RELOAD STAPLE 60 3.6 BLU REG (STAPLE) ×2 IMPLANT
RELOAD STAPLE 60 3.8 GOLD REG (STAPLE) ×1 IMPLANT
RELOAD STAPLE TA45 3.5 REG BLU (ENDOMECHANICALS) IMPLANT
RELOAD STAPLER BLUE 60MM (STAPLE) ×6 IMPLANT
RELOAD STAPLER GOLD 60MM (STAPLE) ×1 IMPLANT
RELOAD STAPLER WHITE 60MM (STAPLE) ×2 IMPLANT
RELOAD SUT SNGL STCH ABSRB 2-0 (ENDOMECHANICALS) ×5 IMPLANT
RELOAD SUT SNGL STCH BLK 2-0 (ENDOMECHANICALS) ×4 IMPLANT
SCISSORS LAP 5X45 EPIX DISP (ENDOMECHANICALS) ×1 IMPLANT
SET TUBE SMOKE EVAC HIGH FLOW (TUBING) ×1 IMPLANT
SHEARS HARMONIC ACE PLUS 45CM (MISCELLANEOUS) ×1 IMPLANT
SLEEVE ADV FIXATION 12X100MM (TROCAR) ×2 IMPLANT
SLEEVE ADV FIXATION 5X100MM (TROCAR) IMPLANT
SOL ANTI FOG 6CC (MISCELLANEOUS) ×1 IMPLANT
SOLUTION ANTI FOG 6CC (MISCELLANEOUS) ×1
STAPLE LINE REINFORCEMENT LAP (STAPLE) IMPLANT
STAPLER ECHELON BIOABSB 60 FLE (MISCELLANEOUS) IMPLANT
STAPLER ECHELON LONG 60 440 (INSTRUMENTS) ×1 IMPLANT
STAPLER RELOAD BLUE 60MM (STAPLE) ×6
STAPLER RELOAD GOLD 60MM (STAPLE) ×1
STAPLER RELOAD WHITE 60MM (STAPLE) ×2
STRIP CLOSURE SKIN 1/2X4 (GAUZE/BANDAGES/DRESSINGS) ×1 IMPLANT
SURGILUBE 2OZ TUBE FLIPTOP (MISCELLANEOUS) ×1 IMPLANT
SUT MNCRL AB 4-0 PS2 18 (SUTURE) ×1 IMPLANT
SUT RELOAD ENDO STITCH 2 48X1 (ENDOMECHANICALS) ×5
SUT RELOAD ENDO STITCH 2.0 (ENDOMECHANICALS) ×4
SUT SURGIDAC NAB ES-9 0 48 120 (SUTURE) IMPLANT
SUT VIC AB 2-0 SH 27 (SUTURE) ×1
SUT VIC AB 2-0 SH 27X BRD (SUTURE) ×1 IMPLANT
SUTURE RELOAD END STTCH 2 48X1 (ENDOMECHANICALS) ×5 IMPLANT
SUTURE RELOAD ENDO STITCH 2.0 (ENDOMECHANICALS) ×4 IMPLANT
SYR 20ML LL LF (SYRINGE) ×2 IMPLANT
TOWEL OR 17X26 10 PK STRL BLUE (TOWEL DISPOSABLE) ×1 IMPLANT
TOWEL OR NON WOVEN STRL DISP B (DISPOSABLE) ×1 IMPLANT
TRAY FOLEY MTR SLVR 16FR STAT (SET/KITS/TRAYS/PACK) IMPLANT
TROCAR 5M 150ML BLDLS (TROCAR) IMPLANT
TROCAR ADV FIXATION 12X100MM (TROCAR) ×1 IMPLANT
TROCAR ADV FIXATION 5X100MM (TROCAR) ×1 IMPLANT
TROCAR XCEL NON-BLD 5MMX100MML (ENDOMECHANICALS) IMPLANT
TROCAR Z-THREAD OPTICAL 5X100M (TROCAR) ×1 IMPLANT
TUBING CONNECTING 10 (TUBING) ×2 IMPLANT

## 2022-06-21 NOTE — Anesthesia Preprocedure Evaluation (Signed)
Anesthesia Evaluation  Patient identified by MRN, date of birth, ID band Patient awake    Reviewed: Allergy & Precautions, NPO status , Patient's Chart, lab work & pertinent test results  Airway Mallampati: II  TM Distance: >3 FB Neck ROM: Full    Dental no notable dental hx.    Pulmonary sleep apnea ,    Pulmonary exam normal        Cardiovascular hypertension, Normal cardiovascular exam     Neuro/Psych  Headaches,  Neuromuscular disease negative psych ROS   GI/Hepatic Neg liver ROS, hiatal hernia, GERD  Medicated and Controlled,  Endo/Other  Morbid obesity  Renal/GU negative Renal ROS     Musculoskeletal  (+) Arthritis ,   Abdominal (+) + obese,   Peds  Hematology negative hematology ROS (+)   Anesthesia Other Findings MORBID OBESITY  Reproductive/Obstetrics hcg negative                             Anesthesia Physical Anesthesia Plan  ASA: 3  Anesthesia Plan: General   Post-op Pain Management:    Induction: Intravenous  PONV Risk Score and Plan: 4 or greater and Ondansetron, Dexamethasone, Midazolam, Scopolamine patch - Pre-op and Treatment may vary due to age or medical condition  Airway Management Planned: Oral ETT  Additional Equipment:   Intra-op Plan:   Post-operative Plan: Extubation in OR  Informed Consent: I have reviewed the patients History and Physical, chart, labs and discussed the procedure including the risks, benefits and alternatives for the proposed anesthesia with the patient or authorized representative who has indicated his/her understanding and acceptance.     Dental advisory given  Plan Discussed with: CRNA  Anesthesia Plan Comments:         Anesthesia Quick Evaluation

## 2022-06-21 NOTE — Anesthesia Postprocedure Evaluation (Signed)
Anesthesia Post Note  Patient: Janet [name]  Procedure(s) Performed: LAPAROSCOPIC ROUX-EN-Y GASTRIC BYPASS WITH UPPER ENDOSCOPY WITH HIATAL HERNIA REPAIR (Abdomen)     Patient location during evaluation: PACU Anesthesia Type: General Level of consciousness: awake Pain management: pain level controlled Vital Signs Assessment: post-procedure vital signs reviewed and stable Respiratory status: spontaneous breathing, nonlabored ventilation, respiratory function stable and patient connected to nasal cannula oxygen Cardiovascular status: blood pressure returned to baseline and stable Postop Assessment: no apparent nausea or vomiting Anesthetic complications: no   No notable events documented.  Last Vitals:  Vitals:   06/21/22 1736 06/21/22 1832  BP: 116/76 122/74  Pulse: 74 66  Resp: 18 18  Temp: 36.4 C 36.7 C  SpO2: 100% 100%    Last Pain:  Vitals:   06/21/22 1832  TempSrc: Oral  PainSc:                  Karyl Kinnier Jeray Shugart

## 2022-06-21 NOTE — Transfer of Care (Signed)
Immediate Anesthesia Transfer of Care Note  Patient: Janet [name]  Procedure(s) Performed: LAPAROSCOPIC ROUX-EN-Y GASTRIC BYPASS WITH UPPER ENDOSCOPY WITH HIATAL HERNIA REPAIR (Abdomen)  Patient Location: PACU  Anesthesia Type:General  Level of Consciousness: drowsy and patient cooperative  Airway & Oxygen Therapy: Patient Spontanous Breathing and Patient connected to face mask  Post-op Assessment: Report given to RN and Post -op Vital signs reviewed and stable  Post vital signs: Reviewed and stable  Last Vitals:  Vitals Value Taken Time  BP 129/81 06/21/22 1225  Temp 36.2 C 06/21/22 1225  Pulse 80 06/21/22 1225  Resp 19 06/21/22 1225  SpO2 99 % 06/21/22 1225    Last Pain:  Vitals:   06/21/22 0845  TempSrc:   PainSc: 0-No pain         Complications: No notable events documented.

## 2022-06-21 NOTE — Op Note (Signed)
CASSANDRE OLEKSY [id number] 1986-01-04. 06/21/2022  Preoperative diagnosis:  Severe obesity (BMI 41)  Gastroesophageal reflux disease, unspecified whether esophagitis present  Chiari malformation type I (CMS-HCC)  Essential hypertension  Vitamin D deficiency  Mild obstructive sleep apnea  Chronic nonintractable headache, unspecified headache type  Prediabetes    Postoperative  diagnosis:  1. Same + small sliding hiatal hernia + posterior left hepatic mass  Surgical procedure: Laparoscopic Roux-en-Y gastric bypass (ante-colic, ante-gastric) with sliding hiatal hernia repair; upper endoscopy  Surgeon: Gayland Curry, M.D. FACS  Asst.: Johnathan Hausen MD FACS  Anesthesia: General plus exparel/marcaine mix  Complications: None   EBL: Minimal   Drains: None   Disposition: PACU in good condition   Indications for procedure: 36 y.o. yo female with morbid obesity who has been unsuccessful at sustained weight loss. The patient's comorbidities are listed above. We discussed the risk and benefits of surgery including but not limited to anesthesia risk, bleeding, infection, blood clot formation, anastomotic leak, anastomotic stricture, ulcer formation, death, respiratory complications, intestinal blockage, internal hernia, gallstone formation, vitamin and nutritional deficiencies, injury to surrounding structures, failure to lose weight and mood changes.   Description of procedure: Patient is brought to the operating room and general anesthesia induced. The patient had received preoperative broad-spectrum IV antibiotics and subcutaneous heparin. The abdomen was widely sterilely prepped with Chloraprep and draped. Patient timeout was performed and correct patient and procedure confirmed. Access was obtained with a 5 mm Optiview trocar in the left upper quadrant and pneumoperitoneum established without difficulty. Under direct vision 12 mm trocars were placed laterally in the right upper  quadrant, right upper quadrant midclavicular line, and to the left and above the umbilicus for the camera port. A 5 mm trocar was placed laterally in the left upper quadrant.  Exparel/marcaine mix was infiltrated in bilateral lateral abdominal walls as a TAP block for postoperative pain relief.  The omentum was brought into the upper abdomen and the transverse mesocolon elevated and the ligament of Treitz clearly identified. A 50 cm biliopancreatic limb was then carefully measured from the ligament of Treitz. The small intestine was divided at this point with a single firing of the white load linear stapler. A Penrose drain was sutured to the end of the Roux-en-Y limb for later identification. A 100 cm Roux-en-Y limb was then carefully measured. At this point a side-to-side anastomosis was created between the Roux limb and the end of the biliopancreatic limb. This was accomplished with a single firing of the 60 mm white load linear stapler. The common enterotomy was closed with a running 2-0 Vicryl begun at either end of the enterotomy and tied centrally. Vistaseal tissue sealant was placed over the anastomosis. The mesenteric defect was then closed with running 2-0 silk. The omentum was then divided with the harmonic scalpel up towards the transverse colon to allow mobility of the Roux limb toward the gastric pouch. The patient was then placed in steep reversed Trendelenburg. Through a 5 mm subxiphoid site the [facility] retractor was placed and the left lobe of the liver elevated with excellent exposure of the upper stomach and hiatus.  The patient had a mass in the left hepatic lobe posteriorly.  It appeared benign.  It was smooth and symmetrical.  There were no other abnormalities noted in the liver.  There is no studding of the omentum or peritoneum.  I felt it was safe to proceed.   Patient had a small dimple anteriorly at her GE junction and the  diaphragm.  Her preoperative upper GI suggested a small sliding  hiatal hernia.  The gastrohepatic ligament was incised with a Monarch scalpel.  The right crus of the diaphragm was visualized.  I gently incised the peritoneum overlying it with harmonic scalpel.  With blunt dissection I was able to identify the confluence of the left and right.  There was a small gap between the 2 crura consistent with a small sliding hiatal hernia.  I repaired it with a single Ethibond Surgidac suture using a titanium tie knot.   The angle of Hiss was then mobilized with the harmonic scalpel. A 5 cm gastric pouch was then carefully measured along the lesser curve of the stomach. Dissection was carried along the lesser curve at this point with the Harmonic scalpel working carefully back toward the lesser sac at right angles to the lesser curve. The free lesser sac was then entered.  The patient did not have any adhesions in the lesser sac even though she had had a prior left laparoscopic adrenalectomy.  After being sure all tubes were removed from the stomach an initial firing of the gold load 60 mm linear stapler was fired at right angles across the lesser curve for about 4 cm. The gastric pouch was further mobilized posteriorly and then the pouch was completed with 4 further firings of the 60 mm blue load linear stapler up through the previously dissected angle of His.  The second 2 firings of the stapler creating the pouch were reinforced with staple line reinforcement.  It was ensured that the pouch was completely mobilized away from the gastric remnant. This created a nice tubular 5 cm gastric pouch. The Roux limb was then brought up in an antecolic fashion with the candycane facing to the patient's left without undue tension. The gastrojejunostomy was created with an initial posterior row of 2-0 Vicryl between the Roux limb and the staple line of the gastric pouch. Enterotomies were then made in the gastric pouch and the Roux limb with the harmonic scalpel and at approximately 2-2-1/2 cm  anastomosis was created with a single firing of the 73m blue load linear stapler. The staple line was inspected and was intact without bleeding. The common enterotomy was then closed with running 2-0 Vicryl begun at either end and tied centrally. The Ewall tube was then easily passed through the anastomosis and an outer anterior layer of running 2-0 Vicryl was placed. The Ewald tube was removed. With the outlet of the gastrojejunostomy clamped and under saline irrigation the assistant performed upper endoscopy and with the gastric pouch tensely distended with air-there was no evidence of leak on this test. The pouch was desufflated. The PTerance Hartdefect was closed with running 2-0 silk. The abdomen was inspected for any evidence of bleeding or bowel injury and everything looked fine. The Nathanson retractor was removed under direct vision after coating the anastomosis with Vistaseall tissue sealant. All CO2 was evacuated and trochars removed. Skin incisions were closed with 4-0 monocryl in a subcuticular fashion followed by  steri-strips and bandages. Sponge needle and instrument counts were correct. The patient was taken to the PACU in good condition.    ELeighton Ruff WRedmond Pulling MD, FACS General, Bariatric, & Minimally Invasive Surgery [facility], [place]

## 2022-06-21 NOTE — Op Note (Signed)
Janet [name] [id number] [birth year] 06/21/2022  Preoperative diagnosis: roux en Y gastric bypass in progress  Postoperative diagnosis: Same   Procedure: Upper endoscopy   Surgeon: Catalina [place]ig[place] B. Hassell Done  M.D., FACS   Anesthesia: Gen.   Indications for procedure: This patient was undergoing a gastric bypass by Dr. Redmond Pulling.    Description of procedure: The endoscopy was placed in the mouth and into the oropharynx and under endoscopic vision it was advanced to the esophagogastric junction.  The pouch was insufflated and was ~ 5 cm.  .   No bleeding or leaks were detected.  The scope was withdrawn without difficulty.     Matt B. Hassell Done, MD, FACS General, Bariatric, & Minimally Invasive Surgery [facility], [place]

## 2022-06-21 NOTE — Anesthesia Procedure Notes (Signed)
Procedure Name: Intubation Date/Time: 06/21/2022 9:43 AM  Performed by: Claudia Desanctis, CRNAPre-anesthesia Checklist: Patient identified, Emergency Drugs available, Suction available and Patient being monitored Patient Re-evaluated:Patient Re-evaluated prior to induction Oxygen Delivery Method: Circle system utilized Preoxygenation: Pre-oxygenation with 100% oxygen Induction Type: IV induction Ventilation: Mask ventilation without difficulty Laryngoscope Size: Miller and 3 Grade View: Grade I Tube type: Oral Tube size: 7.0 mm Number of attempts: 1 Airway Equipment and Method: Stylet Placement Confirmation: ETT inserted through vocal cords under direct vision, positive ETCO2 and breath sounds checked- equal and bilateral Secured at: 21 cm Tube secured with: Tape Dental Injury: Teeth and Oropharynx as per pre-operative assessment

## 2022-06-21 NOTE — H&P (Signed)
CC: here for surgery  Requesting provider: n/a  HPI: Janet [name] is an 36 y.o. female who is here for laparoscopic roux en y gastric bypass possible sliding hiatal hernia repair.. Last seen in clinic on 8/10. Denies any changes since seen in clinic.   Old hpi: Janet [name] is a 36 y.o. female who is seen today for long-term follow-up regarding her severe obesity and related comorbidities which include mild struct of sleep apnea, prediabetes, hypertension, GERD, pseudotumor cerebri, Chiari malformation. She has remote history of Cushing syndrome but no clinical evidence of recurrence. She has a remote history of left adrenalectomy laparoscopically.  Her last visit she has undergone several evaluations which I reviewed and are documented below. She had mild sleep apnea. She denies any trips to the emergency room or hospital. She still has some heartburn. No sensation of anything getting stuck when she swallows liquids or solids. She has a daily headache in the back of her head generally when turning her head. No abdominal pain. No diarrhea or constipation..   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   on MRI   Constipation    Cushing syndrome ([place]) 2011   r/t adrenal tumor; tumor removed, disease resolved   Cushing's syndrome ([place]) 11/27/2013   Gallbladder problem    Gastroesophageal reflux disease    GERD (gastroesophageal reflux disease)    Phreesia 08/12/2020   Headache    Heart murmur    Phreesia 08/12/2020   Hematuria 09/18/2015   Hemorrhoids    History of Chiari malformation    Hypertension    Phreesia 08/12/2020   Internal hemorrhoids with other complication 07/26/5101   JAN 2015 R ANT MAR 2015 R POS AND L LAT BAND    Menstrual  periods irregular 09/18/2015   Migraine without aura, with intractable migraine, so stated, without mention of status migrainosus 11/11/2013   Palpitations    Patient desires pregnancy 11/27/2013   Plantar fasciitis    Pre-diabetes    Prediabetes    Preeclampsia 05/21/2019   2x/wk testing nst alt w/ bpp/dopp      Deliver @ 37wks        IOL scheduled for 8/19 @ 0830   Pregnant 12/16/2015   Sleep apnea    SOBOE (shortness of breath on exertion)    Swelling of both lower extremities    Vitamin D deficiency     Past Surgical History:  Procedure Laterality Date   arthroscopic treatment of osteochondral defect of right ankle  03/2021   Bilateral plantar fascial [place]  03/2021   CHOLECYSTECTOMY  02/2015   COLONOSCOPY N/A 04/19/2013   HEN:IDPOEU mucosa in the terminal ileum/Single erosion in transverse colon/RECTAL BLEEDING DUE TO Moderate sized internal hemorrhoids/ trv colon erosion, bx benign.   COLONOSCOPY WITH PROPOFOL N/A 08/31/2020   Non-bleeding internal hemorrhoids. Colon torturous.    ESOPHAGOGASTRODUODENOSCOPY N/A 04/19/2013   MPN:TIRW Non-erosive gastritis/PERI-UMBILICAL PAIN DUE TO GERD, GASTRITIS, AND CONSTIPATION. Bx with mild chronic inactive gastritis. No H.Pylori   HEMORRHOID BANDING  2015   Dr. Gala Romney- in office banding procedure   LAPAROSCOPIC ADRENALECTOMY  10/28/2010   TONSILLECTOMY     tumor removed left adrenal gland 01/12  Family History  Problem Relation Age of Onset   Hypertension Mother    Hyperlipidemia Mother    Colon polyps Mother    Hypertension Father    Colon polyps Father    Sleep apnea Father    Obesity Father    Deep vein thrombosis Sister    Hypertension Maternal Grandmother    Diabetes Maternal Grandmother    Deep vein thrombosis Maternal Grandmother    Diabetes Paternal Grandmother    COPD Paternal Grandfather    Heart disease Paternal Grandfather    Other Daughter        pulmonary valve stenosis   Stroke Maternal Aunt     Migraines Maternal Aunt    Colon cancer Other        maternal great uncle    Social:  reports that she has never smoked. She has never used smokeless tobacco. She reports that she does not drink alcohol and does not use drugs.  Allergies:  Allergies  Allergen Reactions   Methylprednisolone Shortness Of Breath, Itching and Other (See Comments)    Reaction:  Bruising    Penicillins Anaphylaxis, Rash and Other (See Comments)    Has patient had a PCN reaction causing immediate rash, facial/tongue/throat swelling, SOB or lightheadedness with hypotension: No Has patient had a PCN reaction causing severe rash involving mucus membranes or skin necrosis: No Has patient had a PCN reaction that required hospitalization No Has patient had a PCN reaction occurring within the last 10 years: No If all of the above answers are "NO", then may proceed with Cephalosporin use.   Wound Dressing Adhesive     Burns skin   Latex Rash   Neomycin Rash    Breaks out with neosporin    Medications: I have reviewed the patient's current medications.   ROS - all of the below systems have been reviewed with the patient and positives are indicated with bold text General: chills, fever or night sweats Eyes: blurry vision or double vision ENT: epistaxis or sore throat Allergy/Immunology: itchy/watery eyes or nasal congestion Hematologic/Lymphatic: bleeding problems, blood clots or swollen lymph nodes Endocrine: temperature intolerance or unexpected weight changes Breast: new or changing breast lumps or nipple discharge Resp: cough, shortness of breath, or wheezing CV: chest pain or dyspnea on exertion GI: as per HPI GU: dysuria, trouble voiding, or hematuria MSK: joint pain or joint stiffness Neuro: TIA or stroke symptoms Derm: pruritus and skin lesion changes Psych: anxiety and depression  PE Last menstrual period 05/28/2022. Constitutional: NAD; conversant; no deformities Eyes: Moist conjunctiva; no  lid lag; anicteric; PERRL Neck: Trachea midline; no thyromegaly Lungs: Normal respiratory effort; no tactile fremitus CV: RRR; no palpable thrills; no pitting edema GI: Abd soft, nt, old trocar scars; no palpable hepatosplenomegaly MSK: Normal gait; no clubbing/cyanosis Psychiatric: Appropriate affect; alert and oriented x3 Lymphatic: No palpable cervical or axillary lymphadenopathy Skin:no rash/lesions  No results found for this or any previous visit (from the past 71 hour(s)).  No results found.  Imaging: Reviewed neurologist office note from July 19  MRI 03/12/2022:IMPRESSION: Unremarkable MRI scan of the brain with and without contrast showing type I Arnold-Chiari malformation with low-lying cerebellar tonsils as well as subtle enlargement of the optic nerve sheaths and partially empty sella which are incidental findings. No significant change compared with previous MRI dated 05/01/2021.  CTV 03/12/2022: IMPRESSION: 1. 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 2. No thrombus is present. 3. The superior sagittal sinus,  straight sinus, right transverse sinus and deep cerebral veins are patent.  Upper GI 01/21/22 FINDINGS: Initial KUB demonstrates cholecystectomy clips in the right upper quadrant but appears otherwise normal.  The pharyngeal phase of swallowing appears normal.  Primary peristaltic waves are normal on 4/4 swallows.  There is a very small type 1 hiatal hernia. A small distal esophageal mucosal ring is present on image 34 series 15, but this ring is widely patent at 2.6 cm, and not expected cause symptoms.  Mucosal relief appearance of the stomach is normal. Likewise the duodenum appears unremarkable with normal configuration and no findings of malrotation. Normal proximal jejunal folds.  IMPRESSION: 1. Small type 1 hiatal hernia. 2. Widely patent distal esophageal mucosal ring (Schatzki ring), luminal diameter the ring  is 2.6 cm, rings of this size do not typically cause symptoms. 3. Otherwise normal exam.  CXR 4/23 - nml  Labs June 9 showed a normal folate normal B12  Labs from July 28 involving 24-hour cortisol and free cortisol normal  Home sleep study test April 30th showed mild obstructive sleep apnea with AHI of 6.1 and a O2 sat low of 88%  Reviewed PCP office note from July 12  Reviewed [facility] neurosurgery consult from June 29  Reviewed [facility] endocrinology clearance letter for bariatric surgery. No evidence of Cushing's disease recurrence-dated May 12, 2022  Reviewed psychological assessment February 02, 2022  A/P: Janet [name] is an 36 y.o. female with  Severe obesity (CMS-HCC)  Gastroesophageal reflux disease, unspecified whether esophagitis present  Chiari malformation type I (CMS-HCC)  Essential hypertension  Vitamin D deficiency  Mild obstructive sleep apnea  Chronic nonintractable headache, unspecified headache type  Prediabetes  Cushing syndrome (CMS-HCC)- remission  To OR lap RYGB, upper endoscopy and possible sliding hiatal hernia repair  Enhanced recovery protocol IV antibiotics Preoperative subcutaneous heparin All questions asked and answered    Leighton Ruff. Redmond Pulling, MD, FACS General, Bariatric, & Minimally Invasive Surgery [place]

## 2022-06-21 NOTE — Progress Notes (Signed)
PHARMACY CONSULT FOR:  Risk Assessment for Post-Discharge VTE Following Bariatric Surgery  Post-Discharge VTE Risk Assessment: This patient's probability of 30-day post-discharge VTE is increased due to the factors marked:  Sleeve gastrectomy   Liver disorder (transplant, cirrhosis, or nonalcoholic steatohepatitis)   Hx of VTE   Hemorrhage requiring transfusion   GI perforation, leak, or obstruction   ====================================================    Female    Age >/=60 years    BMI >/=50 kg/m2    CHF    Dyspnea at Rest    Paraplegia  x  Non-gastric-band surgery    Operation Time >/=3 hr    Return to OR     Length of Stay >/= 3 d   Hypercoagulable condition   Significant venous stasis    Predicted probability of 30-day post-discharge VTE: 0.16%  Recommendation for Discharge: No pharmacologic prophylaxis post-discharge  Janet [name] is a 36 y.o. female who underwent  Roux-en-Y on 06/21/22   Case start: 1007 Case end: 1213   Allergies  Allergen Reactions   Methylprednisolone Shortness Of Breath, Itching and Other (See Comments)    Reaction:  Bruising    Penicillins Anaphylaxis, Rash and Other (See Comments)    Has patient had a PCN reaction causing immediate rash, facial/tongue/throat swelling, SOB or lightheadedness with hypotension: No Has patient had a PCN reaction causing severe rash involving mucus membranes or skin necrosis: No Has patient had a PCN reaction that required hospitalization No Has patient had a PCN reaction occurring within the last 10 years: No If all of the above answers are "NO", then may proceed with Cephalosporin use.   Wound Dressing Adhesive     Burns skin   Latex Rash   Neomycin Rash    Breaks out with neosporin    Patient Measurements: Height: '5\' 4"'$  (162.6 cm) Weight: 106.5 kg (234 lb 12.8 oz) IBW/kg (Calculated) : 54.7 Body mass index is 40.3 kg/m.  No results for input(s): "WBC", "HGB", "HCT", "PLT", "APTT", "CREATININE",  "LABCREA", "CREAT24HRUR", "MG", "PHOS", "ALBUMIN", "PROT", "AST", "ALT", "ALKPHOS", "BILITOT", "BILIDIR", "IBILI" in the last 72 hours. Estimated Creatinine Clearance: 115.7 mL/min (by C-G formula based on SCr of 0.79 mg/dL).    Past Medical History:  Diagnosis Date   Adrenal tumor    Anemia    Phreesia 08/12/2020   Arthritis    Back pain    Borderline diabetes 2011   r/t adrenal tumor, now resolved   Carpal tunnel syndrome during pregnancy    Chest pain    Chiari malformation type I ([place]) 2015   on MRI   Constipation    Cushing syndrome ([place]) 2011   r/t adrenal tumor; tumor removed, disease resolved   Cushing's syndrome ([place]) 11/27/2013   Gallbladder problem    Gastroesophageal reflux disease    GERD (gastroesophageal reflux disease)    Phreesia 08/12/2020   Headache    Heart murmur    Phreesia 08/12/2020   Hematuria 09/18/2015   Hemorrhoids    History of Chiari malformation    Hypertension    Phreesia 08/12/2020   Internal hemorrhoids with other complication 31/49/7026   JAN 2015 R ANT MAR 2015 R POS AND L LAT BAND    Menstrual periods irregular 09/18/2015   Migraine without aura, with intractable migraine, so stated, without mention of status migrainosus 11/11/2013   Palpitations    Patient desires pregnancy 11/27/2013   Plantar fasciitis    Pre-diabetes    Prediabetes    Preeclampsia 05/21/2019   2x/wk testing  nst alt w/ bpp/dopp      Deliver @ 37wks        IOL scheduled for 8/19 @ 0830   Pregnant 12/16/2015   Sleep apnea    SOBOE (shortness of breath on exertion)    Swelling of both lower extremities    Vitamin D deficiency      Medications Prior to Admission  Medication Sig Dispense Refill Last Dose   acetaZOLAMIDE (DIAMOX) 250 MG tablet Take 2 tablets (500 mg total) by mouth at bedtime. (Patient taking differently: Take 375 mg by mouth at bedtime.) 180 tablet 3 06/19/2022   ergocalciferol (VITAMIN D2) 1.25 MG (50000 UT) capsule 1 po q wed, and 1 po q sun  8 capsule 6 06/19/2022   pantoprazole (PROTONIX) 40 MG tablet Take one tablet by mouth once daily, as needed, for heartburn 30 tablet 6 06/21/2022 at 0647   metFORMIN (GLUCOPHAGE) 500 MG tablet Take 1 tablet (500 mg total) by mouth daily with breakfast. (Patient not taking: Reported on 06/14/2022) 90 tablet 1 Not Taking       Lenis Noon, PharmD 06/21/2022,3:40 PM

## 2022-06-21 NOTE — Progress Notes (Signed)
Patient stated she does not wear CPAP at home. Patient does not want to wear tonight.

## 2022-06-22 ENCOUNTER — Encounter (HOSPITAL_COMMUNITY): Payer: Self-pay | Admitting: General Surgery

## 2022-06-22 LAB — CBC WITH DIFFERENTIAL/PLATELET
Abs Immature Granulocytes: 0.02 10*3/uL (ref 0.00–0.07)
Basophils Absolute: 0 10*3/uL (ref 0.0–0.1)
Basophils Relative: 0 %
Eosinophils Absolute: 0 10*3/uL (ref 0.0–0.5)
Eosinophils Relative: 0 %
HCT: 35.7 % — ABNORMAL LOW (ref 36.0–46.0)
Hemoglobin: 11.3 g/dL — ABNORMAL LOW (ref 12.0–15.0)
Immature Granulocytes: 0 %
Lymphocytes Relative: 9 %
Lymphs Abs: 0.9 10*3/uL (ref 0.7–4.0)
MCH: 26.3 pg (ref 26.0–34.0)
MCHC: 31.7 g/dL (ref 30.0–36.0)
MCV: 83 fL (ref 80.0–100.0)
Monocytes Absolute: 0.8 10*3/uL (ref 0.1–1.0)
Monocytes Relative: 8 %
Neutro Abs: 7.4 10*3/uL (ref 1.7–7.7)
Neutrophils Relative %: 83 %
Platelets: 322 10*3/uL (ref 150–400)
RBC: 4.3 MIL/uL (ref 3.87–5.11)
RDW: 14.4 % (ref 11.5–15.5)
WBC: 9 10*3/uL (ref 4.0–10.5)
nRBC: 0 % (ref 0.0–0.2)

## 2022-06-22 LAB — COMPREHENSIVE METABOLIC PANEL
ALT: 12 U/L (ref 0–44)
AST: 19 U/L (ref 15–41)
Albumin: 3.1 g/dL — ABNORMAL LOW (ref 3.5–5.0)
Alkaline Phosphatase: 45 U/L (ref 38–126)
Anion gap: 4 — ABNORMAL LOW (ref 5–15)
BUN: 6 mg/dL (ref 6–20)
CO2: 22 mmol/L (ref 22–32)
Calcium: 8.2 mg/dL — ABNORMAL LOW (ref 8.9–10.3)
Chloride: 110 mmol/L (ref 98–111)
Creatinine, Ser: 0.66 mg/dL (ref 0.44–1.00)
GFR, Estimated: >60 mL/min (ref >60)
Glucose, Bld: 143 mg/dL — ABNORMAL HIGH (ref 70–99)
Potassium: 4.3 mmol/L (ref 3.5–5.1)
Sodium: 136 mmol/L (ref 135–145)
Total Bilirubin: 0.4 mg/dL (ref 0.3–1.2)
Total Protein: 6.5 g/dL (ref 6.5–8.1)

## 2022-06-22 MED ORDER — PHENOL 1.4 % MT LIQD: 1.0000 | OROMUCOSAL | Status: DC | PRN

## 2022-06-22 NOTE — Progress Notes (Signed)
1 Day Post-Op   Subjective/Chief Complaint: Having some nausea with protein this am Ambulated Some gas pain  Objective: Vital signs in last 24 hours: Temp:  [97.3 F (36.3 C)-98.9 F (37.2 C)] 98.6 F (37 C) (09/13 0922) Pulse Rate:  [45-82] 45 (09/13 0922) Resp:  [15-18] 18 (09/13 0922) BP: (112-143)/(68-88) 143/79 (09/13 0922) SpO2:  [94 %-100 %] 98 % (09/13 0922) Last BM Date : 06/20/22  Intake/Output from previous day: 09/12 0701 - 09/13 0700 In: 3799.4 [P.O.:240; I.V.:3399.9; IV Piggyback:159.5] Out: 2725 [Urine:2700; Blood:25] Intake/Output this shift: No intake/output data recorded.  Alert, sitting in bs chair Nontoxic Nonlabored Soft, mild approp TTP, incisions ok +SCDs  Lab Results:  Recent Labs    06/21/22 1542 06/22/22 0437  WBC 12.0* 9.0  HGB 11.9* 11.3*  HCT 36.9 35.7*  PLT 313 322   BMET Recent Labs    06/21/22 1542 06/22/22 0437  NA  --  136  K  --  4.3  CL  --  110  CO2  --  22  GLUCOSE  --  143*  BUN  --  6  CREATININE 0.73 0.66  CALCIUM  --  8.2*   PT/INR No results for input(s): "LABPROT", "INR" in the last 72 hours. ABG No results for input(s): "PHART", "HCO3" in the last 72 hours.  Invalid input(s): "PCO2", "PO2"  Studies/Results: No results found.  Anti-infectives: Anti-infectives (From admission, onward)    Start     Dose/Rate Route Frequency Ordered Stop   06/21/22 0830  gentamicin (GARAMYCIN) 1.5 mg/kg, clindamycin (CLEOCIN) 900 mg in dextrose 5 % 100 mL IVPB  Status:  Discontinued        1.5 mg/kg 212 mL/hr over 30 Minutes Intravenous On call to O.R. 06/21/22 1610 06/21/22 0826   06/21/22 0830  clindamycin (CLEOCIN) IVPB 900 mg       See Hyperspace for full Linked Orders Report.   900 mg 100 mL/hr over 30 Minutes Intravenous On call to O.R. 06/21/22 9604 06/21/22 0959   06/21/22 0830  gentamicin (GARAMYCIN) 380 mg in dextrose 5 % 100 mL IVPB       See Hyperspace for full Linked Orders Report.   5 mg/kg  75.4 kg  (Adjusted) 219 mL/hr over 30 Minutes Intravenous On call to O.R. 06/21/22 0826 06/21/22 1011       Assessment/Plan: s/p Procedure(s): LAPAROSCOPIC ROUX-EN-Y GASTRIC BYPASS WITH UPPER ENDOSCOPY WITH HIATAL HERNIA REPAIR (N/A)  Left liver lesion - outpt imaging  Cont chemical vte prophylaxis Ambulate Pulm toilet Cont bari full liquids   I called and Left VM on pt's cell answering questions that were relayed to me via the bari nurse. Pt has only tolerated once 2oz cup of protein so far today - not safe for dc today given low protein intake. Will keep tonight   LOS: 1 day    Janet [name] 06/22/2022

## 2022-06-22 NOTE — Progress Notes (Signed)
Patient alert and oriented, pain is controlled. Patient is making effort to advance to protein shake today. Pt reported nausea upon starting the protein drink, and feeling slight discomfort in the throat. Pt is now on the first cup of protein. Pt requested an additional follow-up from the surgeon about report findings in the chart. Communicated pt request to surgeon. Reviewed Gastric sleeve discharge instructions with patient and patient is able to articulate understanding. Provided information on BELT program, Support Group and [facility] outpatient pharmacy. Communicated general update of patient status to surgeon. All questions answered. 24hr fluid recall is 272m per hydration protocol, bariatric nurse coordinator to make follow-up phone call within one week.

## 2022-06-22 NOTE — Progress Notes (Signed)
Pt has only consumed her second cup of protein for POD #1 in this 12 hr period. Noted that pt has been sipping on ionized water that her family has brought in. Instructed pt that unless she is nauseated, she should stop sipping the water and focus on getting her protein shake in. Pt reports that her doctor called her on the phone and said that it was ok if she just focused on the water and consumed only 3 cups of water and/or protein for today. Pt cont w IVFs at 125/hr at this time, reminded pt that she will get her hydration from her IVFs and we need her to focus on sipping her protein shake. Will cont to monitor

## 2022-06-22 NOTE — Progress Notes (Signed)
Followed up on pt, pt reported relief from nausea with Zofran medication. Reminded pt to pace out the protein intake. received contact from the surgeon with order updates and note to keep pt an additional night to ensure a smooth transition to home and toleration of fluids to meet the discharge goal. Nursing staff were updated. Pt was given unjury powder broth and stated it was easier to consume. Continue to monitor.

## 2022-06-22 NOTE — Discharge Instructions (Signed)

## 2022-06-22 NOTE — Plan of Care (Signed)
  Problem: Activity: Goal: Ability to tolerate increased activity will improve Outcome: Progressing   

## 2022-06-22 NOTE — Inpatient Diabetes Management (Signed)
Inpatient Diabetes Program Recommendations  AACE/ADA: New Consensus Statement on Inpatient Glycemic Control (2015)  Target Ranges:  Prepandial:   less than 140 mg/dL      Peak postprandial:   less than 180 mg/dL (1-2 hours)      Critically ill patients:  140 - 180 mg/dL   Lab Results  Component Value Date   GLUCAP 163 (H) 06/21/2022   HGBA1C 6.2 (H) 12/08/2020    Review of Glycemic Control  Latest Reference Range & Units 06/21/22 08:33 06/21/22 12:26  Glucose-Capillary 70 - 99 mg/dL 82 163 (H)  (H): Data is abnormally high  Diabetes history:  DM2  Outpatient Diabetes medications:  Metformin 500 mg QD  Current orders for Inpatient glycemic control:  none  Inpatient Diabetes Program Recommendations:    CBGs ac/hs.  Received referral for bariatric patient on Metformin.  Metformin can be reintroduced on POD1 after bariatric surgery.  Patient may continue  Metformin as tolerated.  Metformin does not cause hypoglycemia.     Will continue to follow while inpatient.  Thank you, Reche Dixon, MSN, [place] Diabetes Coordinator Inpatient Diabetes Program [phone] (team pager from 8a-5p)

## 2022-06-23 ENCOUNTER — Other Ambulatory Visit (HOSPITAL_COMMUNITY): Payer: Self-pay

## 2022-06-23 LAB — CBC WITH DIFFERENTIAL/PLATELET
Abs Immature Granulocytes: 0.02 10*3/uL (ref 0.00–0.07)
Basophils Absolute: 0 10*3/uL (ref 0.0–0.1)
Basophils Relative: 1 %
Eosinophils Absolute: 0 10*3/uL (ref 0.0–0.5)
Eosinophils Relative: 1 %
HCT: 32.2 % — ABNORMAL LOW (ref 36.0–46.0)
Hemoglobin: 10.2 g/dL — ABNORMAL LOW (ref 12.0–15.0)
Immature Granulocytes: 0 %
Lymphocytes Relative: 41 %
Lymphs Abs: 3.3 10*3/uL (ref 0.7–4.0)
MCH: 26.8 pg (ref 26.0–34.0)
MCHC: 31.7 g/dL (ref 30.0–36.0)
MCV: 84.7 fL (ref 80.0–100.0)
Monocytes Absolute: 0.6 10*3/uL (ref 0.1–1.0)
Monocytes Relative: 8 %
Neutro Abs: 4.1 10*3/uL (ref 1.7–7.7)
Neutrophils Relative %: 49 %
Platelets: 269 10*3/uL (ref 150–400)
RBC: 3.8 MIL/uL — ABNORMAL LOW (ref 3.87–5.11)
RDW: 15 % (ref 11.5–15.5)
WBC: 8.1 10*3/uL (ref 4.0–10.5)
nRBC: 0 % (ref 0.0–0.2)

## 2022-06-23 MED ORDER — ACETAMINOPHEN 500 MG PO TABS: 1000.0000 mg | ORAL_TABLET | Freq: Three times a day (TID) | ORAL | 0 refills | Status: AC

## 2022-06-23 MED ORDER — ONDANSETRON 4 MG PO TBDP
4.0000 mg | ORAL_TABLET | Freq: Four times a day (QID) | ORAL | 0 refills | Status: DC | PRN
  Filled 2022-06-23: qty 20, 5d supply, fill #0

## 2022-06-23 MED ORDER — OXYCODONE HCL 5 MG PO TABS
5.0000 mg | ORAL_TABLET | Freq: Four times a day (QID) | ORAL | 0 refills | Status: DC | PRN
  Filled 2022-06-23: qty 10, 3d supply, fill #0

## 2022-06-23 NOTE — Discharge Summary (Signed)
Physician Discharge Summary  Janet [name] NWG:[id number] DOB: 1986/09/25 DOA: 06/21/2022  PCP: Janet Helper, MD  Admit date: 06/21/2022 Discharge date: 06/23/2022  Recommendations for Outpatient Follow-up:     Follow-up Information     [name], MD. Go on 07/14/2022.   Specialty: General Surgery Why: Please arrive 15 minutes prior to your appointment at 8:30am Contact information: [address] [place] [address] [phone]         Maczis, Carlena Hurl, [place]. Go on 08/09/2022.   Specialty: General Surgery Why: Please arrive 15 minutes prior to your appointment at 4:15pm with Janet [name], on behalf of Dr. Cyndi Bender information: [address] [place] [address] [phone]                Discharge Diagnoses:  Principal Problem:   S/P gastric bypass Severe obesity (BMI 41)  Gastroesophageal reflux disease, unspecified whether esophagitis present  Chiari malformation type I (CMS-HCC)  Essential hypertension  Vitamin D deficiency  Mild obstructive sleep apnea  Chronic nonintractable headache, unspecified headache type  Prediabetes  Left liver lobe mass - plan outpatient imaging      Postoperative  diagnosis:  1. Same + small sliding hiatal hernia + posterior left hepatic mass   Surgical procedure: Laparoscopic Roux-en-Y gastric bypass (ante-colic, ante-gastric) with sliding hiatal hernia repair; upper endoscopy   Discharge Condition: Good Disposition: Home  Diet recommendation: Postoperative gastric bypass diet  Filed Weights   06/21/22 0819  Weight: 106.5 kg     Hospital Course:  The patient was admitted for a planned laparoscopic Roux-en-Y gastric bypass. Please see operative note.  During surgery as we retracted and lifted up the left hepatic lobe to visualize the hiatus.  There is a well-circumscribed mass seen within the posterior left hepatic lobe.  It was smooth and symmetrical.  There are no  other abnormalities.  I felt it was safe to proceed.  We will get outpatient imaging to further characterize the liver mass.  Preoperatively the patient was given 5000 units of subcutaneous heparin for DVT prophylaxis. ERAS protocol was used. Postoperative prophylactic Lovenox dosing was started on the evening of postoperative day 0.  The patient was started on ice chips and water on the evening of POD 0 which they tolerated. On postoperative day 1 The patient's diet was advanced to protein shakes which they also tolerated however the patient was having nausea and did not meet criteria for discharge based on poor oral intake.. On POD 2, The patient was ambulating without difficulty. Their vital signs are stable without fever or tachycardia. Their hemoglobin had remained stable.  The patient's oral intake dramatically improved and her nausea resolved.  The patient had received discharge instructions and counseling. They were deemed stable for discharge.  BP 101/82 (BP Location: Right Arm)   Pulse 60   Temp 98.6 F (37 C) (Oral)   Resp 18   Ht '5\' 4"'$  (1.626 m)   Wt 106.5 kg   LMP 05/28/2022 Comment: negative urine preg.06/21/22  SpO2 98%   BMI 40.30 kg/m   Gen: alert, NAD, non-toxic appearing Pupils: equal, no scleral icterus Pulm: Lungs clear to auscultation, symmetric chest rise CV: regular rate and rhythm Abd: soft, min tender, nondistended. No cellulitis. No incisional hernia Ext: no edema, no calf tenderness Skin: no rash, no jaundice  Discharge Instructions  Discharge Instructions     Ambulate hourly while awake   Complete by: As directed    Call  MD for:  difficulty breathing, headache or visual disturbances   Complete by: As directed    Call MD for:  persistant dizziness or light-headedness   Complete by: As directed    Call MD for:  persistant nausea and vomiting   Complete by: As directed    Call MD for:  redness, tenderness, or signs of infection (pain, swelling, redness, odor  or green/yellow discharge around incision site)   Complete by: As directed    Call MD for:  severe uncontrolled pain   Complete by: As directed    Call MD for:  temperature >101 F   Complete by: As directed    Diet bariatric full liquid   Complete by: As directed    Discharge instructions   Complete by: As directed    See bariatric discharge instructions   Incentive spirometry   Complete by: As directed    Perform hourly while awake      Allergies as of 06/23/2022       Reactions   Methylprednisolone Shortness Of Breath, Itching, Other (See Comments)   Reaction:  Bruising    Penicillins Anaphylaxis, Rash, Other (See Comments)   Has patient had a PCN reaction causing immediate rash, facial/tongue/throat swelling, SOB or lightheadedness with hypotension: No Has patient had a PCN reaction causing severe rash involving mucus membranes or skin necrosis: No Has patient had a PCN reaction that required hospitalization No Has patient had a PCN reaction occurring within the last 10 years: No If all of the above answers are "NO", then may proceed with Cephalosporin use.   Wound Dressing Adhesive    Burns skin   Latex Rash   Neomycin Rash   Breaks out with neosporin        Medication List     STOP taking these medications    metFORMIN 500 MG tablet Commonly known as: GLUCOPHAGE       TAKE these medications    acetaminophen 500 MG tablet Commonly known as: TYLENOL Take 2 tablets (1,000 mg total) by mouth every 8 (eight) hours for 5 days.   acetaZOLAMIDE 250 MG tablet Commonly known as: DIAMOX Take 2 tablets (500 mg total) by mouth at bedtime. What changed: how much to take   ergocalciferol 1.25 MG (50000 UT) capsule Commonly known as: VITAMIN D2 1 po q wed, and 1 po q sun   ondansetron 4 MG disintegrating tablet Commonly known as: ZOFRAN-ODT Take 1 tablet (4 mg total) by mouth every 6 (six) hours as needed for nausea or vomiting.   oxyCODONE 5 MG immediate  release tablet Commonly known as: Oxy IR/ROXICODONE Take 1 tablet (5 mg total) by mouth every 6 (six) hours as needed for severe pain.   pantoprazole 40 MG tablet Commonly known as: PROTONIX Take one tablet by mouth once daily, as needed, for heartburn        Follow-up Information     [name], MD. Go on 07/14/2022.   Specialty: General Surgery Why: Please arrive 15 minutes prior to your appointment at 8:30am Contact information: [address] [place] [address] [phone]         Maczis, Carlena Hurl, [place]. Go on 08/09/2022.   Specialty: General Surgery Why: Please arrive 15 minutes prior to your appointment at 4:15pm with Janet [name], on behalf of Dr. Cyndi Bender information: [place] [address] [phone]  The results of significant diagnostics from this hospitalization (including imaging, microbiology, ancillary and laboratory) are listed below for reference.    Significant Diagnostic Studies: No results found.  Labs: Basic Metabolic Panel: Recent Labs  Lab 06/17/22 1450 06/21/22 1542 06/22/22 0437  NA 137  --  136  K 3.7  --  4.3  CL 108  --  110  CO2 26  --  22  GLUCOSE 108*  --  143*  BUN 9  --  6  CREATININE 0.79 0.73 0.66  CALCIUM 8.7*  --  8.2*   Liver Function Tests: Recent Labs  Lab 06/17/22 1450 06/22/22 0437  AST 15 19  ALT 11 12  ALKPHOS 60 45  BILITOT 0.4 0.4  PROT 6.9 6.5  ALBUMIN 3.5 3.1*    CBC: Recent Labs  Lab 06/17/22 1450 06/21/22 1542 06/22/22 0437 06/23/22 0502  WBC 6.7 12.0* 9.0 8.1  NEUTROABS 4.0  --  7.4 4.1  HGB 11.7* 11.9* 11.3* 10.2*  HCT 37.0 36.9 35.7* 32.2*  MCV 82.8 82.9 83.0 84.7  PLT 363 313 322 269    CBG: Recent Labs  Lab 06/17/22 1426 06/21/22 0833 06/21/22 1226  GLUCAP 99 82 163*    Principal Problem:   S/P gastric bypass   Time coordinating discharge: 20 min  Signed:  Gayland Curry, MD [facility]  Surgery, [place] [phone] 06/23/2022, 9:26 AM

## 2022-06-23 NOTE — Progress Notes (Signed)
Patient alert and oriented, Post op day 2.  Provided support and encouragement.  Encouraged pulmonary toilet, ambulation and small sips of liquids.  Reinforced education about daily protein and fluid goals.  All questions answered.  Will continue to monitor. [facility] outpt pharmacy to deliver medications and bariatric supplements to bedside.

## 2022-06-23 NOTE — TOC Progression Note (Signed)
Transition of Care [facility]) - Progression Note    Patient Details  Name: Janet [name] MRN: [id number] Date of Birth: 31-Dec-1985  Transition of Care [facility]) CM/SW Contact  Servando Snare, [place] Phone Number: 06/23/2022, 9:24 AM  Clinical Narrative:      Transition of Care (TOC) Screening Note   Patient Details  Name: Janet [name] Date of Birth: 12-03-1985   Transition of Care [facility]) CM/SW Contact:    Servando Snare, LCSW Phone Number: 06/23/2022, 9:24 AM    Transition of Care Department [facility]) has reviewed patient and no TOC needs have been identified at this time. We will continue to monitor patient advancement through interdisciplinary progression rounds. If new patient transition needs arise, please place a TOC consult.         Expected Discharge Plan and Services                                                 Social Determinants of Health (SDOH) Interventions    Readmission Risk Interventions     No data to display

## 2022-06-23 NOTE — Plan of Care (Signed)
  Problem: Education: Goal: Ability to state signs and symptoms to report to health care provider will improve Outcome: Completed/Met Goal: Knowledge of the prescribed self-care regimen will improve Outcome: Completed/Met Goal: Knowledge of discharge needs will improve Outcome: Completed/Met   Problem: Activity: Goal: Ability to tolerate increased activity will improve Outcome: Completed/Met   Problem: Bowel/Gastric: Goal: Gastrointestinal status for postoperative course will improve Outcome: Completed/Met Goal: Occurrences of nausea will decrease Outcome: Completed/Met   Problem: Coping: Goal: Development of coping mechanisms to deal with changes in body function or appearance will improve Outcome: Completed/Met   Problem: Fluid Volume: Goal: Maintenance of adequate hydration will improve Outcome: Completed/Met   Problem: Nutritional: Goal: Nutritional status will improve Outcome: Completed/Met   Problem: Clinical Measurements: Goal: Will show no signs or symptoms of venous thromboembolism Outcome: Completed/Met Goal: Will remain free from infection Outcome: Completed/Met Goal: Will show no signs of GI Leak Outcome: Completed/Met   Problem: Respiratory: Goal: Will regain and/or maintain adequate ventilation Outcome: Completed/Met   Problem: Pain Management: Goal: Pain level will decrease Outcome: Completed/Met   Problem: Skin Integrity: Goal: Demonstration of wound healing without infection will improve Outcome: Completed/Met   Problem: Education: Goal: Knowledge of General Education information will improve Description: Including pain rating scale, medication(s)/side effects and non-pharmacologic comfort measures Outcome: Completed/Met   Problem: Health Behavior/Discharge Planning: Goal: Ability to manage health-related needs will improve Outcome: Completed/Met   Problem: Clinical Measurements: Goal: Ability to maintain clinical measurements within normal  limits will improve Outcome: Completed/Met Goal: Will remain free from infection Outcome: Completed/Met Goal: Diagnostic test results will improve Outcome: Completed/Met Goal: Respiratory complications will improve Outcome: Completed/Met Goal: Cardiovascular complication will be avoided Outcome: Completed/Met   Problem: Activity: Goal: Risk for activity intolerance will decrease Outcome: Completed/Met   Problem: Nutrition: Goal: Adequate nutrition will be maintained Outcome: Completed/Met   Problem: Coping: Goal: Level of anxiety will decrease Outcome: Completed/Met   Problem: Elimination: Goal: Will not experience complications related to bowel motility Outcome: Completed/Met Goal: Will not experience complications related to urinary retention Outcome: Completed/Met   Problem: Pain Managment: Goal: General experience of comfort will improve Outcome: Completed/Met   Problem: Safety: Goal: Ability to remain free from injury will improve Outcome: Completed/Met   Problem: Skin Integrity: Goal: Risk for impaired skin integrity will decrease Outcome: Completed/Met   

## 2022-06-23 NOTE — Progress Notes (Signed)
PT refused CPAP.  

## 2022-06-27 ENCOUNTER — Other Ambulatory Visit (HOSPITAL_COMMUNITY): Payer: Self-pay | Admitting: General Surgery

## 2022-06-27 ENCOUNTER — Telehealth (HOSPITAL_COMMUNITY): Payer: Self-pay | Admitting: *Deleted

## 2022-06-27 ENCOUNTER — Ambulatory Visit (HOSPITAL_COMMUNITY)
Admission: RE | Admit: 2022-06-27 | Discharge: 2022-06-27 | Disposition: A | Discharge status: Home / Self Care | Payer: 59 | Source: Ambulatory Visit | Attending: General Surgery | Admitting: General Surgery

## 2022-06-27 DIAGNOSIS — M79632 Pain in left forearm: Secondary | ICD-10-CM | POA: Insufficient documentation

## 2022-06-27 DIAGNOSIS — R609 Edema, unspecified: Secondary | ICD-10-CM

## 2022-06-27 DIAGNOSIS — M7989 Other specified soft tissue disorders: Secondary | ICD-10-CM | POA: Diagnosis not present

## 2022-06-27 NOTE — Telephone Encounter (Signed)
1.  Tell me about your pain and pain management? Pt states that she is having pain on her left side and back.  States that she is taking Tylenol. Pt states that she has taken the pain pill at night to get rest.  2.  Let's talk about fluid intake.  How much total fluid are you taking in? Pt states that she is working to meet goal of 64 oz of fluid todayPt states that she is getting in at least 45oz of fluid including protein shakes, bottled water, broth, yogurt and Gatorade Zero. Pt instructed to assess status and suggestions daily utilizing Hydration Action Plan on discharge folder and to call [facility] if in the "red zone".   3.  How much protein have you taken in the last 2 days? Pt states she is meeting her goal of 60g of protein each day with the protein shakes and yogurt.  4.  Have you had nausea?  Tell me about when have experienced nausea and what you did to help? Pt states that she experiences nausea with the sugar free drinks.  Denies vomiting.  States that the Zofran alleviates her symptoms.   5.  Has the frequency or color changed with your urine? Pt states that she is urinating "fine" with no changes in frequency or urgency.     6.  Tell me what your incisions look like? "Incisions look fine" with the exception of 1 on "my left side".  Pt states it "was bleeding some when I took the outer bandage off".  Pt denies a fever, chills.  Pt states that the other incisions are "fine" and are not swollen, open, or draining.  Pt has reached out to [facility].  No f/u needed, incision is not actively bleeding. Pt encouraged to call [facility] for any further concern about incision.   7.  Have you been passing gas? BM? Pt states that she has not had a BM.  Pt instructed to take either Miralax or MoM as instructed per "Gastric Bypass/Sleeve Discharge Home Care Instructions".  Pt to call surgeon's office if not able to have BM with medication.   8.  If a problem or question were to arise who would you call?  Do you  know contact numbers for [facility], [facility], and [facility]? Pt denies dehydration symptoms.  Pt can describe s/sx of dehydration.  Pt knows to call [facility] for surgical, [facility] for nutrition, and [facility] for non-urgent questions or concerns.   9.  How has the walking going? Pt states she is walking around and able to be active without difficulty.   10. Are you still using your incentive spirometer?  If so, how often? Pt states that she is doing the I.S. and achieving 1250.  Pt encouraged to use incentive spirometer, at least 10x every hour while awake until she sees the surgeon.  11.  How are your vitamins and calcium going?  How are you taking them? Pt states that she is taking her supplements and vitamins without difficulty.  Pt has reached out to [facility] in regard to left hand/arm swelling where the IV site was while inpatient.  Dr. Redmond Pulling has ordered an ultrasound for follow up this afternoon.  Reinforced elevation and warm compress to site.  Reminded patient that the first 30 days post-operatively are important for successful recovery.  Practice good hand hygiene, wearing a mask when appropriate (since optional in most places), and minimizing exposure to people who live outside of the home, especially if they are  exhibiting any respiratory, GI, or illness-like symptoms.

## 2022-06-27 NOTE — Progress Notes (Signed)
Upper extremity venous duplex has been completed.   Preliminary results in CV Proc.   Myrle Dues Kailyn Dubie 06/27/2022 3:20 PM

## 2022-07-05 ENCOUNTER — Encounter: Payer: Self-pay | Admitting: Dietician

## 2022-07-05 ENCOUNTER — Encounter: Payer: 59 | Attending: Family Medicine | Admitting: Dietician

## 2022-07-05 DIAGNOSIS — E669 Obesity, unspecified: Secondary | ICD-10-CM | POA: Insufficient documentation

## 2022-07-05 NOTE — Progress Notes (Signed)
2 Week Post-Operative Nutrition Class   Patient was seen on 07/05/2022 for Post-Operative Nutrition education at [facility].    Surgery date: 06/21/2022 Surgery type: RYGB  Anthropometrics  Start weight at [facility]: 248.4 lbs (date: 04/08/2022)  Height: 64 in Weight today: 219.1 lbs. BMI: 42.64 kg/m2     Clinical  Medical hx: Sleep apnea, obesity, GERD, Nerve/muscle disease, anemia, heart murmur, Cushing's Syndrome Medications: Vit D  Labs: per patient 09 Mar 2022: Vit D 15.6; A1C 6.1; Chol 179; LDL 106; HDL 48 Notable signs/symptoms: none noted Any previous deficiencies? No   Body Composition Scale 07/05/2022  Current Body Weight 219.1  Total Body Fat % 41.4  Visceral Fat 11  Fat-Free Mass % 58.5   Total Body Water % 43.7  Muscle-Mass lbs 32.1  BMI 37.5  Body Fat Displacement          Torso  lbs 56.2         Left Leg  lbs 11.2         Right Leg  lbs 11.2         Left Arm  lbs 5.6         Right Arm   lbs 5.6      The following the learning objectives were met by the patient during this course: Identifies Phase 3 (Soft, High Proteins) Dietary Goals and will begin from 2 weeks post-operatively to 2 months post-operatively Identifies appropriate sources of fluids and proteins  Identifies appropriate fat sources and healthy verses unhealthy fat types   States protein recommendations and appropriate sources post-operatively Identifies the need for appropriate texture modifications, mastication, and bite sizes when consuming solids Identifies appropriate fat consumption and sources Identifies appropriate multivitamin and calcium sources post-operatively Describes the need for physical activity post-operatively and will follow MD recommendations States when to call healthcare provider regarding medication questions or post-operative complications   Handouts given during class include: Phase 3A: Soft, High Protein Diet Handout Phase 3 High  Protein Meals Healthy Fats   Follow-Up Plan: Patient will follow-up at [facility] in 6 weeks for 2 month post-op nutrition visit for diet advancement per MD.

## 2022-07-12 ENCOUNTER — Telehealth: Payer: Self-pay | Admitting: Dietician

## 2022-07-12 NOTE — Telephone Encounter (Signed)
RD called pt to verify fluid intake once starting soft, solid proteins 2 week post-bariatric surgery.   Daily Fluid intake: 64+ oz. Daily Protein intake: 60 grams Bowel Habits: every other day  Concerns/issues: salmon does not work, but everything else is okay, stating she can only get a few bites in, and then will eat again in 2-3 hours.  Pt states she is getting her 60 grams of protein a day.

## 2022-07-19 ENCOUNTER — Other Ambulatory Visit: Payer: Self-pay | Admitting: Student

## 2022-07-19 ENCOUNTER — Ambulatory Visit
Admission: RE | Admit: 2022-07-19 | Discharge: 2022-07-19 | Disposition: A | Discharge status: Home / Self Care | Payer: 59 | Source: Ambulatory Visit | Attending: Student | Admitting: Student

## 2022-07-19 DIAGNOSIS — Z01818 Encounter for other preprocedural examination: Secondary | ICD-10-CM | POA: Insufficient documentation

## 2022-07-19 MED ORDER — ONDANSETRON HCL 4 MG/2ML IJ SOLN: 4.0000 mg | INTRAMUSCULAR | Status: DC | PRN

## 2022-07-19 MED ORDER — ONDANSETRON 4 MG PO TBDP: 4.0000 mg | ORAL_TABLET | ORAL | Status: DC | PRN

## 2022-07-19 MED ORDER — SODIUM CHLORIDE 0.9 % IV BOLUS
1000.0000 mL | Freq: Once | INTRAVENOUS | Status: AC
  Administered 2022-07-19: 1000 mL via INTRAVENOUS

## 2022-07-19 MED ORDER — THIAMINE HCL 100 MG/ML IJ SOLN
Freq: Once | INTRAVENOUS | Status: AC
  Filled 2022-07-19: qty 1000

## 2022-07-19 NOTE — Progress Notes (Signed)
Orders placed for IV fluids.

## 2022-07-25 ENCOUNTER — Telehealth: Payer: Self-pay | Admitting: Neurology

## 2022-07-25 ENCOUNTER — Telehealth (INDEPENDENT_AMBULATORY_CARE_PROVIDER_SITE_OTHER): Payer: 59 | Admitting: Neurology

## 2022-07-25 ENCOUNTER — Encounter: Payer: Self-pay | Admitting: Neurology

## 2022-07-25 DIAGNOSIS — G935 Compression of brain: Secondary | ICD-10-CM

## 2022-07-25 DIAGNOSIS — R519 Headache, unspecified: Secondary | ICD-10-CM

## 2022-07-25 DIAGNOSIS — G932 Benign intracranial hypertension: Secondary | ICD-10-CM | POA: Diagnosis not present

## 2022-07-25 DIAGNOSIS — G8929 Other chronic pain: Secondary | ICD-10-CM

## 2022-07-25 DIAGNOSIS — H4710 Unspecified papilledema: Secondary | ICD-10-CM

## 2022-07-25 MED ORDER — ACETAZOLAMIDE 250 MG PO TABS: ORAL_TABLET | ORAL | 4 refills | Status: DC

## 2022-07-25 NOTE — Patient Instructions (Signed)
Continue diamox See dr Katy Fitch in January Call or go to ED for worsening symptoms especially vision changes  Idiopathic Intracranial Hypertension  Idiopathic intracranial hypertension (IIH) is a condition that increases pressure around the brain. The fluid that surrounds the brain and spinal cord (cerebrospinal fluid, or CSF) increases and causes the pressure. Idiopathic means that the cause of this condition is not known. IIH affects the brain and spinal cord (neurological disorder). If this condition is not treated, it can cause vision loss or blindness. What are the causes? The cause of this condition is not known. What increases the risk? The following factors may make you more likely to develop this condition: Being very overweight (obese). Being a female between the ages of 5 and 73 years old, who has not gone through menopause. Taking certain medicines, such as birth control or steroids. What are the signs or symptoms? Symptoms of this condition include: Headaches. This is the most common symptom. Brief episodes of total blindness. Double vision, blurred vision, or poor side (peripheral) vision. Pain in the shoulders or neck. Nausea and vomiting. A sound like rushing water or a pulsing sound within the ears (pulsatile tinnitus), or ringing in the ears. How is this diagnosed? This condition may be diagnosed based on: Your symptoms and medical history. Imaging tests of the brain, such as: CT scan. MRI. Magnetic resonance venogram (MRV) to check the veins. Diagnostic lumbar puncture. This is a procedure to remove and examine a sample of cerebrospinal fluid. This procedure can determine whether too much fluid may be causing IIH. A thorough eye exam to check for swelling or nerve damage in the eyes. How is this treated? Treatment for this condition depends on the symptoms. The goal of treatment is to decrease the pressure around your brain. Common treatments include: Weight loss  through healthy eating, salt restriction, and exercise, if you are overweight. Medicines to decrease the production of spinal fluid and lower the pressure within your skull. Medicines to prevent or treat headaches. Other treatments may include: Surgery to place drains (shunts) in your brain for removing excess fluid. Lumbar puncture to remove excess cerebrospinal fluid. Follow these instructions at home: If you are overweight or obese, work with your health care provider to lose weight. Take over-the-counter and prescription medicines only as told by your health care provider. Ask your health care provider if the medicine prescribed to you requires you to avoid driving or using machinery. Do not use any products that contain nicotine or tobacco, such as cigarettes, e-cigarettes, and chewing tobacco. If you need help quitting, ask your health care provider. Keep all follow-up visits as told by your health care provider. This is important. Contact a health care provider if: You have changes in your vision, such as: Double vision. Blurred vision. Poor peripheral vision. Get help right away if: You have any of the following symptoms and they get worse or do not get better: Headaches. Nausea. Vomiting. Sudden trouble seeing. Summary Idiopathic intracranial hypertension (IIH) is a condition that increases pressure around the brain. The cause is not known (is idiopathic). The most common symptom of IIH is headaches. Vision changes, pain in the shoulders or neck, nausea, and vomiting may also occur. Treatment for this condition depends on your symptoms. The goal of treatment is to decrease the pressure around your brain. If you are overweight or obese, work with your health care provider to lose weight. Take over-the-counter and prescription medicines only as told by your health care provider. This  information is not intended to replace advice given to you by your health care provider. Make sure  you discuss any questions you have with your health care provider. Document Revised: 09/07/2019 Document Reviewed: 09/07/2019 Elsevier Patient Education  [place]. Chiari Malformation  Chiari malformation (CM) is a type of brain abnormality that affects the parts of the brain called the cerebellum and the brain stem. The cerebellum is important for balance, and the brain stem is important for basic body functions, such as breathing and swallowing. Normally, the cerebellum is located in a space at the back of the skull, just above the opening in the skull (foramen magnum)where the spinal cord meets the brain stem. With CM, part of the cerebellum is located below the foramen magnum instead. The malformation can be mild with no or few symptoms, or it can be severe. CM can cause neck pain, headaches, balance problems, and other symptoms. What are the causes? CM is a condition that a person is born with (congenital). In rare cases, CM may also develop later in life, which is called acquired CM or secondary CM. These cases may be caused by a leak of the fluid that surrounds and protects the brain and spinal cord (cerebrospinal fluid), leading to low pressure. In acquired or secondary CM, abnormal pressure develops in the brain. This pushes the cerebellum down into the foramen magnum. What increases the risk? The following factors may make you more likely to develop this condition: Being female. Having a family history of CM. What are the signs or symptoms? Symptoms of this condition may vary depending on the severity of your CM. In some cases, there are no symptoms. In other cases, symptoms may come and go. The most common symptom is a severe headache in the back of the head. The headache: May come and go. May spread to your neck and shoulders. May be worse when you cough, sneeze, or strain. Other symptoms include: Difficulty balancing or loss of coordination. Vision problems, such as double  vision, tiny spots moving across your vision (floaters), or sensitivity to lights. Trouble swallowing or speaking or hearing. Feeling dizzy or fainting. Breathing problems, including breathing pauses during sleep (sleep apnea). Curved back (scoliosis). Inability to control when you urinate (incontinence). How is this diagnosed? This condition may be evaluated with a medical history and physical exam. This may include tests to check your balance and nerves (neurological exam). You may also have imaging tests, such as a CT scan or an MRI. How is this treated? Treatment for this condition depends on the severity of your symptoms. You may be treated with: Surgery to prevent the malformation from getting worse, or to treat severe symptoms or symptoms that are getting worse. Medicines or alternative treatments to relieve headaches or neck pain. If you do not have symptoms, you may not need treatment. Follow these instructions at home: Medicines Take over-the-counter and prescription medicines only as told by your health care provider. Ask your health care provider if the medicine prescribed to you requires you to avoid driving or using machinery. General instructions If you feel like you might faint: Lie down right away and raise (elevate) your feet above the level of your heart. Breathe deeply and steadily. Wait until all of the symptoms have passed. If you have problems with dizziness, get up slowly when lying down. Take several minutes to sit and then stand. Ask your health care provider which activities are safe for you and if you have any activity  restrictions. Do not use any products that contain nicotine or tobacco. These products include cigarettes, chewing tobacco, and vaping devices, such as e-cigarettes. If you need help quitting, ask your health care provider. Drink enough fluid to keep your urine pale yellow. Consider joining a CM support group. Keep all follow-up visits. This is  important. Where to find more information [employer] of Neurological Disorders and Stroke: [employer] Contact a health care provider if: You have new symptoms. Your symptoms get worse. Get help right away if: You develop weakness or numbness in one or more of your limbs. You develop dizziness, slurred speech, double vision, weakness, or numbness with a severe headache. These symptoms may represent a serious problem that is an emergency. Do not wait to see if the symptoms will go away. Get medical help right away. Call your local emergency services (911 in the [place] Summary A Chiari malformation is a condition in which part of the cerebellum moves down through the foramen magnum. The malformation can be mild with no symptoms, or it can be severe. In some cases, no treatment is needed. In others, medicines are used to treat headaches. Surgery is done in the worst of cases. This information is not intended to replace advice given to you by your health care provider. Make sure you discuss any questions you have with your health care provider. Document Revised: 12/22/2020 Document Reviewed: 12/22/2020 Elsevier Patient Education  [place].

## 2022-07-25 NOTE — Telephone Encounter (Signed)
..   Pt understands that although there may be some limitations with this type of visit, we will take all precautions to reduce any security or privacy concerns.  Pt understands that this will be treated like an in office visit and we will file with pt's insurance, and there may be a patient responsible charge related to this service. ? ?

## 2022-07-25 NOTE — Progress Notes (Signed)
Reason for visit: Headache, Arnold-Chiari malformation  Virtual Visit via Video Note  I connected with [name] on 07/25/22 at  8:30 AM EDT by a video enabled telemedicine application and verified that I am speaking with the correct person using two identifiers.  Location: Patient: home Provider: office   I discussed the limitations of evaluation and management by telemedicine and the availability of in person appointments. The patient expressed understanding and agreed to proceed.   Follow Up Instructions:    I discussed the assessment and treatment plan with the patient. The patient was provided an opportunity to ask questions and all were answered. The patient agreed with the plan and demonstrated an understanding of the instructions.   The patient was advised to call back or seek an in-person evaluation if the symptoms worsen or if the condition fails to improve as anticipated.  I provided 20 minutes of non-face-to-face time during this encounter.   Melvenia Beam, MD  07/25/2022: She is seeing Dr. Katy Fitch in December for a follow up. She has lost 30 pounds and the headaches are so much better. She had bariatric surgery, her vision is fine no loss of vision, hedaches are much better, she continue to los weight, tolerating the diamox but she was not able to make it to '500mg'$  all she could do it 1.5 pills that's all she can tolerate we can keep here since doing well and losing weight.Initially felt like her head was going to explode now she is doing great. We spoke about decompression but if losing weight and doing well on the diamox, and if she lose enough weight we may be able to take her off of the diamox as we monitor, Seeing Dr. Katy Fitch in December see [place] in January since doing well. Will do it in the office.  Patient complains of symptoms per HPI as well as the following symptoms: numbness and tingling in the toes . Pertinent negatives and positives per HPI. All others  negative   RONETTA MOLLA is an 36 y.o. female HPU  Since I last saw her she has seen neurosurgery, seen dr Zada Finders and he recommended no shunt, recommended seeing Dr. Katy Fitch. Saw Dr. Katy Fitch and he said weight loss and diamox. Luckily doing great on the diamox, and feeling much better, stable vision according to dr Katy Fitch, no worsening vision. Her symptoms as far as vision better, pressure headaches better but still some occipital sensitization sometimes. She is on diamox once daily '250mg'$  try 1.5 at bedtine then may increase to '500mg'$ .   Comorbidities to obesity include IDIOPATHIC INTRACRANIAL HYPERTENSION, High Cholesterol, HTN, sleep apnea.Hgba1c 8, tried   In the past he has used Trulicity, victoza, phentermine, glipizide, glyburide,metformin, Glimepiride, invokana, jardiance, januvia, Pioglitazone, victoza,, Ozempic and Saxenda with nausea and he has also tried metformin with diarrhea.,rybelsus, orlistat (Xenical, Alli), phentermine-topiramate (Qsymia), naltrexone-bupropion (Contrave), and semaglutide [facility]), gliflozins, actos, also has been involved in a formal weight loss program for > 1 year (weight watchers, bariatric clinic, healthy weight and wellness center, [facility], [facility] healthy weight loss, noom with great compliance)  MRI 03/12/2022:IMPRESSION: Unremarkable MRI scan of the brain with and without contrast showing type I Arnold-Chiari malformation with low-lying cerebellar tonsils as well as subtle enlargement of the optic nerve sheaths and partially empty sella which are incidental findings.  No significant change compared with previous MRI dated 05/01/2021.  CTV 03/12/2022: IMPRESSION: 1. 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 2. No thrombus is present. 3. The superior sagittal sinus, straight sinus, right transverse sinus and deep cerebral veins are patent.   Patient complains of symptoms per HPI as well as the following symptoms:  obesity . Pertinent negatives and positives per HPI. All others negative   02/28/2022: Patient is here today, transitioning to my care from Dr. Jannifer Franklin,  and has an 40mchiari malformation that appears peg like. She saw optometry who believes she may have [place].  She has not seen an ophthalmology. Headaches feel like behind most eye, pressure in the back of the head, hurts when she sneeezes, neck pain, pressure, headaches hurt with bearing down. She has has also had migraines since childhood, but these are different. With her migraines, she has light and sound sensitivity, frontal area but now with more pressure. Tried topiramate in the past. Has tried Topiramate in 2022 with Dr. WJannifer Franklinand in 2014/2015 with Dr. SMoshe CiproNo episodes of blurry vision. Daily headaches, ongoing for years, now more pressure than with migrainous features and getting more intense.   MRI brain 05/03/2021: personally reviewed images and agree and reviewed images with patient as well: IMPRESSION: This MRI of the brain without contrast shows the following: 1.   Chiari malformation with 11 mm of cerebellar ectopia associated with some stenosis at the foramen magnum but no abnormal signal within the brainstem, spinal cord or cerebellum.  Compared to the MRI from 2017, there has been slight progression of an additional 1 mm. 2.   The brain is otherwise normal.    Patient complains of symptoms per HPI as well as the following symptoms: worsening headaches, chiari malformation . Pertinent negatives and positives per HPI. All others negative   History of present illness: Dr. WJannifer Franklin Ms. ASpizzirriis a 36year old right-handed black female with a history of headaches.  She is having some form of headache on a daily basis.  She in the past has a history of Arnold-Chiari malformation, she has reported pressure sensations coming of the back of the head with coughing or sneezing or straining, she may see black spots in front of the eyes at  times.  She also has what sounds like 2 migraine headaches with photophobia and phonophobia and some nausea.  These may occur 2 or 3 times a month, but she is having some form of headache on a daily basis.  Occasionally when she turns her head, she may get a headache.  Again this is brief in nature.  The patient recently had right ankle surgery, she is recovering from this, not fully weightbearing yet.  She comes back here for an evaluation.  She was placed on Topamax previously but could not tolerate the drug and went off the medication.  She may take Fioricet if needed for the headache.  Past Medical History:  Diagnosis Date   Adrenal tumor    Anemia    Phreesia 08/12/2020   Arthritis    Back pain    Borderline diabetes 2011   r/t adrenal tumor, now resolved   Carpal tunnel syndrome during pregnancy    Chest pain    [place] malformation type I ([place] 2015   on MRI   Constipation    [place]ushing syndrome ([place] 2011   r/t adrenal tumor; tumor removed, disease resolved   Cushing's syndrome ([place] 11/27/2013   Gallbladder problem    Gastroesophageal reflux disease    GERD (gastroesophageal reflux disease)    Phreesia 08/12/2020   Headache  Heart murmur    Phreesia 08/12/2020   Hematuria 09/18/2015   Hemorrhoids    History of Chiari malformation    Hypertension    Phreesia 08/12/2020   Internal hemorrhoids with other complication 41/63/8453   JAN 2015 R ANT MAR 2015 R POS AND L LAT BAND    Menstrual periods irregular 09/18/2015   Migraine without aura, with intractable migraine, so stated, without mention of status migrainosus 11/11/2013   Palpitations    Patient desires pregnancy 11/27/2013   Plantar fasciitis    Pre-diabetes    Prediabetes    Preeclampsia 05/21/2019   2x/wk testing nst alt w/ bpp/dopp      Deliver @ 37wks        IOL scheduled for 8/19 @ 0830   Pregnant 12/16/2015   Sleep apnea    SOBOE (shortness of breath on exertion)    Swelling of both lower extremities     Vitamin D deficiency     Past Surgical History:  Procedure Laterality Date   arthroscopic treatment of osteochondral defect of right ankle  03/2021   Bilateral plantar fascial [place]  03/2021   CHOLECYSTECTOMY  02/2015   COLONOSCOPY N/A 04/19/2013   MIW:OEHOZY mucosa in the terminal ileum/Single erosion in transverse colon/RECTAL BLEEDING DUE TO Moderate sized internal hemorrhoids/ trv colon erosion, bx benign.   COLONOSCOPY WITH PROPOFOL N/A 08/31/2020   Non-bleeding internal hemorrhoids. Colon torturous.    ESOPHAGOGASTRODUODENOSCOPY N/A 04/19/2013   YQM:GNOI Non-erosive gastritis/PERI-UMBILICAL PAIN DUE TO GERD, GASTRITIS, AND CONSTIPATION. Bx with mild chronic inactive gastritis. No H.Pylori   GASTRIC ROUX-EN-Y N/A 06/21/2022   Procedure: LAPAROSCOPIC ROUX-EN-Y GASTRIC BYPASS WITH UPPER ENDOSCOPY WITH HIATAL HERNIA REPAIR;  Surgeon: [name], MD;  Location: [facility];  Service: General;  Laterality: N/A;   HEMORRHOID BANDING  2015   Dr. Gala Romney- in office banding procedure   LAPAROSCOPIC ADRENALECTOMY  10/28/2010   TONSILLECTOMY     tumor removed left adrenal gland 01/12      Family History  Problem Relation Age of Onset   Hypertension Mother    Hyperlipidemia Mother    Colon polyps Mother    Hypertension Father    Colon polyps Father    Sleep apnea Father    Obesity Father    Deep vein thrombosis Sister    Hypertension Maternal Grandmother    Diabetes Maternal Grandmother    Deep vein thrombosis Maternal Grandmother    Diabetes Paternal Grandmother    COPD Paternal Grandfather    Heart disease Paternal Grandfather    Other Daughter        pulmonary valve stenosis   Stroke Maternal Aunt    Migraines Maternal Aunt    Colon cancer Other        maternal great uncle    Social history:  reports that she has never smoked. She has never used smokeless tobacco. She reports that she does not drink alcohol and does not use drugs.    Allergies  Allergen Reactions    Methylprednisolone Shortness Of Breath, Itching and Other (See Comments)    Reaction:  Bruising    Penicillins Anaphylaxis, Rash and Other (See Comments)    Has patient had a PCN reaction causing immediate rash, facial/tongue/throat swelling, SOB or lightheadedness with hypotension: No Has patient had a PCN reaction causing severe rash involving mucus membranes or skin necrosis: No Has patient had a PCN reaction that required hospitalization No Has patient had a PCN reaction occurring within the last 10 years: No If  all of the above answers are "NO", then may proceed with Cephalosporin use.   Wound Dressing Adhesive     Burns skin   Latex Rash   Neomycin Rash    Breaks out with neosporin    Medications:  Prior to Admission medications   Medication Sig Start Date End Date Taking? Authorizing Provider  ergocalciferol (VITAMIN D2) 1.25 MG (50000 UT) capsule 1 po q wed, and 1 po q sun 01/05/21  Yes Opalski, Deborah, DO  Multiple Vitamin (MULTIVITAMIN) tablet Take 1 tablet by mouth daily.   Yes [provider]  pantoprazole (PROTONIX) 40 MG tablet Take one tablet by mouth once daily, as needed, for heartburn Patient taking differently: Take 40 mg by mouth daily. 06/30/20  Yes [name], MD  butalbital-acetaminophen-caffeine (FIORICET) [phone] MG tablet Take one tablet up to two times daily , as needed, for severe headache Patient not taking: Reported on 04/26/2021 08/13/20   [name], MD    There were no vitals taken for this visit.   Physical exam: Exam: Gen: NAD, conversant      CV:  Denies palpitations or chest pain or SOB. VS: Breathing at a normal rate.. Not febrile. Eyes: Conjunctivae clear without exudates or hemorrhage  Neuro: Detailed Neurologic Exam  Speech:    Speech is normal; fluent and spontaneous with normal comprehension.  Cognition:    The patient is oriented to person, place, and time;     recent and remote memory intact;      language fluent;     normal attention, concentration,     fund of knowledge Cranial Nerves:    The pupils are equal, round, and reactive to light. Attempted, Cannot perform fundoscopic exam. Visual fields are full to finger confrontation. Extraocular movements are intact.  The face is symmetric with normal sensation. The palate elevates in the midline. Hearing intact. Voice is normal. Shoulder shrug is normal. The tongue has normal motion without fasciculations.   Coordination:    Normal finger to nose  Gait:    Normal native gait  Motor Observation:   no involuntary movements noted. Tone:    Appears normal  Posture:    Posture is normal. normal erect    Strength:    Strength is anti-gravity and symmetric in the upper and lower limbs.      Sensation: intact to LT      Assessment/Plan: Morbidly obese patient is here today for follow up, transitioned to my care from Dr. Jannifer Franklin, recent weight gain, hx of chiari malformation, last MRI showed 58mchiari malformation that appears peg like. She saw ophthalmology and neurosurgery since last visit, there is some ONH edema stable. Headaches pressure in the back of the head, hurts when she sneeezes, neck pain, pressure, headaches hurt with bearing down. She has has also had migraines since childhood, but these worsening headaches are different.  Daily headaches, ongoing for years, now more pressure than with migrainous features and getting more intense.  Unclear if symptoms due to IDIOPATHIC INTRACRANIAL HYPERTENSION or chiari or focal stenosis (see CTV). We will get her to lose weight, cont diamox and follow.  Update 07/25/2022: She is seeing Dr. GKaty Fitchin December for a follow up. She has lost 30 pounds and the headaches are so much better. She had bariatric surgery, her vision is fine no loss of vision, hedaches are much better, she continue to los weight, tolerating the diamox but she was not able to make it to '500mg'$  all she  could do it 1.5 pills  that's all she can tolerate we can keep here since doing well and losing weight.Initially felt like her head was going to explode now she is doing great. We spoke about decompression but if losing weight and doing well on the diamox, and if she lose enough weight we may be able to take her off of the diamox as we monitor, Seeing Dr. Katy Fitch in December see [place] in January since doing well. Will do it in the office.  - f/u jan in office with ,me, sees Dr. Katy Fitch in December   - doing better on diamox,cannot tolerate higher dose will monitor, is losing weight. Will continue to follow with dr Katy Fitch and dr Zada Finders - Repeat CTV for follow focal stenosis after she loses weight, possibly 6 months to a year, can consider hiari decompression, venous stenting? Will have to follow and see what happens - Obesity is risk factor: had surgery, discussed moujaro and ozempic f/u with bariatric for further management of weight(Comorbidities to obesity include IDIOPATHIC INTRACRANIAL HYPERTENSION, High Cholesterol, HTN, sleep apnea.Hgba1c 8, tried   In the past he has used Trulicity, victoza, phentermine, glipizide, glyburide,metformin, Glimepiride, invokana, jardiance, januvia, Pioglitazone, victoza,, Ozempic and Saxenda with nausea and he has also tried metformin with diarrhea.,rybelsus, orlistat (Xenical, Alli), phentermine-topiramate (Qsymia), naltrexone-bupropion (Contrave), and semaglutide [facility]), gliflozins, actos, also has been involved in formal weight loss programs for > 1 year (weight watchers, bariatric clinic, healthy weight and wellness center, [facility], [facility] healthy weight loss, noom with great compliance)) - Hold off on Lumbar puncture for opening pressure, too risky given chiari  personally reviewed images and agree and reviewed images with patient as well: (additional 15 minutes reviewed images prior to appointment) MRI 03/12/2022:IMPRESSION: Unremarkable MRI scan of the brain with and without contrast  showing type I Arnold-Chiari malformation with low-lying cerebellar tonsils as well as subtle enlargement of the optic nerve sheaths and partially empty sella which are incidental findings.  No significant change compared with previous MRI dated 05/01/2021.  CTV 03/12/2022: IMPRESSION: 1. 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 2. No thrombus is present. 3. The superior sagittal sinus, straight sinus, right transverse sinus and deep cerebral veins are patent.  Sarina Ill MD  Meds ordered this encounter  Medications   acetaZOLAMIDE (DIAMOX) 250 MG tablet    Sig: Take 1/2 pill in the morning and 1 pill at bedtime. If side effects can take all in the evening. Also try to get to 2 pills a day preferably one pill twice daily but could be all at night if can't take pill in day due to side effects.    Dispense:  180 tablet    Refill:  4   Cc: Dr. Moshe Cipro, [facility] [place], [place] [address]  Phone [phone] Fax [phone]  I spent over 80 minutes of face-to-face and non-face-to-face time with patient on the  1. Papilledema   2. Chronic nonintractable headache, unspecified headache type   3. Chiari malformation type I ([place])   4. IIH (idiopathic intracranial hypertension)      diagnosis.  This included previsit chart review, lab review, study review, order entry, electronic health record documentation, patient education on the different diagnostic and therapeutic options, counseling and coordination of care, risks and benefits of management, compliance, or risk factor reduction

## 2022-07-25 NOTE — Telephone Encounter (Signed)
Schedule her for in office follow up please dr Jaynee Eagles mid January thanks she can have a 730 if that works for her or end of day you can add a 4pm

## 2022-07-25 NOTE — Telephone Encounter (Signed)
Pt scheduled for in office follow up with Dr. Jaynee Eagles on 10/20/2022 at 1:00pm

## 2022-07-28 ENCOUNTER — Ambulatory Visit: Payer: 59 | Admitting: Family Medicine

## 2022-08-02 ENCOUNTER — Ambulatory Visit (INDEPENDENT_AMBULATORY_CARE_PROVIDER_SITE_OTHER): Payer: 59 | Admitting: Family Medicine

## 2022-08-02 ENCOUNTER — Encounter: Payer: Self-pay | Admitting: Family Medicine

## 2022-08-02 VITALS — BP 112/80 | HR 83 | Ht 64.0 in | Wt 207.0 lb

## 2022-08-02 DIAGNOSIS — I82712 Chronic embolism and thrombosis of superficial veins of left upper extremity: Secondary | ICD-10-CM

## 2022-08-02 DIAGNOSIS — K219 Gastro-esophageal reflux disease without esophagitis: Secondary | ICD-10-CM

## 2022-08-02 DIAGNOSIS — Z23 Encounter for immunization: Secondary | ICD-10-CM

## 2022-08-02 DIAGNOSIS — E7849 Other hyperlipidemia: Secondary | ICD-10-CM

## 2022-08-02 DIAGNOSIS — I10 Essential (primary) hypertension: Secondary | ICD-10-CM

## 2022-08-02 DIAGNOSIS — R7303 Prediabetes: Secondary | ICD-10-CM

## 2022-08-02 DIAGNOSIS — I82890 Acute embolism and thrombosis of other specified veins: Secondary | ICD-10-CM

## 2022-08-02 DIAGNOSIS — G932 Benign intracranial hypertension: Secondary | ICD-10-CM

## 2022-08-02 DIAGNOSIS — E559 Vitamin D deficiency, unspecified: Secondary | ICD-10-CM

## 2022-08-02 NOTE — Patient Instructions (Addendum)
F/U mid January, call if you need me sooner  Labs today, vit D, cmp and EGFR, cBC and TSH and magnesium and lipid  Flu vaccine today  Thankful surgery has been succesful, keep fluid and protein intake up   Please get covid vaccine at your pharmacy  Resume aspirin coated 81 mg daily, take for an additional 2 weeks  You are referred to vascular for superficial thrombosis  You are referred for removal of skin tag

## 2022-08-03 LAB — CMP14+EGFR
ALT: 21 IU/L (ref 0–32)
AST: 25 IU/L (ref 0–40)
Albumin/Globulin Ratio: 1.5 (ref 1.2–2.2)
Albumin: 3.9 g/dL (ref 3.9–4.9)
Alkaline Phosphatase: 71 IU/L (ref 44–121)
BUN/Creatinine Ratio: 9 (ref 9–23)
BUN: 9 mg/dL (ref 6–20)
Bilirubin Total: 0.3 mg/dL (ref 0.0–1.2)
CO2: 23 mmol/L (ref 20–29)
Calcium: 8.8 mg/dL (ref 8.7–10.2)
Chloride: 105 mmol/L (ref 96–106)
Creatinine, Ser: 1 mg/dL (ref 0.57–1.00)
Globulin, Total: 2.6 g/dL (ref 1.5–4.5)
Glucose: 93 mg/dL (ref 70–99)
Potassium: 3.4 mmol/L — ABNORMAL LOW (ref 3.5–5.2)
Sodium: 143 mmol/L (ref 134–144)
Total Protein: 6.5 g/dL (ref 6.0–8.5)
eGFR: 75 mL/min/{1.73_m2} (ref >59)

## 2022-08-03 LAB — MAGNESIUM: Magnesium: 2.2 mg/dL (ref 1.6–2.3)

## 2022-08-03 LAB — LIPID PANEL
Chol/HDL Ratio: 3.4 ratio (ref 0.0–4.4)
Cholesterol, Total: 138 mg/dL (ref 100–199)
HDL: 41 mg/dL (ref >39)
LDL Chol Calc (NIH): 79 mg/dL (ref 0–99)
Triglycerides: 94 mg/dL (ref 0–149)
VLDL Cholesterol Cal: 18 mg/dL (ref 5–40)

## 2022-08-03 LAB — CBC
Hematocrit: 34.7 % (ref 34.0–46.6)
Hemoglobin: 11.8 g/dL (ref 11.1–15.9)
MCH: 27 pg (ref 26.6–33.0)
MCHC: 34 g/dL (ref 31.5–35.7)
MCV: 79 fL (ref 79–97)
Platelets: 316 10*3/uL (ref 150–450)
RBC: 4.37 x10E6/uL (ref 3.77–5.28)
RDW: 16 % — ABNORMAL HIGH (ref 11.7–15.4)
WBC: 6.9 10*3/uL (ref 3.4–10.8)

## 2022-08-03 LAB — VITAMIN D 25 HYDROXY (VIT D DEFICIENCY, FRACTURES): Vit D, 25-Hydroxy: 48.2 ng/mL (ref 30.0–100.0)

## 2022-08-03 LAB — TSH: TSH: 0.858 u[IU]/mL (ref 0.450–4.500)

## 2022-08-04 ENCOUNTER — Other Ambulatory Visit: Payer: Self-pay | Admitting: *Deleted

## 2022-08-04 DIAGNOSIS — R609 Edema, unspecified: Secondary | ICD-10-CM

## 2022-08-05 ENCOUNTER — Ambulatory Visit: Payer: 59 | Admitting: Adult Health

## 2022-08-05 ENCOUNTER — Ambulatory Visit: Payer: 59 | Admitting: Obstetrics & Gynecology

## 2022-08-07 ENCOUNTER — Encounter: Payer: Self-pay | Admitting: Family Medicine

## 2022-08-07 DIAGNOSIS — I82712 Chronic embolism and thrombosis of superficial veins of left upper extremity: Secondary | ICD-10-CM | POA: Insufficient documentation

## 2022-08-07 NOTE — Assessment & Plan Note (Signed)
perisitent pain and swelling x 6 weeks, refer vascular surgery, continue aspirin 81 mg

## 2022-08-07 NOTE — Progress Notes (Signed)
Janet [name]     MRN: [id number]      DOB: March 30, 1986   HPI Janet [name] is here for follow up and re-evaluation of chronic medical conditions, medication management and review of any available recent lab and radiology data.  Preventive health is updated, specifically  Cancer screening and Immunization.   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 Doing well overall, very significant weight loss since, and is able to eat very small amt of food, no vomiting, feels better overall Complication of left forearm pain and swelling were she had IV access which is persisting. Imaging revealed superficial phlebitis, on baby aspirin for this but still symptomatic  ROS Denies recent fever or chills. Denies sinus pressure, nasal congestion, ear pain or sore throat. Denies chest congestion, productive cough or wheezing. Denies chest pains, palpitations and leg swelling .   Denies dysuria, frequency, hesitancy or incontinence.  Denies headaches, seizures, numbness, or tingling. Denies depression, anxiety or insomnia. Denies skin break down or rash.   PE  BP 112/80   Pulse 83   Ht '5\' 4"'$  (1.626 m)   Wt 207 lb (93.9 kg)   SpO2 98%   BMI 35.53 kg/m   Patient alert and oriented and in no cardiopulmonary distress.  HEENT: No facial asymmetry, EOMI,     Neck supple .  Chest: Clear to auscultation bilaterally.  CVS: S1, S2 no murmurs, no S3.Regular rate.  ABD: Soft non tender.   Ext: No edema  MS: Adequate ROM spine, shoulders, hips and knees.Tender over left  forearm along vein  Skin: Intact, no ulcerations or rash noted.  Psych: Good eye contact, normal affect. Memory intact not anxious or depressed appearing.  CNS: CN 2-12 intact, power,  normal throughout.no focal deficits noted.   Assessment & Plan  Thrombosis superficial vein, arm, chronic, left perisitent pain and swelling x 6 weeks, refer vascular surgery, continue aspirin 81 mg  Morbid obesity  ([place])  Patient re-educated about  the importance of commitment to a  minimum of 150 minutes of exercise per week as able.  The importance of healthy food choices with portion control discussed, as well as eating regularly and within a 12 hour window most days. The need to choose "clean , green" food 50 to 75% of the time is discussed, as well as to make water the primary drink and set a goal of 64 ounces water daily.       08/02/2022    4:06 PM 07/19/2022    1:40 PM 07/05/2022    5:25 PM  Weight /BMI  Weight 207 lb  219 lb 1.6 oz  Height '5\' 4"'$  (1.626 m)  '5\' 4"'$  (1.626 m)  BMI 35.53 kg/m2  37.61 kg/m2     Information is confidential and restricted. Go to Review Flowsheets to unlock data.      Hypertension DASH diet and commitment to daily physical activity for a minimum of 30 minutes discussed and encouraged, as a part of hypertension management. The importance of attaining a healthy weight is also discussed.     08/02/2022    4:20 PM 08/02/2022    4:06 PM 07/19/2022    5:17 PM 07/19/2022    1:40 PM 07/05/2022    5:25 PM 06/23/2022    6:11 AM 06/23/2022    2:22 AM  BP/Weight  Systolic BP 147 829    562 130  Diastolic BP 80 94    82 57  Wt. (Lbs)  207  219.1    BMI  35.53 kg/m2   37.61 kg/m2       Information is confidential and restricted. Go to Review Flowsheets to unlock data.       Gastroesophageal reflux disease Controlled, no change in medication   Vitamin D deficiency Updated lab needed at/ before next visit.

## 2022-08-07 NOTE — Assessment & Plan Note (Signed)
Updated lab needed at/ before next visit.   

## 2022-08-07 NOTE — Assessment & Plan Note (Signed)
  Patient re-educated about  the importance of commitment to a  minimum of 150 minutes of exercise per week as able.  The importance of healthy food choices with portion control discussed, as well as eating regularly and within a 12 hour window most days. The need to choose "clean , green" food 50 to 75% of the time is discussed, as well as to make water the primary drink and set a goal of 64 ounces water daily.       08/02/2022    4:06 PM 07/19/2022    1:40 PM 07/05/2022    5:25 PM  Weight /BMI  Weight 207 lb  219 lb 1.6 oz  Height '5\' 4"'$  (1.626 m)  '5\' 4"'$  (1.626 m)  BMI 35.53 kg/m2  37.61 kg/m2     Information is confidential and restricted. Go to Review Flowsheets to unlock data.

## 2022-08-07 NOTE — Assessment & Plan Note (Signed)
DASH diet and commitment to daily physical activity for a minimum of 30 minutes discussed and encouraged, as a part of hypertension management. The importance of attaining a healthy weight is also discussed.     08/02/2022    4:20 PM 08/02/2022    4:06 PM 07/19/2022    5:17 PM 07/19/2022    1:40 PM 07/05/2022    5:25 PM 06/23/2022    6:11 AM 06/23/2022    2:22 AM  BP/Weight  Systolic BP 122 241    146 431  Diastolic BP 80 94    82 57  Wt. (Lbs)  207   219.1    BMI  35.53 kg/m2   37.61 kg/m2       Information is confidential and restricted. Go to Review Flowsheets to unlock data.

## 2022-08-07 NOTE — Assessment & Plan Note (Signed)
Controlled, no change in medication  

## 2022-08-08 NOTE — Addendum Note (Signed)
Encounter addended by: Kathyrn Drown, RN on: 08/08/2022 1:35 PM  Actions taken: Charge Capture section accepted

## 2022-08-10 ENCOUNTER — Other Ambulatory Visit (HOSPITAL_COMMUNITY): Payer: Self-pay | Admitting: General Surgery

## 2022-08-10 DIAGNOSIS — R160 Hepatomegaly, not elsewhere classified: Secondary | ICD-10-CM

## 2022-08-11 ENCOUNTER — Other Ambulatory Visit: Payer: Self-pay | Admitting: Student

## 2022-08-11 DIAGNOSIS — E860 Dehydration: Secondary | ICD-10-CM

## 2022-08-11 NOTE — Progress Notes (Signed)
Orders placed for IV fluids and CMP.

## 2022-08-12 ENCOUNTER — Ambulatory Visit (HOSPITAL_COMMUNITY)
Admission: RE | Admit: 2022-08-12 | Discharge: 2022-08-12 | Disposition: A | Discharge status: Home / Self Care | Payer: 59 | Source: Ambulatory Visit | Attending: General Surgery | Admitting: General Surgery

## 2022-08-12 ENCOUNTER — Ambulatory Visit (INDEPENDENT_AMBULATORY_CARE_PROVIDER_SITE_OTHER): Payer: 59

## 2022-08-12 ENCOUNTER — Telehealth: Payer: Self-pay | Admitting: Dietician

## 2022-08-12 ENCOUNTER — Other Ambulatory Visit (INDEPENDENT_AMBULATORY_CARE_PROVIDER_SITE_OTHER): Payer: 59

## 2022-08-12 VITALS — BP 128/80 | HR 61 | Temp 97.9°F | Resp 16 | Ht 64.0 in | Wt 205.6 lb

## 2022-08-12 DIAGNOSIS — R160 Hepatomegaly, not elsewhere classified: Secondary | ICD-10-CM | POA: Diagnosis present

## 2022-08-12 DIAGNOSIS — E860 Dehydration: Secondary | ICD-10-CM

## 2022-08-12 LAB — COMPREHENSIVE METABOLIC PANEL
ALT: 14 U/L (ref 0–35)
AST: 20 U/L (ref 0–37)
Albumin: 3.9 g/dL (ref 3.5–5.2)
Alkaline Phosphatase: 58 U/L (ref 39–117)
BUN: 8 mg/dL (ref 6–23)
CO2: 25 mEq/L (ref 19–32)
Calcium: 9 mg/dL (ref 8.4–10.5)
Chloride: 108 mEq/L (ref 96–112)
Creatinine, Ser: 0.8 mg/dL (ref 0.40–1.20)
GFR: 94.54 mL/min (ref >60.00)
Glucose, Bld: 135 mg/dL — ABNORMAL HIGH (ref 70–99)
Potassium: 3.1 mEq/L — ABNORMAL LOW (ref 3.5–5.1)
Sodium: 140 mEq/L (ref 135–145)
Total Bilirubin: 0.4 mg/dL (ref 0.2–1.2)
Total Protein: 6.9 g/dL (ref 6.0–8.3)

## 2022-08-12 MED ORDER — ONDANSETRON HCL 4 MG/2ML IJ SOLN: 4.0000 mg | INTRAMUSCULAR | Status: DC | PRN

## 2022-08-12 MED ORDER — SODIUM CHLORIDE 0.9 % IV BOLUS
1000.0000 mL | Freq: Once | INTRAVENOUS | Status: AC
  Administered 2022-08-12: 1000 mL via INTRAVENOUS
  Filled 2022-08-12: qty 1000

## 2022-08-12 MED ORDER — THIAMINE HCL 100 MG/ML IJ SOLN
Freq: Once | INTRAVENOUS | Status: AC
  Filled 2022-08-12: qty 1000

## 2022-08-12 MED ORDER — ONDANSETRON 4 MG PO TBDP: 4.0000 mg | ORAL_TABLET | ORAL | Status: DC | PRN

## 2022-08-12 NOTE — Progress Notes (Signed)
Diagnosis: Dehydration  Provider:  Marshell Garfinkel MD  Procedure: Infusion/Labs  IV Type: Peripheral, IV Location: R Antecubital  Banana Bag, Dose: 1000 ml  Infusion Start Time: 1115  Infusion Stop Time: 1220   Normal Saline, Dose: 1000 ml  Infusion Start Time: 3903  Infusion Stop Time: 0092  Post Infusion IV Care: Peripheral IV Discontinued  Discharge: Condition: Good, Destination: Home . AVS provided to patient.   Performed by:  Adelina Mings, LPN

## 2022-08-12 NOTE — Telephone Encounter (Signed)
Dietitian called to follow up with pt. Pt states surgeon ordered a GI test, stating she is heading to get an IV fluids now.  Pt states she does not like veggie burgers. Pt states when she tried beans, she did not tolerate well. Pt agreeable to try tofu after IV session today. Pt states she can only take two bites and she feels full. Pt states Slim Jim's are her go to right now, stating she is tolerating them. Pt states she does not like protein shakes now. Pt states after her first IV session, she started tolerating things better, stating that she thinks when she is dehydrated things get worse. Pt scheduled to meet with Dietitian next week. Pt agreeable to tracking fluid and protein intake starting today until visit next week.

## 2022-08-15 ENCOUNTER — Other Ambulatory Visit: Payer: Self-pay | Admitting: Family Medicine

## 2022-08-15 DIAGNOSIS — E559 Vitamin D deficiency, unspecified: Secondary | ICD-10-CM

## 2022-08-16 ENCOUNTER — Encounter: Payer: 59 | Attending: General Surgery | Admitting: Skilled Nursing Facility1

## 2022-08-16 ENCOUNTER — Encounter: Payer: Self-pay | Admitting: Skilled Nursing Facility1

## 2022-08-16 NOTE — Progress Notes (Unsigned)
Bariatric Nutrition Follow-Up Visit Medical Nutrition Therapy  Surgery date: 06/21/2022 Surgery type: RYGB  Anthropometrics  Start weight at [facility]: 248.4 lbs (date: 04/08/2022)   Weight today: 203.8 pounds   Clinical  Medical hx: Sleep apnea, obesity, GERD, Nerve/muscle disease, anemia, heart murmur, Cushing's Syndrome Medications: Vit D  Labs: per patient 09 Mar 2022: Vit D 15.6; A1C 6.1; Chol 179; LDL 106; HDL 48 Notable signs/symptoms: none noted Any previous deficiencies? No   Body Composition Scale 07/05/2022 08/16/2022  Current Body Weight 219.1 203.8  Total Body Fat % 41.4 39  Visceral Fat 11 10  Fat-Free Mass % 58.5 60.9   Total Body Water % 43.7 44.9  Muscle-Mass lbs 32.1 32.2  BMI 37.5 34.8  Body Fat Displacement           Torso  lbs 56.2 49.2         Left Leg  lbs 11.2 9.8         Right Leg  lbs 11.2 9.8         Left Arm  lbs 5.6 4.9         Right Arm   lbs 5.6 4.9     Lifestyle & Dietary Hx  Pt states she feels her best when she does not eat. Pt states she has been vomiting and having diarrhea.  Pt states her surgeon is sending her for an upper GI. Pt states she cannot tolerate protein shakes she spits them back up. Pt states slim jims go very well.  Pt states a mass was found on her liver so this is being investigated.   Pt states she is excited to try different foods: Dietitian advised pt to call if this plan does not work for her.   Estimated daily fluid intake: 64 oz Estimated daily protein intake: 45 g Supplements: multi and calcium  Current average weekly physical activity: 60 minutes 3-5 days a week   24-Hr Dietary Recall First Meal:  Snack:   Second Meal 2pm: slim jim Snack:   Third Meal 5pm: chicken Snack:  Beverages: cold water + lemon  Post-Op Goals/ Signs/ Symptoms Using straws: no Drinking while eating: no Chewing/swallowing difficulties: no Changes in vision: no Changes to mood/headaches: no Hair loss/changes to skin/nails:  no Difficulty focusing/concentrating: no Sweating: no Limb weakness: no Dizziness/lightheadedness: no Palpitations: no  Carbonated/caffeinated beverages: no N/V/D/C/Gas: constipation and vomiting  Abdominal pain: no Dumping syndrome: no    NUTRITION DIAGNOSIS  Overweight/obesity (-3.3) related to past poor dietary habits and physical inactivity as evidenced by completed bariatric surgery and following dietary guidelines for continued weight loss and healthy nutrition status.     NUTRITION INTERVENTION Nutrition counseling (C-1) and education (E-2) to facilitate bariatric surgery goals, including: The importance of consuming adequate calories as well as certain nutrients daily due to the body's need for essential vitamins, minerals, and fats The importance of daily physical activity and to reach a goal of at least 150 minutes of moderate to vigorous physical activity weekly (or as directed by their physician) due to benefits such as increased musculature and improved lab values The importance of intuitive eating specifically learning hunger-satiety cues and understanding the importance of learning a new body: The importance of mindful eating to avoid grazing behaviors  Make all foods fork tender: make all meats WET: use the crockpot with sauces and broths   Learning Style & Readiness for Change Teaching method utilized: Visual & Auditory  Demonstrated degree of understanding via: Teach Back  Readiness  Level: action Barriers to learning/adherence to lifestyle change: Low tolerance of foods  RD's Notes for Next Visit Assess adherence to pt chosen goals    MONITORING & EVALUATION Dietary intake, weekly physical activity, body weight  Next Steps Patient is to follow-up in 2 months

## 2022-08-17 ENCOUNTER — Other Ambulatory Visit: Payer: Self-pay | Admitting: Student

## 2022-08-17 ENCOUNTER — Ambulatory Visit (INDEPENDENT_AMBULATORY_CARE_PROVIDER_SITE_OTHER): Payer: 59 | Admitting: Vascular Surgery

## 2022-08-17 ENCOUNTER — Ambulatory Visit (HOSPITAL_COMMUNITY)
Admission: RE | Admit: 2022-08-17 | Discharge: 2022-08-17 | Disposition: A | Discharge status: Home / Self Care | Payer: 59 | Source: Ambulatory Visit | Attending: Vascular Surgery | Admitting: Vascular Surgery

## 2022-08-17 ENCOUNTER — Encounter: Payer: Self-pay | Admitting: Vascular Surgery

## 2022-08-17 VITALS — BP 143/85 | HR 48 | Temp 97.9°F | Resp 20 | Ht 64.0 in | Wt 205.0 lb

## 2022-08-17 DIAGNOSIS — R609 Edema, unspecified: Secondary | ICD-10-CM

## 2022-08-17 DIAGNOSIS — R1310 Dysphagia, unspecified: Secondary | ICD-10-CM

## 2022-08-17 DIAGNOSIS — I808 Phlebitis and thrombophlebitis of other sites: Secondary | ICD-10-CM

## 2022-08-17 NOTE — Progress Notes (Signed)
Patient ID: Janet [name], female   DOB: [birth year], 36 y.o.   MRN: [id number]  Reason for Consult: New Patient (Initial Visit)   Referred by [name], MD  Subjective:     HPI:  Janet [name] is a 36 y.o. female was admitted for laparoscopic Roux-en-Y gastric bypass in September.  She had what sounds like an infiltration event of her IV on her left hand had swelling of her left wrist.  Unfortunately the swelling resolved she was found to have clot in the vein and had persistent pain.  She has been taking aspirin and the pain has continued to improve.  She no longer has any swelling but does have tenderness to palpation.  She does have minimal skin discoloration.  She has lost over 40 pounds since surgery in 2 months.  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   on MRI   Constipation    Cushing syndrome ([place]) 2011   r/t adrenal tumor; tumor removed, disease resolved   Cushing's syndrome ([place]) 11/27/2013   Gallbladder problem    Gastroesophageal reflux disease    GERD (gastroesophageal reflux disease)    Phreesia 08/12/2020   Headache    Heart murmur    Phreesia 08/12/2020   Hematuria 09/18/2015   Hemorrhoids    History of Chiari malformation    Hypertension    Phreesia 08/12/2020   Internal hemorrhoids with other complication 62/37/6283   JAN 2015 R ANT MAR 2015 R POS AND L LAT BAND    Menstrual periods irregular 09/18/2015   Migraine without aura, with intractable migraine, so stated, without mention of status migrainosus 11/11/2013   Palpitations    Patient desires pregnancy 11/27/2013   Plantar fasciitis    Pre-diabetes    Prediabetes    Preeclampsia 05/21/2019   2x/wk testing nst alt w/ bpp/dopp      Deliver @ 37wks         IOL scheduled for 8/19 @ 0830   Pregnant 12/16/2015   Sleep apnea    SOBOE (shortness of breath on exertion)    Swelling of both lower extremities    Vitamin D deficiency    Family History  Problem Relation Age of Onset   Hypertension Mother    Hyperlipidemia Mother    Colon polyps Mother    Hypertension Father    Colon polyps Father    Sleep apnea Father    Obesity Father    Deep vein thrombosis Sister    Hypertension Maternal Grandmother    Diabetes Maternal Grandmother    Deep vein thrombosis Maternal Grandmother    Diabetes Paternal Grandmother    COPD Paternal Grandfather    Heart disease Paternal Grandfather    Other Daughter        pulmonary valve stenosis   Stroke Maternal Aunt    Migraines Maternal Aunt    Colon cancer Other        maternal great uncle   Past Surgical History:  Procedure Laterality Date   arthroscopic treatment of osteochondral defect of right ankle  03/2021   Bilateral plantar fascial [place]  03/2021   CHOLECYSTECTOMY  02/2015   COLONOSCOPY N/A 04/19/2013  LSL:HTDSKA mucosa in the terminal ileum/Single erosion in transverse colon/RECTAL BLEEDING DUE TO Moderate sized internal hemorrhoids/ trv colon erosion, bx benign.   COLONOSCOPY WITH PROPOFOL N/A 08/31/2020   Non-bleeding internal hemorrhoids. Colon torturous.    ESOPHAGOGASTRODUODENOSCOPY N/A 04/19/2013   JGO:TLXB Non-erosive gastritis/PERI-UMBILICAL PAIN DUE TO GERD, GASTRITIS, AND CONSTIPATION. Bx with mild chronic inactive gastritis. No H.Pylori   GASTRIC ROUX-EN-Y N/A 06/21/2022   Procedure: LAPAROSCOPIC ROUX-EN-Y GASTRIC BYPASS WITH UPPER ENDOSCOPY WITH HIATAL HERNIA REPAIR;  Surgeon: [name], MD;  Location: [facility];  Service: General;  Laterality: N/A;   HEMORRHOID BANDING  2015   Dr. Gala Romney- in office banding procedure   LAPAROSCOPIC ADRENALECTOMY  10/28/2010   TONSILLECTOMY     tumor removed left adrenal gland 01/12      Short Social History:  Social History   Tobacco  Use   Smoking status: Never   Smokeless tobacco: Never  Substance Use Topics   Alcohol use: No    Allergies  Allergen Reactions   Methylprednisolone Shortness Of Breath, Itching and Other (See Comments)    Reaction:  Bruising    Penicillins Anaphylaxis, Rash and Other (See Comments)    Has patient had a PCN reaction causing immediate rash, facial/tongue/throat swelling, SOB or lightheadedness with hypotension: No Has patient had a PCN reaction causing severe rash involving mucus membranes or skin necrosis: No Has patient had a PCN reaction that required hospitalization No Has patient had a PCN reaction occurring within the last 10 years: No If all of the above answers are "NO", then may proceed with Cephalosporin use.   Wound Dressing Adhesive     Burns skin   Latex Rash   Neomycin Rash    Breaks out with neosporin    Current Outpatient Medications  Medication Sig Dispense Refill   acetaZOLAMIDE (DIAMOX) 250 MG tablet Take 1/2 pill in the morning and 1 pill at bedtime. If side effects can take all in the evening. Also try to get to 2 pills a day preferably one pill twice daily but could be all at night if can't take pill in day due to side effects. 180 tablet 4   Calcium Carb-Cholecalciferol 500-10 MG-MCG TABS Take 1 tablet by mouth in the morning, at noon, and at bedtime.     Multiple Vitamin (MULTIVITAMIN) tablet Take 1 tablet by mouth daily.     ondansetron (ZOFRAN-ODT) 4 MG disintegrating tablet Allow  1 tablet (4 mg total) to dissolve by mouth every 6 (six) hours as needed for nausea or vomiting. 20 tablet 0   pantoprazole (PROTONIX) 40 MG tablet TAKE 1 TABLET BY MOUTH ONCE daily AS NEEDED FOR heartburn 30 tablet 6   Vitamin D, Ergocalciferol, (DRISDOL) 1.25 MG (50000 UNIT) CAPS capsule TAKE ONE CAPSULE BY MOUTH ONCE WEEKLY ON WEDNESDAY AND ONCE WEEKLY ON SUNDAY 8 capsule 6   No current facility-administered medications for this visit.    Review of Systems  Constitutional:   Constitutional negative.      Intentional weight loss  HENT: HENT negative.  Eyes: Eyes negative.  Respiratory: Respiratory negative.  Cardiovascular: Cardiovascular negative.  GI: Gastrointestinal negative.  Musculoskeletal:       Left wrist pain Skin:       Discoloration left wrist Neurological: Neurological negative. Hematologic: Hematologic/lymphatic negative.        Objective:  Objective   Vitals:   08/17/22 1157  BP: (!) 143/85  Pulse: (!) 48  Resp: 20  Temp: 97.9 F (36.6 C)  SpO2: 98%  Weight: 205 lb (93 kg)  Height: '5\' 4"'$  (1.626 m)   Body mass index is 35.19 kg/m.  Physical Exam Constitutional:      Appearance: Normal appearance.  HENT:     Head: Normocephalic.     Nose: Nose normal.  Eyes:     Pupils: Pupils are equal, round, and reactive to light.  Cardiovascular:     Pulses:          Radial pulses are 1+ on the left side.  Pulmonary:     Effort: Pulmonary effort is normal.  Abdominal:     General: Abdomen is flat.  Skin:    Comments: There is a very slight discoloration overlying superficial vein on the medial left wrist  Neurological:     General: No focal deficit present.     Mental Status: She is alert.  Psychiatric:        Mood and Affect: Mood normal.        Behavior: Behavior normal.        Thought Content: Thought content normal.        Judgment: Judgment normal.     Data: Right Findings:  +-----+------------+---------+-----------+----------+-------+  RIGHTCompressiblePhasicitySpontaneousPropertiesSummary  +-----+------------+---------+-----------+----------+-------+  IJV     Full       Yes       Yes                       +-----+------------+---------+-----------+----------+-------+     Left Findings:  +----------+------------+---------+-----------+----------+-------+  LEFT     CompressiblePhasicitySpontaneousPropertiesSummary  +----------+------------+---------+-----------+----------+-------+  IJV           Full       Yes       Yes                       +----------+------------+---------+-----------+----------+-------+  Subclavian   Full       Yes       Yes                       +----------+------------+---------+-----------+----------+-------+  Axillary     Full       Yes       Yes                       +----------+------------+---------+-----------+----------+-------+  Brachial     Full       Yes       Yes                       +----------+------------+---------+-----------+----------+-------+  Radial       Full       Yes       Yes                       +----------+------------+---------+-----------+----------+-------+  Ulnar        Full       Yes       Yes                       +----------+------------+---------+-----------+----------+-------+  Cephalic     Full       Yes       Yes                       +----------+------------+---------+-----------+----------+-------+  Basilic      Full       Yes  Yes                       +----------+------------+---------+-----------+----------+-------+     Summary:    Left:  No evidence of deep vein thrombosis in the upper extremity. No evidence of  superficial vein thrombosis in the upper extremity.      Assessment/Plan:    36 year old female had superficial venous thrombosis of cephalic and basilic veins after an IV placed in the hospital.  This has resolved but she still has some inflammation there.  I recommended continued aspirin since she cannot take anti-inflammatory medications due to her recent surgery and also gentle warm compresses and range of motion exercises as tolerated.  She can see me on an as-needed basis.     Waynetta Sandy MD [facility]

## 2022-08-18 ENCOUNTER — Other Ambulatory Visit (HOSPITAL_COMMUNITY): Payer: Self-pay | Admitting: Student

## 2022-08-18 ENCOUNTER — Encounter (HOSPITAL_COMMUNITY): Payer: Self-pay | Admitting: Student

## 2022-08-18 DIAGNOSIS — R1310 Dysphagia, unspecified: Secondary | ICD-10-CM

## 2022-08-18 DIAGNOSIS — K7689 Other specified diseases of liver: Secondary | ICD-10-CM

## 2022-08-19 ENCOUNTER — Ambulatory Visit
Admission: RE | Admit: 2022-08-19 | Discharge: 2022-08-19 | Disposition: A | Discharge status: Home / Self Care | Payer: 59 | Source: Ambulatory Visit | Attending: Student | Admitting: Student

## 2022-08-19 ENCOUNTER — Ambulatory Visit (HOSPITAL_COMMUNITY): Admission: RE | Admit: 2022-08-19 | Payer: 59 | Source: Ambulatory Visit

## 2022-08-19 DIAGNOSIS — R1310 Dysphagia, unspecified: Secondary | ICD-10-CM

## 2022-08-23 ENCOUNTER — Ambulatory Visit (HOSPITAL_COMMUNITY)
Admission: RE | Admit: 2022-08-23 | Discharge: 2022-08-23 | Disposition: A | Discharge status: Home / Self Care | Payer: 59 | Source: Ambulatory Visit | Attending: Student | Admitting: Student

## 2022-08-23 DIAGNOSIS — K7689 Other specified diseases of liver: Secondary | ICD-10-CM | POA: Insufficient documentation

## 2022-08-23 MED ORDER — IOHEXOL 300 MG/ML  SOLN
100.0000 mL | Freq: Once | INTRAMUSCULAR | Status: AC | PRN
  Administered 2022-08-23: 100 mL via INTRAVENOUS

## 2022-08-25 ENCOUNTER — Telehealth: Payer: Self-pay | Admitting: Family Medicine

## 2022-08-25 NOTE — Telephone Encounter (Signed)
Patient called in requesting  call back to review CT results. Patient wants recommendation on what to do.  If needs referral patient wants referral placed to [facility].

## 2022-08-26 NOTE — Telephone Encounter (Signed)
Spoke with pt she states that she is scheduled for a follow up with them Novermber 29th and scheduled to see a liver surgeon. Pt is asking to schedule a follow up with Dr Moshe Cipro, because she ca better explain what [facility] is saying. Please advise.

## 2022-08-26 NOTE — Telephone Encounter (Signed)
Spoke with pt advised of Dr Griffin Dakin message pt verbalized understanding

## 2022-08-30 ENCOUNTER — Other Ambulatory Visit (HOSPITAL_COMMUNITY): Payer: Self-pay | Admitting: Surgery

## 2022-08-30 DIAGNOSIS — R160 Hepatomegaly, not elsewhere classified: Secondary | ICD-10-CM

## 2022-08-30 DIAGNOSIS — K7689 Other specified diseases of liver: Secondary | ICD-10-CM

## 2022-08-31 ENCOUNTER — Ambulatory Visit (HOSPITAL_COMMUNITY)
Admission: RE | Admit: 2022-08-31 | Discharge: 2022-08-31 | Disposition: A | Discharge status: Home / Self Care | Payer: 59 | Source: Ambulatory Visit | Attending: Surgery | Admitting: Surgery

## 2022-08-31 DIAGNOSIS — K7689 Other specified diseases of liver: Secondary | ICD-10-CM | POA: Diagnosis present

## 2022-08-31 DIAGNOSIS — R160 Hepatomegaly, not elsewhere classified: Secondary | ICD-10-CM | POA: Insufficient documentation

## 2022-08-31 MED ORDER — GADOXETATE DISODIUM 0.25 MMOL/ML IV SOLN
10.0000 mL | Freq: Once | INTRAVENOUS | Status: AC | PRN
  Administered 2022-08-31: 10 mL via INTRAVENOUS

## 2022-09-08 ENCOUNTER — Telehealth: Payer: Self-pay | Admitting: Skilled Nursing Facility1

## 2022-09-08 NOTE — Telephone Encounter (Signed)
Called pt to check in on her status.   Pt states she is still throwing up all solids and some liquids as well. Pt states she had bad acid reflux prior to surgery and states this is what it feels like but the Protonix does not seem to be helping.   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   Pt states she is miserable and is trying so hard.   Dietitian advised we believe she is doing everything she can sometimes this just happens to people.    Dietitian offered more recipe ideas and how to get more protein in via liquids. Dietitian advised pt to log and track and count everything she puts into her mouth and whether she threw up to get a better idea of how many grams of protein she is actually getting in.   Pt mentioned her surgeon mentioned TPN but is trying to avoid that: Dietitian advised it is something that some people need in order to meet their needs and feel well.   Dietitian advised pt to stay in touch with her surgeon to ensure she does not unnecessarily go on vomiting all of her nutrients up especially during the holiday season.

## 2022-09-14 ENCOUNTER — Other Ambulatory Visit: Payer: Self-pay

## 2022-09-14 NOTE — Progress Notes (Signed)
The proposed treatment discussed in conference is for discussion purpose only and is not a binding recommendation.  The patients have not been physically examined, or presented with their treatment options.  Therefore, final treatment plans cannot be decided.  

## 2022-10-01 ENCOUNTER — Telehealth: Payer: Self-pay | Admitting: Surgery

## 2022-10-01 NOTE — Telephone Encounter (Signed)
Patient called requesting IV fluid therapy to be arranged, states she did not drink much after seeing Dr Redmond Pulling yesterday- see notes. States water doesn't taste good going down and she does not like any of the sugar free electolyte drinks. Reports fatigue, dry mouth. Discussed w patient that we don't have any outpatient IV clinics that are available on the weekend/holiday. Encouraged her to push fluids (discussed regular electrolyte drinks, G2, broth, vitamin water, etc). ER is the only option unfortunately until Tuesday, but patient advised to report there if dehydration not improving.

## 2022-10-05 ENCOUNTER — Encounter: Payer: Self-pay | Admitting: Family Medicine

## 2022-10-05 ENCOUNTER — Ambulatory Visit (INDEPENDENT_AMBULATORY_CARE_PROVIDER_SITE_OTHER): Payer: 59 | Admitting: Family Medicine

## 2022-10-05 VITALS — BP 150/96 | HR 79 | Ht 64.0 in | Wt 190.1 lb

## 2022-10-05 DIAGNOSIS — L918 Other hypertrophic disorders of the skin: Secondary | ICD-10-CM | POA: Diagnosis not present

## 2022-10-05 DIAGNOSIS — B349 Viral infection, unspecified: Secondary | ICD-10-CM

## 2022-10-05 DIAGNOSIS — J329 Chronic sinusitis, unspecified: Secondary | ICD-10-CM

## 2022-10-05 DIAGNOSIS — I10 Essential (primary) hypertension: Secondary | ICD-10-CM | POA: Diagnosis not present

## 2022-10-05 DIAGNOSIS — L659 Nonscarring hair loss, unspecified: Secondary | ICD-10-CM

## 2022-10-05 LAB — POCT INFLUENZA A/B
Influenza A, POC: NEGATIVE
Influenza B, POC: NEGATIVE

## 2022-10-05 MED ORDER — LORATADINE 10 MG PO TABS: 10.0000 mg | ORAL_TABLET | Freq: Every day | ORAL | 3 refills | Status: DC

## 2022-10-05 MED ORDER — MINOXIDIL 10 MG PO TABS: 10.0000 mg | ORAL_TABLET | Freq: Every day | ORAL | 2 refills | Status: DC

## 2022-10-05 NOTE — Patient Instructions (Signed)
F/u in 8 weeks, re eval bP and hair loss , call if you need me sooner  PLEASE stop flat iron, damages hair  Increase protein  New is mioxidil 10 mg one daily for hair loss and also will lower BP  You are referred to surgery re skin tag  (in [place])  Need to take diamox regularly bP is high  Commit to daily claritin for allergy symptoms  Labs from Dr Redmond Pulling at [facility] today  Flu and covid testing today please  Thanks for choosing [facility], we consider it a privelige to serve you.

## 2022-10-07 ENCOUNTER — Encounter: Payer: Self-pay | Admitting: Family Medicine

## 2022-10-07 LAB — NOVEL CORONAVIRUS, NAA: SARS-CoV-2, NAA: DETECTED — AB

## 2022-10-07 NOTE — Progress Notes (Unsigned)
   Janet [name]     MRN: [id number]      DOB: 01-05-1986   HPI Janet [name] is here for follow up and re-evaluation of chronic medical conditions, medication management and review of any available recent lab and radiology data.  Preventive health is updated, specifically  Cancer screening and Immunization.   s   ROS Denies recent fever or chills. Denies sinus pressure, nasal congestion, ear pain or sore throat. Denies chest congestion, productive cough or wheezing. Denies chest pains, palpitations and leg swelling Denies abdominal pain, nausea, vomiting,diarrhea or constipation.   Denies dysuria, frequency, hesitancy or incontinence. Denies joint pain, swelling and limitation in mobility. Denies headaches, seizures, numbness, or tingling. Denies depression, anxiety or insomnia. Denies skin break down or rash.   PE  BP (!) 150/96   Pulse 79   Ht '5\' 4"'$  (1.626 m)   Wt 190 lb 1.9 oz (86.2 kg)   SpO2 96%   BMI 32.63 kg/m   Patient alert and oriented and in no cardiopulmonary distress.  HEENT: No facial asymmetry, EOMI,     Neck supple .  Chest: Clear to auscultation bilaterally.  CVS: S1, S2 no murmurs, no S3.Regular rate.  ABD: Soft non tender.   Ext: No edema  MS: Adequate ROM spine, shoulders, hips and knees.  Skin: Intact, no ulcerations or rash noted.  Psych: Good eye contact, normal affect. Memory intact not anxious or depressed appearing.  CNS: CN 2-12 intact, power,  normal throughout.no focal deficits noted.   Assessment & Plan  ***

## 2022-10-09 DIAGNOSIS — L918 Other hypertrophic disorders of the skin: Secondary | ICD-10-CM | POA: Insufficient documentation

## 2022-10-09 DIAGNOSIS — L659 Nonscarring hair loss, unspecified: Secondary | ICD-10-CM | POA: Insufficient documentation

## 2022-10-09 NOTE — Assessment & Plan Note (Signed)
  Patient re-educated about  the importance of commitment to a  minimum of 150 minutes of exercise per week as able.  The importance of healthy food choices with portion control discussed, as well as eating regularly and within a 12 hour window most days. The need to choose "clean , green" food 50 to 75% of the time is discussed, as well as to make water the primary drink and set a goal of 64 ounces water daily.       10/05/2022    3:01 PM 08/17/2022   11:57 AM 08/16/2022   11:52 AM  Weight /BMI  Weight 190 lb 1.9 oz 205 lb 203 lb 12.8 oz  Height '5\' 4"'$  (1.626 m) '5\' 4"'$  (1.626 m) '5\' 4"'$  (1.626 m)  BMI 32.63 kg/m2 35.19 kg/m2 34.98 kg/m2    48 pound weight loss in 14 weeks following bypass , stil having difficulty with adequate protein intake ,and working on this

## 2022-10-09 NOTE — Assessment & Plan Note (Signed)
Uncontrolled , has stopped diampox over past 4 days, needs to resume DASH diet and commitment to daily physical activity for a minimum of 30 minutes discussed and encouraged, as a part of hypertension management. The importance of attaining a healthy weight is also discussed.     10/05/2022    4:02 PM 10/05/2022    3:57 PM 10/05/2022    3:03 PM 10/05/2022    3:01 PM 08/17/2022   11:57 AM 08/16/2022   11:52 AM 08/12/2022    1:31 PM  BP/Weight  Systolic BP 171 278 718 367 255  001  Diastolic BP 96 98 82 96 85  80  Wt. (Lbs)    190.12 205 203.8   BMI    32.63 kg/m2 35.19 kg/m2 34.98 kg/m2

## 2022-10-09 NOTE — Assessment & Plan Note (Signed)
Enlargingin and irritating skin tag on right abdominal wall, refer gen surg

## 2022-10-09 NOTE — Assessment & Plan Note (Signed)
Ongoing problem, increasing protein intake, tril of minoxidil 10 mg daily and re eval in 8 weeks

## 2022-10-09 NOTE — Assessment & Plan Note (Signed)
Acute onset of excessive fatigue, with respiratory symptoms, test for covid and flu Hydration , rest and adequate nutrition encouraged

## 2022-10-12 ENCOUNTER — Ambulatory Visit: Payer: Self-pay | Admitting: Surgery

## 2022-10-12 MED ORDER — SODIUM CHLORIDE 0.9 % IV SOLN: 4.0000 mg | INTRAVENOUS | Status: AC | PRN

## 2022-10-12 MED ORDER — THIAMINE HCL 100 MG/ML IJ SOLN: Freq: Once | INTRAVENOUS | Status: AC

## 2022-10-12 MED ORDER — SODIUM CHLORIDE 0.9 % IV BOLUS: 1000.0000 mL | Freq: Once | INTRAVENOUS | Status: AC

## 2022-10-12 MED ORDER — ONDANSETRON 4 MG PO TBDP: 4.0000 mg | ORAL_TABLET | ORAL | Status: AC | PRN

## 2022-10-13 ENCOUNTER — Other Ambulatory Visit: Payer: Self-pay | Admitting: General Surgery

## 2022-10-13 ENCOUNTER — Ambulatory Visit (INDEPENDENT_AMBULATORY_CARE_PROVIDER_SITE_OTHER): Payer: 59

## 2022-10-13 ENCOUNTER — Other Ambulatory Visit (INDEPENDENT_AMBULATORY_CARE_PROVIDER_SITE_OTHER): Payer: 59

## 2022-10-13 VITALS — BP 139/81 | HR 72 | Temp 97.9°F | Resp 18 | Ht 64.0 in | Wt 195.2 lb

## 2022-10-13 DIAGNOSIS — E860 Dehydration: Secondary | ICD-10-CM

## 2022-10-13 LAB — COMPREHENSIVE METABOLIC PANEL
ALT: 11 U/L (ref 0–35)
AST: 16 U/L (ref 0–37)
Albumin: 3.8 g/dL (ref 3.5–5.2)
Alkaline Phosphatase: 64 U/L (ref 39–117)
BUN: 11 mg/dL (ref 6–23)
CO2: 29 mEq/L (ref 19–32)
Calcium: 8.7 mg/dL (ref 8.4–10.5)
Chloride: 105 mEq/L (ref 96–112)
Creatinine, Ser: 0.67 mg/dL (ref 0.40–1.20)
GFR: 112.01 mL/min (ref >60.00)
Glucose, Bld: 92 mg/dL (ref 70–99)
Potassium: 3.3 mEq/L — ABNORMAL LOW (ref 3.5–5.1)
Sodium: 140 mEq/L (ref 135–145)
Total Bilirubin: 0.2 mg/dL (ref 0.2–1.2)
Total Protein: 6.6 g/dL (ref 6.0–8.3)

## 2022-10-13 MED ORDER — SODIUM CHLORIDE 0.9 % IV BOLUS
1000.0000 mL | Freq: Once | INTRAVENOUS | Status: AC
  Administered 2022-10-13: 1000 mL via INTRAVENOUS
  Filled 2022-10-13: qty 1000

## 2022-10-13 MED ORDER — THIAMINE HCL 100 MG/ML IJ SOLN
Freq: Once | INTRAVENOUS | Status: AC
  Filled 2022-10-13: qty 1000

## 2022-10-13 NOTE — Progress Notes (Signed)
Diagnosis: Dehydration  Provider:  Marshell Garfinkel MD  Procedure: Infusion  IV Type: Peripheral, IV Location: R Antecubital  Banana Bag, Dose: 1000 ml  Infusion Start Time: 6203  Infusion Stop Time: 1523   Normal Saline, Dose: 1000 ml  Infusion Start Time: 5597  Infusion Stop Time: 1620  Post Infusion IV Care: Peripheral IV Discontinued  Discharge: Condition: Good, Destination: Home . AVS provided to patient.   Performed by:  Cleophus Molt, RN

## 2022-10-18 ENCOUNTER — Telehealth (INDEPENDENT_AMBULATORY_CARE_PROVIDER_SITE_OTHER): Payer: 59 | Admitting: Neurology

## 2022-10-18 ENCOUNTER — Ambulatory Visit: Payer: 59 | Admitting: Surgery

## 2022-10-18 ENCOUNTER — Encounter: Payer: Self-pay | Admitting: Surgery

## 2022-10-18 VITALS — BP 112/77 | HR 56 | Temp 98.2°F | Resp 14 | Ht 64.0 in | Wt 195.0 lb

## 2022-10-18 DIAGNOSIS — Q248 Other specified congenital malformations of heart: Secondary | ICD-10-CM

## 2022-10-18 DIAGNOSIS — H4710 Unspecified papilledema: Secondary | ICD-10-CM | POA: Diagnosis not present

## 2022-10-18 DIAGNOSIS — L918 Other hypertrophic disorders of the skin: Secondary | ICD-10-CM

## 2022-10-18 DIAGNOSIS — G935 Compression of brain: Secondary | ICD-10-CM | POA: Diagnosis not present

## 2022-10-18 DIAGNOSIS — Q245 Malformation of coronary vessels: Secondary | ICD-10-CM

## 2022-10-18 NOTE — Patient Instructions (Addendum)
Continue to lose weight See Dr. Katy Fitch Conitinue with acetazolamide that you can tolerate Will send to Dr. Harvel Ricks for another opinion from   Dr. Harvel Ricks  Areas of focus Brain Tumors Chiari I Malformation Hydrocephalus Idiopathic Intracranial Hypertension Neuroendovascular Surgery Skull Base Surgery [facility] [place] [phone])

## 2022-10-18 NOTE — Progress Notes (Signed)
Reason for visit: Headache, Arnold-Chiari malformation  Virtual Visit via Video Note  I connected with Janet [name] on 10/22/22 at  1:00 PM EST by a video enabled telemedicine application and verified that I am speaking with the correct person using two identifiers.  Location: Patient: home Provider: office   I discussed the limitations of evaluation and management by telemedicine and the availability of in person appointments. The patient expressed understanding and agreed to proceed.   Follow Up Instructions:    I discussed the assessment and treatment plan with the patient. The patient was provided an opportunity to ask questions and all were answered. The patient agreed with the plan and demonstrated an understanding of the instructions.   The patient was advised to call back or seek an in-person evaluation if the symptoms worsen or if the condition fails to improve as anticipated.  I provided over 45 minutes of non-face-to-face time during this encounter.   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  Complicated patient, unclear if she has IIH given her weight and her age, if her headaches and papilledema are as a result of her Chiari malformation or her IIH, she also may have focal stenosis in the lateral aspect of the left transverse sinus.  However her headaches have improved since losing weight and being on Diamox but she cannot tolerate Diamox.  Last ophthalmic exam was neutral, appears to have papilledema but headaches are improved. She has had bariatric surgery. She has lost 50 pounds. She is back on diamox but making her feel bad the numbness and tingling and being tired.  In the past we have discussed decompression surgery, stenting, very difficult to have a lumbar puncture due to her Chiari  malformation.  She is not tolerating Diamox.  I will send her for second opinion to Dr. Harvel Ricks.   personally reviewed images and agree and reviewed images with patient as well: (additional 15 minutes reviewed images prior to appointment)  MRI 03/12/2022:IMPRESSION: Unremarkable MRI scan of the brain with and without contrast showing type I Arnold-Chiari malformation with low-lying cerebellar tonsils as well as subtle enlargement of the optic nerve sheaths and partially empty sella which are incidental findings.  No significant change compared with previous MRI dated 05/01/2021.  CTV 03/12/2022: IMPRESSION: 1. 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 2. No thrombus is present. 3. The superior sagittal sinus, straight sinus, right transverse sinus and deep cerebral veins are patent.  Patient complains of symptoms per HPI as well as the following symptoms: headaches, chiari . Pertinent negatives and positives per HPI. All others negative:    07/25/2022: She is seeing Dr. Katy Fitch in December for a follow up. She has lost 30 pounds and the headaches are so much better. She had bariatric surgery, her vision is fine no loss of vision, hedaches are much better, she continue to los weight, tolerating the diamox but she was not able to make it to '500mg'$  all she could do it 1.5 pills that's all she can tolerate we can keep here since doing well and losing weight.Initially felt like her head was going to explode now she is doing great. We spoke about decompression but if losing weight and doing well on the diamox, and if she lose enough  weight we may be able to take her off of the diamox as we monitor, Seeing Dr. Katy Fitch in December see [place] in January since doing well. Will do it in the office.  Patient complains of symptoms per HPI as well as the following symptoms: numbness and tingling in the toes . Pertinent negatives and positives per HPI. All others negative   Janet [name] is an 37 y.o. female [facility]  Since I last saw her she has seen neurosurgery, seen dr Zada Finders and he recommended no shunt, recommended seeing Dr. Katy Fitch. Saw Dr. Katy Fitch and he said weight loss and diamox. Luckily doing great on the diamox, and feeling much better, stable vision according to dr Katy Fitch, no worsening vision. Her symptoms as far as vision better, pressure headaches better but still some occipital sensitization sometimes. She is on diamox once daily '250mg'$  try 1.5 at bedtine then may increase to '500mg'$ .   Comorbidities to obesity include IDIOPATHIC INTRACRANIAL HYPERTENSION, High Cholesterol, HTN, sleep apnea.Hgba1c 8, tried   In the past he has used Trulicity, victoza, phentermine, glipizide, glyburide,metformin, Glimepiride, invokana, jardiance, januvia, Pioglitazone, victoza,, Ozempic and Saxenda with nausea and he has also tried metformin with diarrhea.,rybelsus, orlistat (Xenical, Alli), phentermine-topiramate (Qsymia), naltrexone-bupropion (Contrave), and semaglutide [facility]), gliflozins, actos, also has been involved in a formal weight loss program for > 1 year (weight watchers, bariatric clinic, healthy weight and wellness center, [facility], [facility] healthy weight loss, noom with great compliance)  MRI 03/12/2022:IMPRESSION: Unremarkable MRI scan of the brain with and without contrast showing type I Arnold-Chiari malformation with low-lying cerebellar tonsils as well as subtle enlargement of the optic nerve sheaths and partially empty sella which are incidental findings.  No significant change compared with previous MRI dated 05/01/2021.  CTV 03/12/2022: IMPRESSION: 1. 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 2. No thrombus is present. 3. The superior sagittal sinus, straight sinus, right transverse sinus and deep cerebral veins are patent.   Patient complains of symptoms per HPI as well as the following symptoms: obesity . Pertinent  negatives and positives per HPI. All others negative   02/28/2022: Patient is here today, transitioning to my care from Dr. Jannifer Franklin,  and has an 82mchiari malformation that appears peg like. She saw optometry who believes she may have OKeoedema.  She has not seen an ophthalmology. Headaches feel like behind most eye, pressure in the back of the head, hurts when she sneeezes, neck pain, pressure, headaches hurt with bearing down. She has has also had migraines since childhood, but these are different. With her migraines, she has light and sound sensitivity, frontal area but now with more pressure. Tried topiramate in the past. Has tried Topiramate in 2022 with Dr. WJannifer Franklinand in 2014/2015 with Dr. SMoshe CiproNo episodes of blurry vision. Daily headaches, ongoing for years, now more pressure than with migrainous features and getting more intense.   MRI brain 05/03/2021: personally reviewed images and agree and reviewed images with patient as well: IMPRESSION: This MRI of the brain without contrast shows the following: 1.   Chiari malformation with 11 mm of cerebellar ectopia associated with some stenosis at the foramen magnum but no abnormal signal within the brainstem, spinal cord or cerebellum.  Compared to the MRI from 2017, there has been slight progression of an additional 1 mm. 2.   The brain is otherwise normal.    Patient complains of symptoms per HPI as well as the following symptoms: worsening  headaches, chiari malformation . Pertinent negatives and positives per HPI. All others negative   History of present illness: Dr. Jannifer Franklin  Janet [name] is a 37 year old right-handed black female with a history of headaches.  She is having some form of headache on a daily basis.  She in the past has a history of Arnold-Chiari malformation, she has reported pressure sensations coming of the back of the head with coughing or sneezing or straining, she may see black spots in front of the eyes at times.  She also has  what sounds like 2 migraine headaches with photophobia and phonophobia and some nausea.  These may occur 2 or 3 times a month, but she is having some form of headache on a daily basis.  Occasionally when she turns her head, she may get a headache.  Again this is brief in nature.  The patient recently had right ankle surgery, she is recovering from this, not fully weightbearing yet.  She comes back here for an evaluation.  She was placed on Topamax previously but could not tolerate the drug and went off the medication.  She may take Fioricet if needed for the headache.  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   r/t adrenal tumor; tumor removed, disease resolved   Cushing's syndrome ([place]) 11/27/2013   Gallbladder problem    Gastroesophageal reflux disease    GERD (gastroesophageal reflux disease)    Phreesia 08/12/2020   Headache    Heart murmur    Phreesia 08/12/2020   Hematuria 09/18/2015   Hemorrhoids    History of Chiari malformation    Hypertension    Phreesia 08/12/2020   Internal hemorrhoids with other complication 25/95/6387   JAN 2015 R ANT MAR 2015 R POS AND L LAT BAND    Menstrual periods irregular 09/18/2015   Migraine without aura, with intractable migraine, so stated, without mention of status migrainosus 11/11/2013   Palpitations    Patient desires pregnancy 11/27/2013   Plantar fasciitis    Pre-diabetes    Prediabetes    Preeclampsia 05/21/2019   2x/wk testing nst alt w/ bpp/dopp      Deliver @ 37wks        IOL scheduled for 8/19 @ 0830   Pregnant 12/16/2015   Sleep apnea    SOBOE (shortness of breath on exertion)    Swelling of both lower extremities    Vitamin D deficiency      Past Surgical History:  Procedure Laterality Date   arthroscopic treatment of osteochondral defect of right ankle  03/2021   Bilateral plantar fascial [place]  03/2021   CHOLECYSTECTOMY  02/2015   COLONOSCOPY N/A 04/19/2013   FIE:PPIRJJ mucosa in the terminal ileum/Single erosion in transverse colon/RECTAL BLEEDING DUE TO Moderate sized internal hemorrhoids/ trv colon erosion, bx benign.   COLONOSCOPY WITH PROPOFOL N/A 08/31/2020   Non-bleeding internal hemorrhoids. Colon torturous.    ESOPHAGOGASTRODUODENOSCOPY N/A 04/19/2013   OAC:ZYSA Non-erosive gastritis/PERI-UMBILICAL PAIN DUE TO GERD, GASTRITIS, AND CONSTIPATION. Bx with mild chronic inactive gastritis. No H.Pylori   GASTRIC ROUX-EN-Y N/A 06/21/2022   Procedure: LAPAROSCOPIC ROUX-EN-Y GASTRIC BYPASS  WITH UPPER ENDOSCOPY WITH HIATAL HERNIA REPAIR;  Surgeon: [name], MD;  Location: [facility];  Service: General;  Laterality: N/A;   HEMORRHOID BANDING  2015   Dr. Gala Romney- in office banding procedure   LAPAROSCOPIC ADRENALECTOMY  10/28/2010   TONSILLECTOMY     tumor removed left adrenal gland 01/12      Family History  Problem Relation Age of Onset   Hypertension Mother    Hyperlipidemia Mother    Colon polyps Mother    Hypertension Father    Colon polyps Father    Sleep apnea Father    Obesity Father    Deep vein thrombosis Sister    Hypertension Maternal Grandmother    Diabetes Maternal Grandmother    Deep vein thrombosis Maternal Grandmother    Diabetes Paternal Grandmother    COPD Paternal Grandfather    Heart disease Paternal Grandfather    Other Daughter        pulmonary valve stenosis   Stroke Maternal Aunt    Migraines Maternal Aunt    Colon cancer Other        maternal great uncle    Social history:  reports that she has never smoked. She has never used smokeless tobacco. She reports that she does not drink alcohol and does not use drugs.    Allergies  Allergen Reactions   Methylprednisolone Shortness Of  Breath, Itching and Other (See Comments)    Reaction:  Bruising    Penicillins Anaphylaxis, Rash and Other (See Comments)    Has patient had a PCN reaction causing immediate rash, facial/tongue/throat swelling, SOB or lightheadedness with hypotension: No Has patient had a PCN reaction causing severe rash involving mucus membranes or skin necrosis: No Has patient had a PCN reaction that required hospitalization No Has patient had a PCN reaction occurring within the last 10 years: No If all of the above answers are "NO", then may proceed with Cephalosporin use.   Wound Dressing Adhesive     Burns skin   Latex Rash   Neomycin Rash    Breaks out with neosporin    Medications:  Prior to Admission medications   Medication Sig Start Date End Date Taking? Authorizing Provider  ergocalciferol (VITAMIN D2) 1.25 MG (50000 UT) capsule 1 po q wed, and 1 po q sun 01/05/21  Yes Opalski, Deborah, DO  Multiple Vitamin (MULTIVITAMIN) tablet Take 1 tablet by mouth daily.   Yes [provider]  pantoprazole (PROTONIX) 40 MG tablet Take one tablet by mouth once daily, as needed, for heartburn Patient taking differently: Take 40 mg by mouth daily. 06/30/20  Yes [name], MD  butalbital-acetaminophen-caffeine (FIORICET) [phone] MG tablet Take one tablet up to two times daily , as needed, for severe headache Patient not taking: Reported on 04/26/2021 08/13/20   [name], MD    There were no vitals taken for this visit.  Physical exam: Exam: Gen: NAD, conversant      CV: attempted, Could not perform over Web Video. Denies palpitations or chest pain or SOB. VS: Breathing at a normal rate. Not febrile. Eyes: Conjunctivae clear without exudates or hemorrhage  Neuro: Detailed Neurologic Exam  Speech:    Speech is normal; fluent and spontaneous with normal comprehension.  Cognition:    The patient is oriented to person, place, and time;     recent and remote memory intact;      language fluent;     normal attention, concentration,     fund of  knowledge Cranial Nerves:    The pupils are equal, round, and reactive to light. Cannot perform fundoscopic exam. Visual fields are full to finger confrontation. Extraocular movements are intact.  The face is symmetric with normal sensation. The palate elevates in the midline. Hearing intact. Voice is normal. Shoulder shrug is normal. The tongue has normal motion without fasciculations.   Coordination:    Normal finger to nose  Gait:    Normal native gait  Motor Observation:   no involuntary movements noted. Tone:    Appears normal  Posture:    Posture is normal. normal erect    Strength:    Strength is anti-gravity and symmetric in the upper and lower limbs.      Sensation: intact to LT   Assessment/Plan: Morbidly obese patient is here today for follow up, transitioned to my care from Dr. Jannifer Franklin, recent weight gain, hx of chiari malformation, last MRI showed 53mchiari malformation that appears peg like. She saw ophthalmology and neurosurgery since last visit, there is some ONH edema stable. Headaches pressure in the back of the head, hurts when she sneeezes, neck pain, pressure, headaches hurt with bearing down. She has has also had migraines since childhood, but these worsening headaches are different.  Daily headaches, ongoing for years, now more pressure than with migrainous features and getting more intense.  Unclear if symptoms due to IDIOPATHIC INTRACRANIAL HYPERTENSION or chiari or focal stenosis (see CTV). We will get her to lose weight, cont diamox and follow.  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  Complicated patient, unclear if she has IIH given her weight and her age, if her headaches and papilledema are as a result of her Chiari malformation or  her IIH, she also may have focal stenosis in the lateral aspect of the left transverse sinus.  Lumbar puncture is too risky giving such a large Chiari malformation so we have not been able to perform a lumbar puncture on her to help with the differential.  However her headaches have improved since losing weight and being on Diamox but she cannot tolerate Diamox.  Last ophthalmic exam was neutral, appears to have papilledema but headaches are improved. She has had bariatric surgery. She has lost 50 pounds. She is back on diamox but making her feel bad the numbness and tingling and being tired.  In the past we have discussed decompression surgery, stenting, very difficult to have a lumbar puncture due to her Chiari malformation.  She is not tolerating Diamox.  I will send her for second opinion to Dr. JHarvel Ricks Will also start her on lasix.    Prior Assessment and plans:  - Obesity is risk factor: had surgery, discussed moujaro and ozempic f/u with bariatric for further management of weight(Comorbidities to obesity include IDIOPATHIC INTRACRANIAL HYPERTENSION, High Cholesterol, HTN, sleep apnea.Hgba1c 8, tried   In the past he has used Trulicity, victoza, phentermine, glipizide, glyburide,metformin, Glimepiride, invokana, jardiance, januvia, Pioglitazone, victoza,, Ozempic and Saxenda with nausea and he has also tried metformin with diarrhea.,rybelsus, orlistat (Xenical, Alli), phentermine-topiramate (Qsymia), naltrexone-bupropion (Contrave), and semaglutide ([facility], gliflozins, actos, also has been involved in formal weight loss programs for > 1 year (weight watchers, bariatric clinic, healthy weight and wellness center, [facility], [facility] healthy weight loss, noom with great compliance)) recommend bariatric surgery, wegovy or zepbound - Hold off on  Lumbar puncture for opening pressure, too risky given chiari  personally reviewed images and agree and reviewed images with patient as well: (additional 15  minutes reviewed images prior to appointment)  MRI 03/12/2022:IMPRESSION: Unremarkable MRI scan of the brain with and without contrast showing type I Arnold-Chiari malformation with low-lying cerebellar tonsils as well as subtle enlargement of the optic nerve sheaths and partially empty sella which are incidental findings.  No significant change compared with previous MRI dated 05/01/2021.  CTV 03/12/2022: IMPRESSION: 1. 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 2. No thrombus is present. 3. The superior sagittal sinus, straight sinus, right transverse sinus and deep cerebral veins are patent.  Sarina Ill MD  Meds ordered this encounter  Medications   furosemide (LASIX) 20 MG tablet    Sig: Take 1 tablet (20 mg total) by mouth daily.    Dispense:  30 tablet    Refill:  2   Cc: Dr. Moshe Cipro, [facility] [address] [place], [place] [address]  Phone [phone] Fax [phone]  I spent over 80 minutes of face-to-face and non-face-to-face time with patient on the  1. Chiari malformation type I ([place])   2. Morbidly obese ([place])   3. Papilledema   4. Coronary sinus stenosis       diagnosis.  This included previsit chart review, lab review, study review, order entry, electronic health record documentation, patient education on the different diagnostic and therapeutic options, counseling and coordination of care, risks and benefits of management, compliance, or risk factor reduction

## 2022-10-18 NOTE — Progress Notes (Signed)
[facility] History and Physical  Reason for Referral: Right abdominal skin tag Referring Physician: Dr. Moshe Cipro  Chief Complaint   New Patient (Initial Visit)     Janet [name] is a 37 y.o. female.  HPI: Patient presents for evaluation of a right abdominal skin tag.  It has been present for about a year and has been increasing in size.  She denies any pain, itching, or inflammation of the area.  She has no other complaints at this time.  Her past medical history significant for migraines, Cushing syndrome status post left adrenalectomy, Chiari malformation, intracranial hypertension, and arrhythmia.  Her surgical history significant for cholecystectomy, Roux-en-Y bypass with hiatal hernia repair and adrenalectomy.  She denies use of blood thinning medications.  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   on MRI   Constipation    Cushing syndrome ([place]) 2011   r/t adrenal tumor; tumor removed, disease resolved   Cushing's syndrome ([place]) 11/27/2013   Gallbladder problem    Gastroesophageal reflux disease    GERD (gastroesophageal reflux disease)    Phreesia 08/12/2020   Headache    Heart murmur    Phreesia 08/12/2020   Hematuria 09/18/2015   Hemorrhoids    History of Chiari malformation    Hypertension    Phreesia 08/12/2020   Internal hemorrhoids with other complication 41/28/7867   JAN 2015 R ANT MAR 2015 R POS AND L LAT BAND    Menstrual periods irregular 09/18/2015   Migraine without aura, with intractable migraine, so stated, without mention of status migrainosus 11/11/2013   Palpitations    Patient desires pregnancy 11/27/2013   Plantar fasciitis    Pre-diabetes    Prediabetes    Preeclampsia 05/21/2019   2x/wk  testing nst alt w/ bpp/dopp      Deliver @ 37wks        IOL scheduled for 8/19 @ 0830   Pregnant 12/16/2015   Sleep apnea    SOBOE (shortness of breath on exertion)    Swelling of both lower extremities    Vitamin D deficiency     Past Surgical History:  Procedure Laterality Date   arthroscopic treatment of osteochondral defect of right ankle  03/2021   Bilateral plantar fascial [place]  03/2021   CHOLECYSTECTOMY  02/2015   COLONOSCOPY N/A 04/19/2013   EHM:CNOBSJ mucosa in the terminal ileum/Single erosion in transverse colon/RECTAL BLEEDING DUE TO Moderate sized internal hemorrhoids/ trv colon erosion, bx benign.   COLONOSCOPY WITH PROPOFOL N/A 08/31/2020   Non-bleeding internal hemorrhoids. Colon torturous.    ESOPHAGOGASTRODUODENOSCOPY N/A 04/19/2013   GGE:ZMOQ Non-erosive gastritis/PERI-UMBILICAL PAIN DUE TO GERD, GASTRITIS, AND CONSTIPATION. Bx with mild chronic inactive gastritis. No H.Pylori   GASTRIC ROUX-EN-Y N/A 06/21/2022   Procedure: LAPAROSCOPIC ROUX-EN-Y GASTRIC BYPASS WITH UPPER ENDOSCOPY WITH HIATAL HERNIA REPAIR;  Surgeon: [name], MD;  Location: [facility];  Service: General;  Laterality: N/A;   HEMORRHOID BANDING  2015   Dr. Gala Romney- in office banding procedure   LAPAROSCOPIC ADRENALECTOMY  10/28/2010   TONSILLECTOMY     tumor removed left adrenal gland 01/12      Family History  Problem Relation Age of Onset   Hypertension  Mother    Hyperlipidemia Mother    Colon polyps Mother    Hypertension Father    Colon polyps Father    Sleep apnea Father    Obesity Father    Deep vein thrombosis Sister    Hypertension Maternal Grandmother    Diabetes Maternal Grandmother    Deep vein thrombosis Maternal Grandmother    Diabetes Paternal Grandmother    COPD Paternal Grandfather    Heart disease Paternal Grandfather    Other Daughter        pulmonary valve stenosis   Stroke Maternal Aunt    Migraines Maternal Aunt    Colon cancer Other        maternal great uncle     Social History   Tobacco Use   Smoking status: Never   Smokeless tobacco: Never  Vaping Use   Vaping Use: Never used  Substance Use Topics   Alcohol use: No   Drug use: No    Medications: I have reviewed the patient's current medications. Allergies as of 10/18/2022       Reactions   Methylprednisolone Shortness Of Breath, Itching, Other (See Comments)   Reaction:  Bruising    Penicillins Anaphylaxis, Rash, Other (See Comments)   Has patient had a PCN reaction causing immediate rash, facial/tongue/throat swelling, SOB or lightheadedness with hypotension: No Has patient had a PCN reaction causing severe rash involving mucus membranes or skin necrosis: No Has patient had a PCN reaction that required hospitalization No Has patient had a PCN reaction occurring within the last 10 years: No If all of the above answers are "NO", then may proceed with Cephalosporin use.   Wound Dressing Adhesive    Burns skin   Latex Rash   Neomycin Rash   Breaks out with neosporin        Medication List        Accurate as of October 18, 2022  2:07 PM. If you have any questions, ask your nurse or doctor.          acetaZOLAMIDE 250 MG tablet Commonly known as: DIAMOX Take 1/2 pill in the morning and 1 pill at bedtime. If side effects can take all in the evening. Also try to get to 2 pills a day preferably one pill twice daily but could be all at night if can't take pill in day due to side effects.   Calcium Carb-Cholecalciferol 500-10 MG-MCG Tabs Take 1 tablet by mouth in the morning, at noon, and at bedtime.   loratadine 10 MG tablet Commonly known as: Claritin Take 1 tablet (10 mg total) by mouth daily.   minoxidil 10 MG tablet Commonly known as: LONITEN Take 1 tablet (10 mg total) by mouth daily.   multivitamin tablet Take 1 tablet by mouth daily.   pantoprazole 40 MG tablet Commonly known as: PROTONIX TAKE 1 TABLET BY MOUTH ONCE daily AS NEEDED FOR heartburn   sucralfate  1 g tablet Commonly known as: CARAFATE Take by mouth.   Vitamin D (Ergocalciferol) 1.25 MG (50000 UNIT) Caps capsule Commonly known as: DRISDOL TAKE ONE CAPSULE BY MOUTH ONCE WEEKLY ON WEDNESDAY AND ONCE WEEKLY ON SUNDAY         ROS:  Constitutional: negative for chills, fatigue, and fevers Eyes: negative for visual disturbance and pain Ears, nose, mouth, throat, and face: negative for ear drainage, sore throat, and sinus problems Respiratory: negative for cough, wheezing, and shortness of breath Cardiovascular: negative for chest pain and palpitations Gastrointestinal: negative for abdominal  pain, nausea, reflux symptoms, and vomiting Genitourinary:negative for dysuria and frequency Integument/breast: negative for dryness and rash Hematologic/lymphatic: negative for bleeding and lymphadenopathy Musculoskeletal:negative for back pain and neck pain Neurological: negative for dizziness and tremors Endocrine: negative for temperature intolerance  Blood pressure 112/77, pulse (!) 56, temperature 98.2 F (36.8 C), temperature source Other (Comment), resp. rate 14, height '5\' 4"'$  (1.626 m), weight 195 lb (88.5 kg), SpO2 99 %. Physical Exam Vitals reviewed.  Constitutional:      Appearance: Normal appearance.  HENT:     Head: Normocephalic and atraumatic.  Eyes:     Extraocular Movements: Extraocular movements intact.     Pupils: Pupils are equal, round, and reactive to light.  Cardiovascular:     Rate and Rhythm: Normal rate and regular rhythm.  Pulmonary:     Effort: Pulmonary effort is normal.     Breath sounds: Normal breath sounds.  Abdominal:     General: There is no distension.     Palpations: Abdomen is soft.     Tenderness: There is no abdominal tenderness.     Comments: 1 cm skin tag in the right flank, nontender to palpation  Musculoskeletal:        General: Normal range of motion.     Cervical back: Normal range of motion.  Skin:    General: Skin is warm and  dry.  Neurological:     General: No focal deficit present.     Mental Status: She is alert and oriented to person, place, and time.  Psychiatric:        Mood and Affect: Mood normal.        Behavior: Behavior normal.     Results: No results found for this or any previous visit (from the past 48 hour(s)).  No results found.   Assessment & Plan:  Janet [name] is a 37 y.o. female who presents for evaluation of a right flank skin tag.  -I explained to the patient that she likely has a skin tag on her right flank which can be removed in office -The risk and benefits of skin tag removal were discussed including but not limited to bleeding and infection.  After careful consideration, Janet [name] has decided to proceed.  -Skin tag was removed without difficulty and will be sent to pathology for evaluation -Advised the patient to apply antibiotic ointment to the excision area -I will call her with the results of her pathology -Follow up as needed  All questions were answered to the satisfaction of the patient.  Bedside procedure note   Preoperative Diagnosis: Right flank skin tag Postoperative Diagnosis: Right flank skin tag   Procedure(s) Performed: Excisional of right flank skin tag   Performing provider: Barnetta Chapel Eilah Common, DO   Estimated Blood Loss: Minimal   Findings: Right flank skin tag, 1 cm in size   Procedure: At bedside, right flank was prepped with Betadine.  Verbal and written consent for were obtained from the patient prior to beginning of procedure.  Timeout was performed.  The area was localized with 1% lidocaine.  Using scalpel, the skin tag was excised.  Silver nitrate was placed in the surgical site to help with hemostasis.  Bacitracin was applied and Band-Aid was placed.  Skin tag was sent to pathology for evaluation.  Patient tolerated the procedure without issue.  Graciella Freer, DO [facility] [address] [place], [place] [address] [phone] (office)

## 2022-10-18 NOTE — Patient Instructions (Signed)
-  Apply antibiotic ointment to the area daily -Call with any issues

## 2022-10-20 ENCOUNTER — Ambulatory Visit: Payer: 59 | Admitting: Neurology

## 2022-10-20 ENCOUNTER — Encounter: Payer: Self-pay | Admitting: Neurology

## 2022-10-21 LAB — TISSUE SPECIMEN

## 2022-10-21 LAB — PATHOLOGY REPORT

## 2022-10-22 ENCOUNTER — Telehealth: Payer: Self-pay | Admitting: Neurology

## 2022-10-22 MED ORDER — FUROSEMIDE 20 MG PO TABS: 20.0000 mg | ORAL_TABLET | Freq: Every day | ORAL | 2 refills | Status: DC

## 2022-10-22 NOTE — Telephone Encounter (Signed)
   Sending a second opinion referral for this patient to Dr. Harvel Ricks at [place].  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  Complicated patient, unclear if she has IIH given her weight and her age, if her headaches and papilledema are as a result of her Chiari malformation or her IIH, she also may have focal stenosis in the lateral aspect of the left transverse sinus.    I would like to send the referral with a CD with an MRI of her brain, MRV of her head and CTV on it.  Would also like to include Dr. Katy Fitch, ophthalmologist, last notes.  Please let me know if there is any way I can assist you in getting this referral to Dr. Harvel Ricks.  Of note she Dr. Harvel Ricks is a neurosurgeon at [facility] who specialty is:   Dr. Harvel Ricks  Areas of focus Brain Tumors Chiari I Malformation Hydrocephalus Idiopathic Intracranial Hypertension Neuroendovascular Surgery Skull Base Surgery [facility] [place] [phone])

## 2022-10-24 ENCOUNTER — Telehealth (INDEPENDENT_AMBULATORY_CARE_PROVIDER_SITE_OTHER): Payer: 59 | Admitting: Surgery

## 2022-10-24 DIAGNOSIS — L918 Other hypertrophic disorders of the skin: Secondary | ICD-10-CM

## 2022-10-24 NOTE — Telephone Encounter (Signed)
[facility]  Called to update the patient on her pathology results after right flank skin tag removal.  Patient did not answer, so left a voicemail advising her that her pathology was benign.  Please call with any questions or concerns  Pathology: A Diagnosis    Comment: Benign lipofibroma.   Graciella Freer, DO [facility] [address] [place], [place] [address] [phone] (office)

## 2022-10-25 ENCOUNTER — Ambulatory Visit: Payer: 59 | Admitting: Family Medicine

## 2022-10-26 ENCOUNTER — Telehealth: Payer: Self-pay | Admitting: Neurology

## 2022-10-26 NOTE — Telephone Encounter (Addendum)
Referral for neurosurgery fax to [facility] with Dr. Caryl Comes. Phone: [phone], Fax: [phone].  Spoke with Hilda Blades in Medical Records. She said to call the pt to have her pick up CDs from [place]. Hilda Blades gave the direct number [phone] to request imaging CDs for MRI of the brain, MRV of the head and CTV.  Contacted pt to pick up CDs to take with her to appt with Dr. Grant Ruts office. She verbalize understand and will pick up CDs the day of the appt due to living in [place]. Also gave her to direct number to call to request.

## 2022-10-27 IMAGING — RF DG UGI W SINGLE CM
11 of 12 series · 14 of 24 positions shown · non-contrast
Comparison: CT abdomen 10/07/2019

CLINICAL DATA: Preoperative for bariatric surgery. Morbid obesity.
Gastroesophageal reflux.

EXAM:
UPPER GI SERIES WITH KUB
TECHNIQUE: After obtaining a scout radiograph a routine upper GI series was
performed using thin barium
FLUOROSCOPY:
Radiation Exposure Index (as provided by the fluoroscopic device):
28.6 mGy Kerma

[Series 3: t abdomen · 1 of 1 slices shown]
[im 1/1]
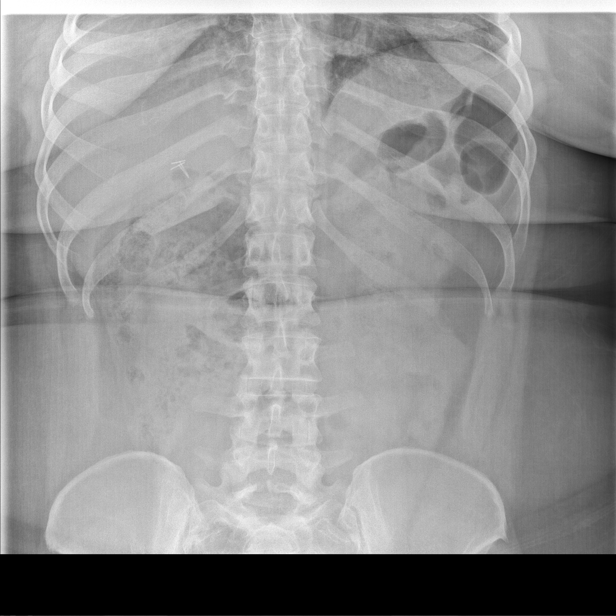

[Series 5: cp_standard · 1 of 32 frames shown (1 of 8)]
[frame 5/32]
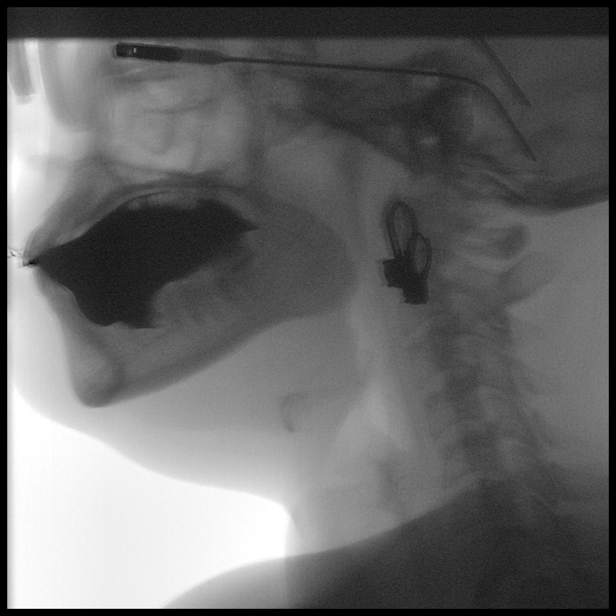

[Series 6: cp_standard · 1 of 25 frames shown (2 of 8)]
[frame 4/25]
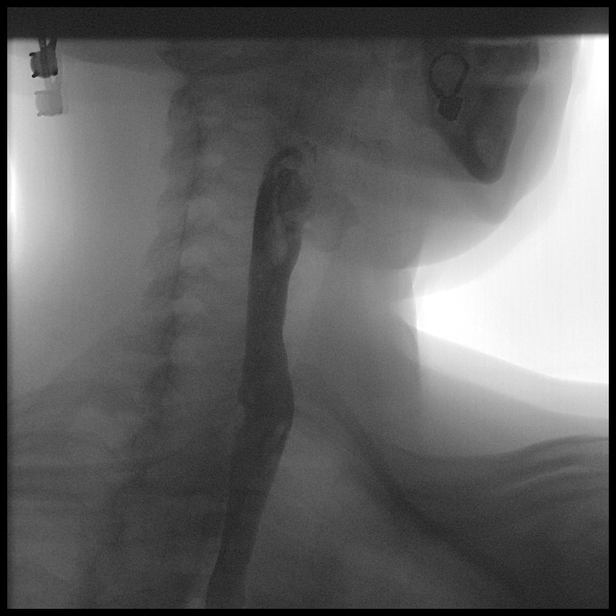

[Series 7: cp_standard · 2 of 29 frames shown (3 of 8)]
[frame 5/29]
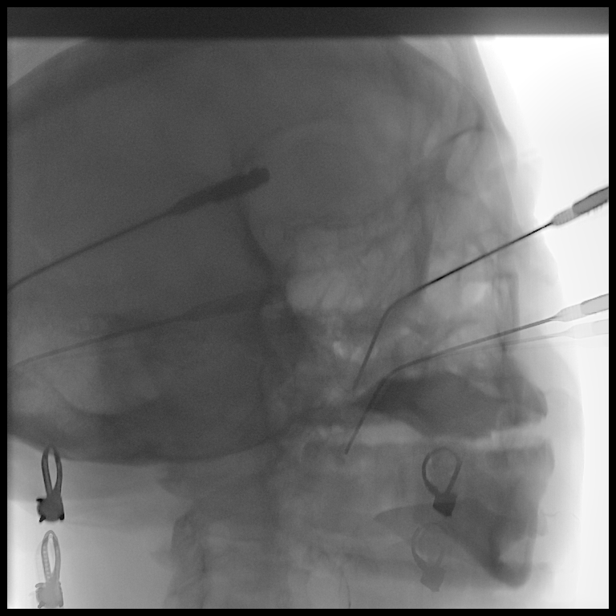
[frame 17/29]
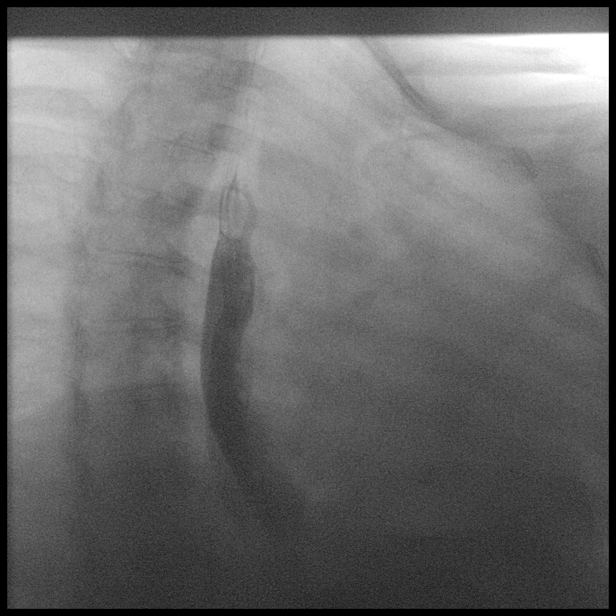

[Series 8: cp_standard · 1 of 27 frames shown (4 of 8)]
[frame 14/27]
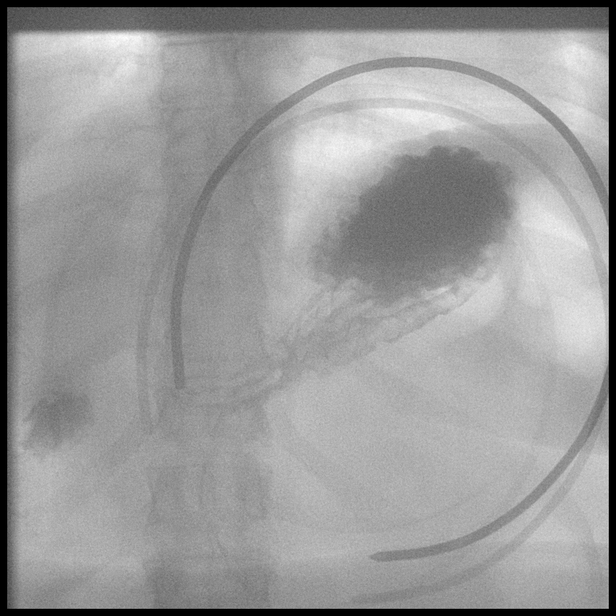

[Series 9: fluoro_barium 2fps_bw · 1 of 2 frames shown (1 of 2)]
[frame 2/2]
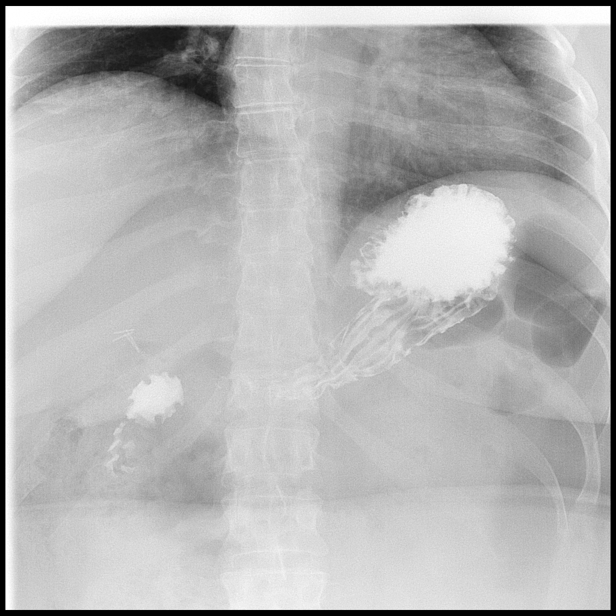

[Series 10: fluoro_barium 2fps_bw · 1 of 1 slices shown (2 of 2)]
[im 1/1]
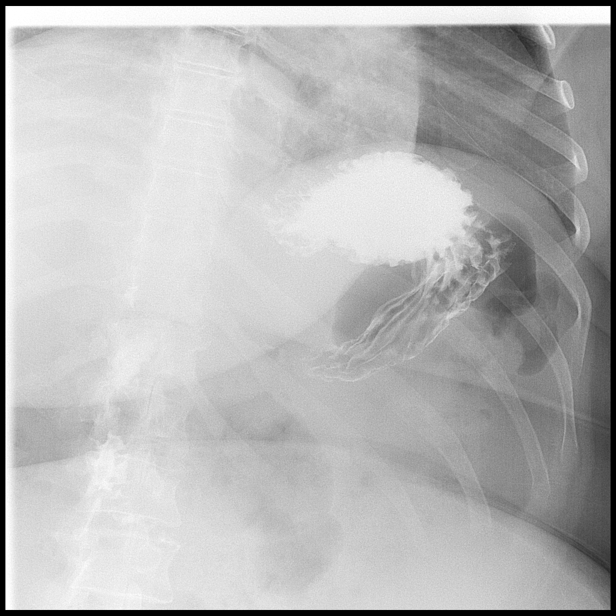

[Series 14: cp_standard · 2 of 34 frames shown (5 of 8)]
[frame 6/34]
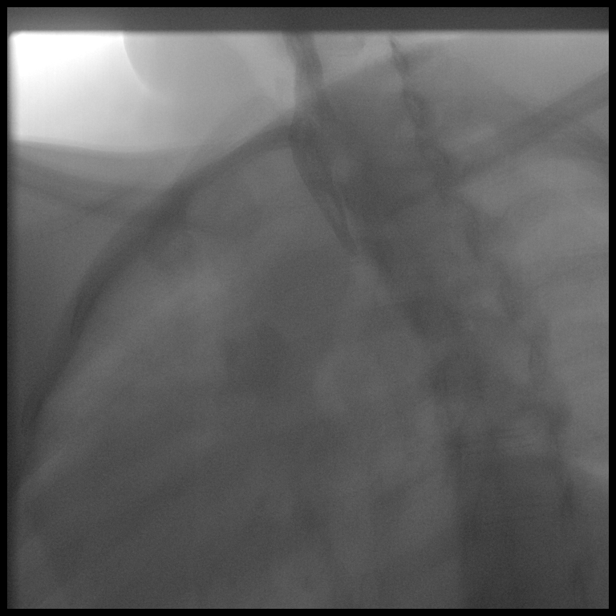
[frame 29/34]
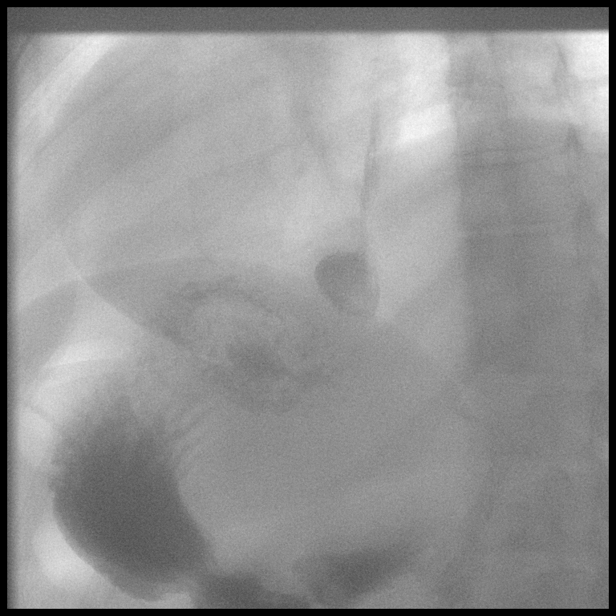

[Series 15: cp_standard · 1 of 41 frames shown (6 of 8)]
[frame 41/41]
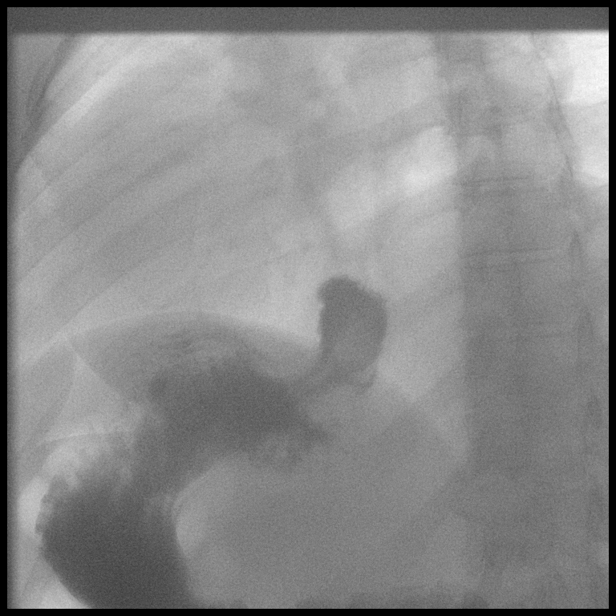

[Series 16: cp_standard · 1 of 41 frames shown (7 of 8)]
[frame 7/41]
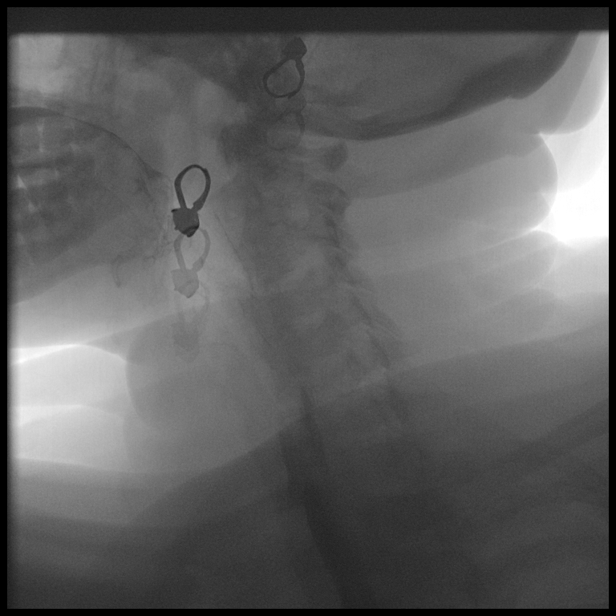

[Series 17: cp_standard · 2 of 29 frames shown (8 of 8)]
[frame 5/29]
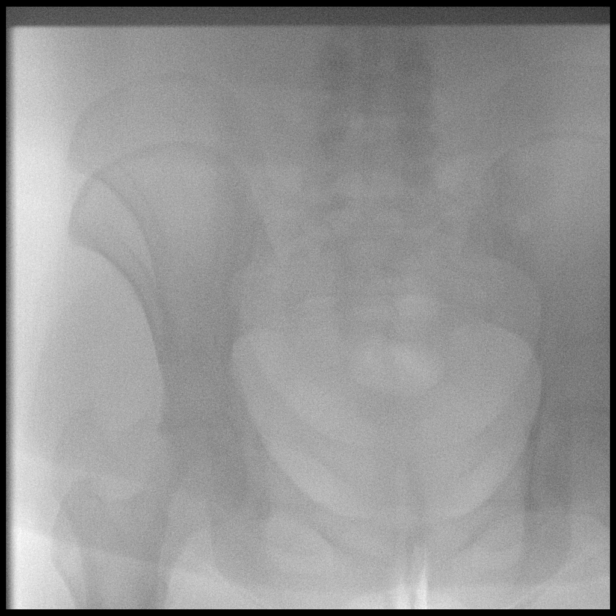
[frame 25/29]
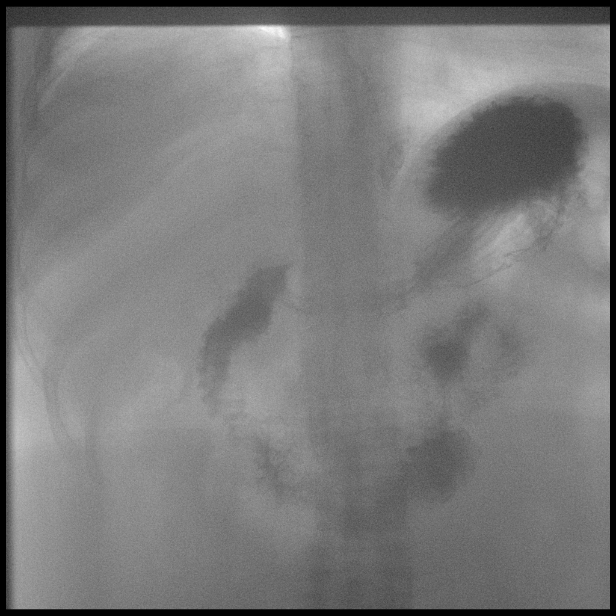

[14 of 24 positions shown; findings below may reference images not displayed]

FINDINGS: Initial KUB demonstrates cholecystectomy clips in the right upper
quadrant but appears otherwise normal.

The pharyngeal phase of swallowing appears normal.

Primary peristaltic waves are normal on [DATE] swallows.

There is a very small type 1 hiatal hernia. A small distal
esophageal mucosal ring is present on image 34 series 15, but this
ring is widely patent at 2.6 cm, and not expected cause symptoms.

Mucosal relief appearance of the stomach is normal. Likewise the
duodenum appears unremarkable with normal configuration and no
findings of malrotation. Normal proximal jejunal folds.
IMPRESSION: 1. Small type 1 hiatal hernia.
2. Widely patent distal esophageal mucosal ring (Schatzki ring),
luminal diameter the ring is 2.6 cm, rings of this size do not
typically cause symptoms.
3. Otherwise normal exam.

## 2022-10-28 ENCOUNTER — Encounter: Payer: Self-pay | Admitting: Adult Health

## 2022-10-28 ENCOUNTER — Ambulatory Visit: Payer: 59 | Admitting: Adult Health

## 2022-10-28 VITALS — BP 136/85 | HR 77 | Ht 64.0 in | Wt 188.0 lb

## 2022-10-28 DIAGNOSIS — R102 Pelvic and perineal pain: Secondary | ICD-10-CM | POA: Insufficient documentation

## 2022-10-28 DIAGNOSIS — N920 Excessive and frequent menstruation with regular cycle: Secondary | ICD-10-CM | POA: Diagnosis not present

## 2022-10-28 DIAGNOSIS — F17210 Nicotine dependence, cigarettes, uncomplicated: Secondary | ICD-10-CM | POA: Diagnosis not present

## 2022-10-28 LAB — POCT URINALYSIS DIPSTICK
Glucose, UA: NEGATIVE
Ketones, UA: NEGATIVE
Nitrite, UA: NEGATIVE
Protein, UA: POSITIVE — AB

## 2022-10-28 MED ORDER — PRENATAL PLUS 27-1 MG PO TABS: 1.0000 | ORAL_TABLET | Freq: Every day | ORAL | 12 refills | Status: DC

## 2022-10-28 NOTE — Progress Notes (Signed)
  Subjective:     Patient ID: Janet [name], female   DOB: Aug 07, 1986, 37 y.o.   MRN: [id number]  HPI Janet [name] is a 37 year old black female with SO, G2P2002 in complaining of spotting, periods due Tuesday, and has pelvic pressure.   Last pap was negative HPV,NILM 11/26/20  PCP is Dr Moshe Cipro  Review of Systems +pelvic Pressure ?UTI Spotting Reviewed past medical,surgical, social and family history. Reviewed medications and allergies.     Objective:   Physical Exam BP 136/85 (BP Location: Left Arm, Patient Position: Sitting, Cuff Size: Normal)   Pulse 77   Ht '5\' 4"'$  (1.626 m)   Wt 188 lb (85.3 kg)   LMP 10/01/2022 Comment: bleeding now  BMI 32.27 kg/m  urine dipstick large blood an protein >300 and trace leuks Skin warm and dry.Pelvic: external genitalia is normal in appearance no lesions, vagina: +blood,urethra has no lesions or masses noted, cervix:smooth and bulbous, uterus: normal size, shape and contour, non tender, no masses felt, adnexa: no masses or tenderness noted. Bladder is mildly tender and no masses felt.   Fall risk is low  Upstream - 10/28/22 1234       Pregnancy Intention Screening   Does the patient want to become pregnant in the next year? Ok Either Way    Does the patient's partner want to become pregnant in the next year? Ok Either Way    Would the patient like to discuss contraceptive options today? No      Contraception Wrap Up   Current Method Vasectomy    End Method Vasectomy    Contraception Counseling Provided No            Examination chaperoned by Levy Pupa LPN  Assessment:     1. Pelvic pressure in female Tender over bladder UA C&S sent   2. Spotting Period due Tuesday Requests PNV refill Meds ordered this encounter  Medications   prenatal vitamin w/FE, FA (PRENATAL 1 + 1) 27-1 MG TABS tablet    Sig: Take 1 tablet by mouth daily at 12 noon.    Dispense:  30 tablet    Refill:  12    Order Specific Question:   Supervising  Provider    Answer:   Florian Buff [[id number]]       Plan:     Follow up prn

## 2022-10-29 LAB — URINALYSIS, ROUTINE W REFLEX MICROSCOPIC
Glucose, UA: NEGATIVE
Nitrite, UA: NEGATIVE
Specific Gravity, UA: >=1.03 — AB (ref 1.005–1.030)
Urobilinogen, Ur: 1 mg/dL (ref 0.2–1.0)
pH, UA: 6 (ref 5.0–7.5)

## 2022-10-29 LAB — MICROSCOPIC EXAMINATION
Casts: NONE SEEN /lpf
RBC, Urine: >30 /hpf — AB (ref 0–2)
WBC, UA: >30 /hpf — AB (ref 0–5)

## 2022-11-01 ENCOUNTER — Other Ambulatory Visit: Payer: Self-pay | Admitting: Adult Health

## 2022-11-01 LAB — URINE CULTURE

## 2022-11-01 MED ORDER — SULFAMETHOXAZOLE-TRIMETHOPRIM 800-160 MG PO TABS: 1.0000 | ORAL_TABLET | Freq: Two times a day (BID) | ORAL | 0 refills | Status: DC

## 2022-11-01 NOTE — Progress Notes (Signed)
Urine was + proteus, rx for septra ds sent to [facility], push fluids

## 2022-11-08 ENCOUNTER — Encounter: Payer: Self-pay | Admitting: Skilled Nursing Facility1

## 2022-11-08 ENCOUNTER — Encounter: Payer: 59 | Attending: General Surgery | Admitting: Skilled Nursing Facility1

## 2022-11-08 VITALS — Ht 64.0 in | Wt 185.1 lb

## 2022-11-08 DIAGNOSIS — E669 Obesity, unspecified: Secondary | ICD-10-CM | POA: Insufficient documentation

## 2022-11-08 NOTE — Progress Notes (Signed)
Bariatric Nutrition Follow-Up Visit Medical Nutrition Therapy  Surgery date: 06/21/2022 Surgery type: RYGB  Anthropometrics  Start weight at [facility]: 248.4 lbs (date: 04/08/2022)   Weight today: 185.1 pounds   Clinical  Medical hx: Sleep apnea, obesity, GERD, Nerve/muscle disease, anemia, heart murmur, Cushing's Syndrome Medications: Vit D  Labs: per patient 09 Mar 2022: Vit D 15.6; A1C 6.1; Chol 179; LDL 106; HDL 48 Notable signs/symptoms: none noted Any previous deficiencies? No   Body Composition Scale 07/05/2022 08/16/2022 11/08/2022  Current Body Weight 219.1 203.8 185.1  Total Body Fat % 41.4 39 36.1  Visceral Fat '11 10 9  '$ Fat-Free Mass % 58.5 60.9 63.8   Total Body Water % 43.7 44.9 46.4  Muscle-Mass lbs 32.1 32.2 31.9  BMI 37.5 34.8 31.6  Body Fat Displacement            Torso  lbs 56.2 49.2 41.3         Left Leg  lbs 11.2 9.8 8.2         Right Leg  lbs 11.2 9.8 8.2         Left Arm  lbs 5.6 4.9 4.1         Right Arm   lbs 5.6 4.9 4.1     Lifestyle & Dietary Hx  Pt states incorporating new and different foods really helped with tolerating food. Pt states she still does not fit a lot but does not vomit up her foods and is doing good with getting in her fluids and has started lasix due to the intercranial HTN.   Pt states she needed the rehydration clinic due to feeling really sick realizing now that she is talking about feels so much better and can tolerate food better since starting her lasix.  Pt states she adds protein powder to foods that do not have a lot of protein. Pt states regular soup is doing really well.  Pt states she has not vomited in a week and does not feel dehydrated being able to go to the gym daily.  Pt states she has not been logging or tracking so is unsure of exacts for protein: dietitian advised to track/log for next visit.  Pt states she can tolerate oatmeal with fruit and almonds.   Pt staets she completes her MBA in 2 weeks.  At last visit  pts stomach seemed distended at this visit that has receded completely: pt states she noticed this as well right after taking the lasix she experienced a lot of relief also stating after taking the lasix she stopped vomiting and could tolerate more foods.   Pt state last she saw her labs she was low in potassium.    Estimated daily fluid intake: 25-64 oz Estimated daily protein intake:  Supplements: multi and calcium  Current average weekly physical activity: 60 minutes  days a week at 3.5 mph incline of 5 and strength training   24-Hr Dietary Recall First Meal: skipped Snack:   Second Meal 12: grilled chicken in broth + spinach and mushroom (1.5 ounces food total guesstimate) Snack:   Third Meal 6pm: grilled chicken in broth + spinach and mushroom (1.5 ounces food total guesstimate) Snack:  Beverages: 1 bottle water (16.9oz), fairlife 30g (3/4 of it forcing it)  Post-Op Goals/ Signs/ Symptoms Using straws: no Drinking while eating: no Chewing/swallowing difficulties: no Changes in vision: no Changes to mood/headaches: no Hair loss/changes to skin/nails: no Difficulty focusing/concentrating: no Sweating: no Limb weakness: no Dizziness/lightheadedness: no Palpitations: no  Carbonated/caffeinated beverages: no N/V/D/C/Gas: constipation and vomiting  Abdominal pain: no Dumping syndrome: no    NUTRITION DIAGNOSIS  Overweight/obesity (-3.3) related to past poor dietary habits and physical inactivity as evidenced by completed bariatric surgery and following dietary guidelines for continued weight loss and healthy nutrition status.     NUTRITION INTERVENTION Nutrition counseling (C-1) and education (E-2) to facilitate bariatric surgery goals, including: The importance of consuming adequate calories as well as certain nutrients daily due to the body's need for essential vitamins, minerals, and fats The importance of daily physical activity and to reach a goal of at least 150  minutes of moderate to vigorous physical activity weekly (or as directed by their physician) due to benefits such as increased musculature and improved lab values The importance of intuitive eating specifically learning hunger-satiety cues and understanding the importance of learning a new body: The importance of mindful eating to avoid grazing behaviors  Continue: Make all foods fork tender: make all meats WET: use the crockpot with sauces and broths Why you need complex carbohydrates: Whole grains and other complex carbohydrates are required to have a healthy diet. Whole grains provide fiber which can help with blood glucose levels and help keep you satiated. Fruits and starchy vegetables provide essential vitamins and minerals required for immune function, eyesight support, brain support, bone density, wound healing and many other functions within the body. According to the current evidenced based 2020-2025 Dietary Guidelines for Americans, complex carbohydrates are part of a healthy eating pattern which is associated with a decreased risk for type 2 diabetes, cancers, and cardiovascular disease.  Purpose of protein: Every cell in your body has protein. Protein is essential for the structure, function and regulation of tissues and organs within the body. Without protein enzymes and antibodies would not exist, and cells would lack storage, transportation, and messenger systems. According to [facility]. [place], the body is made up of at least 10000 different proteins. Lack of protein can lead to growth failure in children, loss of muscle mass, decreased immune system function, and overall weakening of various organs in the body.  [email or web address], [email or web address].be, [email or web address].tn Purpose of hydration: Water makes up over 50% of your total body water, and is part of  many organs throughout the body. Water is essential to transport digested nutrients, regulate body temperature, rid the body of waste products, and protects joints and the spinal cord. When not properly hydrated you will begin to experience headaches, cramps and dizziness. Further dehydration can result in rapid heart rate, shock, oliguria, and may cause seizures.  https://www.merckmanuals.com/home/hormonal-and-metabolic-disordehttps://www.usgs.gov/special-topic/water-science-school/science/water-you-water-and-human-body?qt-science_center_objects=0#qt-science_center_objectsrs/water-balance/about-body-water HistoricalGrowth.gl https://www.stevens.org/ PimpTShirt.fi https://www.health.InvestmentBrowse.at Educated pt on the need to be out of a catabolic state before starting a vigorous workout routine   Goals: Speak with your doctor about if you need a potassium supplement  Have 3 liquacels per day  Eat breakfast; avoid going longer than 3 hours without eating Mashed potatoes are probably well tolerated  Limit activity to: walking with no incline at 3.5 mph and no weight lifting until you have met your 80 gram of protein need and fluid ounce recommendation without any vomiting for a solid 14 days before you increase your workouts    Learning Style & Readiness for Change Teaching method utilized: Visual & Auditory  Demonstrated degree of understanding via: Teach Back  Readiness Level: action Barriers to learning/adherence to lifestyle change: Low tolerance of foods  RD's Notes for Next Visit Assess adherence to pt chosen goals    MONITORING & EVALUATION Dietary  intake, weekly physical activity, body weight  Next Steps Patient is to follow-up in 3 weeks

## 2022-11-29 ENCOUNTER — Encounter: Payer: Self-pay | Admitting: Skilled Nursing Facility1

## 2022-11-29 ENCOUNTER — Encounter: Payer: 59 | Attending: General Surgery | Admitting: Skilled Nursing Facility1

## 2022-11-29 VITALS — Ht 64.0 in | Wt 184.1 lb

## 2022-11-29 DIAGNOSIS — E669 Obesity, unspecified: Secondary | ICD-10-CM | POA: Insufficient documentation

## 2022-11-29 NOTE — Progress Notes (Signed)
Bariatric Nutrition Follow-Up Visit Medical Nutrition Therapy  Surgery date: 06/21/2022 Surgery type: RYGB  Anthropometrics  Start weight at [facility]: 248.4 lbs (date: 04/08/2022)   Weight today: 184.1 pounds   Clinical  Medical hx: Sleep apnea, obesity, GERD, Nerve/muscle disease, anemia, heart murmur, Cushing's Syndrome Medications: Vit D  Labs: per patient 09 Mar 2022: Vit D 15.6; A1C 6.1; Chol 179; LDL 106; HDL 48 Notable signs/symptoms: none noted Any previous deficiencies? No   Body Composition Scale 07/05/2022 08/16/2022 11/08/2022 11/29/2022  Current Body Weight 219.1 203.8 185.1 184.1  Total Body Fat % 41.4 39 36.1 35.9  Visceral Fat 11 10 9 9  $ Fat-Free Mass % 58.5 60.9 63.8 64   Total Body Water % 43.7 44.9 46.4 46.5  Muscle-Mass lbs 32.1 32.2 31.9 31.9  BMI 37.5 34.8 31.6 31.4  Body Fat Displacement             Torso  lbs 56.2 49.2 41.3 40.9         Left Leg  lbs 11.2 9.8 8.2 8.1         Right Leg  lbs 11.2 9.8 8.2 8.1         Left Arm  lbs 5.6 4.9 4.1 4.0         Right Arm   lbs 5.6 4.9 4.1 4.0     Lifestyle & Dietary Hx  Pt states she is working on her [employer].    Pt states she is low in energy  Pt states she tried the liquacel but they were truly gross.  Pt states she is practicing lent: [employer] 6am-12pm fasting.  Pt states she has been trying new and different foods and protein supplements. Pt states anything wet goes really well. Pt states the foods she has been trying has gone well such as chili beans.  Pt states she vomited once from overeating (about 2 weeks ago).   Pt tried tofu and liked and plans on eating more and trying edamame. Pt is confident she can meet her protein needs stating not being able to workout as much as she wants has been a  strong motivator to meet her needs.  Dietitian set pt up for a 6 week appt pt states she thinks she will meet her needs in the next 2 weeks so dietitian advised pt if she takes a picture log of her foods and send them  in they can discuss next steps.    Estimated daily fluid intake: 64 oz Estimated daily protein intake:  Supplements: multi and calcium  Current average weekly physical activity: 60 minutes  days a week at 3.5 mph incline of 5 and strength training   24-Hr Dietary Recall First Meal 5:30am: greek yogurt + protein granola (ate half the serving) Snack:  Second Meal 12:30: 5 bites of baked potato + bacon + cheese Snack:   Third Meal 5pm: grilled chicken + broccoli + lemon + mashed potato  Snack:  Beverages: 1 bottle water, fairlife 30g (3/4 of it forcing it)  Post-Op Goals/ Signs/ Symptoms Using straws: no Drinking while eating: no Chewing/swallowing difficulties: no Changes in vision: no Changes to mood/headaches: no Hair loss/changes to skin/nails: no Difficulty focusing/concentrating: no Sweating: no Limb weakness: no Dizziness/lightheadedness: no Palpitations: no  Carbonated/caffeinated beverages: no N/V/D/C/Gas: constipation and vomiting  Abdominal pain: no Dumping syndrome: no    NUTRITION DIAGNOSIS  Overweight/obesity ([place]-3.3) related to past poor dietary habits and physical inactivity as evidenced by completed bariatric surgery and following dietary guidelines for  continued weight loss and healthy nutrition status.     NUTRITION INTERVENTION: continued Nutrition counseling (C-1) and education (E-2) to facilitate bariatric surgery goals, including: The importance of consuming adequate calories as well as certain nutrients daily due to the body's need for essential vitamins, minerals, and fats The importance of daily physical activity and to reach a goal of at least 150 minutes of moderate to vigorous physical activity weekly (or as directed by their physician) due to benefits such as increased musculature and improved lab values The importance of intuitive eating specifically learning hunger-satiety cues and understanding the importance of learning a new body: The  importance of mindful eating to avoid grazing behaviors  Continue: Make all foods fork tender: make all meats WET: use the crockpot with sauces and broths Why you need complex carbohydrates: Whole grains and other complex carbohydrates are required to have a healthy diet. Whole grains provide fiber which can help with blood glucose levels and help keep you satiated. Fruits and starchy vegetables provide essential vitamins and minerals required for immune function, eyesight support, brain support, bone density, wound healing and many other functions within the body. According to the current evidenced based 2020-2025 Dietary Guidelines for Americans, complex carbohydrates are part of a healthy eating pattern which is associated with a decreased risk for type 2 diabetes, cancers, and cardiovascular disease.  Purpose of protein: Every cell in your body has protein. Protein is essential for the structure, function and regulation of tissues and organs within the body. Without protein enzymes and antibodies would not exist, and cells would lack storage, transportation, and messenger systems. According to [facility]. [place], the body is made up of at least 10000 different proteins. Lack of protein can lead to growth failure in children, loss of muscle mass, decreased immune system function, and overall weakening of various organs in the body.  [email or web address], [email or web address].be, VentureZip.tn Purpose of hydration: Water makes up over 50% of your total body water, and is part of many organs throughout the body. Water is essential to transport digested nutrients, regulate body temperature, rid the body of waste products, and protects joints and the spinal cord. When not properly hydrated you will begin to experience headaches, cramps and dizziness. Further dehydration can  result in rapid heart rate, shock, oliguria, and may cause seizures.  https://www.merckmanuals.com/home/hormonal-and-metabolic-disordehttps://www.usgs.gov/special-topic/water-science-school/science/water-you-water-and-human-body?qt-science_center_objects=0#qt-science_center_objectsrs/water-balance/about-body-water HistoricalGrowth.gl https://www.stevens.org/ PimpTShirt.fi https://www.health.InvestmentBrowse.at Educated pt on the need to be out of a catabolic state before starting a vigorous workout routine   Goals: Have a protein shake during fasting Eat/get protein in every 3 hours   Learning Style & Readiness for Change Teaching method utilized: Visual & Auditory  Demonstrated degree of understanding via: Teach Back  Readiness Level: action Barriers to learning/adherence to lifestyle change: Low tolerance of foods  RD's Notes for Next Visit Assess adherence to pt chosen goals    MONITORING & EVALUATION Dietary intake, weekly physical activity, body weight  Next Steps Patient is to follow-up in 6 weeks

## 2022-11-30 ENCOUNTER — Encounter: Payer: Self-pay | Admitting: Family Medicine

## 2022-11-30 ENCOUNTER — Ambulatory Visit: Payer: 59 | Admitting: Family Medicine

## 2022-11-30 VITALS — BP 158/92 | HR 60 | Resp 16 | Ht 64.0 in | Wt 186.0 lb

## 2022-11-30 DIAGNOSIS — R079 Chest pain, unspecified: Secondary | ICD-10-CM

## 2022-11-30 DIAGNOSIS — R011 Cardiac murmur, unspecified: Secondary | ICD-10-CM

## 2022-11-30 DIAGNOSIS — I10 Essential (primary) hypertension: Secondary | ICD-10-CM | POA: Diagnosis not present

## 2022-11-30 DIAGNOSIS — R002 Palpitations: Secondary | ICD-10-CM | POA: Diagnosis not present

## 2022-11-30 DIAGNOSIS — E669 Obesity, unspecified: Secondary | ICD-10-CM

## 2022-11-30 MED ORDER — AMLODIPINE BESYLATE 2.5 MG PO TABS: 2.5000 mg | ORAL_TABLET | Freq: Every day | ORAL | 2 refills | Status: DC

## 2022-11-30 MED ORDER — LISINOPRIL 10 MG PO TABS: 10.0000 mg | ORAL_TABLET | Freq: Every day | ORAL | 3 refills | Status: DC

## 2022-11-30 NOTE — Progress Notes (Signed)
Janet [name]     MRN: [id number]      DOB: 1986-09-12   HPI Janet [name] is here c/o 2 episodes of left chest pain last week while at work duration approx 10 to 15 mins, nausea, light heasded, denioes anxiety, felt hot, almost went to ED radiated to left shoulder to shoulderblade C/o intermittent palpitations over the past years has bveen eval in the past by Card Nausea and GI symptoms post surgery are lessening still has low protein ROS Denies recent fever or chills. Denies sinus pressure, nasal congestion, ear pain or sore throat. Denies chest congestion, productive cough or wheezing.  Denies abdominal pain, vomiting,diarrhea or constipation.   Denies dysuria, frequency, hesitancy or incontinence. Denies joint pain, swelling and limitation in mobility. Denies headaches, seizures, numbness, or tingling. Denies depression, anxiety or insomnia. Denies skin break down or rash.   PE  BP (!) 158/92   Pulse 60   Resp 16   Ht '5\' 4"'$  (1.626 m)   Wt 186 lb (84.4 kg)   SpO2 98%   BMI 31.93 kg/m   Patient alert and oriented and in no cardiopulmonary distress.  HEENT: No facial asymmetry, EOMI,     Neck supple .  Chest: Clear to auscultation bilaterally.No reproducible chest wall pain EKG; No acute ischemia, sinus rythm  CVS: S1, S2 no murmurs, no S3.Regular rate.  ABD: Soft non tender.   Ext: No edema  MS: Adequate ROM spine, shoulders, hips and knees.  Skin: Intact, no ulcerations or rash noted.  Psych: Good eye contact, normal affect. Memory intact not anxious or depressed appearing.  CNS: CN 2-12 intact, power,  normal throughout.no focal deficits noted.   Assessment & Plan Hypertension DASH diet and commitment to daily physical activity for a minimum of 30 minutes discussed and encouraged, as a part of hypertension management. The importance of attaining a healthy weight is also discussed.     12/02/2022    1:00 AM 12/01/2022   10:15 PM 12/01/2022    6:51 PM  12/01/2022    6:50 PM 11/30/2022    3:27 PM 11/30/2022    2:53 PM 11/29/2022    8:19 AM  BP/Weight  Systolic BP [id number]  [id number] 0000000 0000000   Diastolic BP 63 99991111  96 92 63   Wt. (Lbs)   185   186 184.1  BMI   31.76 kg/m2   31.93 kg/m2 31.6 kg/m2     Lisinopril 10 mg prescribed  Chest pain Needs cardiolgy evaluation base don recurrent symptom severity EKG negative for acute ischemia, bradycardia  noted   Palpitations Reports recurrent palpitations , not accompanied with symptoms of panic/ anxiety, has bradycrdia on EKG , cardiolgy eval  Heart murmur Reports bein told that she had a heart  murmur, refer cardiology fo full eval  Obesity (BMI 30.0-34.9)  Patient re-educated about  the importance of commitment to a  minimum of 150 minutes of exercise per week as able.  The importance of healthy food choices with portion control discussed, as well as eating regularly and within a 12 hour window most days. The need to choose "clean , green" food 50 to 75% of the time is discussed, as well as to make water the primary drink and set a goal of 64 ounces water daily.       12/01/2022    6:51 PM 11/30/2022    2:53 PM 11/29/2022    8:19 AM  Weight /BMI  Weight 185 lb 186  lb 184 lb 1.6 oz  Height '5\' 4"'$  (1.626 m) '5\' 4"'$  (1.626 m) '5\' 4"'$  (1.626 m)  BMI 31.76 kg/m2 31.93 kg/m2 31.6 kg/m2

## 2022-11-30 NOTE — Patient Instructions (Addendum)
F/U in 2 months, call if you need me sooner   Start lisinopril 10 mg 1 daily for blood pressure since this is elevated.  You are referred to cardiology because of new chest pain as well as irregular heartbeat and actually today your heart rate is low at 48 when the regular rate is 60 and above  Thanks for choosing [facility], we consider it a privelige to serve you.

## 2022-12-01 ENCOUNTER — Encounter (HOSPITAL_COMMUNITY): Payer: Self-pay

## 2022-12-01 ENCOUNTER — Other Ambulatory Visit: Payer: Self-pay

## 2022-12-01 ENCOUNTER — Emergency Department (HOSPITAL_COMMUNITY): Payer: 59

## 2022-12-01 ENCOUNTER — Emergency Department (HOSPITAL_COMMUNITY)
Admission: EM | Admit: 2022-12-01 | Discharge: 2022-12-02 | Disposition: A | Discharge status: Home / Self Care | Payer: 59 | Attending: Emergency Medicine | Admitting: Emergency Medicine

## 2022-12-01 DIAGNOSIS — R079 Chest pain, unspecified: Secondary | ICD-10-CM | POA: Diagnosis present

## 2022-12-01 DIAGNOSIS — R072 Precordial pain: Secondary | ICD-10-CM | POA: Diagnosis not present

## 2022-12-01 DIAGNOSIS — I10 Essential (primary) hypertension: Secondary | ICD-10-CM | POA: Diagnosis not present

## 2022-12-01 DIAGNOSIS — R001 Bradycardia, unspecified: Secondary | ICD-10-CM | POA: Diagnosis not present

## 2022-12-01 LAB — CBC
HCT: 35.3 % — ABNORMAL LOW (ref 36.0–46.0)
Hemoglobin: 11.6 g/dL — ABNORMAL LOW (ref 12.0–15.0)
MCH: 28.5 pg (ref 26.0–34.0)
MCHC: 32.9 g/dL (ref 30.0–36.0)
MCV: 86.7 fL (ref 80.0–100.0)
Platelets: 366 10*3/uL (ref 150–400)
RBC: 4.07 MIL/uL (ref 3.87–5.11)
RDW: 13.4 % (ref 11.5–15.5)
WBC: 7 10*3/uL (ref 4.0–10.5)
nRBC: 0 % (ref 0.0–0.2)

## 2022-12-01 LAB — BASIC METABOLIC PANEL
Anion gap: 7 (ref 5–15)
BUN: 7 mg/dL (ref 6–20)
CO2: 27 mmol/L (ref 22–32)
Calcium: 8.3 mg/dL — ABNORMAL LOW (ref 8.9–10.3)
Chloride: 105 mmol/L (ref 98–111)
Creatinine, Ser: 0.72 mg/dL (ref 0.44–1.00)
GFR, Estimated: >60 mL/min (ref >60)
Glucose, Bld: 96 mg/dL (ref 70–99)
Potassium: 3.3 mmol/L — ABNORMAL LOW (ref 3.5–5.1)
Sodium: 139 mmol/L (ref 135–145)

## 2022-12-01 LAB — TROPONIN I (HIGH SENSITIVITY)
Troponin I (High Sensitivity): 4 ng/L (ref <18)
Troponin I (High Sensitivity): 3 ng/L (ref <18)

## 2022-12-01 NOTE — ED Triage Notes (Signed)
Pt with hx of intermittent chest pain and has a referral to a cardiologist from her PCP and having chest pains today in the left side of her chest radiating into her right shoulder blade.

## 2022-12-01 NOTE — ED Provider Notes (Signed)
Emergency Department Provider Note   I have reviewed the triage vital signs and the nursing notes.   HISTORY  Chief Complaint Chest Pain   HPI Janet [name] is a 37 y.o. female with past history reviewed including GERD and prediabetes presents emergency department with acute onset sharp left-sided chest pain.  Pain is no longer present and began without clear provoking factor.  Pain is sharp with some radiation to the left arm.  No shortness of breath.  Pain lasted for around 15 minutes and then resolved.  No fevers or chills.  She had a similar episode of pain last week and discussed this with her PCP who has since referred her to cardiology but she has not followed as of yet.  No prior history of ACS or PE.  No leg pain or swelling.   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   on MRI   Constipation    Cushing syndrome ([place]) 2011   r/t adrenal tumor; tumor removed, disease resolved   Cushing's syndrome ([place]) 11/27/2013   Gallbladder problem    Gastroesophageal reflux disease    GERD (gastroesophageal reflux disease)    Phreesia 08/12/2020   Headache    Heart murmur    Phreesia 08/12/2020   Hematuria 09/18/2015   Hemorrhoids    History of Chiari malformation    Hypertension    Phreesia 08/12/2020   Internal hemorrhoids with other complication [id number]   JAN 2015 R ANT MAR 2015 R POS AND L LAT BAND    Liver mass    Menstrual periods irregular 09/18/2015   Migraine without aura, with intractable migraine, so stated, without mention of status migrainosus 11/11/2013   Palpitations    Patient desires pregnancy 11/27/2013   Plantar fasciitis    Pre-diabetes    Prediabetes    Preeclampsia 05/21/2019   2x/wk testing nst alt w/  bpp/dopp      Deliver @ 37wks        IOL scheduled for 8/19 @ 0830   Pregnant 12/16/2015   Sleep apnea    SOBOE (shortness of breath on exertion)    Swelling of both lower extremities    Vitamin D deficiency     Review of Systems  Constitutional: No fever/chills Eyes: No visual changes. ENT: No sore throat. Cardiovascular: Positive chest pain. Respiratory: Denies shortness of breath. Gastrointestinal: No abdominal pain.  No nausea, no vomiting.  No diarrhea.  No constipation. Genitourinary: Negative for dysuria. Musculoskeletal: Negative for back pain. Skin: Negative for rash. Neurological: Negative for headaches, focal weakness or numbness.  ____________________________________________   PHYSICAL EXAM:  VITAL SIGNS: ED Triage Vitals  Enc Vitals Group     BP 12/01/22 1850 (!) 156/96     Pulse Rate 12/01/22 1850 62     Resp 12/01/22 1850 16     Temp 12/01/22 1850 98.2 F (36.8 C)     Temp Source 12/01/22 1850 Oral     SpO2 12/01/22 1850 100 %     Weight 12/01/22 1851 185 lb (83.9 kg)     Height 12/01/22 1851 '5\' 4"'$  (1.626 m)   Constitutional: Alert and oriented. Well appearing and in no acute  distress. Eyes: Conjunctivae are normal.  Head: Atraumatic. Nose: No congestion/rhinnorhea. Mouth/Throat: Mucous membranes are moist. Neck: No stridor.   Cardiovascular: Normal rate, regular rhythm. Good peripheral circulation. Grossly normal heart sounds.   Respiratory: Normal respiratory effort.  No retractions. Lungs CTAB. Gastrointestinal: Soft and nontender. No distention.  Musculoskeletal: No lower extremity tenderness nor edema. No gross deformities of extremities. Neurologic:  Normal speech and language. No gross focal neurologic deficits are appreciated.  Skin:  Skin is warm, dry and intact. No rash noted.  ____________________________________________   LABS (all labs ordered are listed, but only abnormal results are displayed)  Labs Reviewed  BASIC METABOLIC  PANEL - Abnormal; Notable for the following components:      Result Value   Potassium 3.3 (*)    Calcium 8.3 (*)    All other components within normal limits  CBC - Abnormal; Notable for the following components:   Hemoglobin 11.6 (*)    HCT 35.3 (*)    All other components within normal limits  D-DIMER, QUANTITATIVE - Abnormal; Notable for the following components:   D-Dimer, Quant 0.95 (*)    All other components within normal limits  POC URINE PREG, ED  TROPONIN I (HIGH SENSITIVITY)  TROPONIN I (HIGH SENSITIVITY)   ____________________________________________  EKG   EKG Interpretation  Date/Time:  Thursday December 01 2022 18:48:21 EST Ventricular Rate:  58 PR Interval:  146 QRS Duration: 88 QT Interval:  416 QTC Calculation: 408 R Axis:   8 Text Interpretation: Sinus bradycardia with marked sinus arrhythmia Cannot rule out Anterior infarct , age undetermined Abnormal ECG When compared with ECG of 21-Jan-2022 11:22, No significant change was found Confirmed by Nanda Quinton [phone]) on 12/01/2022 10:57:50 PM        ____________________________________________  RADIOLOGY  CT Angio Chest PE W and/or Wo Contrast  Result Date: 12/02/2022 CLINICAL DATA:  Pulmonary embolism (PE) suspected, low to intermediate prob, positive D-dimer EXAM: CT ANGIOGRAPHY CHEST WITH CONTRAST TECHNIQUE: Multidetector CT imaging of the chest was performed using the standard protocol during bolus administration of intravenous contrast. Multiplanar CT image reconstructions and MIPs were obtained to evaluate the vascular anatomy. RADIATION DOSE REDUCTION: This exam was performed according to the departmental dose-optimization program which includes automated exposure control, adjustment of the mA and/or kV according to patient size and/or use of iterative reconstruction technique. CONTRAST:  144m OMNIPAQUE IOHEXOL 350 MG/ML SOLN COMPARISON:  Chest x-ray 12/01/2022 FINDINGS: Cardiovascular: Satisfactory  opacification of the pulmonary arteries to the segmental level. No evidence of pulmonary embolism. Normal heart size. No significant pericardial effusion. The thoracic aorta is normal in caliber. No atherosclerotic plaque of the thoracic aorta. No coronary artery calcifications. Mediastinum/Nodes: No enlarged mediastinal, hilar, or axillary lymph nodes. Thyroid gland, trachea, and esophagus demonstrate no significant findings. Lungs/Pleura: No focal consolidation. No pulmonary nodule. No pulmonary mass. No pleural effusion. No pneumothorax. Upper Abdomen: Roux-en-Y gastric bypass surgical changes. Status post cholecystectomy. No acute abnormality. Musculoskeletal: No chest wall abnormality. No suspicious lytic or blastic osseous lesions. No acute displaced fracture. Review of the MIP images confirms the above findings. IMPRESSION: No pulmonary embolus. No acute intrathoracic abnormality. Electronically Signed   By: MIven FinnM.D.   On: 12/02/2022 00:59   DG Chest 2 View  Result Date: 12/01/2022 CLINICAL DATA:  Chest pain EXAM: CHEST - 2 VIEW COMPARISON:  05/23/2022 FINDINGS: No consolidation, pneumothorax or effusion. Normal cardiopericardial silhouette without edema. Surgical clips in the right upper quadrant of the abdomen. Overlapping artifacts along the  abdomen. IMPRESSION: No acute cardiopulmonary disease Electronically Signed   By: Jill Side M.D.   On: 12/01/2022 19:12    ____________________________________________   PROCEDURES  Procedure(s) performed:   Procedures  None  ____________________________________________   INITIAL IMPRESSION / ASSESSMENT AND PLAN / ED COURSE  Pertinent labs & imaging results that were available during my care of the patient were reviewed by me and considered in my medical decision making (see chart for details).   This patient is Presenting for Evaluation of CP, which does require a range of treatment options, and is a complaint that involves a high  risk of morbidity and mortality.  The Differential Diagnoses includes but is not exclusive to acute coronary syndrome, aortic dissection, pulmonary embolism, cardiac tamponade, community-acquired pneumonia, pericarditis, musculoskeletal chest wall pain, etc.   I decided to review pertinent External Data, and in summary no prior ACS history or heart cath for review.   Clinical Laboratory Tests Ordered, included troponin negative x 2. No AKI.   Radiologic Tests Ordered, included CXR. I independently interpreted the images and agree with radiology interpretation.   Cardiac Monitor Tracing which shows NSR.   Social Determinants of Health Risk patient is a non-smoker.   Medical Decision Making: Summary:  Patient presents emergency department with sharp, left-sided chest pain.  Lower suspicion for ACS based on history.  She has some sinus arrhythmia but no acute ischemic change.  Considered PE and have added D-dimer given sharp quality.   Reevaluation with update and discussion with patient.  D-dimer slightly elevated which prompted a CTA of the chest.  No PE or other acute abnormality.  We discussed these results.  Patient has been referred to cardiology by her PCP.  She will confirm that appointment.  Discussed NSAIDs both PO and topical for possible MSK pain but also discussed strict ED return precautions.   Considered admission but workup is reassuring. Stable for d/c. Will keep appointment with outpatient Cardiology as referred by PCP.   Patient's presentation is most consistent with acute presentation with potential threat to life or bodily function.   Disposition: discharge  ____________________________________________  FINAL CLINICAL IMPRESSION(S) / ED DIAGNOSES  Final diagnoses:  Precordial chest pain    Note:  This document was prepared using Dragon voice recognition software and may include unintentional dictation errors.  Nanda Quinton, MD, [facility] Emergency Medicine     Eriyonna Matsushita, [facility], MD 12/02/22 [phone]

## 2022-12-02 ENCOUNTER — Telehealth: Payer: Self-pay

## 2022-12-02 ENCOUNTER — Emergency Department (HOSPITAL_COMMUNITY): Payer: 59

## 2022-12-02 ENCOUNTER — Telehealth: Payer: Self-pay | Admitting: Family Medicine

## 2022-12-02 LAB — D-DIMER, QUANTITATIVE: D-Dimer, Quant: 0.95 ug/mL-FEU — ABNORMAL HIGH (ref 0.00–0.50)

## 2022-12-02 MED ORDER — IOHEXOL 350 MG/ML SOLN
100.0000 mL | Freq: Once | INTRAVENOUS | Status: AC | PRN
  Administered 2022-12-02: 100 mL via INTRAVENOUS

## 2022-12-02 NOTE — Discharge Instructions (Signed)

## 2022-12-02 NOTE — Transitions of Care (Post Inpatient/ED Visit) (Unsigned)
   12/02/2022  Name: Janet [name] MRN: [id number] DOB: 12/15/1985  Today's TOC FU Call Status: Today's TOC FU Call Status:: Unsuccessul Call (1st Attempt) Unsuccessful Call (1st Attempt) Date: 12/02/22  Attempted to reach the patient regarding the most recent Inpatient/ED visit.  Follow Up Plan: Additional outreach attempts will be made to reach the patient to complete the Transitions of Care (Post Inpatient/ED visit) call.   Signature Juanda Crumble, [place] Direct Dial [phone]

## 2022-12-02 NOTE — Telephone Encounter (Signed)
Her appt is in 10 days.

## 2022-12-02 NOTE — Telephone Encounter (Signed)
Pt called stating she was in ER last night & states er wanted to see if we could get cardiology appt within 1 wk. Also he wanted her to follow up today with Dr. Moshe Cipro. Can nurse please contact patient?

## 2022-12-04 ENCOUNTER — Encounter: Payer: Self-pay | Admitting: Family Medicine

## 2022-12-04 DIAGNOSIS — R079 Chest pain, unspecified: Secondary | ICD-10-CM | POA: Insufficient documentation

## 2022-12-04 DIAGNOSIS — R011 Cardiac murmur, unspecified: Secondary | ICD-10-CM | POA: Insufficient documentation

## 2022-12-04 DIAGNOSIS — R002 Palpitations: Secondary | ICD-10-CM | POA: Insufficient documentation

## 2022-12-04 NOTE — Assessment & Plan Note (Signed)
Reports bein told that she had a heart  murmur, refer cardiology fo full eval

## 2022-12-04 NOTE — Assessment & Plan Note (Signed)
  Patient re-educated about  the importance of commitment to a  minimum of 150 minutes of exercise per week as able.  The importance of healthy food choices with portion control discussed, as well as eating regularly and within a 12 hour window most days. The need to choose "clean , green" food 50 to 75% of the time is discussed, as well as to make water the primary drink and set a goal of 64 ounces water daily.       12/01/2022    6:51 PM 11/30/2022    2:53 PM 11/29/2022    8:19 AM  Weight /BMI  Weight 185 lb 186 lb 184 lb 1.6 oz  Height 5' 4"$  (1.626 m) 5' 4"$  (1.626 m) 5' 4"$  (1.626 m)  BMI 31.76 kg/m2 31.93 kg/m2 31.6 kg/m2

## 2022-12-04 NOTE — Assessment & Plan Note (Signed)
DASH diet and commitment to daily physical activity for a minimum of 30 minutes discussed and encouraged, as a part of hypertension management. The importance of attaining a healthy weight is also discussed.     12/02/2022    1:00 AM 12/01/2022   10:15 PM 12/01/2022    6:51 PM 12/01/2022    6:50 PM 11/30/2022    3:27 PM 11/30/2022    2:53 PM 11/29/2022    8:19 AM  BP/Weight  Systolic BP [id number]  A999333 0000000 0000000   Diastolic BP 63 99991111  96 92 63   Wt. (Lbs)   185   186 184.1  BMI   31.76 kg/m2   31.93 kg/m2 31.6 kg/m2     Lisinopril 10 mg prescribed

## 2022-12-04 NOTE — Assessment & Plan Note (Signed)
Needs cardiolgy evaluation base don recurrent symptom severity EKG negative for acute ischemia, bradycardia  noted

## 2022-12-04 NOTE — Assessment & Plan Note (Signed)
Reports recurrent palpitations , not accompanied with symptoms of panic/ anxiety, has bradycrdia on EKG , cardiolgy eval

## 2022-12-05 NOTE — Transitions of Care (Post Inpatient/ED Visit) (Unsigned)
   12/05/2022  Name: Janet [name] MRN: [id number] DOB: [birth year]  Today's TOC FU Call Status: Today's TOC FU Call Status:: Unsuccessful Call (2nd Attempt) Unsuccessful Call (1st Attempt) Date: 12/02/22 Unsuccessful Call (2nd Attempt) Date: 12/05/22  Attempted to reach the patient regarding the most recent Inpatient/ED visit.  Follow Up Plan: Additional outreach attempts will be made to reach the patient to complete the Transitions of Care (Post Inpatient/ED visit) call.   Signature Juanda Crumble, [place] Direct Dial [phone]

## 2022-12-06 NOTE — Transitions of Care (Post Inpatient/ED Visit) (Signed)
   12/06/2022  Name: Janet [name] MRN: [id number] DOB: Jan 31, 1986  Today's TOC FU Call Status: Today's TOC FU Call Status:: Unsuccessful Call (3rd Attempt) Unsuccessful Call (1st Attempt) Date: 12/02/22 Unsuccessful Call (2nd Attempt) Date: 12/05/22 Unsuccessful Call (3rd Attempt) Date: 12/06/22  Attempted to reach the patient regarding the most recent Inpatient/ED visit.  Follow Up Plan: No further outreach attempts will be made at this time. We have been unable to contact the patient.  Signature Juanda Crumble, [place] Direct Dial [phone]

## 2022-12-08 ENCOUNTER — Encounter: Payer: Self-pay | Admitting: Cardiology

## 2022-12-08 NOTE — Progress Notes (Signed)
Cardiology Office Note   Date:  12/09/2022   ID:  Janet [name], DOB May 27, 1986, MRN [id number]  PCP:  [name], MD  Cardiologist:   None Referring:  [name], MD  Chief Complaint  Patient presents with   Chest Pain      History of Present Illness: Janet [name] is a 37 y.o. female who is referred by [name], MD for evaluation of chest pain.   She was in the hospital last month for this.  I reviewed these records for this visit.    She also has slow heart rate.  I do see an EKG in the demonstrates sinus arrhythmia with sinus bradycardia.  She was in the emergency room and I did review these records.  This was for chest discomfort.  This has been happening for about 2 weeks prior to presentation.  The day that it happened took her to the emergency room and it was severe when she was sitting waiting for her daughters.  Sharp.  It is midsternal.  There was radiation to the left shoulder.  It went down her left arm and actually to the left top of her foot with numbness.  She drove self to the emergency room.  She had a mildly elevated D-dimer there.  CT however demonstrated no pulmonary embolism no other acute abnormalities.  She has not had other prior cardiac history.  She exercises 5 times a week at the gym and can do cardio without bringing on any symptoms.  She has not had any presyncope or syncope.  He has not had any new shortness of breath, PND or orthopnea.  She thinks she has had some prior evaluation.  Her daughter and pulmonary valve stenosis and patient herself was told that she had a murmur years ago.   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   Gallbladder problem    Gastroesophageal  reflux disease    GERD (gastroesophageal reflux disease)    Phreesia 08/12/2020   Hematuria 09/18/2015   Hemorrhoids    History of Chiari malformation    Hypertension    Phreesia 08/12/2020   Internal hemorrhoids with other complication [id number]   JAN 2015 R ANT MAR 2015 R POS AND L LAT BAND    Menstrual periods irregular 09/18/2015   Migraine without aura, with intractable migraine, so stated, without mention of status migrainosus 11/11/2013   Plantar fasciitis    Prediabetes    Preeclampsia 05/21/2019   2x/wk testing nst alt w/ bpp/dopp      Deliver @ 37wks        IOL scheduled for 8/19 @ 0830   Sleep apnea    Vitamin D deficiency     Past Surgical History:  Procedure Laterality Date   arthroscopic treatment of osteochondral defect of right ankle  03/2021   Bilateral plantar fascial [place]  03/2021   CHOLECYSTECTOMY  02/2015   COLONOSCOPY N/A 04/19/2013   [id number] mucosa in the terminal ileum/Single erosion in transverse colon/RECTAL BLEEDING DUE TO Moderate sized internal hemorrhoids/ trv colon erosion, bx benign.   COLONOSCOPY WITH PROPOFOL N/A 08/31/2020   Non-bleeding internal hemorrhoids. Colon torturous.  ESOPHAGOGASTRODUODENOSCOPY N/A 04/19/2013   [id number] Non-erosive gastritis/PERI-UMBILICAL PAIN DUE TO GERD, GASTRITIS, AND CONSTIPATION. Bx with mild chronic inactive gastritis. No H.Pylori   GASTRIC ROUX-EN-Y N/A 06/21/2022   Procedure: LAPAROSCOPIC ROUX-EN-Y GASTRIC BYPASS WITH UPPER ENDOSCOPY WITH HIATAL HERNIA REPAIR;  Surgeon: [name], MD;  Location: [facility];  Service: General;  Laterality: N/A;   HEMORRHOID BANDING  2015   Dr. Gala Romney- in office banding procedure   LAPAROSCOPIC ADRENALECTOMY  10/28/2010   TONSILLECTOMY     tumor removed left adrenal gland 01/12       Current Outpatient Medications  Medication Sig Dispense Refill   acetaZOLAMIDE (DIAMOX) 250 MG tablet Take 1/2 pill in the morning and 1 pill at bedtime. If side effects can take all in the  evening. Also try to get to 2 pills a day preferably one pill twice daily but could be all at night if can't take pill in day due to side effects. 180 tablet 4   Calcium Carb-Cholecalciferol 500-10 MG-MCG TABS Take 1 tablet by mouth in the morning, at noon, and at bedtime.     furosemide (LASIX) 20 MG tablet Take 1 tablet (20 mg total) by mouth daily. 30 tablet 2   Multiple Vitamin (MULTIVITAMIN) tablet Take 1 tablet by mouth daily.     pantoprazole (PROTONIX) 40 MG tablet TAKE 1 TABLET BY MOUTH ONCE daily AS NEEDED FOR heartburn 30 tablet 6   prenatal vitamin w/FE, FA (PRENATAL 1 + 1) 27-1 MG TABS tablet Take 1 tablet by mouth daily at 12 noon. 30 tablet 12   Vitamin D, Ergocalciferol, (DRISDOL) 1.25 MG (50000 UNIT) CAPS capsule TAKE ONE CAPSULE BY MOUTH ONCE WEEKLY ON WEDNESDAY AND ONCE WEEKLY ON SUNDAY 8 capsule 6   lisinopril (ZESTRIL) 10 MG tablet Take 1 tablet (10 mg total) by mouth daily. (Patient not taking: Reported on 12/09/2022) 30 tablet 3   No current facility-administered medications for this visit.   Facility-Administered Medications Ordered in Other Visits  Medication Dose Route Frequency Provider Last Rate Last Admin   ondansetron (ZOFRAN-ODT) disintegrating tablet 4 mg  4 mg Oral Q4H PRN Stechschulte, Nickola Major, MD       Or   ondansetron (ZOFRAN) 4 mg in sodium chloride 0.9 % 50 mL IVPB  4 mg Intravenous Q4H PRN Stechschulte, Nickola Major, MD       sodium chloride 0.9 % 1,000 mL with thiamine [id number] mg, folic acid 1 mg, M.V.I. Adult 10 mL infusion   Intravenous Once Stechschulte, Nickola Major, MD       sodium chloride 0.9 % bolus 1,000 mL  1,000 mL Intravenous Once Stechschulte, Nickola Major, MD        Allergies:   Penicillins, Wound dressing adhesive, Latex, and Neomycin    Social History:  The patient  reports that she has never smoked. She has never used smokeless tobacco. She reports that she does not drink alcohol and does not use drugs.   Family History:  The patient's family history includes  COPD in her paternal grandfather; Colon cancer in an other family member; Colon polyps in her father and mother; Deep vein thrombosis in her maternal grandmother and sister; Diabetes in her maternal grandmother and paternal grandmother; Heart disease in her paternal grandfather; Hyperlipidemia in her mother; Hypertension in her father, maternal grandmother, and mother; Migraines in her maternal aunt; Obesity in her father; Other in her daughter; Sleep apnea in her father; Stroke in her maternal aunt.    ROS:  Please see the  history of present illness.   Otherwise, review of systems are positive for none.   All other systems are reviewed and negative.    PHYSICAL EXAM: VS:  BP 124/78   Pulse 63   Ht '5\' 4"'$  (1.626 m)   Wt 185 lb 3.2 oz (84 kg)   LMP 11/21/2022   SpO2 99%   BMI 31.79 kg/m  , BMI Body mass index is 31.79 kg/m. GENERAL:  Well appearing HEENT:  Pupils equal round and reactive, fundi not visualized, oral mucosa unremarkable NECK:  No jugular venous distention, waveform within normal limits, carotid upstroke brisk and symmetric, no bruits, no thyromegaly LYMPHATICS:  No cervical, inguinal adenopathy LUNGS:  Clear to auscultation bilaterally BACK:  No CVA tenderness CHEST:  Unremarkable HEART:  PMI not displaced or sustained,S1 and S2 within normal limits, no S3, no S4, no clicks, no rubs, no murmurs ABD:  Flat, positive bowel sounds normal in frequency in pitch, no bruits, no rebound, no guarding, no midline pulsatile mass, no hepatomegaly, no splenomegaly EXT:  2 plus pulses throughout, no edema, no cyanosis no clubbing SKIN:  No rashes no nodules NEURO:  Cranial nerves II through XII grossly intact, motor grossly intact throughout PSYCH:  Cognitively intact, oriented to person place and time    EKG:  EKG is ordered today. The ekg ordered today demonstrates sinus rhythm, rate 63, axis within normal limits, intervals within normal limits, no acute ST-T wave  changes.   Recent Labs: 08/02/2022: Magnesium 2.2; TSH 0.858 10/13/2022: ALT 11 12/01/2022: BUN 7; Creatinine, Ser 0.72; Hemoglobin 11.6; Platelets 366; Potassium 3.3; Sodium 139    Lipid Panel    Component Value Date/Time   CHOL 138 08/02/2022 1637   TRIG 94 08/02/2022 1637   HDL 41 08/02/2022 1637   CHOLHDL 3.4 08/02/2022 1637   CHOLHDL 3.1 03/20/2020 0703   VLDL 9 02/27/2013 1140   LDLCALC 79 08/02/2022 1637   LDLCALC 99 03/20/2020 0703      Wt Readings from Last 3 Encounters:  12/09/22 185 lb 3.2 oz (84 kg)  12/01/22 185 lb (83.9 kg)  11/30/22 186 lb (84.4 kg)      Other studies Reviewed: Additional studies/ records that were reviewed today include:  ED records. Review of the above records demonstrates:  Please see elsewhere in the note.     ASSESSMENT AND PLAN:  Chest pain:   The Chest discomfort has nonanginal greater than anginal features.  I think the pretest probability of obstructive coronary disease is very low.  I will bring the patient back for a POET (Plain Old Exercise Test). This will allow me to screen for obstructive coronary disease, risk stratify and very importantly provide a prescription for exercise.  Bradycardia: Patient has sinus bradycardia and sinus arrhythmia.  I do not suspect any symptomatic dysrhythmias and would not suggest further cardiac workup.  I do note that she had normal electrolytes and normal thyroid.   Current medicines are reviewed at length with the patient today.  The patient does not have concerns regarding medicines.  The following changes have been made:  no change  Labs/ tests ordered today include: None  Orders Placed This Encounter  Procedures   Exercise Tolerance Test   EKG 12-Lead     Disposition:   FU with me as needed      Signed, Minus Breeding, MD  12/09/2022 12:01 PM    [place]

## 2022-12-09 ENCOUNTER — Encounter: Payer: Self-pay | Admitting: Cardiology

## 2022-12-09 ENCOUNTER — Ambulatory Visit: Payer: 59 | Attending: Cardiology | Admitting: Cardiology

## 2022-12-09 VITALS — BP 124/78 | HR 63 | Ht 64.0 in | Wt 185.2 lb

## 2022-12-09 DIAGNOSIS — R072 Precordial pain: Secondary | ICD-10-CM

## 2022-12-09 NOTE — Patient Instructions (Signed)
  Testing/Procedures:   Your physician has requested that you have an exercise tolerance test. For further information please visit [email or web address]. Please also follow instruction sheet, as given. [place], you and your health needs are our priority.  As part of our continuing mission to provide you with exceptional heart care, we have created designated Provider Care Teams.  These Care Teams include your primary Cardiologist (physician) and Advanced Practice Providers (APPs -  Physician Assistants and Nurse Practitioners) who all work together to provide you with the care you need, when you need it.  We recommend signing up for the patient portal called "MyChart".  Sign up information is provided on this After Visit Summary.  MyChart is used to connect with patients for Virtual Visits (Telemedicine).  Patients are able to view lab/test results, encounter notes, upcoming appointments, etc.  Non-urgent messages can be sent to your provider as well.   To learn more about what you can do with MyChart, go to [email or web address].    Your next appointment:    AS NEEDED

## 2022-12-12 ENCOUNTER — Ambulatory Visit (HOSPITAL_BASED_OUTPATIENT_CLINIC_OR_DEPARTMENT_OTHER): Payer: 59 | Admitting: Cardiology

## 2022-12-17 LAB — BMP8+EGFR
BUN/Creatinine Ratio: 14 (ref 9–23)
BUN: 11 mg/dL (ref 6–20)
CO2: 20 mmol/L (ref 20–29)
Calcium: 8.6 mg/dL — ABNORMAL LOW (ref 8.7–10.2)
Chloride: 108 mmol/L — ABNORMAL HIGH (ref 96–106)
Creatinine, Ser: 0.78 mg/dL (ref 0.57–1.00)
Glucose: 93 mg/dL (ref 70–99)
Potassium: 4.2 mmol/L (ref 3.5–5.2)
Sodium: 139 mmol/L (ref 134–144)
eGFR: 100 mL/min/{1.73_m2} (ref >59)

## 2022-12-29 ENCOUNTER — Telehealth (HOSPITAL_COMMUNITY): Payer: Self-pay | Admitting: *Deleted

## 2022-12-29 NOTE — Telephone Encounter (Signed)
Called patient to remind her of her ETT on 01/02/23 at 3:30 and gave instructions.

## 2023-01-02 ENCOUNTER — Ambulatory Visit (HOSPITAL_COMMUNITY): Payer: 59 | Attending: Cardiology

## 2023-01-02 DIAGNOSIS — R072 Precordial pain: Secondary | ICD-10-CM | POA: Diagnosis not present

## 2023-01-02 LAB — EXERCISE TOLERANCE TEST
Angina Index: 1
Duke Treadmill Score: 5
Estimated workload: 10.8
Exercise duration (min): 9 min
Exercise duration (sec): 27 s
MPHR: 183 {beats}/min
Peak HR: 173 {beats}/min
Percent HR: 94 %
Rest HR: 69 {beats}/min
ST Depression (mm): 0 mm

## 2023-01-18 ENCOUNTER — Ambulatory Visit: Payer: 59 | Admitting: Skilled Nursing Facility1

## 2023-01-24 ENCOUNTER — Encounter: Payer: Self-pay | Admitting: Neurology

## 2023-02-01 ENCOUNTER — Ambulatory Visit: Payer: 59 | Admitting: Family Medicine

## 2023-02-01 ENCOUNTER — Ambulatory Visit: Payer: 59 | Admitting: Neurology

## 2023-02-01 VITALS — BP 130/84 | HR 54 | Resp 16 | Ht 64.0 in | Wt 183.0 lb

## 2023-02-01 DIAGNOSIS — K219 Gastro-esophageal reflux disease without esophagitis: Secondary | ICD-10-CM | POA: Diagnosis not present

## 2023-02-01 DIAGNOSIS — M958 Other specified acquired deformities of musculoskeletal system: Secondary | ICD-10-CM | POA: Diagnosis not present

## 2023-02-01 DIAGNOSIS — G43009 Migraine without aura, not intractable, without status migrainosus: Secondary | ICD-10-CM

## 2023-02-01 DIAGNOSIS — E669 Obesity, unspecified: Secondary | ICD-10-CM

## 2023-02-01 DIAGNOSIS — G935 Compression of brain: Secondary | ICD-10-CM

## 2023-02-01 DIAGNOSIS — E559 Vitamin D deficiency, unspecified: Secondary | ICD-10-CM

## 2023-02-01 NOTE — Progress Notes (Signed)
   Janet [name]     MRN: [id number]      DOB: 1986/02/13   HPI Janet [name] is here for follow up and re-evaluation of chronic medical conditions, medication management and review of any available recent lab and radiology data.  Preventive health is updated, specifically  Cancer screening and Immunization.   Right ankle no longer [painful and swollen and has no plan to pursue surgery that seemed necessary 1 year ago, when she was 70 pounds heavier Now able to eat small amts without disabling nausea. Weight is stable, will start exercise routine gradually Continues with daily posterior headache, was intolerant of diamox and will need to have surgery to relieve intracranial pressure threatening vision and causing pain , hesitant about this but will follow through if her only option Feels much better with weight loss Taking vitamins inc biotin for hair loss daily, lasix on avg twice weekly and not taking diamox or lisinopril  ROS Denies recent fever or chills. Denies sinus pressure, nasal congestion, ear pain or sore throat. Denies chest congestion, productive cough or wheezing. Denies chest pains, palpitations and leg swelling Denies abdominal pain,g,diarrhea or constipation.   Denies dysuria, frequency, hesitancy or incontinence. Denies joint pain, swelling and limitation in mobility. Denies depression, anxiety or insomnia. Denies skin break down or rash.c/o hair loss and thinning   PE  BP 130/84   Pulse (!) 54   Resp 16   Ht  (1.626 m)   Wt 183 lb (83 kg)   SpO2 98%   BMI 31.41 kg/m   Patient alert and oriented and in no cardiopulmonary distress.  HEENT: No facial asymmetry, EOMI,     Neck supple .  Chest: Clear to auscultation bilaterally.  CVS: S1, S2 no murmurs, no S3.Regular rate.  ABD: Soft non tender.   Ext: No edema  MS: Adequate ROM spine, shoulders, hips and knees.  Skin: Intact, no ulcerations or rash noted.  Psych: Good eye contact, normal affect.  Memory intact not anxious or depressed appearing.  CNS: CN 2-12 intact, power,  normal throughout.no focal deficits noted.   Assessment & Plan  Common migraine Less frequent , headaches are mainly posterior and related to raised ICP  Gastroesophageal reflux disease Relies on daily PPI for symptom control/ relief  Osteochondral defect of talus Asymptomatic as far as pain is concerned, unlikely to pursue surgery which once seemed necessary  Chiari malformation type I [facility]) Surgery proposed for definitive management  Vitamin D deficiency Continue supplement  Obesity (BMI 30.0-34.9)  Patient re-educated about  the importance of commitment to a  minimum of 150 minutes of exercise per week as able.  The importance of healthy food choices with portion control discussed, as well as eating regularly and within a 12 hour window most days. The need to choose "clean , green" food 50 to 75% of the time is discussed, as well as to make water the primary drink and set a goal of 64 ounces water daily.       02/01/2023    3:11 PM 12/09/2022   10:53 AM 12/01/2022    6:51 PM  Weight /BMI  Weight 183 lb 185 lb 3.2 oz 185 lb  Height  (1.626 m)  (1.626 m)  (1.626 m)  BMI 31.41 kg/m2 31.79 kg/m2 31.76 kg/m2    Excellent response to surgery, gradual ongoing weight loss proposed

## 2023-02-01 NOTE — Assessment & Plan Note (Signed)
Relies on daily PPI for symptom control/ relief

## 2023-02-01 NOTE — Assessment & Plan Note (Signed)
  Patient re-educated about  the importance of commitment to a  minimum of 150 minutes of exercise per week as able.  The importance of healthy food choices with portion control discussed, as well as eating regularly and within a 12 hour window most days. The need to choose "clean , green" food 50 to 75% of the time is discussed, as well as to make water the primary drink and set a goal of 64 ounces water daily.       02/01/2023    3:11 PM 12/09/2022   10:53 AM 12/01/2022    6:51 PM  Weight /BMI  Weight 183 lb 185 lb 3.2 oz 185 lb  Height  (1.626 m)  (1.626 m)  (1.626 m)  BMI 31.41 kg/m2 31.79 kg/m2 31.76 kg/m2    Excellent response to surgery, gradual ongoing weight loss proposed

## 2023-02-01 NOTE — Assessment & Plan Note (Signed)
Asymptomatic as far as pain is concerned, unlikely to pursue surgery which once seemed necessary

## 2023-02-01 NOTE — Assessment & Plan Note (Signed)
Less frequent , headaches are mainly posterior and related to raised ICP

## 2023-02-01 NOTE — Assessment & Plan Note (Signed)
Continue supplement. 

## 2023-02-01 NOTE — Assessment & Plan Note (Signed)
Surgery proposed for definitive management

## 2023-02-01 NOTE — Patient Instructions (Addendum)
F/U in 5 months, call if you need me sooner  Thankful ankle is 100 % better  Thankful re 70 pound weight loss and now being able to  eat and maintain your weight  Surgery for the raised pressure in your brain is necessary,  and I recommend you go through with this if it cannot be corrected any other way  Thanks for choosing [facility], we consider it a privelige to serve you.

## 2023-02-07 ENCOUNTER — Other Ambulatory Visit (HOSPITAL_COMMUNITY): Payer: Self-pay | Admitting: Surgery

## 2023-02-07 DIAGNOSIS — D134 Benign neoplasm of liver: Secondary | ICD-10-CM

## 2023-02-07 DIAGNOSIS — K7689 Other specified diseases of liver: Secondary | ICD-10-CM

## 2023-02-08 ENCOUNTER — Telehealth: Payer: Self-pay | Admitting: Family Medicine

## 2023-02-08 NOTE — Telephone Encounter (Signed)
Pt wants a call back from Merry Proud has a medication questions.

## 2023-02-10 ENCOUNTER — Other Ambulatory Visit: Payer: Self-pay

## 2023-02-10 MED ORDER — MINOXIDIL 10 MG PO TABS: 10.0000 mg | ORAL_TABLET | Freq: Every day | ORAL | 2 refills | Status: DC

## 2023-02-10 MED ORDER — CLOTRIMAZOLE-BETAMETHASONE 1-0.05 % EX CREA: 1.0000 | TOPICAL_CREAM | Freq: Two times a day (BID) | CUTANEOUS | 1 refills | Status: DC

## 2023-02-10 NOTE — Telephone Encounter (Signed)
LMTRC-KG 

## 2023-02-10 NOTE — Telephone Encounter (Signed)
Patient returning call.

## 2023-02-10 NOTE — Addendum Note (Signed)
Addended by: Kerri Perches on: 02/10/2023 03:49 PM   Modules accepted: Orders

## 2023-02-16 ENCOUNTER — Other Ambulatory Visit: Payer: Self-pay | Admitting: General Surgery

## 2023-02-17 ENCOUNTER — Ambulatory Visit
Admission: RE | Admit: 2023-02-17 | Discharge: 2023-02-17 | Disposition: A | Discharge status: Home / Self Care | Payer: 59 | Source: Ambulatory Visit | Attending: General Surgery | Admitting: General Surgery

## 2023-02-17 ENCOUNTER — Ambulatory Visit (HOSPITAL_COMMUNITY)
Admission: RE | Admit: 2023-02-17 | Discharge: 2023-02-17 | Disposition: A | Discharge status: Home / Self Care | Payer: 59 | Source: Ambulatory Visit | Attending: Surgery | Admitting: Surgery

## 2023-02-17 DIAGNOSIS — E860 Dehydration: Secondary | ICD-10-CM | POA: Diagnosis present

## 2023-02-17 DIAGNOSIS — D134 Benign neoplasm of liver: Secondary | ICD-10-CM | POA: Diagnosis present

## 2023-02-17 DIAGNOSIS — K7689 Other specified diseases of liver: Secondary | ICD-10-CM | POA: Diagnosis present

## 2023-02-17 MED ORDER — GADOXETATE DISODIUM 0.25 MMOL/ML IV SOLN
8.0000 mL | Freq: Once | INTRAVENOUS | Status: AC | PRN
  Administered 2023-02-17: 8 mL via INTRAVENOUS

## 2023-02-17 MED ORDER — SODIUM CHLORIDE 0.9 % IV BOLUS
2000.0000 mL | Freq: Once | INTRAVENOUS | Status: AC
  Administered 2023-02-17: 2000 mL via INTRAVENOUS

## 2023-04-03 ENCOUNTER — Telehealth: Payer: Self-pay

## 2023-04-03 NOTE — Transitions of Care (Post Inpatient/ED Visit) (Unsigned)
   04/03/2023  Name: Janet [name] MRN: [id number] DOB: Aug 18, 1986  Today's TOC FU Call Status: Today's TOC FU Call Status:: Unsuccessul Call (1st Attempt) Unsuccessful Call (1st Attempt) Date: 04/03/23  Attempted to reach the patient regarding the most recent Inpatient/ED visit.  Follow Up Plan: Additional outreach attempts will be made to reach the patient to complete the Transitions of Care (Post Inpatient/ED visit) call.   Signature Karena Addison, LPN [facility] Nurse Health Advisor Direct Dial [phone]

## 2023-04-04 NOTE — Transitions of Care (Post Inpatient/ED Visit) (Signed)
04/04/2023  Name: Janet [name] MRN: [id number] DOB: [birth year]  Today's TOC FU Call Status: Today's TOC FU Call Status:: Successful TOC FU Call Competed Unsuccessful Call (1st Attempt) Date: 04/03/23 [facility] FU Call Complete Date: 04/04/23  Transition Care Management Follow-up Telephone Call Date of Discharge: 03/31/23 Discharge Facility: Other ([facility]) Name of Other ([facility]) Discharge Facility: [facility] Type of Discharge: Inpatient Admission Primary Inpatient Discharge Diagnosis:: compression of brain How have you been since you were released from the hospital?: Better Any questions or concerns?: No  Items Reviewed: Did you receive and understand the discharge instructions provided?: Yes Medications obtained,verified, and reconciled?: Yes (Medications Reviewed) Any new allergies since your discharge?: No Dietary orders reviewed?: Yes Do you have support at home?: Yes People in Home: parent(s)  Medications Reviewed Today: Medications Reviewed Today     Reviewed by Karena Addison, LPN (Licensed Practical Nurse) on 04/04/23 at 1633  Med List Status: <None>   Medication Order Taking? Sig Documenting Provider Last Dose Status Informant  acetaZOLAMIDE (DIAMOX) 250 MG tablet [id number] No Take 1/2 pill in the morning and 1 pill at bedtime. If side effects can take all in the evening. Also try to get to 2 pills a day preferably one pill twice daily but could be all at night if can't take pill in day due to side effects. Anson Fret, MD Taking Active            Med Note Lodema Hong, Milus Mallick   Wed Nov 30, 2022  3:16 PM) Takes only one at bedtime  Calcium Carb-Cholecalciferol 500-10 MG-MCG TABS [id number] No Take 1 tablet by mouth in the morning, at noon, and at bedtime. [provider] Taking Active   clotrimazole-betamethasone Thurmond Butts) cream [id number]  Apply 1 Application topically 2 (two) times daily. Kerri Perches, MD  Active   furosemide  (LASIX) 20 MG tablet [id number] No Take 1 tablet (20 mg total) by mouth daily. Anson Fret, MD Taking Active   lisinopril (ZESTRIL) 10 MG tablet [id number] No Take 1 tablet (10 mg total) by mouth daily.  Patient not taking: Reported on 12/09/2022   Kerri Perches, MD Not Taking Active   minoxidil (LONITEN) 10 MG tablet [id number]  Take 1 tablet (10 mg total) by mouth daily. Kerri Perches, MD  Active   Multiple Vitamin (MULTIVITAMIN) tablet [id number] No Take 1 tablet by mouth daily. [provider] Taking Active   pantoprazole (PROTONIX) 40 MG tablet [id number] No TAKE 1 TABLET BY MOUTH ONCE daily AS NEEDED FOR heartburn Kerri Perches, MD Taking Active   prenatal vitamin w/FE, FA (PRENATAL 1 + 1) 27-1 MG TABS tablet [id number] No Take 1 tablet by mouth daily at 12 noon. Adline Potter, NP Taking Active   Vitamin D, Ergocalciferol, (DRISDOL) 1.25 MG (50000 UNIT) CAPS capsule [id number] No TAKE ONE CAPSULE BY MOUTH ONCE WEEKLY ON WEDNESDAY AND ONCE WEEKLY ON SUNDAY Kerri Perches, MD Taking Active             Home Care and Equipment/Supplies: Were Home Health Services Ordered?: NA Any new equipment or medical supplies ordered?: NA  Functional Questionnaire: Do you need assistance with bathing/showering or dressing?: Yes Do you need assistance with meal preparation?: Yes Do you need assistance with eating?: No Do you have difficulty maintaining continence: No Do you need assistance with getting out of bed/getting out of a chair/moving?: Yes Do you have difficulty managing or taking your medications?: No  Follow up appointments  reviewed: PCP Follow-up appointment confirmed?: NA Specialist Hospital Follow-up appointment confirmed?: Yes Date of Specialist follow-up appointment?: 04/10/23 Follow-Up Specialty Provider:: [facility] Do you need transportation to your follow-up appointment?: No Do you understand care options if your condition(s) worsen?:  Yes-patient verbalized understanding    SIGNATURE Karena Addison, LPN [facility] Nurse Health Advisor Direct Dial [phone]

## 2023-04-05 ENCOUNTER — Ambulatory Visit: Payer: 59 | Admitting: Neurology

## 2023-05-27 ENCOUNTER — Emergency Department (HOSPITAL_COMMUNITY)
Admission: EM | Admit: 2023-05-27 | Discharge: 2023-05-27 | Disposition: A | Discharge status: Home / Self Care | Payer: 59 | Attending: Emergency Medicine | Admitting: Emergency Medicine

## 2023-05-27 ENCOUNTER — Encounter (HOSPITAL_COMMUNITY): Payer: Self-pay | Admitting: *Deleted

## 2023-05-27 ENCOUNTER — Emergency Department (HOSPITAL_COMMUNITY): Payer: 59

## 2023-05-27 ENCOUNTER — Other Ambulatory Visit: Payer: Self-pay

## 2023-05-27 DIAGNOSIS — R519 Headache, unspecified: Secondary | ICD-10-CM | POA: Insufficient documentation

## 2023-05-27 DIAGNOSIS — Z8669 Personal history of other diseases of the nervous system and sense organs: Secondary | ICD-10-CM | POA: Insufficient documentation

## 2023-05-27 DIAGNOSIS — Z91040 Latex allergy status: Secondary | ICD-10-CM | POA: Diagnosis not present

## 2023-05-27 LAB — CBC WITH DIFFERENTIAL/PLATELET
Abs Immature Granulocytes: 0.01 10*3/uL (ref 0.00–0.07)
Basophils Absolute: 0 10*3/uL (ref 0.0–0.1)
Basophils Relative: 1 %
Eosinophils Absolute: 0.1 10*3/uL (ref 0.0–0.5)
Eosinophils Relative: 1 %
HCT: 35.9 % — ABNORMAL LOW (ref 36.0–46.0)
Hemoglobin: 11.8 g/dL — ABNORMAL LOW (ref 12.0–15.0)
Immature Granulocytes: 0 %
Lymphocytes Relative: 32 %
Lymphs Abs: 2 10*3/uL (ref 0.7–4.0)
MCH: 28 pg (ref 26.0–34.0)
MCHC: 32.9 g/dL (ref 30.0–36.0)
MCV: 85.1 fL (ref 80.0–100.0)
Monocytes Absolute: 0.4 10*3/uL (ref 0.1–1.0)
Monocytes Relative: 7 %
Neutro Abs: 3.9 10*3/uL (ref 1.7–7.7)
Neutrophils Relative %: 59 %
Platelets: 367 10*3/uL (ref 150–400)
RBC: 4.22 MIL/uL (ref 3.87–5.11)
RDW: 14.1 % (ref 11.5–15.5)
WBC: 6.5 10*3/uL (ref 4.0–10.5)
nRBC: 0 % (ref 0.0–0.2)

## 2023-05-27 LAB — BASIC METABOLIC PANEL
Anion gap: 8 (ref 5–15)
BUN: 8 mg/dL (ref 6–20)
CO2: 24 mmol/L (ref 22–32)
Calcium: 8.5 mg/dL — ABNORMAL LOW (ref 8.9–10.3)
Chloride: 106 mmol/L (ref 98–111)
Creatinine, Ser: 0.67 mg/dL (ref 0.44–1.00)
GFR, Estimated: >60 mL/min (ref >60)
Glucose, Bld: 103 mg/dL — ABNORMAL HIGH (ref 70–99)
Potassium: 4.1 mmol/L (ref 3.5–5.1)
Sodium: 138 mmol/L (ref 135–145)

## 2023-05-27 LAB — HCG, QUANTITATIVE, PREGNANCY: hCG, Beta Chain, Quant, S: <1 m[IU]/mL (ref <5)

## 2023-05-27 LAB — MAGNESIUM: Magnesium: 2 mg/dL (ref 1.7–2.4)

## 2023-05-27 MED ORDER — DIPHENHYDRAMINE HCL 50 MG/ML IJ SOLN
25.0000 mg | Freq: Once | INTRAMUSCULAR | Status: AC
  Administered 2023-05-27: 25 mg via INTRAVENOUS
  Filled 2023-05-27: qty 1

## 2023-05-27 MED ORDER — MAGNESIUM SULFATE 2 GM/50ML IV SOLN
2.0000 g | Freq: Once | INTRAVENOUS | Status: AC
  Administered 2023-05-27: 2 g via INTRAVENOUS
  Filled 2023-05-27: qty 50

## 2023-05-27 MED ORDER — OXYCODONE HCL 5 MG PO TABS
5.0000 mg | ORAL_TABLET | Freq: Once | ORAL | Status: AC
  Administered 2023-05-27: 5 mg via ORAL
  Filled 2023-05-27: qty 1

## 2023-05-27 MED ORDER — ACETAMINOPHEN 500 MG PO TABS
1000.0000 mg | ORAL_TABLET | Freq: Once | ORAL | Status: AC
  Administered 2023-05-27: 1000 mg via ORAL
  Filled 2023-05-27: qty 2

## 2023-05-27 MED ORDER — PROCHLORPERAZINE EDISYLATE 10 MG/2ML IJ SOLN
10.0000 mg | Freq: Once | INTRAMUSCULAR | Status: AC
  Administered 2023-05-27: 10 mg via INTRAVENOUS
  Filled 2023-05-27: qty 2

## 2023-05-27 NOTE — ED Provider Notes (Signed)
[place] EMERGENCY DEPARTMENT AT [facility] Provider Note   CSN: [id number] Arrival date & time: 05/27/23  1557     History  Chief Complaint  Patient presents with   Headache    Janet [name] is a 37 y.o. female with Chiari malformation type I, Cushing's syndrome, headache suspected idiopathic intracranial hypertension who presents with headache.  Per chart review patient had opted to pursue posterior fossa decompression for Chiari malformation with [facility] neurosurgery on 03/28/23.  Postoperatively she had rocky course with significant pain and nausea and headache.  LP was performed in July with demonstrated opening pressure of 24 cm of water.  Had improvement of headaches for about 1 day after LP but unfortunately headaches and nausea returned.  Started on Diamox 250 mg daily.  On 05/15/23 patient was seen by neurosurgery in clinic reporting different headaches.  She was uncertain if she could tolerate higher doses of Diamox due to diarrhea.  Was started on magnesium supplements for headaches and planned for follow-up with neurosurgery again in a couple of weeks.    Today patient presents w/ pressure to head located mostly in the occiput area.  HA's are the same as before.  She states they got worse again after the surgery improved a little bit with the LP but have come back.  Worse with bending over, lying down, Valsalva.  Associated with intermittent spots in the vision, same as before.  Denies any numbness tingling, asymmetric weakness, LOC, head trauma, neck pain.  She states she has an appointment and follow-up with neurosurgery on Monday to discuss ongoing treatment and possibly a shunt.  The magnesium supplement did not help.  Tried Zanaflex yesterday which did not help her pain but just made her sleepy.  Took Tylenol this morning which did not help.   Past Medical History:  Diagnosis Date   Adrenal tumor    Anemia    Phreesia 08/12/2020   Arthritis    Borderline  diabetes 2011   r/t adrenal tumor, now resolved   Carpal tunnel syndrome during pregnancy    Chiari malformation type I (HCC) 2015   on MRI   Cushing's syndrome (HCC) 11/27/2013   Gallbladder problem    Gastroesophageal reflux disease    GERD (gastroesophageal reflux disease)    Phreesia 08/12/2020   Hematuria 09/18/2015   Hemorrhoids    History of Chiari malformation    Hypertension    Phreesia 08/12/2020   Internal hemorrhoids with other complication 12/27/2013   JAN 2015 R ANT MAR 2015 R POS AND L LAT BAND    Menstrual periods irregular 09/18/2015   Migraine without aura, with intractable migraine, so stated, without mention of status migrainosus 11/11/2013   Plantar fasciitis    Prediabetes    Preeclampsia 05/21/2019   2x/wk testing nst alt w/ bpp/dopp      Deliver @ 37wks        IOL scheduled for 8/19 @ 0830   Sleep apnea    Vitamin D deficiency        Home Medications Prior to Admission medications   Medication Sig Start Date End Date Taking? Authorizing Provider  acetaZOLAMIDE (DIAMOX) 250 MG tablet Take 1/2 pill in the morning and 1 pill at bedtime. If side effects can take all in the evening. Also try to get to 2 pills a day preferably one pill twice daily but could be all at night if can't take pill in day due to side effects. 07/25/22  Anson Fret, MD  Calcium Carb-Cholecalciferol 500-10 MG-MCG TABS Take 1 tablet by mouth in the morning, at noon, and at bedtime.    [provider]  clotrimazole-betamethasone (LOTRISONE) cream Apply 1 Application topically 2 (two) times daily. 02/10/23   Kerri Perches, MD  furosemide (LASIX) 20 MG tablet Take 1 tablet (20 mg total) by mouth daily. 10/22/22   Anson Fret, MD  lisinopril (ZESTRIL) 10 MG tablet Take 1 tablet (10 mg total) by mouth daily. Patient not taking: Reported on 12/09/2022 11/30/22   Kerri Perches, MD  minoxidil (LONITEN) 10 MG tablet Take 1 tablet (10 mg total) by mouth daily. 02/10/23    Kerri Perches, MD  Multiple Vitamin (MULTIVITAMIN) tablet Take 1 tablet by mouth daily.    [provider]  pantoprazole (PROTONIX) 40 MG tablet TAKE 1 TABLET BY MOUTH ONCE daily AS NEEDED FOR heartburn 08/15/22   Kerri Perches, MD  prenatal vitamin w/FE, FA (PRENATAL 1 + 1) 27-1 MG TABS tablet Take 1 tablet by mouth daily at 12 noon. 10/28/22   Adline Potter, NP  Vitamin D, Ergocalciferol, (DRISDOL) 1.25 MG (50000 UNIT) CAPS capsule TAKE ONE CAPSULE BY MOUTH ONCE WEEKLY ON [facility] AND ONCE WEEKLY ON SUNDAY 08/15/22   Kerri Perches, MD      Allergies    Penicillins, Wound dressing adhesive, Latex, and Neomycin    Review of Systems   Review of Systems A 10 point review of systems was performed and is negative unless otherwise reported in HPI.  Physical Exam Updated Vital Signs BP (!) 146/111 (BP Location: Right Arm)   Pulse 93   Temp 98.3 F (36.8 C) (Oral)   Resp 18   Ht 5\' 4"  (1.626 m)   Wt 76.7 kg   LMP 05/15/2023 (Approximate)   SpO2 100%   BMI 29.01 kg/m  Physical Exam General: Uncomfortable appearing female, lying in bed.  HEENT: PERRLA, EOMI, Sclera anicteric, MMM, trachea midline.  Cardiology: RRR, no murmurs/rubs/gallops.  Resp: Normal respiratory rate and effort. CTAB, no wheezes, rhonchi, crackles.  Abd: Soft, non-tender, non-distended. No rebound tenderness or guarding.  GU: Deferred. MSK: No peripheral edema or signs of trauma. Extremities without deformity or TTP. No cyanosis or clubbing. Skin: warm, dry. Neuro: A&Ox4, CNs II-XII grossly intact. 5/5 strength all extremities. Sensation grossly intact. Normal speech. Tongue protrudes midline.  Psych: Normal mood and affect.   ED Results / Procedures / Treatments   Labs (all labs ordered are listed, but only abnormal results are displayed) Labs Reviewed  CBC WITH DIFFERENTIAL/PLATELET - Abnormal; Notable for the following components:      Result Value   Hemoglobin 11.8 (*)    HCT  35.9 (*)    All other components within normal limits  BASIC METABOLIC PANEL - Abnormal; Notable for the following components:   Glucose, Bld 103 (*)    Calcium 8.5 (*)    All other components within normal limits  HCG, QUANTITATIVE, PREGNANCY  MAGNESIUM    EKG None  Radiology CT Head Wo Contrast  Result Date: 05/27/2023 CLINICAL DATA:  Headache, intracranial hypertension features HA, recent brain surgery, c/f IICP. EXAM: CT HEAD WITHOUT CONTRAST TECHNIQUE: Contiguous axial images were obtained from the base of the skull through the vertex without intravenous contrast. RADIATION DOSE REDUCTION: This exam was performed according to the departmental dose-optimization program which includes automated exposure control, adjustment of the mA and/or kV according to patient size and/or use of iterative reconstruction technique.  COMPARISON:  Head CT and CT head venogram 03/17/2022. FINDINGS: Brain: Interval postoperative changes of Chiari decompression. No acute hemorrhage. No hydrocephalus or extra-axial collection. Persistent effacement of the prepontine cistern. No mass effect or midline shift. Vascular: No hyperdense vessel or unexpected calcification. Skull: Interval suboccipital craniectomy and resection of the posterior arch of C1. Sinuses/Orbits: No acute findings. Other: None. IMPRESSION: Interval postoperative changes of Chiari decompression. No acute intracranial hemorrhage or hydrocephalus. Electronically Signed   By: Orvan Falconer M.D.   On: 05/27/2023 16:55    Procedures Procedures    Medications Ordered in ED Medications  acetaminophen (TYLENOL) tablet 1,000 mg (1,000 mg Oral Given 05/27/23 1741)  prochlorperazine (COMPAZINE) injection 10 mg (10 mg Intravenous Given 05/27/23 1741)  diphenhydrAMINE (BENADRYL) injection 25 mg (25 mg Intravenous Given 05/27/23 1742)  oxyCODONE (Oxy IR/ROXICODONE) immediate release tablet 5 mg (5 mg Oral Given 05/27/23 1741)  magnesium sulfate IVPB 2 g 50  mL (0 g Intravenous Stopped 05/27/23 1918)    ED Course/ Medical Decision Making/ A&P                          Medical Decision Making Amount and/or Complexity of Data Reviewed Labs: ordered. Radiology: ordered. Decision-making details documented in ED Course.  Risk OTC drugs. Prescription drug management.    This patient presents to the ED for concern of headache, this involves an extensive number of treatment options, and is a complaint that carries with it a high risk of complications and morbidity.  I considered the following differential and admission for this acute, potentially life threatening condition.   MDM:    Patient's headache likely due to IIH/increased ICP given her history, and headache features consistent with this including worse with lying down and Valsalva.   Will try headache cocktail and reevaluate, can discuss therapeutic LP as well for symptom control.  CTH doesn't demonstrate ICH or hydrocephalus. She doesn't have any focal neuro deficits and no blurry vision to indicate CVA or herniation. Also consider tension headache, migraine, though w/ patient's clinical history likely IIH. She has had no f/c or AMS to indicate meningoencephalitis. Will also get basic labs to assess for electrolyte derangements, significant dehydration, or leukocytosis.  Clinical Course as of 05/27/23 1958  Sat May 27, 2023  1808 CT Head Wo Contrast Interval postoperative changes of Chiari decompression. No acute intracranial hemorrhage or hydrocephalus.   [HN]  1808 Labs unrevealing [HN]  1949 Patient states her headache is significantly improved after headache cocktail. She does not have blurry vision or visual changes here. No FNDs on repeat exam. I discussed with patient that as we believe her HA is due to IIH, could do LP for CSF drainage as treatment. Performed shared decision making with patient and she states that she has f/u with NSGY on Monday in clinic. Since she feels so much  better discussed with patient and will forego LP today and will f/u with NSGY on Monday. Patient is encouraged to return to ED if headache worsens again, if she experiences blurry vision or visual changes, neurologic symptoms, or other new/concerning symptoms. Instructed to continue her diamox and magnesium at home. Patient reports understanding. All questions answered to patient's satisfaction. [HN]    Clinical Course User Index [HN] Loetta Rough, MD    Labs: I Ordered, and personally interpreted labs.  The pertinent results include: Those listed above  Imaging Studies ordered: I ordered imaging studies including CT head I independently visualized  and interpreted imaging. I agree with the radiologist interpretation  Additional history obtained from chart review.  External records from outside source obtained and reviewed including [facility] neurosurgery  Reevaluation: After the interventions noted above, I reevaluated the patient and found that they have :improved  Social Determinants of Health:  lives independently  Disposition:  DC w/ NSGY f/u, return precautions  Co morbidities that complicate the patient evaluation  Past Medical History:  Diagnosis Date   Adrenal tumor    Anemia    Phreesia 08/12/2020   Arthritis    Borderline diabetes 2011   r/t adrenal tumor, now resolved   Carpal tunnel syndrome during pregnancy    Chiari malformation type I (HCC) 2015   on MRI   Cushing's syndrome (HCC) 11/27/2013   Gallbladder problem    Gastroesophageal reflux disease    GERD (gastroesophageal reflux disease)    Phreesia 08/12/2020   Hematuria 09/18/2015   Hemorrhoids    History of Chiari malformation    Hypertension    Phreesia 08/12/2020   Internal hemorrhoids with other complication 12/27/2013   JAN 2015 R ANT MAR 2015 R POS AND L LAT BAND    Menstrual periods irregular 09/18/2015   Migraine without aura, with intractable migraine, so stated, without mention of status  migrainosus 11/11/2013   Plantar fasciitis    Prediabetes    Preeclampsia 05/21/2019   2x/wk testing nst alt w/ bpp/dopp      Deliver @ 37wks        IOL scheduled for 8/19 @ 0830   Sleep apnea    Vitamin D deficiency      Medicines Meds ordered this encounter  Medications   acetaminophen (TYLENOL) tablet 1,000 mg   prochlorperazine (COMPAZINE) injection 10 mg   diphenhydrAMINE (BENADRYL) injection 25 mg   oxyCODONE (Oxy IR/ROXICODONE) immediate release tablet 5 mg   magnesium sulfate IVPB 2 g 50 mL    I have reviewed the patients home medicines and have made adjustments as needed  Problem List / ED Course: Problem List Items Addressed This Visit   None Visit Diagnoses     Bad headache    -  Primary   Relevant Medications   acetaminophen (TYLENOL) tablet 1,000 mg (Completed)   oxyCODONE (Oxy IR/ROXICODONE) immediate release tablet 5 mg (Completed)   History of idiopathic intracranial hypertension                       This note was created using dictation software, which may contain spelling or grammatical errors.    Loetta Rough, MD 05/27/23 [phone]

## 2023-05-27 NOTE — Discharge Instructions (Signed)
Thank you for coming to [facility] Emergency Department. You were seen for headache. We did an exam, labs, and imaging, and these showed likely a headache related to your IIH. Please follow up with your neurosurgeon on Monday in clinic. If your symptoms get worse or you develop neurologic symptoms such as blurry vision, asymmetric numbness/tingling, asymmetric weakness, fevers/chills, loss of consciousness or confusion, please return to the ED immediately.

## 2023-05-27 NOTE — ED Triage Notes (Signed)
Pt pressure to head, recent brain surgery on June 18.  LP was done back in July due to fluid build up.  HA's are the same as before.

## 2023-07-04 ENCOUNTER — Encounter: Payer: Self-pay | Admitting: Family Medicine

## 2023-07-04 ENCOUNTER — Ambulatory Visit (INDEPENDENT_AMBULATORY_CARE_PROVIDER_SITE_OTHER): Payer: 59 | Admitting: Family Medicine

## 2023-07-04 VITALS — BP 138/86 | HR 89 | Ht 64.0 in | Wt 177.1 lb

## 2023-07-04 DIAGNOSIS — R519 Headache, unspecified: Secondary | ICD-10-CM | POA: Diagnosis not present

## 2023-07-04 DIAGNOSIS — G8929 Other chronic pain: Secondary | ICD-10-CM

## 2023-07-04 DIAGNOSIS — E669 Obesity, unspecified: Secondary | ICD-10-CM

## 2023-07-04 DIAGNOSIS — R7302 Impaired glucose tolerance (oral): Secondary | ICD-10-CM

## 2023-07-04 DIAGNOSIS — R030 Elevated blood-pressure reading, without diagnosis of hypertension: Secondary | ICD-10-CM

## 2023-07-04 DIAGNOSIS — G935 Compression of brain: Secondary | ICD-10-CM | POA: Diagnosis not present

## 2023-07-04 NOTE — Progress Notes (Unsigned)
Janet [name]     MRN: [id number]      DOB: 1985/11/27  Chief Complaint  Patient presents with   Follow-up    Follow up brain surgery back in june    HPI Janet [name] is here for follow up and re-evaluation of chronic medical conditions, medication management and review of any available recent lab and radiology data.  Preventive health is updated, specifically  Cancer screening and Immunization.   Questions or concerns regarding consultations or procedures which the PT has had in the interim are  addressed. The PT denies any adverse reactions to current medications since the last visit.  Treports daily headaches, primarily at the nape, has had 2 severe  headaches  fri and Sunday last  worked 11 hrs each shift,no good response to recent surgery for Chiari malformation and raised intra cranial pressure. Headache continue and though shunt placement has been proposed by Neurosurgeon , she is very hesitant as she had significant post op complications and was in ICU for 4 days. No tolerating low dose diamox , and has weekly appointments with Neurosrgeon as to management plan and d her progress, I also recommend getting Neurology involved again Weight stable, nausea resolved  Right foot pain much improved with weiht loss, no plans for any surgical intervention ROS Denies recent fever or chills. Denies sinus pressure, nasal congestion, ear pain or sore throat. Denies chest congestion, productive cough or wheezing. Denies chest pains, palpitations and leg swelling Denies abdominal pain, nausea, vomiting,diarrhea or constipation.   Denies dysuria, frequency, hesitancy or incontinence. Denies joint pain, swelling and limitation in mobility.  Denies depression, anxiety or insomnia. Denies skin break down or rash.   PE  BP 138/86   Pulse 89   Ht 5\' 4"  (1.626 m)   Wt 177 lb 1.3 oz (80.3 kg)   SpO2 97%   BMI 30.40 kg/m   Patient alert and oriented and in no cardiopulmonary distress.Looks  uncomfortable andin pain  HEENT: No facial asymmetry, EOMI,     Neck adequate ROM  Chest: Clear to auscultation bilaterally.  CVS: S1, S2 no murmurs, no S3.Regular rate.  ABD: Soft non tender.   Ext: No edema  MS: Adequate ROM spine, shoulders, hips and knees.  Skin: Intact, no ulcerations or rash noted.  Psych: Good eye contact, normal affect. Memory intact not anxious or depressed appearing.  CNS: CN 2-12 intact, power,  normal throughout.no focal deficits noted.   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  The importance of healthy food choices with portion control discussed, as well as eating regularly and within a 12 hour window most days. The need to choose "clean , green" food 50 to 75% of the time is discussed, as well as to make water the primary drink and set a goal of 64 ounces water daily.       07/04/2023    3:22 PM  05/27/2023    4:05 PM 02/17/2023   12:37 PM  Weight /BMI  Weight 177 lb 1.3 oz 169 lb   Height 5\' 4"  (1.626 m) 5\' 4"  (1.626 m)   BMI 30.4 kg/m2 29.01 kg/m2      Information is confidential and restricted. Go to Review Flowsheets to unlock data.    Slight weight gain, however has had significant health challenges in recent time with n/v  Elevated blood pressure reading without diagnosis of hypertension DASH diet and commitment to daily  physical activity for a minimum of 30 minutes discussed and encouraged, as a part of hypertension management. The importance of attaining a healthy weight is also discussed.     07/04/2023    4:00 PM 07/04/2023    3:24 PM 07/04/2023    3:22 PM 05/27/2023    8:12 PM 05/27/2023    4:05 PM 05/27/2023    4:02 PM 02/17/2023    1:57 PM  BP/Weight  Systolic BP 138 125 142 135  146   Diastolic BP 86 84 84 94  111   Wt. (Lbs)   177.08  169    BMI   30.4 kg/m2  29.01 kg/m2       Information is confidential and restricted. Go to Review Flowsheets to unlock data.       Glucose intolerance (impaired glucose tolerance) Patient educated about the importance of limiting  Carbohydrate intake , the need to commit to daily physical activity for a minimum of 30 minutes , and to commit weight loss. The fact that changes in all these areas will reduce or eliminate all together the development of diabetes is stressed.      Latest Ref Rng & Units 05/27/2023    4:50 PM 12/16/2022    4:00 PM 12/01/2022    7:14 PM 10/13/2022    1:53 PM 08/12/2022   11:20 AM  Diabetic Labs  Creatinine 0.44 - 1.00 mg/dL 4.40  3.47  4.25  9.56  0.80   GFR >60.00 mL/min    112.01  94.54       07/04/2023    4:00 PM 07/04/2023    3:24 PM 07/04/2023    3:22 PM 05/27/2023    8:12 PM 05/27/2023    4:05 PM 05/27/2023    4:02 PM 02/17/2023    1:57 PM  BP/Weight  Systolic BP 138 125 142 135  146   Diastolic BP 86 84 84 94  111   Wt. (Lbs)   177.08  169    BMI   30.4 kg/m2  29.01 kg/m2       Information is confidential and restricted. Go to Review Flowsheets to unlock data.       No data to display          Updated lab needed   Dyslipidemia Hyperlipidemia:Low fat diet discussed and encouraged.   Lipid Panel  Lab Results  Component Value Date   CHOL 138 08/02/2022   HDL 41 08/02/2022   LDLCALC 79 08/02/2022   TRIG 94 08/02/2022   CHOLHDL 3.4 08/02/2022

## 2023-07-05 ENCOUNTER — Encounter: Payer: Self-pay | Admitting: Family Medicine

## 2023-07-05 DIAGNOSIS — R519 Headache, unspecified: Secondary | ICD-10-CM | POA: Insufficient documentation

## 2023-07-05 DIAGNOSIS — R030 Elevated blood-pressure reading, without diagnosis of hypertension: Secondary | ICD-10-CM | POA: Insufficient documentation

## 2023-07-05 DIAGNOSIS — E785 Hyperlipidemia, unspecified: Secondary | ICD-10-CM | POA: Insufficient documentation

## 2023-07-05 NOTE — Assessment & Plan Note (Signed)
DASH diet and commitment to daily physical activity for a minimum of 30 minutes discussed and encouraged, as a part of hypertension management. The importance of attaining a healthy weight is also discussed.     07/04/2023    4:00 PM 07/04/2023    3:24 PM 07/04/2023    3:22 PM 05/27/2023    8:12 PM 05/27/2023    4:05 PM 05/27/2023    4:02 PM 02/17/2023    1:57 PM  BP/Weight  Systolic BP 138 125 142 135  146   Diastolic BP 86 84 84 94  111   Wt. (Lbs)   177.08  169    BMI   30.4 kg/m2  29.01 kg/m2       Information is confidential and restricted. Go to Review Flowsheets to unlock data.

## 2023-07-05 NOTE — Assessment & Plan Note (Signed)
  Patient re-educated about  the importance of commitment to a  minimum of 150 minutes of exercise per week as able.  The importance of healthy food choices with portion control discussed, as well as eating regularly and within a 12 hour window most days. The need to choose "clean , green" food 50 to 75% of the time is discussed, as well as to make water the primary drink and set a goal of 64 ounces water daily.       07/04/2023    3:22 PM 05/27/2023    4:05 PM 02/17/2023   12:37 PM  Weight /BMI  Weight 177 lb 1.3 oz 169 lb   Height 5\' 4"  (1.626 m) 5\' 4"  (1.626 m)   BMI 30.4 kg/m2 29.01 kg/m2      Information is confidential and restricted. Go to Review Flowsheets to unlock data.    Slight weight gain, however has had significant health challenges in recent time with n/v

## 2023-07-05 NOTE — Assessment & Plan Note (Signed)
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

## 2023-07-05 NOTE — Patient Instructions (Addendum)
F/U in 4 months, to re evaluate chronic health problems , call if you need me sooner  I recommend both the  flu and covid vaccines  Please get fasting lipid, cmp and EGFr, TSh, HBA1C  and vit D, mid October, we will mail lab order  I do recommend reduced work schedule based on uncontrolled headaches, discuss with neurosurgeon at upcoming visit  Thanks for choosing [facility], we consider it a privelige to serve you.  Hoping that your headaches are better controlled in the near future

## 2023-07-05 NOTE — Assessment & Plan Note (Signed)
Hyperlipidemia:Low fat diet discussed and encouraged.   Lipid Panel  Lab Results  Component Value Date   CHOL 138 08/02/2022   HDL 41 08/02/2022   LDLCALC 79 08/02/2022   TRIG 94 08/02/2022   CHOLHDL 3.4 08/02/2022

## 2023-07-05 NOTE — Assessment & Plan Note (Signed)
Patient educated about the importance of limiting  Carbohydrate intake , the need to commit to daily physical activity for a minimum of 30 minutes , and to commit weight loss. The fact that changes in all these areas will reduce or eliminate all together the development of diabetes is stressed.      Latest Ref Rng & Units 05/27/2023    4:50 PM 12/16/2022    4:00 PM 12/01/2022    7:14 PM 10/13/2022    1:53 PM 08/12/2022   11:20 AM  Diabetic Labs  Creatinine 0.44 - 1.00 mg/dL 4.74  2.59  5.63  8.75  0.80   GFR >60.00 mL/min    112.01  94.54       07/04/2023    4:00 PM 07/04/2023    3:24 PM 07/04/2023    3:22 PM 05/27/2023    8:12 PM 05/27/2023    4:05 PM 05/27/2023    4:02 PM 02/17/2023    1:57 PM  BP/Weight  Systolic BP 138 125 142 135  146   Diastolic BP 86 84 84 94  111   Wt. (Lbs)   177.08  169    BMI   30.4 kg/m2  29.01 kg/m2       Information is confidential and restricted. Go to Review Flowsheets to unlock data.       No data to display          Updated lab needed

## 2023-07-05 NOTE — Assessment & Plan Note (Signed)
Surgery in 2024,post op complications of severe bradycardia requiring ICU stay for 4 days , also intractable vomiting.Peristent headahces with raised ICP, no response to meds, rept syurgery being proposed , however pt very hesitant at this point

## 2023-08-14 ENCOUNTER — Encounter: Payer: Self-pay | Admitting: Family Medicine

## 2023-08-15 ENCOUNTER — Ambulatory Visit (HOSPITAL_COMMUNITY)
Admission: RE | Admit: 2023-08-15 | Discharge: 2023-08-15 | Disposition: A | Discharge status: Home / Self Care | Payer: 59 | Source: Ambulatory Visit | Attending: Family Medicine | Admitting: Family Medicine

## 2023-08-15 ENCOUNTER — Ambulatory Visit (INDEPENDENT_AMBULATORY_CARE_PROVIDER_SITE_OTHER): Payer: 59 | Admitting: Family Medicine

## 2023-08-15 VITALS — BP 132/91 | HR 73 | Resp 16 | Ht 64.0 in | Wt 174.1 lb

## 2023-08-15 DIAGNOSIS — I83891 Varicose veins of right lower extremities with other complications: Secondary | ICD-10-CM | POA: Insufficient documentation

## 2023-08-15 DIAGNOSIS — I83892 Varicose veins of left lower extremities with other complications: Secondary | ICD-10-CM | POA: Diagnosis not present

## 2023-08-15 DIAGNOSIS — E559 Vitamin D deficiency, unspecified: Secondary | ICD-10-CM

## 2023-08-15 DIAGNOSIS — I8392 Asymptomatic varicose veins of left lower extremity: Secondary | ICD-10-CM | POA: Insufficient documentation

## 2023-08-15 DIAGNOSIS — G8929 Other chronic pain: Secondary | ICD-10-CM

## 2023-08-15 DIAGNOSIS — R030 Elevated blood-pressure reading, without diagnosis of hypertension: Secondary | ICD-10-CM

## 2023-08-15 DIAGNOSIS — Z23 Encounter for immunization: Secondary | ICD-10-CM | POA: Diagnosis not present

## 2023-08-15 DIAGNOSIS — E249 Cushing's syndrome, unspecified: Secondary | ICD-10-CM

## 2023-08-15 DIAGNOSIS — R519 Headache, unspecified: Secondary | ICD-10-CM

## 2023-08-15 NOTE — Patient Instructions (Addendum)
F/u as before, call if you need me sooner  I believe that this is a broken superficial varicose vein, use cool/ice compresses twice daily to area as tolerated/needed  You need to get an [place] when you leave here to ensure no deep clot in your leg ( Nurse pls order) I am also referring you to Vascular surgeon re superficial varicose veins  Thanks for choosing [facility], we consider it a privelige to serve you.   Fasting lipid, cmp and eGFR , CBC, TSH ,hBA1C and vit D as soon as possible  Thanks for choosing [facility], we consider it a privelige to serve you.

## 2023-08-15 NOTE — Assessment & Plan Note (Signed)
Persitent elevation of blood pressure, no medication as constant report from pt is that BP is normal at home

## 2023-08-15 NOTE — Progress Notes (Signed)
   Janet [name]     MRN: [id number]      DOB: April 01, 1986  Chief Complaint  Patient presents with   Knot on leg    X fri. States she didn't hit her leg on anything but there was a knot and then it turned to a bruise but it was very painful and hurt to move it     HPI [name] is here c/o pain  , bruising and swelling to lateral side and back of right  knee x 5 days, developed after runniong, no direct trauma.  Pain and swelling are lessening however  has had upper extremity blood clot and is concerned. No h/o cough , hemoptysis or shortness of breath ROS Denies recent fever or chills. Denies sinus pressure, nasal congestion, ear pain or sore throat. Denies chest congestion, productive cough or wheezing. Denies chest pains, palpitations and leg swelling Denies abdominal pain, nausea, vomiting,diarrhea or constipation.   Denies dysuria, frequency, hesitancy or incontinence. Denies headaches, seizures, numbness, or tingling. Denies depression, anxiety or insomnia. Denies skin break down or rash.   PE  BP (!) 132/91   Pulse 73   Resp 16   Ht 5\' 4"  (1.626 m)   Wt 174 lb 1.9 oz (79 kg)   SpO2 98%   BMI 29.89 kg/m   Patient alert and oriented and in no cardiopulmonary distress.  HEENT: No facial asymmetry, EOMI,     Neck supple .  Chest: Clear to auscultation bilaterally.  CVS: S1, S2 no murmurs, no S3.Regular rate.  ABD: Soft non tender.   Ext: No edema  MS: Adequate ROM spine, shoulders, hips and knees.Bruising , swelling and tenderness lateral aspect right knee, no calf tenderness, superficial distended varicose veins bilaterally  Skin: Intact, no ulcerations or rash noted.discoloration , bruising and swelling which is tender on lateral aspect right knee extending posteriorly  Psych: Good eye contact, normal affect. Memory intact not anxious or depressed appearing.  CNS: CN 2-12 intact, power,  normal throughout.no focal deficits noted.   Assessment &  Plan  Varicose veins of leg with edema, right Stat US to r/o DVT and refer to vascular surgery for further evaluation  Varicose veins of left lower leg Distension of superficial veins left , refer vascular  Elevated blood pressure reading without diagnosis of hypertension Persitent elevation of blood pressure, no medication as constant report from pt is that BP is normal at home  Chronic nonintractable headache Has LP scheduled for 08/18/2023 to control headaches  Encounter for immunization After obtaining informed consent, the vaccine is  administered , with no adverse effect noted at the time of administration.

## 2023-08-15 NOTE — Assessment & Plan Note (Signed)
After obtaining informed consent, the vaccine is  administered , with no adverse effect noted at the time of administration.  

## 2023-08-15 NOTE — Assessment & Plan Note (Signed)
Distension of superficial veins left , refer vascular

## 2023-08-15 NOTE — Assessment & Plan Note (Signed)
Has LP scheduled for 08/18/2023 to control headaches

## 2023-08-15 NOTE — Assessment & Plan Note (Signed)
Stat US to r/o DVT and refer to vascular surgery for further evaluation

## 2023-08-17 ENCOUNTER — Other Ambulatory Visit: Payer: Self-pay | Admitting: *Deleted

## 2023-08-17 DIAGNOSIS — I8392 Asymptomatic varicose veins of left lower extremity: Secondary | ICD-10-CM

## 2023-08-21 ENCOUNTER — Ambulatory Visit (HOSPITAL_COMMUNITY)
Admission: RE | Admit: 2023-08-21 | Discharge: 2023-08-21 | Disposition: A | Discharge status: Home / Self Care | Payer: 59 | Source: Ambulatory Visit | Attending: Vascular Surgery | Admitting: Vascular Surgery

## 2023-08-21 DIAGNOSIS — I8391 Asymptomatic varicose veins of right lower extremity: Secondary | ICD-10-CM | POA: Diagnosis not present

## 2023-08-21 DIAGNOSIS — I8392 Asymptomatic varicose veins of left lower extremity: Secondary | ICD-10-CM | POA: Diagnosis present

## 2023-09-18 NOTE — Progress Notes (Unsigned)
[facility] [place]  ASSESSMENT / PLAN: 37 y.o. female with reticular veins about the right lateral knee.  Patient had a painful palpable cord in this area typical of thrombophlebitis which is since resolved.  I counseled the patient about the benign nature of this.  She has no concerning features for thrombophilia.  I counseled her that topical sclerotherapy could be considered if this was cosmetically acceptable to her.  She is understanding and prefers conservative management for now.  Will see her back as needed.  CHIEF COMPLAINT: Varicose veins  HISTORY OF PRESENT ILLNESS: Janet [name] is a 37 y.o. female referred for evaluation.  Patient has a prominent cluster of reticular veins about the right lateral knee.  She describes a painful, swollen.  About the same area beginning a couple weeks ago, which slowly resolved.  Today the area is nontender and there is no cord.  She reports the sensation was similar to prior episode of thrombophlebitis she had in her left arm.  Past Medical History:  Diagnosis Date   Adrenal tumor    Anemia    Phreesia 08/12/2020   Arthritis    Borderline diabetes 2011   r/t adrenal tumor, now resolved   Carpal tunnel syndrome during pregnancy    Chiari malformation type I (HCC) 2015   on MRI   Cushing's syndrome (HCC) 11/27/2013   Gallbladder problem    Gastroesophageal reflux disease    GERD (gastroesophageal reflux disease)    Phreesia 08/12/2020   Hematuria 09/18/2015   Hemorrhoids    History of Chiari malformation    Hypertension    Phreesia 08/12/2020   Internal hemorrhoids with other complication 12/27/2013   JAN 2015 R ANT MAR 2015 R POS AND L LAT BAND    Menstrual periods irregular 09/18/2015   Migraine without aura, with intractable migraine, so stated, without mention of status migrainosus 11/11/2013   Plantar fasciitis    Prediabetes    Preeclampsia 05/21/2019   2x/wk testing nst alt w/ bpp/dopp      Deliver @  37wks        IOL scheduled for 8/19 @ 0830   Sleep apnea    Vitamin D deficiency     Past Surgical History:  Procedure Laterality Date   arthroscopic treatment of osteochondral defect of right ankle  03/2021   Bilateral plantar fascial [place]  03/2021   CHOLECYSTECTOMY  02/2015   COLONOSCOPY N/A 04/19/2013   TKZ:SWFUXN mucosa in the terminal ileum/Single erosion in transverse colon/RECTAL BLEEDING DUE TO Moderate sized internal hemorrhoids/ trv colon erosion, bx benign.   COLONOSCOPY WITH PROPOFOL N/A 08/31/2020   Non-bleeding internal hemorrhoids. Colon torturous.    ESOPHAGOGASTRODUODENOSCOPY N/A 04/19/2013   ATF:TDDU Non-erosive gastritis/PERI-UMBILICAL PAIN DUE TO GERD, GASTRITIS, AND CONSTIPATION. Bx with mild chronic inactive gastritis. No H.Pylori   GASTRIC ROUX-EN-Y N/A 06/21/2022   Procedure: LAPAROSCOPIC ROUX-EN-Y GASTRIC BYPASS WITH UPPER ENDOSCOPY WITH HIATAL HERNIA REPAIR;  Surgeon: Gaynelle Adu, MD;  Location: [facility];  Service: General;  Laterality: N/A;   HEMORRHOID BANDING  2015   Dr. Jena Gauss- in office banding procedure   LAPAROSCOPIC ADRENALECTOMY  10/28/2010   TONSILLECTOMY     tumor removed left adrenal gland 01/12      Family History  Problem Relation Age of Onset   Hypertension Mother    Hyperlipidemia Mother    Colon polyps Mother    Hypertension Father    Colon polyps Father    Sleep apnea Father  Obesity Father    Deep vein thrombosis Sister    Hypertension Maternal Grandmother    Diabetes Maternal Grandmother    Deep vein thrombosis Maternal Grandmother    Diabetes Paternal Grandmother    COPD Paternal Grandfather    Heart disease Paternal Grandfather    Other Daughter        pulmonary valve stenosis   Stroke Maternal Aunt    Migraines Maternal Aunt    Colon cancer Other        maternal great uncle    Social History   Socioeconomic History   Marital status: Single    Spouse name: Not on file   Number of children: 1   Years of education:  BA   Highest education level: Bachelor's degree (e.g., BA, AB, BS)  Occupational History   Occupation: full time IT sales professional: [employer]  Tobacco Use   Smoking status: Never   Smokeless tobacco: Never  Vaping Use   Vaping status: Never Used  Substance and Sexual Activity   Alcohol use: No   Drug use: No   Sexual activity: Yes    Birth control/protection: Surgical    Comment: vasectomy  Other Topics Concern   Not on file  Social History Narrative   Lives with spouse and 2 children   Right Handed   Drinks no caffeine   Social Determinants of Health   Financial Resource Strain: Low Risk  (08/15/2023)   Overall Financial Resource Strain (CARDIA)    Difficulty of Paying Living Expenses: Not hard at all  Food Insecurity: No Food Insecurity (08/15/2023)   Hunger Vital Sign    Worried About Running Out of Food in the Last Year: Never true    Ran Out of Food in the Last Year: Never true  Transportation Needs: No Transportation Needs (08/15/2023)   PRAPARE - Administrator, Civil Service (Medical): No    Lack of Transportation (Non-Medical): No  Physical Activity: Sufficiently Active (08/15/2023)   Exercise Vital Sign    Days of Exercise per Week: 5 days    Minutes of Exercise per Session: 30 min  Stress: No Stress Concern Present (03/28/2023)   Received from [facility], [facility] of Occupational Health - Occupational Stress Questionnaire    Feeling of Stress : Not at all  Social Connections: Moderately Integrated (08/15/2023)   Social Connection and Isolation Panel [NHANES]    Frequency of Communication with Friends and Family: More than three times a week    Frequency of Social Gatherings with Friends and Family: Once a week    Attends Religious Services: More than 4 times per year    Active Member of [employer] or Organizations: Yes    Attends Engineer, structural: More than 4 times per year    Marital Status: Never  married  Intimate Partner Violence: Not At Risk (04/08/2023)   Received from [facility], [facility]   HITS    Over the last 12 months how often did your partner physically hurt you?: Never    Over the last 12 months how often did your partner insult you or talk down to you?: Never    Over the last 12 months how often did your partner threaten you with physical harm?: Never    Over the last 12 months how often did your partner scream or curse at you?: Never    Allergies  Allergen Reactions   Penicillins Anaphylaxis, Rash and Other (  See Comments)    Has patient had a PCN reaction causing immediate rash, facial/tongue/throat swelling, SOB or lightheadedness with hypotension: No Has patient had a PCN reaction causing severe rash involving mucus membranes or skin necrosis: No Has patient had a PCN reaction that required hospitalization No Has patient had a PCN reaction occurring within the last 10 years: No If all of the above answers are "NO", then may proceed with Cephalosporin use.   Wound Dressing Adhesive     Burns skin   Latex Rash   Neomycin Rash    Breaks out with neosporin    Current Outpatient Medications  Medication Sig Dispense Refill   acetaZOLAMIDE (DIAMOX) 250 MG tablet Take 1/2 pill in the morning and 1 pill at bedtime. If side effects can take all in the evening. Also try to get to 2 pills a day preferably one pill twice daily but could be all at night if can't take pill in day due to side effects. 180 tablet 4   Calcium Carb-Cholecalciferol 500-10 MG-MCG TABS Take 1 tablet by mouth in the morning, at noon, and at bedtime.     clotrimazole-betamethasone (LOTRISONE) cream Apply 1 Application topically 2 (two) times daily. 45 g 1   furosemide (LASIX) 20 MG tablet Take 1 tablet (20 mg total) by mouth daily. 30 tablet 2   Multiple Vitamin (MULTIVITAMIN) tablet Take 1 tablet by mouth daily.     pantoprazole (PROTONIX) 40 MG tablet TAKE 1 TABLET BY MOUTH ONCE daily AS  NEEDED FOR heartburn 30 tablet 6   prenatal vitamin w/FE, FA (PRENATAL 1 + 1) 27-1 MG TABS tablet Take 1 tablet by mouth daily at 12 noon. 30 tablet 12   Vitamin D, Ergocalciferol, (DRISDOL) 1.25 MG (50000 UNIT) CAPS capsule TAKE ONE CAPSULE BY MOUTH ONCE WEEKLY ON WEDNESDAY AND ONCE WEEKLY ON SUNDAY 8 capsule 6   No current facility-administered medications for this visit.   Facility-Administered Medications Ordered in Other Visits  Medication Dose Route Frequency Provider Last Rate Last Admin   ondansetron (ZOFRAN-ODT) disintegrating tablet 4 mg  4 mg Oral Q4H PRN Stechschulte, Hyman Hopes, MD       Or   ondansetron (ZOFRAN) 4 mg in sodium chloride 0.9 % 50 mL IVPB  4 mg Intravenous Q4H PRN Stechschulte, Hyman Hopes, MD       sodium chloride 0.9 % 1,000 mL with thiamine 100 mg, folic acid 1 mg, M.V.I. Adult 10 mL infusion   Intravenous Once Stechschulte, Hyman Hopes, MD       sodium chloride 0.9 % bolus 1,000 mL  1,000 mL Intravenous Once Stechschulte, Hyman Hopes, MD        PHYSICAL EXAM There were no vitals filed for this visit.  Well-appearing middle-age woman in no distress Regular rate and rhythm Unlabored breathing Small cluster of reticular veins about the right lateral knee which is nontender  PERTINENT LABORATORY AND RADIOLOGIC DATA  Most recent CBC    Latest Ref Rng & Units 05/27/2023    4:50 PM 12/01/2022    7:14 PM 08/02/2022    4:37 PM  CBC  WBC 4.0 - 10.5 K/uL 6.5  7.0  6.9   Hemoglobin 12.0 - 15.0 g/dL 94.8  54.6  27.0   Hematocrit 36.0 - 46.0 % 35.9  35.3  34.7   Platelets 150 - 400 K/uL 367  366  316      Most recent CMP    Latest Ref Rng & Units 05/27/2023    4:50  PM 12/16/2022    4:00 PM 12/01/2022    7:14 PM  CMP  Glucose 70 - 99 mg/dL 161  93  96   BUN 6 - 20 mg/dL 8  11  7    Creatinine 0.44 - 1.00 mg/dL 0.96  0.45  4.09   Sodium 135 - 145 mmol/L 138  139  139   Potassium 3.5 - 5.1 mmol/L 4.1  4.2  3.3   Chloride 98 - 111 mmol/L 106  108  105   CO2 22 - 32 mmol/L 24   20  27    Calcium 8.9 - 10.3 mg/dL 8.5  8.6  8.3     Renal function CrCl cannot be calculated (Patient's most recent lab result is older than the maximum 21 days allowed.).  Hgb A1c MFr Bld (%)  Date Value  12/08/2020 6.2 (H)    LDL Cholesterol (Calc)  Date Value Ref Range Status  03/20/2020 99 mg/dL (calc) Final    Comment:    Reference range: <100 . Desirable range <100 mg/dL for primary prevention;   <70 mg/dL for patients with CHD or diabetic patients  with > or = 2 CHD risk factors. Marland Kitchen LDL-C is now calculated using the Martin-Hopkins  calculation, which is a validated novel method providing  better accuracy than the Friedewald equation in the  estimation of LDL-C.  Horald Pollen et al. Lenox Ahr. 8119;147(82): 2061-2068  (http://education.[facility].com/faq/FAQ164)    LDL Chol Calc (NIH)  Date Value Ref Range Status  08/02/2022 79 0 - 99 mg/dL Final    Right lower extremity venous reflux:  - No evidence of deep vein thrombosis seen in the right lower extremity,  from the common femoral through the popliteal veins.  - No evidence of superficial venous thrombosis in the right lower  extremity.    - Area of interest at the lateral knee shows small, patent varicosities.   - No deep vein reflux.   - Superficial vein reflux at the proximal calf.   Rande Brunt. Lenell Antu, MD [facility] Phone Number: [phone] 09/18/2023 9:11 PM   Total time spent on preparing this encounter including chart review, data review, collecting history, examining the patient, coordinating care for this new patient, 45 minutes.  Portions of this report may have been transcribed using voice recognition software.  Every effort has been made to ensure accuracy; however, inadvertent computerized transcription errors may still be present.

## 2023-09-19 ENCOUNTER — Encounter: Payer: Self-pay | Admitting: Vascular Surgery

## 2023-09-19 ENCOUNTER — Ambulatory Visit (INDEPENDENT_AMBULATORY_CARE_PROVIDER_SITE_OTHER): Payer: 59 | Admitting: Vascular Surgery

## 2023-09-19 VITALS — BP 133/85 | HR 61 | Temp 98.2°F | Resp 20 | Ht 64.0 in | Wt 176.0 lb

## 2023-09-19 DIAGNOSIS — I872 Venous insufficiency (chronic) (peripheral): Secondary | ICD-10-CM | POA: Diagnosis not present

## 2023-10-03 ENCOUNTER — Telehealth: Payer: 59 | Admitting: Physician Assistant

## 2023-10-03 DIAGNOSIS — H5710 Ocular pain, unspecified eye: Secondary | ICD-10-CM

## 2023-10-03 DIAGNOSIS — H5711 Ocular pain, right eye: Secondary | ICD-10-CM | POA: Diagnosis not present

## 2023-10-03 MED ORDER — ERYTHROMYCIN 5 MG/GM OP OINT: 1.0000 | TOPICAL_OINTMENT | Freq: Every day | OPHTHALMIC | 0 refills | Status: AC

## 2023-10-03 NOTE — Progress Notes (Signed)
Virtual Visit Consent   Janet [name], you are scheduled for a virtual visit with a [facility] provider today. Just as with appointments in the office, your consent must be obtained to participate. Your consent will be active for this visit and any virtual visit you may have with one of our providers in the next 365 days. If you have a MyChart account, a copy of this consent can be sent to you electronically.  As this is a virtual visit, video technology does not allow for your provider to perform a traditional examination. This may limit your provider's ability to fully assess your condition. If your provider identifies any concerns that need to be evaluated in person or the need to arrange testing (such as labs, EKG, etc.), we will make arrangements to do so. Although advances in technology are sophisticated, we cannot ensure that it will always work on either your end or our end. If the connection with a video visit is poor, the visit may have to be switched to a telephone visit. With either a video or telephone visit, we are not always able to ensure that we have a secure connection.  By engaging in this virtual visit, you consent to the provision of healthcare and authorize for your insurance to be billed (if applicable) for the services provided during this visit. Depending on your insurance coverage, you may receive a charge related to this service.  I need to obtain your verbal consent now. Are you willing to proceed with your visit today? Janet [name] has provided verbal consent on 10/03/2023 for a virtual visit (video or telephone). Janet Fantasia Ward, PA-C  Date: 10/03/2023 6:39 PM  Virtual Visit via Video Note   I, Janet [name], connected with  Janet [name]  ([id number], Jul 23, 1986) on 10/03/23 at  6:45 PM EST by a video-enabled telemedicine application and verified that I am speaking with the correct person using two identifiers.  Location: Patient: Virtual Visit Location  Patient: Home Provider: Virtual Visit Location Provider: Home Office   I discussed the limitations of evaluation and management by telemedicine and the availability of in person appointments. The patient expressed understanding and agreed to proceed.    History of Present Illness: Janet [name] is a 37 y.o. who identifies as a female who was assigned female at birth, and is being seen today for eye pain that started last night after she was poked in the eye by her daughter accidentally.  She reports she is straining more causing a headache.  Denies visual changes.  She does wear glasses.  She reports she called her eye doctor and they were closed.  She reports when this happened before she was given antibiotic eye ointment.  HPI: HPI  Problems:  Patient Active Problem List   Diagnosis Date Noted   Varicose veins of left lower leg 08/15/2023   Varicose veins of leg with edema, right 08/15/2023   Encounter for immunization 08/15/2023   Persistent headaches 07/05/2023   Elevated blood pressure reading without diagnosis of hypertension 07/05/2023   Dehydration 02/17/2023   Chest pain 12/04/2022   Palpitations 12/04/2022   Heart murmur 12/04/2022   Spotting 10/28/2022   Pelvic pressure in female 10/28/2022   Alopecia 10/09/2022   Skin tag 10/09/2022   Thrombosis superficial vein, arm, chronic, left 08/07/2022   Papilledema 07/25/2022   IIH (idiopathic intracranial hypertension) 07/25/2022   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   Mild sleep apnea 03/11/2022   Intracranial hypertension 03/01/2022  Osteochondral defect of talus 12/06/2021   Excessive daytime sleepiness 12/06/2021   H/O Cushing's syndrome 08/09/2021   At risk for impaired metabolic function 01/05/2021   Glucose intolerance (impaired glucose tolerance) 12/22/2020   Abnormal ECG 12/22/2020   Cushing's syndrome (HCC) 12/08/2020   Prolapsed internal hemorrhoids, grade 2 11/25/2020   Constipation 11/25/2020   Back pain with  radiculopathy 10/20/2020   Grade II hemorrhoids 10/13/2020   Rectal bleeding 08/19/2020   Plantar fasciitis 07/05/2020   Abnormal vaginal bleeding 04/07/2020   Microscopic hematuria 11/08/2019   Persistent proteinuria 11/08/2019   Nail discoloration 08/24/2019   Preeclampsia in postpartum period 06/06/2019   Gestational hypertension 05/29/2019   Chiari malformation type I (HCC) 11/27/2013   Common migraine 11/11/2013   Chronic nonintractable headache 07/09/2013   Vitamin D deficiency 03/06/2013   Prediabetes 03/06/2013   Gastroesophageal reflux disease 03/06/2013   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   Obesity (BMI 30.0-34.9) 07/07/2010   Cardiac dysrhythmia 07/07/2010   ANEMIA 06/23/2010    Allergies:  Allergies  Allergen Reactions   Penicillins Anaphylaxis, Rash and Other (See Comments)    Has patient had a PCN reaction causing immediate rash, facial/tongue/throat swelling, SOB or lightheadedness with hypotension: No Has patient had a PCN reaction causing severe rash involving mucus membranes or skin necrosis: No Has patient had a PCN reaction that required hospitalization No Has patient had a PCN reaction occurring within the last 10 years: No If all of the above answers are "NO", then may proceed with Cephalosporin use.   Wound Dressing Adhesive     Burns skin   Latex Rash   Neomycin Rash    Breaks out with neosporin   Medications:  Current Outpatient Medications:    erythromycin ophthalmic ointment, Place 1 Application into the right eye at bedtime for 5 days., Disp: 3.5 g, Rfl: 0   acetaZOLAMIDE (DIAMOX) 250 MG tablet, Take 1/2 pill in the morning and 1 pill at bedtime. If side effects can take all in the evening. Also try to get to 2 pills a day preferably one pill twice daily but could be all at night if can't take pill in day due to side effects., Disp: 180 tablet, Rfl: 4   Calcium Carb-Cholecalciferol 500-10 MG-MCG TABS, Take 1 tablet by mouth in the  morning, at noon, and at bedtime., Disp: , Rfl:    clotrimazole-betamethasone (LOTRISONE) cream, Apply 1 Application topically 2 (two) times daily., Disp: 45 g, Rfl: 1   furosemide (LASIX) 20 MG tablet, Take 1 tablet (20 mg total) by mouth daily., Disp: 30 tablet, Rfl: 2   Multiple Vitamin (MULTIVITAMIN) tablet, Take 1 tablet by mouth daily., Disp: , Rfl:    pantoprazole (PROTONIX) 40 MG tablet, TAKE 1 TABLET BY MOUTH ONCE daily AS NEEDED FOR heartburn, Disp: 30 tablet, Rfl: 6   prenatal vitamin w/FE, FA (PRENATAL 1 + 1) 27-1 MG TABS tablet, Take 1 tablet by mouth daily at 12 noon., Disp: 30 tablet, Rfl: 12   Vitamin D, Ergocalciferol, (DRISDOL) 1.25 MG (50000 UNIT) CAPS capsule, TAKE ONE CAPSULE BY MOUTH ONCE WEEKLY ON WEDNESDAY AND ONCE WEEKLY ON SUNDAY, Disp: 8 capsule, Rfl: 6 No current facility-administered medications for this visit.  Facility-Administered Medications Ordered in Other Visits:    ondansetron (ZOFRAN-ODT) disintegrating tablet 4 mg, 4 mg, Oral, Q4H PRN **OR** ondansetron (ZOFRAN) 4 mg in sodium chloride 0.9 % 50 mL IVPB, 4 mg, Intravenous, Q4H PRN, Stechschulte, Hyman Hopes, MD   sodium chloride 0.9 % 1,000 mL with  thiamine 100 mg, folic acid 1 mg, M.V.I. Adult 10 mL infusion, , Intravenous, Once, Stechschulte, Hyman Hopes, MD   sodium chloride 0.9 % bolus 1,000 mL, 1,000 mL, Intravenous, Once, Stechschulte, Hyman Hopes, MD  Observations/Objective: Patient is well-developed, well-nourished in no acute distress.  Resting comfortably at home.  Head is normocephalic, atraumatic.  No labored breathing.  Speech is clear and coherent with logical content.  Patient is alert and oriented at baseline.    Assessment and Plan: 1. Acute right eye pain (Primary)  Will send in antibiotic ointment per patient request, this was prescribed previously by ophthalmologist when this occurred previously.  Supportive care discussed. ED precautions given.   Follow Up Instructions: I discussed the  assessment and treatment plan with the patient. The patient was provided an opportunity to ask questions and all were answered. The patient agreed with the plan and demonstrated an understanding of the instructions.  A copy of instructions were sent to the patient via MyChart unless otherwise noted below.     The patient was advised to call back or seek an in-person evaluation if the symptoms worsen or if the condition fails to improve as anticipated.    Janet Fantasia Ward, PA-C

## 2023-10-03 NOTE — Patient Instructions (Addendum)
Janet [name], thank you for joining Tylene Fantasia Ward, PA-C for today's virtual visit.  While this provider is not your primary care provider (PCP), if your PCP is located in our provider database this encounter information will be shared with them immediately following your visit.   A [facility] MyChart account gives you access to today's visit and all your visits, tests, and labs performed at [facility] " click here if you don't have a [facility] MyChart account or go to [email or web address]  Consent: (Patient) Janet [name] provided verbal consent for this virtual visit at the beginning of the encounter.  Current Medications:  Current Outpatient Medications:    erythromycin ophthalmic ointment, Place 1 Application into the right eye at bedtime for 5 days., Disp: 3.5 g, Rfl: 0   acetaZOLAMIDE (DIAMOX) 250 MG tablet, Take 1/2 pill in the morning and 1 pill at bedtime. If side effects can take all in the evening. Also try to get to 2 pills a day preferably one pill twice daily but could be all at night if can't take pill in day due to side effects., Disp: 180 tablet, Rfl: 4   Calcium Carb-Cholecalciferol 500-10 MG-MCG TABS, Take 1 tablet by mouth in the morning, at noon, and at bedtime., Disp: , Rfl:    clotrimazole-betamethasone (LOTRISONE) cream, Apply 1 Application topically 2 (two) times daily., Disp: 45 g, Rfl: 1   furosemide (LASIX) 20 MG tablet, Take 1 tablet (20 mg total) by mouth daily., Disp: 30 tablet, Rfl: 2   Multiple Vitamin (MULTIVITAMIN) tablet, Take 1 tablet by mouth daily., Disp: , Rfl:    pantoprazole (PROTONIX) 40 MG tablet, TAKE 1 TABLET BY MOUTH ONCE daily AS NEEDED FOR heartburn, Disp: 30 tablet, Rfl: 6   prenatal vitamin w/FE, FA (PRENATAL 1 + 1) 27-1 MG TABS tablet, Take 1 tablet by mouth daily at 12 noon., Disp: 30 tablet, Rfl: 12   Vitamin D, Ergocalciferol, (DRISDOL) 1.25 MG (50000 UNIT) CAPS capsule, TAKE ONE CAPSULE BY MOUTH ONCE WEEKLY ON  WEDNESDAY AND ONCE WEEKLY ON SUNDAY, Disp: 8 capsule, Rfl: 6 No current facility-administered medications for this visit.  Facility-Administered Medications Ordered in Other Visits:    ondansetron (ZOFRAN-ODT) disintegrating tablet 4 mg, 4 mg, Oral, Q4H PRN **OR** ondansetron (ZOFRAN) 4 mg in sodium chloride 0.9 % 50 mL IVPB, 4 mg, Intravenous, Q4H PRN, Stechschulte, Hyman Hopes, MD   sodium chloride 0.9 % 1,000 mL with thiamine 100 mg, folic acid 1 mg, M.V.I. Adult 10 mL infusion, , Intravenous, Once, Stechschulte, Hyman Hopes, MD   sodium chloride 0.9 % bolus 1,000 mL, 1,000 mL, Intravenous, Once, Stechschulte, Hyman Hopes, MD   Medications ordered in this encounter:  Meds ordered this encounter  Medications   erythromycin ophthalmic ointment    Sig: Place 1 Application into the right eye at bedtime for 5 days.    Dispense:  3.5 g    Refill:  0    Supervising Provider:   Merrilee Jansky [[id number]]     *If you need refills on other medications prior to your next appointment, please contact your pharmacy*  Follow-Up: Call back or seek an in-person evaluation if the symptoms worsen or if the condition fails to improve as anticipated.  [facility] [phone]  Other Instructions  If no improvement follow up with ophthalmologist.  If symptoms are severe while their office is closed recommend ED visit.   If you have been instructed to have an in-person evaluation today  at a local Urgent Care facility, please use the link below. It will take you to a list of all of our available [facility], including address, phone number and hours of operation. Please do not delay care.  [facility]  If you or a family member do not have a primary care provider, use the link below to schedule a visit and establish care. When you choose a [facility] primary care physician or advanced practice provider, you gain a long-term partner in health. Find a Primary Care  Provider  Learn more about [facility]'s in-office and virtual care options: [facility] - Get Care Now

## 2023-10-03 NOTE — Progress Notes (Signed)
Because It does not sound like a stye, but pain and visual changes from trauma, I feel your condition warrants further evaluation and I recommend that you be seen in a face to face visit and is not appropriate for an evisit.    NOTE: There will be NO CHARGE for this eVisit   If you are having a true medical emergency please call 911.      For an urgent face to face visit, [facility] has eight urgent care centers for your convenience:   NEW!! [facility] Center at [facility] Get Driving Directions [phone] [address] [place], [address]    [facility] Health Urgent Care Center at [facility] Get Driving Directions [phone] [address] [place], [place] [address]   [facility]) Get Driving Directions [phone] [address] [place], [place] [address]  [facility] Health Urgent Care Center [facility]) Get Driving Directions [phone] [address] [place],  [place]  [address]  [facility] Care Center [facility] - at [employer]  [phone] [address] [place],  [place] [address]   [facility] Health Urgent Care at [facility] Get Driving Directions [phone] [address] [place], [place] [address]   [facility] Health Urgent Care at [facility] Get Driving Directions  [phone] [address] [place], [place] [address]   [facility] Health Urgent Care at [facility] Directions [phone] [address] [place], [place] [address]  Your MyChart E-visit questionnaire answers were reviewed by a board certified advanced clinical practitioner to complete your personal care plan based on your specific symptoms.  Thank you for using e-Visits.  I have spent 5 minutes in review of e-visit questionnaire, review and updating patient chart, medical decision making and  response to patient.   Tylene Fantasia Ward, PA-C

## 2023-11-07 ENCOUNTER — Encounter: Payer: Self-pay | Admitting: Family Medicine

## 2023-11-07 ENCOUNTER — Ambulatory Visit: Payer: 59 | Admitting: Family Medicine

## 2023-11-07 VITALS — BP 142/91 | HR 82 | Ht 64.0 in | Wt 173.1 lb

## 2023-11-07 DIAGNOSIS — R030 Elevated blood-pressure reading, without diagnosis of hypertension: Secondary | ICD-10-CM | POA: Diagnosis not present

## 2023-11-07 DIAGNOSIS — E663 Overweight: Secondary | ICD-10-CM

## 2023-11-07 DIAGNOSIS — R7303 Prediabetes: Secondary | ICD-10-CM

## 2023-11-07 DIAGNOSIS — G932 Benign intracranial hypertension: Secondary | ICD-10-CM

## 2023-11-07 NOTE — Patient Instructions (Addendum)
   F/U in 4 months, call if you need me sooner  Pls get fasting labs ordered in November as soon as possible  All the best  Pls consider reduced work schedule or time off till feeling better  Thanks for choosing [facility], we consider it a privelige to serve you.

## 2023-11-13 NOTE — Assessment & Plan Note (Signed)
Patient educated about the importance of limiting  Carbohydrate intake , the need to commit to daily physical activity for a minimum of 30 minutes , and to commit weight loss. The fact that changes in all these areas will reduce or eliminate all together the development of diabetes is stressed.      Latest Ref Rng & Units 05/27/2023    4:50 PM 12/16/2022    4:00 PM 12/01/2022    7:14 PM 10/13/2022    1:53 PM 08/12/2022   11:20 AM  Diabetic Labs  Creatinine 0.44 - 1.00 mg/dL 1.30  8.65  7.84  6.96  0.80   GFR >60.00 mL/min    112.01  94.54       11/07/2023    3:56 PM 11/07/2023    3:54 PM 09/19/2023    1:04 PM 08/15/2023   11:17 AM 07/04/2023    4:00 PM 07/04/2023    3:24 PM 07/04/2023    3:22 PM  BP/Weight  Systolic BP 142 150 133 132 138 125 142  Diastolic BP 91 97 85 91 86 84 84  Wt. (Lbs)  173.08 176 174.12   177.08  BMI  29.71 kg/m2 30.21 kg/m2 29.89 kg/m2   30.4 kg/m2       No data to display          Updated lab needed at/ before next visit.

## 2023-11-13 NOTE — Progress Notes (Signed)
Janet [name]     MRN: [id number]      DOB: 08/09/1986  Chief Complaint  Patient presents with   Follow-up    Follow up procedure tomorrow not allowed to take medications     HPI Janet [name] is here for follow up and re-evaluation of chronic medical conditions, medication management and review of any available recent lab and radiology data.  Preventive health is updated, specifically  Cancer screening and Immunization.   Questions or concerns regarding consultations or procedures which the PT has had in the interim are  addressed. Daily disabling headaches , not improved, has venogram in am  ROS Denies recent fever or chills. Denies sinus pressure, nasal congestion, ear pain or sore throat. Denies chest congestion, productive cough or wheezing. Denies chest pains, palpitations and leg swelling Denies abdominal pain, nausea, vomiting,diarrhea or constipation.   Denies dysuria, frequency, hesitancy or incontinence. Denies significant  joint pain, swelling and limitation in mobility. Denies skin break down or rash.   PE  BP (!) 142/91 (BP Location: Left Arm, Patient Position: Sitting, Cuff Size: Large)   Pulse 82   Ht 5\' 4"  (1.626 m)   Wt 173 lb 1.3 oz (78.5 kg)   SpO2 98%   BMI 29.71 kg/m   Patient alert and oriented and in no cardiopulmonary distress.Pt in pain  HEENT: No facial asymmetry, EOMI,     Neck decreased ROM.  Chest: Clear to auscultation bilaterally.  CVS: S1, S2 no murmurs, no S3.Regular rate.   Ext: No edema  MS: Adequate ROM spine, shoulders, hips and knees.  Skin: Intact, no ulcerations or rash noted.  Psych: Good eye contact, Memory intact mildly  anxious and  depressed appearing.  CNS: CN 2-12 intact, power,  normal throughout.no focal deficits noted.   Assessment & Plan  IIH (idiopathic intracranial hypertension) Daily disabling headaches , rated at 10, has IR venogram scheduled in am, hoping for symptom improvement  Overweight (BMI  25.0-29.9)  Patient re-educated about  the importance of commitment to a  minimum of 150 minutes of exercise per week as able.  The importance of healthy food choices with portion control discussed, as well as eating regularly and within a 12 hour window most days. The need to choose "clean , green" food 50 to 75% of the time is discussed, as well as to make water the primary drink and set a goal of 64 ounces water daily.       11/07/2023    3:54 PM 09/19/2023    1:04 PM 08/15/2023   11:17 AM  Weight /BMI  Weight 173 lb 1.3 oz 176 lb 174 lb 1.9 oz  Height 5\' 4"  (1.626 m) 5\' 4"  (1.626 m) 5\' 4"  (1.626 m)  BMI 29.71 kg/m2 30.21 kg/m2 29.89 kg/m2   Weight stable , generally takes metformin but currently on hold   Elevated blood pressure reading without diagnosis of hypertension Pt reports normal BP outside of the office and has not been taking meds regularly DASH diet and commitment to daily physical activity for a minimum of 30 minutes discussed and encouraged, as a part of hypertension management. The importance of attaining a healthy weight is also discussed.     11/07/2023    3:56 PM 11/07/2023    3:54 PM 09/19/2023    1:04 PM 08/15/2023   11:17 AM 07/04/2023    4:00 PM 07/04/2023    3:24 PM 07/04/2023    3:22 PM  BP/Weight  Systolic BP 142  150 133 132 138 125 142  Diastolic BP 91 97 85 91 86 84 84  Wt. (Lbs)  173.08 176 174.12   177.08  BMI  29.71 kg/m2 30.21 kg/m2 29.89 kg/m2   30.4 kg/m2     Will re eval in 4 months  Prediabetes Patient educated about the importance of limiting  Carbohydrate intake , the need to commit to daily physical activity for a minimum of 30 minutes , and to commit weight loss. The fact that changes in all these areas will reduce or eliminate all together the development of diabetes is stressed.      Latest Ref Rng & Units 05/27/2023    4:50 PM 12/16/2022    4:00 PM 12/01/2022    7:14 PM 10/13/2022    1:53 PM 08/12/2022   11:20 AM  Diabetic Labs   Creatinine 0.44 - 1.00 mg/dL 4.13  2.44  0.10  2.72  0.80   GFR >60.00 mL/min    112.01  94.54       11/07/2023    3:56 PM 11/07/2023    3:54 PM 09/19/2023    1:04 PM 08/15/2023   11:17 AM 07/04/2023    4:00 PM 07/04/2023    3:24 PM 07/04/2023    3:22 PM  BP/Weight  Systolic BP 142 150 133 132 138 125 142  Diastolic BP 91 97 85 91 86 84 84  Wt. (Lbs)  173.08 176 174.12   177.08  BMI  29.71 kg/m2 30.21 kg/m2 29.89 kg/m2   30.4 kg/m2       No data to display          Updated lab needed at/ before next visit.

## 2023-11-13 NOTE — Assessment & Plan Note (Signed)
Pt reports normal BP outside of the office and has not been taking meds regularly DASH diet and commitment to daily physical activity for a minimum of 30 minutes discussed and encouraged, as a part of hypertension management. The importance of attaining a healthy weight is also discussed.     11/07/2023    3:56 PM 11/07/2023    3:54 PM 09/19/2023    1:04 PM 08/15/2023   11:17 AM 07/04/2023    4:00 PM 07/04/2023    3:24 PM 07/04/2023    3:22 PM  BP/Weight  Systolic BP 142 150 133 132 138 125 142  Diastolic BP 91 97 85 91 86 84 84  Wt. (Lbs)  173.08 176 174.12   177.08  BMI  29.71 kg/m2 30.21 kg/m2 29.89 kg/m2   30.4 kg/m2     Will re eval in 4 months

## 2023-11-13 NOTE — Assessment & Plan Note (Signed)
Daily disabling headaches , rated at 10, has IR venogram scheduled in am, hoping for symptom improvement

## 2023-11-13 NOTE — Assessment & Plan Note (Signed)
  Patient re-educated about  the importance of commitment to a  minimum of 150 minutes of exercise per week as able.  The importance of healthy food choices with portion control discussed, as well as eating regularly and within a 12 hour window most days. The need to choose "clean , green" food 50 to 75% of the time is discussed, as well as to make water the primary drink and set a goal of 64 ounces water daily.       11/07/2023    3:54 PM 09/19/2023    1:04 PM 08/15/2023   11:17 AM  Weight /BMI  Weight 173 lb 1.3 oz 176 lb 174 lb 1.9 oz  Height 5\' 4"  (1.626 m) 5\' 4"  (1.626 m) 5\' 4"  (1.626 m)  BMI 29.71 kg/m2 30.21 kg/m2 29.89 kg/m2   Weight stable , generally takes metformin but currently on hold

## 2023-12-14 ENCOUNTER — Telehealth: Payer: Self-pay | Admitting: Family Medicine

## 2023-12-14 ENCOUNTER — Telehealth: Admitting: Family Medicine

## 2023-12-14 DIAGNOSIS — M549 Dorsalgia, unspecified: Secondary | ICD-10-CM

## 2023-12-14 MED ORDER — CYCLOBENZAPRINE HCL 10 MG PO TABS: 10.0000 mg | ORAL_TABLET | Freq: Three times a day (TID) | ORAL | 0 refills | Status: DC | PRN

## 2023-12-14 MED ORDER — NAPROXEN 500 MG PO TABS: 500.0000 mg | ORAL_TABLET | Freq: Two times a day (BID) | ORAL | 0 refills | Status: DC

## 2023-12-14 NOTE — Telephone Encounter (Unsigned)
 Copied from CRM [id number]. Topic: Appointments - Scheduling Inquiry for Clinic >> Dec 14, 2023  8:11 AM Gildardo Pounds wrote: Reason for CRM: Patient is experiencing head and back pain. She would like to get an appointment as soon as possible. Please reach out to the patient if an appointment becomes available today or tomorrow. Callback number is [phone].

## 2023-12-14 NOTE — Telephone Encounter (Signed)
 Pt says can just put her anywhere

## 2023-12-14 NOTE — Progress Notes (Signed)
 E-Visit for Back Pain   We are sorry that you are not feeling well.  Here is how we plan to help!  Based on what you have shared with me it looks like you mostly have acute back pain.  Acute back pain is defined as musculoskeletal pain that can resolve in 1-3 weeks with conservative treatment.  I have prescribed Naprosyn 500 mg take one by mouth twice a day non-steroid anti-inflammatory (NSAID) as well as Flexeril 10 mg every eight hours as needed which is a muscle relaxer  Some patients experience stomach irritation or in increased heartburn with anti-inflammatory drugs.  Please keep in mind that muscle relaxer's can cause fatigue and should not be taken while at work or driving.  Back pain is very common.  The pain often gets better over time.  The cause of back pain is usually not dangerous.  Most people can learn to manage their back pain on their own.  Home Care Stay active.  Start with short walks on flat ground if you can.  Try to walk farther each day. Do not sit, drive or stand in one place for more than 30 minutes.  Do not stay in bed. Do not avoid exercise or work.  Activity can help your back heal faster. Be careful when you bend or lift an object.  Bend at your knees, keep the object close to you, and do not twist. Sleep on a firm mattress.  Lie on your side, and bend your knees.  If you lie on your back, put a pillow under your knees. Only take medicines as told by your doctor. Put ice on the injured area. Put ice in a plastic bag Place a towel between your skin and the bag Leave the ice on for 15-20 minutes, 3-4 times a day for the first 2-3 days. 210 After that, you can switch between ice and heat packs. Ask your doctor about back exercises or massage. Avoid feeling anxious or stressed.  Find good ways to deal with stress, such as exercise.  Get Help Right Way If: Your pain does not go away with rest or medicine. Your pain does not go away in 1 week. You have new  problems. You do not feel well. The pain spreads into your legs. You cannot control when you poop (bowel movement) or pee (urinate) You feel sick to your stomach (nauseous) or throw up (vomit) You have belly (abdominal) pain. You feel like you may pass out (faint). If you develop a fever.  Make Sure you: Understand these instructions. Will watch your condition Will get help right away if you are not doing well or get worse.  Your e-visit answers were reviewed by a board certified advanced clinical practitioner to complete your personal care plan.  Depending on the condition, your plan could have included both over the counter or prescription medications.  If there is a problem please reply  once you have received a response from your provider.  Your safety is important to [place].  If you have drug allergies check your prescription carefully.    You can use MyChart to ask questions about today's visit, request a non-urgent call back, or ask for a work or school excuse for 24 hours related to this e-Visit. If it has been greater than 24 hours you will need to follow up with your provider, or enter a new e-Visit to address those concerns.  You will get an e-mail in the next two days asking about  your experience.  I hope that your e-visit has been valuable and will speed your recovery. Thank you for using e-visits.    have provided 5 minutes of non face to face time during this encounter for chart review and documentation.

## 2023-12-14 NOTE — Telephone Encounter (Signed)
 LVM to put pt in at 9:40 with Malachi Bonds tomorrow not sure if it will be available long

## 2023-12-15 ENCOUNTER — Encounter: Payer: Self-pay | Admitting: Internal Medicine

## 2023-12-15 ENCOUNTER — Ambulatory Visit (INDEPENDENT_AMBULATORY_CARE_PROVIDER_SITE_OTHER): Admitting: Internal Medicine

## 2023-12-15 VITALS — BP 128/86 | HR 81 | Ht 63.0 in | Wt 179.0 lb

## 2023-12-15 DIAGNOSIS — M549 Dorsalgia, unspecified: Secondary | ICD-10-CM | POA: Diagnosis not present

## 2023-12-15 DIAGNOSIS — G932 Benign intracranial hypertension: Secondary | ICD-10-CM | POA: Diagnosis not present

## 2023-12-15 NOTE — Patient Instructions (Signed)
 Please take half tablet of Flexeril as needed for scapular area pain/muscle spasms.  Please take Naproxen only once daily with food for short-term. Okay to alternate with Tylenol for pain.

## 2023-12-15 NOTE — Progress Notes (Signed)
 Acute Office Visit  Subjective:    Patient ID: Janet [name], female    DOB: November 09, 1985, 38 y.o.   MRN: [id number]  Chief Complaint  Patient presents with   Back Pain    Back pain in the middle of her back , did an E visit yesterday, and was prescribed a muscle relaxer    Headache    More frequent headaches    HPI Patient is in today for complaint of acute mid back pain, around scapular area since 12/12/23.  Pain is constant, dull, radiating towards left scapular lateral border and is worse with lying down.  Denies any recent injury or heavy lifting.  Denies any recent cough, dyspnea or chest pain.  She tried taking Flexeril 10 mg last evening, which helped her to rest, but felt drowsy in the morning.  She was also given naproxen by e-visit provider, but she tries to avoid NSAID due to her history of gastric bypass.  She reports more frequent headaches recently.  She has history of IIH, but did not tolerate Diamox.  Her neurosurgeon has prescribed Topamax, which she has not been able to start yet.  Past Medical History:  Diagnosis Date   Adrenal tumor    Anemia    Phreesia 08/12/2020   Arthritis    Borderline diabetes 2011   r/t adrenal tumor, now resolved   Carpal tunnel syndrome during pregnancy    Chiari malformation type I (HCC) 2015   on MRI   Cushing's syndrome (HCC) 11/27/2013   Gallbladder problem    Gastroesophageal reflux disease    GERD (gastroesophageal reflux disease)    Phreesia 08/12/2020   Hematuria 09/18/2015   Hemorrhoids    History of Chiari malformation    Hypertension    Phreesia 08/12/2020   Internal hemorrhoids with other complication 12/27/2013   JAN 2015 R ANT MAR 2015 R POS AND L LAT BAND    Menstrual periods irregular 09/18/2015   Migraine without aura, with intractable migraine, so stated, without mention of status migrainosus 11/11/2013   Plantar fasciitis    Prediabetes    Preeclampsia 05/21/2019   2x/wk testing nst alt w/ bpp/dopp       Deliver @ 37wks        IOL scheduled for 8/19 @ 0830   Sleep apnea    Vitamin D deficiency     Past Surgical History:  Procedure Laterality Date   arthroscopic treatment of osteochondral defect of right ankle  03/2021   Bilateral plantar fascial Topaz  03/2021   CHOLECYSTECTOMY  02/2015   COLONOSCOPY N/A 04/19/2013   WUJ:WJXBJY mucosa in the terminal ileum/Single erosion in transverse colon/RECTAL BLEEDING DUE TO Moderate sized internal hemorrhoids/ trv colon erosion, bx benign.   COLONOSCOPY WITH PROPOFOL N/A 08/31/2020   Non-bleeding internal hemorrhoids. Colon torturous.    ESOPHAGOGASTRODUODENOSCOPY N/A 04/19/2013   NWG:NFAO Non-erosive gastritis/PERI-UMBILICAL PAIN DUE TO GERD, GASTRITIS, AND CONSTIPATION. Bx with mild chronic inactive gastritis. No H.Pylori   GASTRIC ROUX-EN-Y N/A 06/21/2022   Procedure: LAPAROSCOPIC ROUX-EN-Y GASTRIC BYPASS WITH UPPER ENDOSCOPY WITH HIATAL HERNIA REPAIR;  Surgeon: Gaynelle Adu, MD;  Location: [facility];  Service: General;  Laterality: N/A;   HEMORRHOID BANDING  2015   Dr. Jena Gauss- in office banding procedure   LAPAROSCOPIC ADRENALECTOMY  10/28/2010   TONSILLECTOMY     tumor removed left adrenal gland 01/12      Family History  Problem Relation Age of Onset   Hypertension Mother    Hyperlipidemia Mother  Colon polyps Mother    Hypertension Father    Colon polyps Father    Sleep apnea Father    Obesity Father    Deep vein thrombosis Sister    Hypertension Maternal Grandmother    Diabetes Maternal Grandmother    Deep vein thrombosis Maternal Grandmother    Diabetes Paternal Grandmother    COPD Paternal Grandfather    Heart disease Paternal Grandfather    Other Daughter        pulmonary valve stenosis   Stroke Maternal Aunt    Migraines Maternal Aunt    Colon cancer Other        maternal great uncle    Social History   Socioeconomic History   Marital status: Single    Spouse name: Not on file   Number of children: 1   Years of  education: BA   Highest education level: Master's degree (e.g., MA, MS, MEng, MEd, MSW, MBA)  Occupational History   Occupation: full time IT sales professional: [employer]  Tobacco Use   Smoking status: Never   Smokeless tobacco: Never  Vaping Use   Vaping status: Never Used  Substance and Sexual Activity   Alcohol use: No   Drug use: No   Sexual activity: Yes    Birth control/protection: Surgical    Comment: vasectomy  Other Topics Concern   Not on file  Social History Narrative   Lives with spouse and 2 children   Right Handed   Drinks no caffeine   Social Drivers of Corporate investment banker Strain: Low Risk  (11/26/2023)   Received from [facility]   Overall Financial Resource Strain (CARDIA)    Difficulty of Paying Living Expenses: Not hard at all  Food Insecurity: No Food Insecurity (11/26/2023)   Received from [facility]   Hunger Vital Sign    Worried About Running Out of Food in the Last Year: Never true    Ran Out of Food in the Last Year: Never true  Transportation Needs: No Transportation Needs (11/26/2023)   Received from [facility] - Transportation    Lack of Transportation (Medical): No    Lack of Transportation (Non-Medical): No  Physical Activity: Sufficiently Active (11/26/2023)   Received from [facility]   Exercise Vital Sign    Days of Exercise per Week: 5 days    Minutes of Exercise per Session: 60 min  Stress: No Stress Concern Present (11/26/2023)   Received from [facility] - Occupational Stress Questionnaire    Feeling of Stress : Not at all  Social Connections: Socially Integrated (11/26/2023)   Received from [facility]   Social Network    How would you rate your social network (family, work, friends)?: Good participation with social networks  Intimate Partner Violence: Not At Risk (11/26/2023)   Received from [facility]   HITS    Over the last 12 months how often  did your partner physically hurt you?: Never    Over the last 12 months how often did your partner insult you or talk down to you?: Never    Over the last 12 months how often did your partner threaten you with physical harm?: Never    Over the last 12 months how often did your partner scream or curse at you?: Never    Outpatient Medications Prior to Visit  Medication Sig Dispense Refill   acetaZOLAMIDE (DIAMOX) 250 MG tablet Take  1/2 pill in the morning and 1 pill at bedtime. If side effects can take all in the evening. Also try to get to 2 pills a day preferably one pill twice daily but could be all at night if can't take pill in day due to side effects. 180 tablet 4   Calcium Carb-Cholecalciferol 500-10 MG-MCG TABS Take 1 tablet by mouth in the morning, at noon, and at bedtime.     clotrimazole-betamethasone (LOTRISONE) cream Apply 1 Application topically 2 (two) times daily. 45 g 1   cyclobenzaprine (FLEXERIL) 10 MG tablet Take 1 tablet (10 mg total) by mouth 3 (three) times daily as needed for muscle spasms. 30 tablet 0   Multiple Vitamin (MULTIVITAMIN) tablet Take 1 tablet by mouth daily.     naproxen (NAPROSYN) 500 MG tablet Take 1 tablet (500 mg total) by mouth 2 (two) times daily with a meal. 30 tablet 0   pantoprazole (PROTONIX) 40 MG tablet TAKE 1 TABLET BY MOUTH ONCE daily AS NEEDED FOR heartburn 30 tablet 6   Vitamin D, Ergocalciferol, (DRISDOL) 1.25 MG (50000 UNIT) CAPS capsule TAKE ONE CAPSULE BY MOUTH ONCE WEEKLY ON WEDNESDAY AND ONCE WEEKLY ON SUNDAY 8 capsule 6   Facility-Administered Medications Prior to Visit  Medication Dose Route Frequency Provider Last Rate Last Admin   ondansetron (ZOFRAN-ODT) disintegrating tablet 4 mg  4 mg Oral Q4H PRN Stechschulte, Hyman Hopes, MD       Or   ondansetron (ZOFRAN) 4 mg in sodium chloride 0.9 % 50 mL IVPB  4 mg Intravenous Q4H PRN Stechschulte, Hyman Hopes, MD       sodium chloride 0.9 % 1,000 mL with thiamine 100 mg, folic acid 1 mg, M.V.I. Adult  10 mL infusion   Intravenous Once Stechschulte, Hyman Hopes, MD       sodium chloride 0.9 % bolus 1,000 mL  1,000 mL Intravenous Once Stechschulte, Hyman Hopes, MD        Allergies  Allergen Reactions   Penicillins Anaphylaxis, Rash and Other (See Comments)    Has patient had a PCN reaction causing immediate rash, facial/tongue/throat swelling, SOB or lightheadedness with hypotension: No Has patient had a PCN reaction causing severe rash involving mucus membranes or skin necrosis: No Has patient had a PCN reaction that required hospitalization No Has patient had a PCN reaction occurring within the last 10 years: No If all of the above answers are "NO", then may proceed with Cephalosporin use.   Wound Dressing Adhesive     Burns skin   Latex Rash   Neomycin Rash    Breaks out with neosporin    Review of Systems  Constitutional:  Negative for chills and fever.  HENT:  Negative for congestion, sinus pressure, sinus pain and sore throat.   Eyes:  Negative for pain and discharge.  Respiratory:  Negative for cough and shortness of breath.   Cardiovascular:  Negative for chest pain and palpitations.  Gastrointestinal:  Negative for diarrhea, nausea and vomiting.  Endocrine: Negative for polydipsia and polyuria.  Genitourinary:  Negative for dysuria and hematuria.  Musculoskeletal:  Positive for back pain. Negative for neck pain and neck stiffness.  Skin:  Negative for rash.  Neurological:  Positive for headaches. Negative for dizziness and weakness.  Psychiatric/Behavioral:  Negative for agitation and behavioral problems.        Objective:    Physical Exam Constitutional:      General: She is not in acute distress.    Appearance: She is not  diaphoretic.  HENT:     Head: Normocephalic and atraumatic.  Eyes:     General: No scleral icterus.    Extraocular Movements: Extraocular movements intact.  Cardiovascular:     Rate and Rhythm: Normal rate and regular rhythm.     Heart sounds:  Normal heart sounds. No murmur heard. Pulmonary:     Breath sounds: Normal breath sounds. No wheezing or rales.  Musculoskeletal:     Thoracic back: Tenderness (Left paraspinal) present.  Skin:    General: Skin is warm.     Findings: No rash.  Neurological:     General: No focal deficit present.     Mental Status: She is alert and oriented to person, place, and time.  Psychiatric:        Mood and Affect: Mood normal.        Behavior: Behavior normal.     BP 128/86 (BP Location: Left Arm, Patient Position: Sitting, Cuff Size: Normal)   Pulse 81   Ht 5\' 3"  (1.6 m)   Wt 179 lb (81.2 kg)   SpO2 99%   BMI 31.71 kg/m  Wt Readings from Last 3 Encounters:  12/15/23 179 lb (81.2 kg)  11/07/23 173 lb 1.3 oz (78.5 kg)  09/19/23 176 lb (79.8 kg)        Assessment & Plan:   Problem List Items Addressed This Visit       Nervous and Auditory   IIH (idiopathic intracranial hypertension)   Her headaches are likely due to IIH Recent neurosurgery visit note reviewed - plan to start Topamax Did not tolerate Diamox        Other   Acute mid back pain - Primary   Likely due to muscular strain around scapular area Advised to take Flexeril 5 mg instead of 10 mg to avoid drowsiness Can take naproxen for short-term for back pain Avoid overhead heavy lifting Can apply pain relieving cream locally as well such as Icyhot or Bengay        No orders of the defined types were placed in this encounter.    Anabel Halon, MD

## 2023-12-15 NOTE — Assessment & Plan Note (Signed)
 Her headaches are likely due to IIH Recent neurosurgery visit note reviewed - plan to start Topamax Did not tolerate Diamox

## 2023-12-15 NOTE — Assessment & Plan Note (Signed)
 Likely due to muscular strain around scapular area Advised to take Flexeril 5 mg instead of 10 mg to avoid drowsiness Can take naproxen for short-term for back pain Avoid overhead heavy lifting Can apply pain relieving cream locally as well such as Icyhot or [employer]

## 2023-12-27 ENCOUNTER — Encounter: Payer: Self-pay | Admitting: Family Medicine

## 2023-12-27 ENCOUNTER — Ambulatory Visit: Admitting: Allergy & Immunology

## 2023-12-28 MED ORDER — LORATADINE 10 MG PO TABS: 10.0000 mg | ORAL_TABLET | Freq: Every day | ORAL | 1 refills | Status: DC

## 2024-01-10 ENCOUNTER — Other Ambulatory Visit: Payer: Self-pay | Admitting: Family Medicine

## 2024-01-11 ENCOUNTER — Other Ambulatory Visit: Payer: Self-pay | Admitting: Family Medicine

## 2024-01-11 DIAGNOSIS — E559 Vitamin D deficiency, unspecified: Secondary | ICD-10-CM

## 2024-01-16 ENCOUNTER — Other Ambulatory Visit (HOSPITAL_COMMUNITY): Payer: Self-pay | Admitting: Surgery

## 2024-01-16 DIAGNOSIS — R160 Hepatomegaly, not elsewhere classified: Secondary | ICD-10-CM

## 2024-01-16 DIAGNOSIS — K7689 Other specified diseases of liver: Secondary | ICD-10-CM

## 2024-01-19 ENCOUNTER — Ambulatory Visit: Admitting: Allergy & Immunology

## 2024-01-24 ENCOUNTER — Encounter (HOSPITAL_COMMUNITY): Payer: Self-pay | Admitting: *Deleted

## 2024-01-27 ENCOUNTER — Ambulatory Visit (HOSPITAL_COMMUNITY)
Admission: RE | Admit: 2024-01-27 | Discharge: 2024-01-27 | Disposition: A | Discharge status: Home / Self Care | Source: Ambulatory Visit | Attending: Surgery | Admitting: Surgery

## 2024-01-27 DIAGNOSIS — R160 Hepatomegaly, not elsewhere classified: Secondary | ICD-10-CM | POA: Diagnosis present

## 2024-01-27 DIAGNOSIS — K7689 Other specified diseases of liver: Secondary | ICD-10-CM | POA: Insufficient documentation

## 2024-01-27 MED ORDER — GADOXETATE DISODIUM 0.25 MMOL/ML IV SOLN
8.0000 mL | Freq: Once | INTRAVENOUS | Status: AC | PRN
  Administered 2024-01-27: 8 mL via INTRAVENOUS

## 2024-02-16 ENCOUNTER — Other Ambulatory Visit: Payer: Self-pay

## 2024-02-16 ENCOUNTER — Ambulatory Visit (INDEPENDENT_AMBULATORY_CARE_PROVIDER_SITE_OTHER): Admitting: Allergy & Immunology

## 2024-02-16 ENCOUNTER — Encounter: Payer: Self-pay | Admitting: Allergy & Immunology

## 2024-02-16 VITALS — BP 118/64 | HR 92 | Temp 99.1°F | Resp 18 | Ht 65.0 in | Wt 176.4 lb

## 2024-02-16 DIAGNOSIS — R220 Localized swelling, mass and lump, head: Secondary | ICD-10-CM

## 2024-02-16 DIAGNOSIS — T7801XD Anaphylactic reaction due to peanuts, subsequent encounter: Secondary | ICD-10-CM | POA: Diagnosis not present

## 2024-02-16 DIAGNOSIS — J310 Chronic rhinitis: Secondary | ICD-10-CM | POA: Diagnosis not present

## 2024-02-16 DIAGNOSIS — R221 Localized swelling, mass and lump, neck: Secondary | ICD-10-CM

## 2024-02-16 DIAGNOSIS — L239 Allergic contact dermatitis, unspecified cause: Secondary | ICD-10-CM

## 2024-02-16 NOTE — Patient Instructions (Addendum)
 1. Food induced anaphylaxis/lip tingling - We will do some testing to peanut for sure and major the other major food allergens are negative. - Food sheet provided so you can see what else we can test for.  2. Environmental allergies - Because of insurance stipulations, we cannot do skin testing on the same day as your first visit. - We are all working to fight this, but for now we need to do two separate visits.  - We will know more after we do testing at the next visit.  - The skin testing visit can be squeezed in at your convenience.  - Then we can make a more full plan to address all of your symptoms. - Be sure to stop your antihistamines for 3 days before this appointment.   3. Allergic contact dermatitis - We could do some testing to look for chemical/metal/adhesive triggers. - This involves patch testing, which is placed on your back on a Monday and then read on a Wednesday and Friday.  4. Return in about 1 week (around 02/23/2024) for SKIN TESTING :(1-55 + select foods). You can have the follow up appointment with Dr. Idolina Maker or a Nurse Practicioner (our Nurse Practitioners are excellent and always have Physician oversight!).    Please inform us  of any Emergency Department visits, hospitalizations, or changes in symptoms. Call us  before going to the ED for breathing or allergy symptoms since we might be able to fit you in for a sick visit. Feel free to contact us  anytime with any questions, problems, or concerns.  It was a pleasure to meet you today!  Websites that have reliable patient information: 1. American Academy of Asthma, Allergy, and Immunology: www.aaaai.org 2. Food Allergy Research and Education (FARE): foodallergy.org 3. Mothers of Asthmatics: http://www.asthmacommunitynetwork.org 4. American College of Allergy, Asthma, and Immunology: www.acaai.org      "Like" us  on Facebook and Instagram for our latest updates!      A healthy democracy works best when [employer] participate! Make sure you are registered to vote! If you have moved or changed any of your contact information, you will need to get this updated before voting! Scan the QR codes below to learn more!

## 2024-02-16 NOTE — Progress Notes (Unsigned)
 NEW PATIENT  Date of Service/Encounter:  02/16/24  Consult requested by: Towanda Fret, MD   Assessment:   Peanut-induced anaphylaxis, subsequent encounter  Swelling of lip, tongue, and throat  Allergic contact dermatitis, unspecified trigger  Chronic rhinitis  Cushing syndrome - with tumor removal in 2012   Chiari malformation - s/p surgical correction in 2024   Plan/Recommendations:   Patient Instructions  1. Food induced anaphylaxis/lip tingling - We will do some testing to peanut for sure and major the other major food allergens are negative. - Food sheet provided so you can see what else we can test for.  2. Environmental allergies - Because of insurance stipulations, we cannot do skin testing on the same day as your first visit. - We are all working to fight this, but for now we need to do two separate visits.  - We will know more after we do testing at the next visit.  - The skin testing visit can be squeezed in at your convenience.  - Then we can make a more full plan to address all of your symptoms. - Be sure to stop your antihistamines for 3 days before this appointment.   3. Allergic contact dermatitis - We could do some testing to look for chemical/metal/adhesive triggers. - This involves patch testing, which is placed on your back on a Monday and then read on a Wednesday and Friday.  4. Return in about 1 week (around 02/23/2024) for SKIN TESTING :(1-55 + select foods). You can have the follow up appointment with Dr. Idolina Maker or a Nurse Practicioner (our Nurse Practitioners are excellent and always have Physician oversight!).    Please inform us  of any Emergency Department visits, hospitalizations, or changes in symptoms. Call us  before going to the ED for breathing or allergy symptoms since we might be able to fit you in for a sick visit. Feel free to contact us  anytime with any questions, problems, or concerns.  It was a pleasure to meet you  today!  Websites that have reliable patient information: 1. American Academy of Asthma, Allergy, and Immunology: www.aaaai.org 2. Food Allergy Research and Education (FARE): foodallergy.org 3. Mothers of Asthmatics: http://www.asthmacommunitynetwork.org 4. American College of Allergy, Asthma, and Immunology: www.acaai.org      "Like" us  on Facebook and Instagram for our latest updates!      A healthy democracy works best when [employer] participate! Make sure you are registered to vote! If you have moved or changed any of your contact information, you will need to get this updated before voting! Scan the QR codes below to learn more!              {Blank single:19197::"This note in its entirety was forwarded to the Provider who requested this consultation."}  Subjective:   Janet [name] is a 38 y.o. female presenting today for evaluation of  Chief Complaint  Patient presents with  . Allergy Testing    Like to be tested for nut allergy, foods and environmental     Janet [name] has a history of the following: Patient Active Problem List   Diagnosis Date Noted  . Acute mid back pain 12/15/2023  . Varicose veins of left lower leg 08/15/2023  . Varicose veins of leg with edema, right 08/15/2023  . Encounter for immunization 08/15/2023  . Persistent headaches 07/05/2023  . Elevated blood pressure reading without diagnosis of hypertension 07/05/2023  . Dehydration 02/17/2023  . Chest pain 12/04/2022  . Palpitations 12/04/2022  .  Heart murmur 12/04/2022  . Spotting 10/28/2022  . Pelvic pressure in female 10/28/2022  . Alopecia 10/09/2022  . Skin tag 10/09/2022  . 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 Papilledema 07/25/2022  . IIH (idiopathic intracranial hypertension) 07/25/2022  . 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  . Mild sleep apnea 03/11/2022  . Intracranial hypertension 03/01/2022  . Osteochondral defect of talus 12/06/2021  .  Excessive daytime sleepiness 12/06/2021  . H/O Cushing's syndrome 08/09/2021  . At risk for impaired metabolic function 01/05/2021  . Glucose intolerance (impaired glucose tolerance) 12/22/2020  . Abnormal ECG 12/22/2020  . Cushing's syndrome (HCC) 12/08/2020  . Prolapsed internal hemorrhoids, grade 2 11/25/2020  . Constipation 11/25/2020  . Back pain with radiculopathy 10/20/2020  . Grade II hemorrhoids 10/13/2020  . Rectal bleeding 08/19/2020  . Plantar fasciitis 07/05/2020  . Abnormal vaginal bleeding 04/07/2020  . Microscopic hematuria 11/08/2019  . Persistent proteinuria 11/08/2019  . Nail discoloration 08/24/2019  . Preeclampsia in postpartum period 06/06/2019  . Gestational hypertension 05/29/2019  . Chiari malformation type I (HCC) 11/27/2013  . Common migraine 11/11/2013  . Chronic nonintractable headache 07/09/2013  . Vitamin D  deficiency 03/06/2013  . Prediabetes 03/06/2013  . Gastroesophageal reflux disease 03/06/2013  . 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 Overweight (BMI 25.0-29.9) 07/07/2010  . Cardiac dysrhythmia 07/07/2010  . ANEMIA 06/23/2010    History obtained from: chart review and {Persons; PED relatives w/patient:19415::"patient"}.  Discussed the use of AI scribe software for clinical note transcription with the patient and/or guardian, who gave verbal consent to proceed.  Janet [name] was referred by Towanda Fret, MD.     [name] is a 38 y.o. female presenting for {Blank single:19197::"a food challenge","a drug challenge","skin testing","a sick visit","an evaluation of ***","a follow up visit"}.    Asthma/Respiratory Symptom History: ***  Allergic Rhinitis Symptom History: ***  Food Allergy Symptom History: ***  Skin Symptom History: ***  GERD Symptom History: ***  Infection Symptom History: ***  ***Otherwise, there is no history of other atopic diseases, including {Blank multiple:19196:o:"asthma","food allergies","drug  allergies","environmental allergies","stinging insect allergies","eczema","urticaria","contact dermatitis"}. There is no significant infectious history. ***Vaccinations are up to date.    Past Medical History: Patient Active Problem List   Diagnosis Date Noted  . Acute mid back pain 12/15/2023  . Varicose veins of left lower leg 08/15/2023  . Varicose veins of leg with edema, right 08/15/2023  . Encounter for immunization 08/15/2023  . Persistent headaches 07/05/2023  . Elevated blood pressure reading without diagnosis of hypertension 07/05/2023  . Dehydration 02/17/2023  . Chest pain 12/04/2022  . Palpitations 12/04/2022  . Heart murmur 12/04/2022  . Spotting 10/28/2022  . Pelvic pressure in female 10/28/2022  . Alopecia 10/09/2022  . Skin tag 10/09/2022  . 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 Papilledema 07/25/2022  . IIH (idiopathic intracranial hypertension) 07/25/2022  . 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  . Mild sleep apnea 03/11/2022  . Intracranial hypertension 03/01/2022  . Osteochondral defect of talus 12/06/2021  . Excessive daytime sleepiness 12/06/2021  . H/O Cushing's syndrome 08/09/2021  . At risk for impaired metabolic function 01/05/2021  . Glucose intolerance (impaired glucose tolerance) 12/22/2020  . Abnormal ECG 12/22/2020  . Cushing's syndrome (HCC) 12/08/2020  . Prolapsed internal hemorrhoids, grade 2 11/25/2020  . Constipation 11/25/2020  . Back pain with radiculopathy 10/20/2020  . Grade II hemorrhoids 10/13/2020  . Rectal bleeding 08/19/2020  . Plantar fasciitis 07/05/2020  . Abnormal vaginal bleeding 04/07/2020  . Microscopic  hematuria 11/08/2019  . Persistent proteinuria 11/08/2019  . Nail discoloration 08/24/2019  . Preeclampsia in postpartum period 06/06/2019  . Gestational hypertension 05/29/2019  . Chiari malformation type I (HCC) 11/27/2013  . Common migraine 11/11/2013  . Chronic nonintractable headache 07/09/2013  .  Vitamin D  deficiency 03/06/2013  . Prediabetes 03/06/2013  . Gastroesophageal reflux disease 03/06/2013  . 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 Overweight (BMI 25.0-29.9) 07/07/2010  . Cardiac dysrhythmia 07/07/2010  . ANEMIA 06/23/2010    Medication List:  Allergies as of 02/16/2024       Reactions   Penicillins Anaphylaxis, Rash, Other (See Comments)   Has patient had a PCN reaction causing immediate rash, facial/tongue/throat swelling, SOB or lightheadedness with hypotension: No Has patient had a PCN reaction causing severe rash involving mucus membranes or skin necrosis: No Has patient had a PCN reaction that required hospitalization No Has patient had a PCN reaction occurring within the last 10 years: No If all of the above answers are "NO", then may proceed with Cephalosporin use.   Wound Dressing Adhesive    Burns skin   Latex Rash   Neomycin Rash   Breaks out with neosporin        Medication List        Accurate as of Feb 16, 2024  4:23 PM. If you have any questions, ask your nurse or doctor.          acetaZOLAMIDE  250 MG tablet Commonly known as: DIAMOX  Take 1/2 pill in the morning and 1 pill at bedtime. If side effects can take all in the evening. Also try to get to 2 pills a day preferably one pill twice daily but could be all at night if can't take pill in day due to side effects.   Calcium Carb-Cholecalciferol 500-10 MG-MCG Tabs Take 1 tablet by mouth in the morning, at noon, and at bedtime.   clotrimazole -betamethasone  cream Commonly known as: LOTRISONE  Apply 1 Application topically 2 (two) times daily.   cyclobenzaprine  10 MG tablet Commonly known as: FLEXERIL  Take 1 tablet (10 mg total) by mouth 3 (three) times daily as needed for muscle spasms.   loratadine  10 MG tablet Commonly known as: CLARITIN  TAKE 1 TABLET(10 MG) BY MOUTH DAILY   multivitamin tablet Take 1 tablet by mouth daily.   naproxen  500 MG tablet Commonly known as:  Naprosyn  Take 1 tablet (500 mg total) by mouth 2 (two) times daily with a meal.   pantoprazole  40 MG tablet Commonly known as: PROTONIX  TAKE 1 TABLET BY MOUTH ONCE daily AS NEEDED FOR heartburn   Vitamin D  (Ergocalciferol ) 1.25 MG (50000 UNIT) Caps capsule Commonly known as: DRISDOL  TAKE ONE CAPSULE BY MOUTH ONCE WEEKLY ON WEDNESDAY AND ONCE WEEKLY ON SUNDAY        Birth History: {Blank single:19197::"non-contributory","born premature and spent time in the NICU","born at term without complications"}  Developmental History: Korrina has met all milestones on time. She has required no {Blank multiple:19196:a:"speech therapy","occupational therapy","physical therapy"}. ***non-contributory  Past Surgical History: Past Surgical History:  Procedure Laterality Date  . arthroscopic treatment of osteochondral defect of right ankle  03/2021  . Bilateral plantar fascial Topaz  03/2021  . CHOLECYSTECTOMY  02/2015  . COLONOSCOPY N/A 04/19/2013   ZOX:WRUEAV mucosa in the terminal ileum/Single erosion in transverse colon/RECTAL BLEEDING DUE TO Moderate sized internal hemorrhoids/ trv colon erosion, bx benign.  . COLONOSCOPY WITH PROPOFOL  N/A 08/31/2020   Non-bleeding internal hemorrhoids. Colon torturous.   . ESOPHAGOGASTRODUODENOSCOPY N/A 04/19/2013   WUJ:WJXB  Non-erosive gastritis/PERI-UMBILICAL PAIN DUE TO GERD, GASTRITIS, AND CONSTIPATION. Bx with mild chronic inactive gastritis. No H.Pylori  . GASTRIC ROUX-EN-Y N/A 06/21/2022   Procedure: LAPAROSCOPIC ROUX-EN-Y GASTRIC BYPASS WITH UPPER ENDOSCOPY WITH HIATAL HERNIA REPAIR;  Surgeon: Aldean Hummingbird, MD;  Location: [facility];  Service: General;  Laterality: N/A;  . HEMORRHOID BANDING  2015   Dr. Riley Cheadle- in office banding procedure  . LAPAROSCOPIC ADRENALECTOMY  10/28/2010  . TONSILLECTOMY    . tumor removed left adrenal gland 01/12       Family History: Family History  Problem Relation Age of Onset  . Hypertension Mother   . Hyperlipidemia  Mother   . Colon polyps Mother   . Hypertension Father   . Colon polyps Father   . Sleep apnea Father   . Obesity Father   . Deep vein thrombosis Sister   . Hypertension Maternal Grandmother   . Diabetes Maternal Grandmother   . Deep vein thrombosis Maternal Grandmother   . Diabetes Paternal Grandmother   . COPD Paternal Grandfather   . Heart disease Paternal Grandfather   . Other Daughter        pulmonary valve stenosis  . Stroke Maternal Aunt   . Migraines Maternal Aunt   . Colon cancer Other        maternal great uncle     Social History: Shelinda lives at home with ***.  She lives in a house with hardwood throughout the home.  They have electric heating and central cooling.  There are no animals inside or outside of the home.  There are dust mite covers on the bed and the pillows.  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  There is exposure to fumes, chemicals, and dust.  She has never been a smoker.  She does have a HEPA filter in her home.   Review of systems otherwise negative other than that mentioned in the HPI.    Objective:   Blood pressure 118/64, pulse 92, temperature 99.1 F (37.3 C), temperature source Temporal, resp. rate 18, height 5\' 5"  (1.651 m), weight 176 lb 6.4 oz (80 kg), SpO2 98%. Body mass index is 29.35 kg/m.     Physical Exam   Diagnostic studies: {Blank single:19197::"none","deferred due to recent antihistamine use","deferred due to insurance stipulations that require a separate visit for testing","labs sent instead"," "}  Spirometry: {Blank single:19197::"results normal (FEV1: ***%, FVC: ***%, FEV1/FVC: ***%)","results abnormal (FEV1: ***%, FVC: ***%, FEV1/FVC: ***%)"}.    {Blank single:19197::"Spirometry consistent with mild obstructive disease","Spirometry consistent with moderate obstructive disease","Spirometry consistent with severe obstructive disease","Spirometry consistent with possible  restrictive disease","Spirometry consistent with mixed obstructive and restrictive disease","Spirometry uninterpretable due to technique","Spirometry consistent with normal pattern"}. {Blank single:19197::"Albuterol/Atrovent nebulizer","Xopenex/Atrovent nebulizer","Albuterol nebulizer","Albuterol four puffs via MDI","Xopenex four puffs via MDI"} treatment given in clinic with {Blank single:19197::"significant improvement in FEV1 per ATS criteria","significant improvement in FVC per ATS criteria","significant improvement in FEV1 and FVC per ATS criteria","improvement in FEV1, but not significant per ATS criteria","improvement in FVC, but not significant per ATS criteria","improvement in FEV1 and FVC, but not significant per ATS criteria","no improvement"}.  Allergy Studies: {Blank single:19197::"none","deferred due to recent antihistamine use","deferred due to insurance stipulations that require a separate visit for testing","labs sent instead"," "}    {Blank single:19197::"Allergy testing results were read and interpreted by myself, documented by clinical staff."," "}         Drexel Gentles, MD [facility] 

## 2024-03-01 ENCOUNTER — Ambulatory Visit: Admitting: Allergy & Immunology

## 2024-03-06 ENCOUNTER — Encounter: Payer: Self-pay | Admitting: Family Medicine

## 2024-03-06 ENCOUNTER — Ambulatory Visit (INDEPENDENT_AMBULATORY_CARE_PROVIDER_SITE_OTHER): Payer: 59 | Admitting: Family Medicine

## 2024-03-06 ENCOUNTER — Ambulatory Visit: Payer: Self-pay | Admitting: Family Medicine

## 2024-03-06 VITALS — BP 135/86 | HR 77 | Resp 18 | Ht 65.0 in | Wt 166.1 lb

## 2024-03-06 DIAGNOSIS — R790 Abnormal level of blood mineral: Secondary | ICD-10-CM

## 2024-03-06 DIAGNOSIS — R030 Elevated blood-pressure reading, without diagnosis of hypertension: Secondary | ICD-10-CM | POA: Diagnosis not present

## 2024-03-06 DIAGNOSIS — E559 Vitamin D deficiency, unspecified: Secondary | ICD-10-CM

## 2024-03-06 DIAGNOSIS — M958 Other specified acquired deformities of musculoskeletal system: Secondary | ICD-10-CM

## 2024-03-06 DIAGNOSIS — D509 Iron deficiency anemia, unspecified: Secondary | ICD-10-CM | POA: Diagnosis not present

## 2024-03-06 DIAGNOSIS — N920 Excessive and frequent menstruation with regular cycle: Secondary | ICD-10-CM

## 2024-03-06 DIAGNOSIS — G8929 Other chronic pain: Secondary | ICD-10-CM

## 2024-03-06 DIAGNOSIS — R519 Headache, unspecified: Secondary | ICD-10-CM

## 2024-03-06 DIAGNOSIS — G932 Benign intracranial hypertension: Secondary | ICD-10-CM

## 2024-03-06 LAB — CMP14+EGFR
ALT: 12 IU/L (ref 0–32)
AST: 22 IU/L (ref 0–40)
Albumin: 4.2 g/dL (ref 3.9–4.9)
Alkaline Phosphatase: 98 IU/L (ref 44–121)
BUN/Creatinine Ratio: 12 (ref 9–23)
BUN: 11 mg/dL (ref 6–20)
Bilirubin Total: 0.2 mg/dL (ref 0.0–1.2)
CO2: 21 mmol/L (ref 20–29)
Calcium: 8.7 mg/dL (ref 8.7–10.2)
Chloride: 104 mmol/L (ref 96–106)
Creatinine, Ser: 0.9 mg/dL (ref 0.57–1.00)
Globulin, Total: 2.4 g/dL (ref 1.5–4.5)
Glucose: 78 mg/dL (ref 70–99)
Potassium: 4 mmol/L (ref 3.5–5.2)
Sodium: 141 mmol/L (ref 134–144)
Total Protein: 6.6 g/dL (ref 6.0–8.5)
eGFR: 84 mL/min/{1.73_m2} (ref >59)

## 2024-03-06 LAB — CBC WITH DIFFERENTIAL/PLATELET
Basophils Absolute: 0 10*3/uL (ref 0.0–0.2)
Basos: 1 %
EOS (ABSOLUTE): 0.1 10*3/uL (ref 0.0–0.4)
Eos: 2 %
Hematocrit: 36.9 % (ref 34.0–46.6)
Hemoglobin: 11.6 g/dL (ref 11.1–15.9)
Immature Grans (Abs): 0 10*3/uL (ref 0.0–0.1)
Immature Granulocytes: 0 %
Lymphocytes Absolute: 1.7 10*3/uL (ref 0.7–3.1)
Lymphs: 43 %
MCH: 26.4 pg — ABNORMAL LOW (ref 26.6–33.0)
MCHC: 31.4 g/dL — ABNORMAL LOW (ref 31.5–35.7)
MCV: 84 fL (ref 79–97)
Monocytes Absolute: 0.3 10*3/uL (ref 0.1–0.9)
Monocytes: 8 %
Neutrophils Absolute: 1.8 10*3/uL (ref 1.4–7.0)
Neutrophils: 46 %
Platelets: 403 10*3/uL (ref 150–450)
RBC: 4.39 x10E6/uL (ref 3.77–5.28)
RDW: 14.5 % (ref 11.7–15.4)
WBC: 3.9 10*3/uL (ref 3.4–10.8)

## 2024-03-06 LAB — HEMOGLOBIN A1C
Est. average glucose Bld gHb Est-mCnc: 117 mg/dL
Hgb A1c MFr Bld: 5.7 % — ABNORMAL HIGH (ref 4.8–5.6)

## 2024-03-06 LAB — LIPID PANEL
Chol/HDL Ratio: 2.6 ratio (ref 0.0–4.4)
Cholesterol, Total: 136 mg/dL (ref 100–199)
HDL: 52 mg/dL (ref >39)
LDL Chol Calc (NIH): 73 mg/dL (ref 0–99)
Triglycerides: 47 mg/dL (ref 0–149)
VLDL Cholesterol Cal: 11 mg/dL (ref 5–40)

## 2024-03-06 LAB — VITAMIN D 25 HYDROXY (VIT D DEFICIENCY, FRACTURES): Vit D, 25-Hydroxy: 19.1 ng/mL — ABNORMAL LOW (ref 30.0–100.0)

## 2024-03-06 LAB — TSH: TSH: 0.967 u[IU]/mL (ref 0.450–4.500)

## 2024-03-06 MED ORDER — VITAMIN D (ERGOCALCIFEROL) 1.25 MG (50000 UNIT) PO CAPS: ORAL_CAPSULE | ORAL | 11 refills | Status: DC

## 2024-03-06 NOTE — Patient Instructions (Signed)
 F/U in 4 months, call if you need me sooner  Start OTC iron  with Vit C one tablet once daily   Discuss with Neurology so you have a clear understanding as to why surgery is recommended and reach out today to let hthm know of new headache symptoms since yesterday  Continue twice weekly vit D  Nurse pls add iron , ferritin and B123to labs recently drawn   All the best with career change  It is important that you exercise regularly at least 30 minutes 5 times a week. If you develop chest pain, have severe difficulty breathing, or feel very tired, stop exercising immediately and seek medical attention   Thanks for choosing [facility], we consider it a privelige to serve you.

## 2024-03-07 ENCOUNTER — Encounter: Payer: Self-pay | Admitting: Family Medicine

## 2024-03-07 DIAGNOSIS — N920 Excessive and frequent menstruation with regular cycle: Secondary | ICD-10-CM | POA: Insufficient documentation

## 2024-03-07 NOTE — Assessment & Plan Note (Signed)
 Currently having headache severe enough to require LP, still contemplating but not anxious to have shunt placed

## 2024-03-07 NOTE — Assessment & Plan Note (Signed)
 Intolerant of topamax , taking lower dose diamox 

## 2024-03-07 NOTE — Assessment & Plan Note (Signed)
 Resolved with weight loss.

## 2024-03-07 NOTE — Assessment & Plan Note (Addendum)
 Longer cycles, now 5 days , with flooding and clotting for 3 to 4 days, check iron  level,no interest inn gyne eval for this currently

## 2024-03-07 NOTE — Assessment & Plan Note (Signed)
 DASH diet and commitment to daily physical activity for a minimum of 30 minutes discussed and encouraged, as a part of hypertension management. The importance of attaining a healthy weight is also discussed.     03/06/2024    3:41 PM 02/16/2024    2:10 PM 12/15/2023    9:48 AM 11/07/2023    3:56 PM 11/07/2023    3:54 PM 09/19/2023    1:04 PM 08/15/2023   11:17 AM  BP/Weight  Systolic BP 135 118 128 142 150 133 132  Diastolic BP 86 64 86 91 97 85 91  Wt. (Lbs) 166.12 176.4 179  173.08 176 174.12  BMI 27.64 kg/m2 29.35 kg/m2 31.71 kg/m2  29.71 kg/m2 30.21 kg/m2 29.89 kg/m2     Reports lower numbers at home

## 2024-03-07 NOTE — Progress Notes (Signed)
 Janet [name]     MRN: [id number]      DOB: [birth year]  Chief Complaint  Patient presents with   Medical Management of Chronic Issues    4 month follow up    Headache    Complains of ongoing headaches that have been getting more frequent and more severe     HPI Janet [name] is here for follow up and re-evaluation of chronic medical conditions, medication management and review of any available recent lab and radiology data.  Preventive health is updated, specifically  Cancer screening and Immunization.   Questions or concerns regarding consultations or procedures which the PT has had in the interim are  addressed. The PT denies any adverse reactions to current medications since the last visit.  Complaint as above, has a 2 day history currently oof severe debilitating posterior headache, notes also clear nasal drainage, only relief will be spinal tap, last was in 10/2023 Neurosurgeon is proposing shunt placement, however she is hesitant to have surgery currently Appetite and ability to eat are good, weight is good, denies ankle pain  ROS Denies recent fever or chills. Denies sinus pressure, nasal congestion, ear pain or sore throat. Denies chest congestion, productive cough or wheezing. Denies chest pains, palpitations and leg swelling Denies abdominal pain, nausea, vomiting,diarrhea or constipation.   Denies dysuria, frequency, hesitancy or incontinence. Denies joint pain, swelling and limitation in mobility.  Denies depression, anxiety or insomnia. Denies skin break down or rash.   PE  BP 135/86   Pulse 77   Resp 18   Ht 5\' 5"  (1.651 m)   Wt 166 lb 1.9 oz (75.4 kg)   LMP 02/26/2024 (Exact Date)   SpO2 98%   BMI 27.64 kg/m   Patient alert and oriented and in no cardiopulmonary distress.Pt in pain  HEENT: No facial asymmetry, EOMI,     Neck decreased ROM .  Chest: Clear to auscultation bilaterally.  CVS: S1, S2 no murmurs, no S3.Regular rate.  ABD: Soft non tender.    Ext: No edema  MS: Adequate ROM spine, shoulders, hips and knees.  Skin: Intact, no ulcerations or rash noted.  Psych: Good eye contact, normal affect. Memory intact not anxious or depressed appearing.  CNS: CN 2-12 intact, power,  normal throughout.no focal deficits noted.   Assessment & Plan  Chronic nonintractable headache Currently having headache severe enough to require LP, still contemplating but not anxious to have shunt placed  Elevated blood pressure reading without diagnosis of hypertension DASH diet and commitment to daily physical activity for a minimum of 30 minutes discussed and encouraged, as a part of hypertension management. The importance of attaining a healthy weight is also discussed.     03/06/2024    3:41 PM 02/16/2024    2:10 PM 12/15/2023    9:48 AM 11/07/2023    3:56 PM 11/07/2023    3:54 PM 09/19/2023    1:04 PM 08/15/2023   11:17 AM  BP/Weight  Systolic BP 135 118 128 142 150 133 132  Diastolic BP 86 64 86 91 97 85 91  Wt. (Lbs) 166.12 176.4 179  173.08 176 174.12  BMI 27.64 kg/m2 29.35 kg/m2 31.71 kg/m2  29.71 kg/m2 30.21 kg/m2 29.89 kg/m2     Reports lower numbers at home  IIH (idiopathic intracranial hypertension) Intolerant of topamax , taking lower dose diamox   Osteochondral defect of talus Resolved with weight loss  Heavy menses Longer cycles, now 5 days , with flooding and clotting for 3  to 4 days, check iron  level,no interest inn gyne eval for this currently

## 2024-03-08 ENCOUNTER — Encounter: Payer: Self-pay | Admitting: Family Medicine

## 2024-03-08 ENCOUNTER — Ambulatory Visit: Admitting: Family Medicine

## 2024-03-08 ENCOUNTER — Ambulatory Visit: Payer: Self-pay | Admitting: Family Medicine

## 2024-03-08 DIAGNOSIS — J310 Chronic rhinitis: Secondary | ICD-10-CM | POA: Diagnosis not present

## 2024-03-08 DIAGNOSIS — T783XXA Angioneurotic edema, initial encounter: Secondary | ICD-10-CM | POA: Insufficient documentation

## 2024-03-08 DIAGNOSIS — T783XXD Angioneurotic edema, subsequent encounter: Secondary | ICD-10-CM

## 2024-03-08 DIAGNOSIS — L239 Allergic contact dermatitis, unspecified cause: Secondary | ICD-10-CM | POA: Insufficient documentation

## 2024-03-08 DIAGNOSIS — T7800XA Anaphylactic reaction due to unspecified food, initial encounter: Secondary | ICD-10-CM | POA: Insufficient documentation

## 2024-03-08 DIAGNOSIS — T7800XD Anaphylactic reaction due to unspecified food, subsequent encounter: Secondary | ICD-10-CM | POA: Diagnosis not present

## 2024-03-08 LAB — FERRITIN: Ferritin: 7 ng/mL — ABNORMAL LOW (ref 15–150)

## 2024-03-08 LAB — SPECIMEN STATUS REPORT

## 2024-03-08 LAB — IRON: Iron: 27 ug/dL (ref 27–159)

## 2024-03-08 LAB — VITAMIN B12: Vitamin B-12: 751 pg/mL (ref 232–1245)

## 2024-03-08 NOTE — Patient Instructions (Addendum)
 Rhinitis Your skin testing was negative at today's visit. Lab work has been entered to help us  evaluate your environmental allergies.  We will call you when the results become available. Continue an antihistamine once a day if needed for runny nose Consider saline nasal rinses as needed for nasal symptoms. Use this before any medicated nasal sprays for best result  Food allergy Your skin testing was negative at today's visit.  We have entered some lab work to help us  evaluate your food allergies.  Continue to avoid any foods that irritate your mouth  Allergic contact dermatitis Consider patch testing.  Chemical patch tests are placed on Monday, removed on Wednesday, first reading on Wednesday, and final reading on Friday.  Metal patch testing has an additional reading the following Monday.  Call the clinic if this treatment plan is not working well for you.  Follow up in 2 months or sooner if needed.   Chemicals in the patch testing

## 2024-03-08 NOTE — Addendum Note (Signed)
 Addended by: Towanda Fret on: 03/08/2024 08:20 AM   Modules accepted: Orders

## 2024-03-08 NOTE — Progress Notes (Signed)
   [address] [place] [address] Dept: [phone]  FOLLOW UP NOTE  Patient ID: Janet [name], female    DOB: Apr 27, 1986  Age: 38 y.o. MRN: [id number] Date of Office Visit: 03/08/2024  Assessment  Chief Complaint: Allergy Testing  HPI Janet [name] is a 38 year old female who presents to the clinic for follow-up visit with allergy skin testing.  She was last seen in this clinic on 02/16/2024 by Dr. Idolina Maker as a new patient for evaluation of peanut induced anaphylaxis, angioedema, allergic contact dermatitis, chronic rhinitis, Cushing syndrome with tumor removal in 2012, and Chiari malformation with surgical correction in 2024.  At today's visit, she reports she is feeling well overall with no cardiopulmonary or gastrointestinal symptoms.  She has not had any antihistamines over the last 3 days.  Her current medications are listed in the chart.  Drug Allergies:  Allergies  Allergen Reactions   Penicillins Anaphylaxis, Rash and Other (See Comments)    Has patient had a PCN reaction causing immediate rash, facial/tongue/throat swelling, SOB or lightheadedness with hypotension: No Has patient had a PCN reaction causing severe rash involving mucus membranes or skin necrosis: No Has patient had a PCN reaction that required hospitalization No Has patient had a PCN reaction occurring within the last 10 years: No If all of the above answers are "NO", then may proceed with Cephalosporin use.   Wound Dressing Adhesive     Burns skin   Latex Rash   Neomycin Rash    Breaks out with neosporin     Diagnostics: Percutaneous environmental allergy skin testing was negative to the panel with adequate controls  Percutaneous selected food allergy skin testing was negative to selected foods with adequate controls  Assessment and Plan: 1. Allergy with anaphylaxis due to food   2. Chronic rhinitis   3. Angioedema, subsequent encounter   4. Allergic contact dermatitis,  unspecified trigger      Return in about 2 months (around 05/08/2024), or if symptoms worsen or fail to improve.    Thank you for the opportunity to care for this patient.  Please do not hesitate to contact me with questions.  Marinus Sic, FNP [facility]

## 2024-03-12 ENCOUNTER — Inpatient Hospital Stay: Admitting: Hematology

## 2024-03-12 ENCOUNTER — Inpatient Hospital Stay

## 2024-03-12 NOTE — Progress Notes (Signed)
 [facility] [address] [place] [address]   Clinic Day:  03/13/2024  Referring physician: Towanda Fret, MD  Patient Care Team: Towanda Fret, MD as PCP - General Rourk, Windsor Hatcher, MD as Consulting Physician (Gastroenterology)   ASSESSMENT & PLAN:   Assessment:  1.  Iron  deficiency anemia: - Patient seen at the request of Dr. Rodolph Clap. - She has history of heavy menses all her life and gastric bypass surgery (2023). - Colonoscopy (08/30/2020): Nonbleeding internal hemorrhoids, tortuous colon. - Labs on 03/05/2024: Ferritin 7, Hb-11.6. - Intolerance to oral iron  therapy with nausea/vomiting.  No prior history of transfusion.  Positive for ice pica.  2.  Social/family history: - Non-smoker.  Maternal grandmother and maternal aunt have anemia and require parenteral iron .  No family history of malignancy.  Plan:  1.  Iron  deficiency anemia: - We reviewed labs from 03/05/2024 which showed severely low ferritin levels.  Etiology is multifactorial, blood loss from heavy menstruation and possible decreased absorption post gastric bypass surgery.  As she cannot tolerate oral iron  therapy, recommend parenteral iron  therapy with INFeD 1 g IV x 1.  We discussed rare chance of anaphylaxis.  Will give premedications with steroids, H1 and H2 blockers. - RTC 8 weeks for follow-up with repeat CBC, ferritin and iron  panel.  Will also check B12, MMA and folic acid  prior to next visit.   No orders of the defined types were placed in this encounter.     Nadeen Augusta Teague,acting as a Neurosurgeon for Paulett Boros, MD.,have documented all relevant documentation on the behalf of Paulett Boros, MD,as directed by  Paulett Boros, MD while in the presence of Paulett Boros, MD.   I, Paulett Boros MD, have reviewed the above documentation for accuracy and completeness, and I agree with the above.   Paulett Boros, MD   6/4/20251:45  PM  CHIEF COMPLAINT/PURPOSE OF CONSULT:   Diagnosis: Iron  deficiency anemia  Current Therapy: INFeD  HISTORY OF PRESENT ILLNESS:   Janet [name] is a 38 y.o. [name] presenting to clinic today for evaluation of iron  deficiency anemia at the request of Towanda Fret, MD.  Patient has a medical history of idiopathic intracranial hypertension, heavy menses, gastric bypass with hiatal hernia repair on 06/21/22, hemorrhoids, varicose veins, Cushing's Syndrome, chiari malformation type 1, and corticoadrenal overactivity.   Janet [name] was seen by her PCP on 03/06/24 for regular follow-up with lab work done the day prior. She was recommended to start iron  supplements and vitamin C at this visit and took them once, though she was unable to tolerate these due to vomiting and discontinued the medications.    Her most recent CBC diff from 03/05/24 showed low MCH at 26.4 and low MCHC at 31.4. Her HGB has been mildly low in the past year. Vitamin D  from 03/05/24 was low at 19.1 and vitamin B 12 was normal at 751. Ferritin from 03/05/24 was low at 7 and iron  was low at 27.   Her last colonoscopy was on 08/31/20 with Dr. Mordechai April.   Today, she states that she is doing well overall. Her appetite level is at 60%. Her energy level is at 10%.  PAST MEDICAL HISTORY:   Past Medical History: Past Medical History:  Diagnosis Date   Adrenal tumor    Anemia    Phreesia 08/12/2020   Angio-edema    Arthritis    Borderline diabetes 2011   r/t adrenal tumor, now resolved   Carpal tunnel syndrome during  pregnancy    Chiari malformation type I (HCC) 2015   on MRI   Cushing's syndrome (HCC) 11/27/2013   Gallbladder problem    Gastroesophageal reflux disease    GERD (gastroesophageal reflux disease)    Phreesia 08/12/2020   Hematuria 09/18/2015   Hemorrhoids    History of Chiari malformation    Hypertension    Phreesia 08/12/2020   Internal hemorrhoids with other complication 12/27/2013   JAN 2015 R ANT MAR  2015 R POS AND L LAT BAND    Menstrual periods irregular 09/18/2015   Migraine without aura, with intractable migraine, so stated, without mention of status migrainosus 11/11/2013   Plantar fasciitis    Prediabetes    Preeclampsia 05/21/2019   2x/wk testing nst alt w/ bpp/dopp      Deliver @ 37wks        IOL scheduled for 8/19 @ 0830   Sleep apnea    Urticaria    Vitamin D  deficiency     Surgical History: Past Surgical History:  Procedure Laterality Date   arthroscopic treatment of osteochondral defect of right ankle  03/2021   Bilateral plantar fascial Topaz  03/2021   CHOLECYSTECTOMY  02/2015   COLONOSCOPY N/A 04/19/2013   UEA:VWUJWJ mucosa in the terminal ileum/Single erosion in transverse colon/RECTAL BLEEDING DUE TO Moderate sized internal hemorrhoids/ trv colon erosion, bx benign.   COLONOSCOPY WITH PROPOFOL  N/A 08/31/2020   Non-bleeding internal hemorrhoids. Colon torturous.    ESOPHAGOGASTRODUODENOSCOPY N/A 04/19/2013   XBJ:YNWG Non-erosive gastritis/PERI-UMBILICAL PAIN DUE TO GERD, GASTRITIS, AND CONSTIPATION. Bx with mild chronic inactive gastritis. No H.Pylori   GASTRIC ROUX-EN-Y N/A 06/21/2022   Procedure: LAPAROSCOPIC ROUX-EN-Y GASTRIC BYPASS WITH UPPER ENDOSCOPY WITH HIATAL HERNIA REPAIR;  Surgeon: Aldean Hummingbird, MD;  Location: [facility];  Service: General;  Laterality: N/A;   HEMORRHOID BANDING  2015   Dr. Riley Cheadle- in office banding procedure   LAPAROSCOPIC ADRENALECTOMY  10/28/2010   TONSILLECTOMY     tumor removed left adrenal gland 01/12      Social History: Social History   Socioeconomic History   Marital status: Single    Spouse name: Not on file   Number of children: 1   Years of education: BA   Highest education level: Master's degree (e.g., MA, MS, MEng, MEd, MSW, MBA)  Occupational History   Occupation: full time IT sales professional: [employer]  Tobacco Use   Smoking status: Never   Smokeless tobacco: Never  Vaping Use   Vaping status: Never  Used  Substance and Sexual Activity   Alcohol use: No   Drug use: No   Sexual activity: Yes    Birth control/protection: Surgical    Comment: vasectomy  Other Topics Concern   Not on file  Social History Narrative   Lives with spouse and 2 children   Right Handed   Drinks no caffeine    Social Drivers of Health   Financial Resource Strain: Low Risk  (01/12/2024)   Received from [facility]   Overall Financial Resource Strain (CARDIA)    Difficulty of Paying Living Expenses: Not hard at all  Food Insecurity: No Food Insecurity (01/12/2024)   Received from [facility]   Hunger Vital Sign    Worried About Running Out of Food in the Last Year: Never true    Ran Out of Food in the Last Year: Never true  Transportation Needs: No Transportation Needs (01/12/2024)   Received from [facility] - Transportation  Lack of Transportation (Medical): No    Lack of Transportation (Non-Medical): No  Physical Activity: Sufficiently Active (11/26/2023)   Received from [facility]   Exercise Vital Sign    Days of Exercise per Week: 5 days    Minutes of Exercise per Session: 60 min  Stress: No Stress Concern Present (11/26/2023)   Received from [facility] - Occupational Stress Questionnaire    Feeling of Stress : Not at all  Social Connections: Socially Integrated (01/12/2024)   Received from [facility]   Social Network    How would you rate your social network (family, work, friends)?: Good participation with social networks  Intimate Partner Violence: Not At Risk (01/12/2024)   Received from [facility]   HITS    Over the last 12 months how often did your partner physically hurt you?: Never    Over the last 12 months how often did your partner insult you or talk down to you?: Never    Over the last 12 months how often did your partner threaten you with physical harm?: Never    Over the last 12 months how often did your partner  scream or curse at you?: Never    Family History: Family History  Problem Relation Age of Onset   Hypertension Mother    Hyperlipidemia Mother    Colon polyps Mother    Hypertension Father    Colon polyps Father    Sleep apnea Father    Obesity Father    Deep vein thrombosis Sister    Hypertension Maternal Grandmother    Diabetes Maternal Grandmother    Deep vein thrombosis Maternal Grandmother    Diabetes Paternal Grandmother    COPD Paternal Grandfather    Heart disease Paternal Grandfather    Other Daughter        pulmonary valve stenosis   Stroke Maternal Aunt    Migraines Maternal Aunt    Colon cancer Other        maternal great uncle    Current Medications:  Current Outpatient Medications:    acetaZOLAMIDE  (DIAMOX ) 250 MG tablet, Take 1/2 pill in the morning and 1 pill at bedtime. If side effects can take all in the evening. Also try to get to 2 pills a day preferably one pill twice daily but could be all at night if can't take pill in day due to side effects., Disp: 180 tablet, Rfl: 4   Calcium Carb-Cholecalciferol 500-10 MG-MCG TABS, Take 1 tablet by mouth in the morning, at noon, and at bedtime., Disp: , Rfl:    clotrimazole -betamethasone  (LOTRISONE ) cream, Apply 1 Application topically 2 (two) times daily., Disp: 45 g, Rfl: 1   loratadine  (CLARITIN ) 10 MG tablet, TAKE 1 TABLET(10 MG) BY MOUTH DAILY, Disp: 90 tablet, Rfl: 1   Multiple Vitamin (MULTIVITAMIN) tablet, Take 1 tablet by mouth daily., Disp: , Rfl:    pantoprazole  (PROTONIX ) 40 MG tablet, TAKE 1 TABLET BY MOUTH ONCE daily AS NEEDED FOR heartburn, Disp: 30 tablet, Rfl: 6   Vitamin D , Ergocalciferol , (DRISDOL ) 1.25 MG (50000 UNIT) CAPS capsule, TAKE ONE CAPSULE BY MOUTH ONCE WEEKLY ON WEDNESDAY AND ONCE WEEKLY ON SUNDAY, Disp: 8 capsule, Rfl: 11 No current facility-administered medications for this visit.  Facility-Administered Medications Ordered in Other Visits:    ondansetron  (ZOFRAN -ODT) disintegrating  tablet 4 mg, 4 mg, Oral, Q4H PRN **OR** ondansetron  (ZOFRAN ) 4 mg in sodium chloride  0.9 % 50 mL IVPB, 4 mg, Intravenous, Q4H PRN,  Stechschulte, Avon Boers, MD   sodium chloride  0.9 % 1,000 mL with thiamine  100 mg, folic acid  1 mg, M.V.I. Adult  10 mL infusion, , Intravenous, Once, Stechschulte, Avon Boers, MD   sodium chloride  0.9 % bolus 1,000 mL, 1,000 mL, Intravenous, Once, Stechschulte, Avon Boers, MD   Allergies: Allergies  Allergen Reactions   Penicillins Anaphylaxis, Rash and Other (See Comments)    Has patient had a PCN reaction causing immediate rash, facial/tongue/throat swelling, SOB or lightheadedness with hypotension: No Has patient had a PCN reaction causing severe rash involving mucus membranes or skin necrosis: No Has patient had a PCN reaction that required hospitalization No Has patient had a PCN reaction occurring within the last 10 years: No If all of the above answers are "NO", then may proceed with Cephalosporin use.   Wound Dressing Adhesive     Burns skin   Latex Rash   Neomycin Rash    Breaks out with neosporin    REVIEW OF SYSTEMS:   Review of Systems  Constitutional:  Positive for fatigue. Negative for chills and fever.  HENT:   Negative for lump/mass, mouth sores, nosebleeds, sore throat and trouble swallowing.   Eyes:  Negative for eye problems.  Respiratory:  Negative for cough and shortness of breath.   Cardiovascular:  Negative for chest pain, leg swelling and palpitations.  Gastrointestinal:  Positive for nausea. Negative for abdominal pain, constipation, diarrhea and vomiting.  Genitourinary:  Negative for bladder incontinence, difficulty urinating, dysuria, frequency, hematuria and nocturia.   Musculoskeletal:  Negative for arthralgias, back pain, flank pain, myalgias and neck pain.  Skin:  Negative for itching and rash.  Neurological:  Positive for dizziness and headaches. Negative for numbness.  Hematological:  Does not bruise/bleed easily.   Psychiatric/Behavioral:  Positive for sleep disturbance. Negative for depression and suicidal ideas. The patient is not nervous/anxious.   All other systems reviewed and are negative.    VITALS:   Last menstrual period 02/26/2024.  Wt Readings from Last 3 Encounters:  03/06/24 166 lb 1.9 oz (75.4 kg)  02/16/24 176 lb 6.4 oz (80 kg)  12/15/23 179 lb (81.2 kg)    There is no height or weight on file to calculate BMI.   PHYSICAL EXAM:   Physical Exam Vitals and nursing note reviewed. Exam conducted with a chaperone present.  Constitutional:      Appearance: Normal appearance.  Cardiovascular:     Rate and Rhythm: Normal rate and regular rhythm.     Pulses: Normal pulses.     Heart sounds: Normal heart sounds.  Pulmonary:     Effort: Pulmonary effort is normal.     Breath sounds: Normal breath sounds.  Abdominal:     Palpations: Abdomen is soft. There is no hepatomegaly, splenomegaly or mass.     Tenderness: There is no abdominal tenderness.  Musculoskeletal:     Right lower leg: No edema.     Left lower leg: No edema.  Lymphadenopathy:     Cervical: No cervical adenopathy.     Right cervical: No superficial, deep or posterior cervical adenopathy.    Left cervical: No superficial, deep or posterior cervical adenopathy.     Upper Body:     Right upper body: No supraclavicular or axillary adenopathy.     Left upper body: No supraclavicular or axillary adenopathy.  Neurological:     General: No focal deficit present.     Mental Status: She is alert and oriented to person, place, and time.  Psychiatric:        Mood and Affect: Mood normal.        Behavior: Behavior normal.    LABS:   CBC    Component Value Date/Time   WBC 3.9 03/05/2024 0718   WBC 6.5 05/27/2023 1650   RBC 4.39 03/05/2024 0718   RBC 4.22 05/27/2023 1650   HGB 11.6 03/05/2024 0718   HCT 36.9 03/05/2024 0718   PLT 403 03/05/2024 0718   MCV 84 03/05/2024 0718   MCH 26.4 (L) 03/05/2024 0718   MCH  28.0 05/27/2023 1650   MCHC 31.4 (L) 03/05/2024 0718   MCHC 32.9 05/27/2023 1650   RDW 14.5 03/05/2024 0718   LYMPHSABS 1.7 03/05/2024 0718   MONOABS 0.4 05/27/2023 1650   EOSABS 0.1 03/05/2024 0718   BASOSABS 0.0 03/05/2024 0718    CMP    Component Value Date/Time   NA 141 03/05/2024 0718   K 4.0 03/05/2024 0718   CL 104 03/05/2024 0718   CO2 21 03/05/2024 0718   GLUCOSE 78 03/05/2024 0718   GLUCOSE 103 (H) 05/27/2023 1650   BUN 11 03/05/2024 0718   CREATININE 0.90 03/05/2024 0718   CREATININE 0.71 03/20/2020 0703   CALCIUM 8.7 03/05/2024 0718   PROT 6.6 03/05/2024 0718   ALBUMIN 4.2 03/05/2024 0718   AST 22 03/05/2024 0718   ALT 12 03/05/2024 0718   ALKPHOS 98 03/05/2024 0718   BILITOT 0.2 03/05/2024 0718   GFRNONAA >60 05/27/2023 1650   GFRNONAA 111 03/20/2020 0703   GFRAA 129 03/20/2020 0703    No results found for: "CEA1", "CEA" / No results found for: "CEA1", "CEA" No results found for: "PSA1" No results found for: "KVQ259" No results found for: "CAN125"  No results found for: "TOTALPROTELP", "ALBUMINELP", "A1GS", "A2GS", "BETS", "BETA2SER", "GAMS", "MSPIKE", "SPEI" Lab Results  Component Value Date   FERRITIN 7 (L) 03/05/2024   FERRITIN 27 08/20/2019   FERRITIN 18 09/25/2018   No results found for: "LDH"   STUDIES:   No results found.

## 2024-03-13 ENCOUNTER — Encounter: Payer: Self-pay | Admitting: Hematology

## 2024-03-13 ENCOUNTER — Inpatient Hospital Stay: Attending: Hematology | Admitting: Hematology

## 2024-03-13 ENCOUNTER — Inpatient Hospital Stay

## 2024-03-13 VITALS — BP 140/87 | HR 78 | Temp 98.1°F | Resp 18 | Ht 65.0 in | Wt 160.7 lb

## 2024-03-13 DIAGNOSIS — G935 Compression of brain: Secondary | ICD-10-CM | POA: Insufficient documentation

## 2024-03-13 DIAGNOSIS — Z9884 Bariatric surgery status: Secondary | ICD-10-CM | POA: Diagnosis not present

## 2024-03-13 DIAGNOSIS — N920 Excessive and frequent menstruation with regular cycle: Secondary | ICD-10-CM | POA: Insufficient documentation

## 2024-03-13 DIAGNOSIS — E249 Cushing's syndrome, unspecified: Secondary | ICD-10-CM | POA: Insufficient documentation

## 2024-03-13 DIAGNOSIS — Z79899 Other long term (current) drug therapy: Secondary | ICD-10-CM | POA: Diagnosis not present

## 2024-03-13 DIAGNOSIS — D509 Iron deficiency anemia, unspecified: Secondary | ICD-10-CM | POA: Insufficient documentation

## 2024-03-13 DIAGNOSIS — D500 Iron deficiency anemia secondary to blood loss (chronic): Secondary | ICD-10-CM | POA: Insufficient documentation

## 2024-03-13 DIAGNOSIS — G932 Benign intracranial hypertension: Secondary | ICD-10-CM | POA: Insufficient documentation

## 2024-03-13 NOTE — Patient Instructions (Addendum)
 You were seen and examined today by Dr. Cheree Cords. Dr. Cheree Cords is a hematologist, meaning that he specializes in blood abnormalities. Dr. Cheree Cords discussed your past medical history, family history of cancers/blood conditions and the events that led to you being here today.  You were referred to Dr. Cheree Cords due to iron  deficiency anemia. Your iron  is low. We will arrange for you to have an IV iron  infusion here in the clinic.   Follow-up as scheduled.

## 2024-03-15 ENCOUNTER — Ambulatory Visit: Admitting: Allergy & Immunology

## 2024-03-20 ENCOUNTER — Ambulatory Visit

## 2024-03-22 ENCOUNTER — Inpatient Hospital Stay

## 2024-03-22 VITALS — BP 112/83 | HR 84 | Temp 97.9°F | Resp 18

## 2024-03-22 DIAGNOSIS — D509 Iron deficiency anemia, unspecified: Secondary | ICD-10-CM | POA: Diagnosis not present

## 2024-03-22 DIAGNOSIS — D500 Iron deficiency anemia secondary to blood loss (chronic): Secondary | ICD-10-CM

## 2024-03-22 MED ORDER — SODIUM CHLORIDE 0.9 % IV SOLN
950.0000 mg | Freq: Once | INTRAVENOUS | Status: AC
  Administered 2024-03-22: 950 mg via INTRAVENOUS
  Filled 2024-03-22: qty 19

## 2024-03-22 MED ORDER — ACETAMINOPHEN 325 MG PO TABS
650.0000 mg | ORAL_TABLET | Freq: Once | ORAL | Status: AC
  Administered 2024-03-22: 650 mg via ORAL
  Filled 2024-03-22: qty 2

## 2024-03-22 MED ORDER — PROCHLORPERAZINE EDISYLATE 10 MG/2ML IJ SOLN
10.0000 mg | INTRAMUSCULAR | Status: AC
  Administered 2024-03-22: 10 mg via INTRAVENOUS

## 2024-03-22 MED ORDER — SODIUM CHLORIDE 0.9 % IV SOLN
50.0000 mg | Freq: Once | INTRAVENOUS | Status: AC
  Administered 2024-03-22: 50 mg via INTRAVENOUS
  Filled 2024-03-22: qty 1

## 2024-03-22 MED ORDER — CETIRIZINE HCL 10 MG/ML IV SOLN
10.0000 mg | Freq: Once | INTRAVENOUS | Status: AC
  Administered 2024-03-22: 10 mg via INTRAVENOUS
  Filled 2024-03-22: qty 1

## 2024-03-22 MED ORDER — FAMOTIDINE IN NACL 20-0.9 MG/50ML-% IV SOLN
20.0000 mg | Freq: Once | INTRAVENOUS | Status: AC
  Administered 2024-03-22: 20 mg via INTRAVENOUS
  Filled 2024-03-22: qty 50

## 2024-03-22 MED ORDER — SODIUM CHLORIDE 0.9 % IV SOLN: INTRAVENOUS | Status: DC

## 2024-03-22 MED ORDER — METHYLPREDNISOLONE SODIUM SUCC 125 MG IJ SOLR
125.0000 mg | Freq: Once | INTRAMUSCULAR | Status: AC
  Administered 2024-03-22: 125 mg via INTRAVENOUS
  Filled 2024-03-22: qty 2

## 2024-03-22 NOTE — Patient Instructions (Signed)
 [facility] [place] - A DEPT OF [facility]  Discharge Instructions: Thank you for choosing [facility] to provide your oncology and hematology care.  If you have a lab appointment with the Cancer Center - please note that after April 8th, 2024, all labs will be drawn in the cancer center.  You do not have to check in or register with the main entrance as you have in the past but will complete your check-in in the cancer center.  Wear comfortable clothing and clothing appropriate for easy access to any Portacath or PICC line.   We strive to give you quality time with your provider. You may need to reschedule your appointment if you arrive late (15 or more minutes).  Arriving late affects you and other patients whose appointments are after yours.  Also, if you miss three or more appointments without notifying the office, you may be dismissed from the clinic at the provider's discretion.      For prescription refill requests, have your pharmacy contact our office and allow 72 hours for refills to be completed.    Today you received the following chemotherapy and/or immunotherapy agents Infed. Iron Dextran Injection What is this medication? IRON DEXTRAN (EYE ern DEX tran) treats low levels of iron in your body. Iron is a mineral that plays an important role in making red blood cells, which carry oxygen from your lungs to the rest of your body. This medicine may be used for other purposes; ask your health care provider or pharmacist if you have questions. COMMON BRAND NAME(S): Dexferrum, INFeD What should I tell my care team before I take this medication? They need to know if you have any of these conditions: Anemia not caused by low iron levels Heart disease High levels of iron in the blood Kidney disease Liver disease An unusual or allergic reaction to iron, other medications, foods, dyes, or preservatives Pregnant or trying to get  pregnant Breastfeeding How should I use this medication? This medication is injected into a vein or a muscle. It is given by your care team in a hospital or clinic setting. Talk to your care team about the use of this medication in children. While it may be prescribed for children as young as 4 months for selected conditions, precautions do apply. Overdosage: If you think you have taken too much of this medicine contact a poison control center or emergency room at once. NOTE: This medicine is only for you. Do not share this medicine with others. What if I miss a dose? Keep appointments for follow-up doses. It is important not to miss your dose. Call your care team if you are unable to keep an appointment. What may interact with this medication? Do not take this medication with any of the following: Deferoxamine Dimercaprol Other iron products This medication may also interact with the following: Chloramphenicol Deferasirox This list may not describe all possible interactions. Give your health care provider a list of all the medicines, herbs, non-prescription drugs, or dietary supplements you use. Also tell them if you smoke, drink alcohol, or use illegal drugs. Some items may interact with your medicine. What should I watch for while using this medication? Visit your care team for regular checks on your progress. Tell your care team if your symptoms do not start to get better or if they get worse. You may need blood work while taking this medication. You may need to eat more foods that contain  iron. Talk to your care team. Foods that contain iron include whole grains/cereals, dried fruits, beans, peas, leafy green vegetables, and organ meats (liver, kidney). Long-term use of this medication may increase your risk of some cancers. Talk to your care team about your risk of cancer. What side effects may I notice from receiving this medication? Side effects that you should report to your care  team as soon as possible: Allergic reactions--skin rash, itching, hives, swelling of the face, lips, tongue, or throat Low blood pressure--dizziness, feeling faint or lightheaded, blurry vision Shortness of breath Side effects that usually do not require medical attention (report to your care team if they continue or are bothersome): Flushing Headache Joint pain Muscle pain Nausea Pain, redness, or irritation at injection site This list may not describe all possible side effects. Call your doctor for medical advice about side effects. You may report side effects to FDA at 1-800-FDA-1088. Where should I keep my medication? This medication is given in a hospital or clinic. It will not be stored at home. NOTE: This sheet is a summary. It may not cover all possible information. If you have questions about this medicine, talk to your doctor, pharmacist, or health care provider.  2024 Elsevier/Gold Standard (2023-05-17 00:00:00)      To help prevent nausea and vomiting after your treatment, we encourage you to take your nausea medication as directed.  BELOW ARE SYMPTOMS THAT SHOULD BE REPORTED IMMEDIATELY: *FEVER GREATER THAN 100.4 F (38 C) OR HIGHER *CHILLS OR SWEATING *NAUSEA AND VOMITING THAT IS NOT CONTROLLED WITH YOUR NAUSEA MEDICATION *UNUSUAL SHORTNESS OF BREATH *UNUSUAL BRUISING OR BLEEDING *URINARY PROBLEMS (pain or burning when urinating, or frequent urination) *BOWEL PROBLEMS (unusual diarrhea, constipation, pain near the anus) TENDERNESS IN MOUTH AND THROAT WITH OR WITHOUT PRESENCE OF ULCERS (sore throat, sores in mouth, or a toothache) UNUSUAL RASH, SWELLING OR PAIN  UNUSUAL VAGINAL DISCHARGE OR ITCHING   Items with * indicate a potential emergency and should be followed up as soon as possible or go to the Emergency Department if any problems should occur.  Please show the CHEMOTHERAPY ALERT CARD or IMMUNOTHERAPY ALERT CARD at check-in to the Emergency Department and triage  nurse.  Should you have questions after your visit or need to cancel or reschedule your appointment, please contact [facility] [place] - A DEPT OF Eligha Bridegroom [facility] [phone]  and follow the prompts.  Office hours are 8:00 a.m. to 4:30 p.m. Monday - Friday. Please note that voicemails left after 4:00 p.m. may not be returned until the following business day.  We are closed weekends and major holidays. You have access to a nurse at all times for urgent questions. Please call the main number to the clinic [phone] and follow the prompts.  For any non-urgent questions, you may also contact your provider using MyChart. We now offer e-Visits for anyone 19 and older to request care online for non-urgent symptoms. For details visit [email or web address].   Also download the MyChart app! Go to the app store, search "MyChart", open the app, select [place], and log in with your MyChart username and password.

## 2024-03-22 NOTE — Progress Notes (Signed)
 Patient presents today for 1st Infed iron  infusion. MAR reviewed. Vital signs stable. Pre-medications given prior to Infed. See MAR. Pamphlet given to patient with patient teaching of Infed infusion and side effects .   13:48 pm patient calls out with complaints of nausea. Declined Zofran  at this time.    14:10 pm patient calls out with complaints of nausea. Patient vomited x 1. Infed stopped and 500 ml bolus of normal saline administered. 10 mg Compazine  given IV push x 1 dose. Verbal order received by  Dr. Orvis Blare.   Infed given today per MD orders. Tolerated infusion without adverse affects. Vital signs stable. No complaints at this time. Discharged from clinic ambulatory in stable condition. Alert and oriented x 3. F/U with [facility] as scheduled.

## 2024-04-13 ENCOUNTER — Emergency Department (HOSPITAL_COMMUNITY): Payer: Self-pay

## 2024-04-13 ENCOUNTER — Encounter: Payer: Self-pay | Admitting: Hematology

## 2024-04-13 ENCOUNTER — Emergency Department (HOSPITAL_COMMUNITY)
Admission: EM | Admit: 2024-04-13 | Discharge: 2024-04-13 | Disposition: A | Discharge status: Home / Self Care | Payer: Self-pay | Attending: Emergency Medicine | Admitting: Emergency Medicine

## 2024-04-13 ENCOUNTER — Encounter (HOSPITAL_COMMUNITY): Payer: Self-pay | Admitting: *Deleted

## 2024-04-13 ENCOUNTER — Other Ambulatory Visit: Payer: Self-pay

## 2024-04-13 DIAGNOSIS — Z91040 Latex allergy status: Secondary | ICD-10-CM | POA: Insufficient documentation

## 2024-04-13 DIAGNOSIS — G932 Benign intracranial hypertension: Secondary | ICD-10-CM | POA: Diagnosis not present

## 2024-04-13 DIAGNOSIS — R42 Dizziness and giddiness: Secondary | ICD-10-CM | POA: Insufficient documentation

## 2024-04-13 DIAGNOSIS — R519 Headache, unspecified: Secondary | ICD-10-CM | POA: Insufficient documentation

## 2024-04-13 LAB — CBC WITH DIFFERENTIAL/PLATELET
Abs Immature Granulocytes: 0 K/uL (ref 0.00–0.07)
Basophils Absolute: 0 K/uL (ref 0.0–0.1)
Basophils Relative: 1 %
Eosinophils Absolute: 0.1 K/uL (ref 0.0–0.5)
Eosinophils Relative: 2 %
HCT: 35.4 % — ABNORMAL LOW (ref 36.0–46.0)
Hemoglobin: 11.5 g/dL — ABNORMAL LOW (ref 12.0–15.0)
Immature Granulocytes: 0 %
Lymphocytes Relative: 42 %
Lymphs Abs: 1.9 K/uL (ref 0.7–4.0)
MCH: 27.7 pg (ref 26.0–34.0)
MCHC: 32.5 g/dL (ref 30.0–36.0)
MCV: 85.3 fL (ref 80.0–100.0)
Monocytes Absolute: 0.5 K/uL (ref 0.1–1.0)
Monocytes Relative: 10 %
Neutro Abs: 2.1 K/uL (ref 1.7–7.7)
Neutrophils Relative %: 45 %
Platelets: 308 K/uL (ref 150–400)
RBC: 4.15 MIL/uL (ref 3.87–5.11)
RDW: 16.3 % — ABNORMAL HIGH (ref 11.5–15.5)
WBC: 4.6 K/uL (ref 4.0–10.5)
nRBC: 0 % (ref 0.0–0.2)

## 2024-04-13 LAB — BASIC METABOLIC PANEL WITH GFR
Anion gap: 10 (ref 5–15)
BUN: 12 mg/dL (ref 6–20)
CO2: 24 mmol/L (ref 22–32)
Calcium: 8.9 mg/dL (ref 8.9–10.3)
Chloride: 104 mmol/L (ref 98–111)
Creatinine, Ser: 0.78 mg/dL (ref 0.44–1.00)
GFR, Estimated: >60 mL/min (ref >60)
Glucose, Bld: 84 mg/dL (ref 70–99)
Potassium: 4.2 mmol/L (ref 3.5–5.1)
Sodium: 138 mmol/L (ref 135–145)

## 2024-04-13 LAB — HCG, QUANTITATIVE, PREGNANCY: hCG, Beta Chain, Quant, S: <1 m[IU]/mL (ref <5)

## 2024-04-13 MED ORDER — LACTATED RINGERS IV BOLUS
1000.0000 mL | Freq: Once | INTRAVENOUS | Status: AC
  Administered 2024-04-13: 1000 mL via INTRAVENOUS

## 2024-04-13 MED ORDER — DIPHENHYDRAMINE HCL 50 MG/ML IJ SOLN
25.0000 mg | Freq: Once | INTRAMUSCULAR | Status: AC
  Administered 2024-04-13: 25 mg via INTRAVENOUS
  Filled 2024-04-13: qty 1

## 2024-04-13 MED ORDER — PROCHLORPERAZINE EDISYLATE 10 MG/2ML IJ SOLN
10.0000 mg | Freq: Once | INTRAMUSCULAR | Status: AC
  Administered 2024-04-13: 10 mg via INTRAVENOUS
  Filled 2024-04-13: qty 2

## 2024-04-13 MED ORDER — DEXAMETHASONE SODIUM PHOSPHATE 10 MG/ML IJ SOLN
10.0000 mg | Freq: Once | INTRAMUSCULAR | Status: AC
  Administered 2024-04-13: 10 mg via INTRAVENOUS
  Filled 2024-04-13: qty 1

## 2024-04-13 MED ORDER — MAGNESIUM SULFATE 2 GM/50ML IV SOLN
2.0000 g | Freq: Once | INTRAVENOUS | Status: AC
  Administered 2024-04-13: 2 g via INTRAVENOUS
  Filled 2024-04-13: qty 50

## 2024-04-13 MED ORDER — ACETAMINOPHEN 500 MG PO TABS
1000.0000 mg | ORAL_TABLET | Freq: Once | ORAL | Status: AC
  Administered 2024-04-13: 1000 mg via ORAL
  Filled 2024-04-13: qty 2

## 2024-04-13 NOTE — ED Triage Notes (Signed)
 Pt c/o lightheadedness since yesterday.  + nausea.  Pressure to back of her head. S/p head surgery at [facility]. Needs a shunt soon.

## 2024-04-13 NOTE — Discharge Instructions (Addendum)
 Thank you for coming to [facility] Emergency Department. You were seen for headache and dizziness in the setting of IIH. We did an exam, labs, and imaging, and these showed no acute findings.  Please follow up with your ophthalmologist on Monday and your neurosurgeon within 1 to 2 weeks for discussion of a VP shunt.  Please take your Diamox  as previously prescribed.  Do not hesitate to return to the ED or call 911 if you experience: -Worsening symptoms -Visual changes -Vertigo, severe dizziness -Numbness/tingling, asymmetric weakness, slurred speech, facial droop, confusion -Lightheadedness, passing out -Fevers/chills -Anything else that concerns you

## 2024-04-13 NOTE — ED Notes (Signed)
 Pt/family received d/c paperwork at this time. After going over the paperwork any questions, comments, or concerns were answered to the best of this nurse's knowledge. The pt/family verbally acknowledged the teachings/instructions.

## 2024-04-13 NOTE — ED Provider Notes (Signed)
 [place] EMERGENCY DEPARTMENT AT [facility] Provider Note   CSN: [id number] Arrival date & time: 04/13/24  1535     History  Chief Complaint  Patient presents with   Dizziness    Janet [name] is a 38 y.o. female with h/o chiari malformation s/p decompression with [facility] in 2024. Since yesterday feeling dizzy/lightheaded, +nausea. No vomiting. Endorses blurry vision and spots in her vision. Stating she doesn't feel like her self. Endorses sensation of pressure around area of prior decompression surgery. Planned for shunt surgery. Has had a few lumbar punctures since that time for increased pressure. Takes 250 mg diamox  at bedtime, but can't tolerate it very well, makes her feel very off, lethargic, and tired. Last lumbar puncture was attempted by IR under fluoroscopy and was unsuccessful one month ago. Last took tylenol  yesterday which didn't help. No longer taking Zanaflex or Topamax . Headaches are the same as they have been, mostly in occiput area, worse with bending over and valsalva.   Past Medical History:  Diagnosis Date   Adrenal tumor    Anemia    Phreesia 08/12/2020   Angio-edema    Arthritis    Borderline diabetes 2011   r/t adrenal tumor, now resolved   Carpal tunnel syndrome during pregnancy    Chiari malformation type I (HCC) 2015   on MRI   Cushing's syndrome (HCC) 11/27/2013   Gallbladder problem    Gastroesophageal reflux disease    GERD (gastroesophageal reflux disease)    Phreesia 08/12/2020   Hematuria 09/18/2015   Hemorrhoids    History of Chiari malformation    Hypertension    Phreesia 08/12/2020   Internal hemorrhoids with other complication 12/27/2013   JAN 2015 R ANT MAR 2015 R POS AND L LAT BAND    Menstrual periods irregular 09/18/2015   Migraine without aura, with intractable migraine, so stated, without mention of status migrainosus 11/11/2013   Plantar fasciitis    Prediabetes    Preeclampsia 05/21/2019   2x/wk testing nst  alt w/ bpp/dopp      Deliver @ 37wks        IOL scheduled for 8/19 @ 0830   Sleep apnea    Urticaria    Vitamin D  deficiency        Home Medications Prior to Admission medications   Medication Sig Start Date End Date Taking? Authorizing Provider  acetaZOLAMIDE  (DIAMOX ) 250 MG tablet Take 1/2 pill in the morning and 1 pill at bedtime. If side effects can take all in the evening. Also try to get to 2 pills a day preferably one pill twice daily but could be all at night if can't take pill in day due to side effects. 07/25/22   Ines Onetha NOVAK, MD  Calcium Carb-Cholecalciferol 500-10 MG-MCG TABS Take 1 tablet by mouth in the morning, at noon, and at bedtime.    [provider]  clotrimazole -betamethasone  (LOTRISONE ) cream Apply 1 Application topically 2 (two) times daily. 02/10/23   Antonetta Rollene BRAVO, MD  loratadine  (CLARITIN ) 10 MG tablet TAKE 1 TABLET(10 MG) BY MOUTH DAILY 01/11/24   Antonetta Rollene BRAVO, MD  Multiple Vitamin (MULTIVITAMIN) tablet Take 1 tablet by mouth daily.    [provider]  pantoprazole  (PROTONIX ) 40 MG tablet TAKE 1 TABLET BY MOUTH ONCE daily AS NEEDED FOR heartburn 08/15/22   Antonetta Rollene BRAVO, MD  Vitamin D , Ergocalciferol , (DRISDOL ) 1.25 MG (50000 UNIT) CAPS capsule TAKE ONE CAPSULE BY MOUTH ONCE WEEKLY ON WEDNESDAY AND ONCE WEEKLY  ON SUNDAY 03/06/24   Antonetta Rollene BRAVO, MD  ZEPBOUND  5 MG/0.5ML Pen SMARTSIG:5 Milligram(s) SUB-Q Once a Week 02/13/24   [provider]      Allergies    Penicillins, Wound dressing adhesive, Latex, and Neomycin    Review of Systems   Review of Systems A 10 point review of systems was performed and is negative unless otherwise reported in HPI.  Physical Exam Updated Vital Signs BP 124/87   Pulse 73   Temp 98.2 F (36.8 C) (Oral)   Resp 15   Ht 5' 5 (1.651 m)   Wt 73.5 kg   LMP 03/26/2024   SpO2 98%   BMI 26.96 kg/m  Physical Exam General: Normal appearing female, lying in bed.  HEENT: NCAT,  EOMI, PERRLA, Sclera anicteric, MMM, trachea midline. No nuchal rigidity. Cardiology: RRR, no murmurs/rubs/gallops.  Resp: Normal respiratory rate and effort. CTAB, no wheezes, rhonchi, crackles.  Abd: Soft, non-tender, non-distended. No rebound tenderness or guarding.  GU: Deferred. MSK: No peripheral edema or signs of trauma. Extremities without deformity or TTP. No cyanosis or clubbing. Skin: warm, dry.  Neuro: A&Ox4, CNs II-XII grossly intact. 5/5 strength all extremities. Sensation grossly intact. Normal speech.  Psych: Normal mood and affect.   ED Results / Procedures / Treatments   Labs (all labs ordered are listed, but only abnormal results are displayed) Labs Reviewed  CBC WITH DIFFERENTIAL/PLATELET - Abnormal; Notable for the following components:      Result Value   Hemoglobin 11.5 (*)    HCT 35.4 (*)    RDW 16.3 (*)    All other components within normal limits  BASIC METABOLIC PANEL WITH GFR  HCG, QUANTITATIVE, PREGNANCY  CBG MONITORING, ED    EKG EKG Interpretation Date/Time:  Saturday April 13 2024 16:32:36 EDT Ventricular Rate:  79 PR Interval:  166 QRS Duration:  98 QT Interval:  373 QTC Calculation: 428 R Axis:   51  Text Interpretation: Sinus rhythm Confirmed by Franklyn Gills [phone]) on 04/13/2024 4:34:30 PM  Radiology CT Head Wo Contrast Result Date: 04/13/2024 CLINICAL DATA:  Headache nausea and dizziness EXAM: CT HEAD WITHOUT CONTRAST TECHNIQUE: Contiguous axial images were obtained from the base of the skull through the vertex without intravenous contrast. RADIATION DOSE REDUCTION: This exam was performed according to the departmental dose-optimization program which includes automated exposure control, adjustment of the mA and/or kV according to patient size and/or use of iterative reconstruction technique. COMPARISON:  CT brain 05/27/2023, MRI 03/12/2022 FINDINGS: Brain: No acute territorial infarction, hemorrhage or intracranial mass. The ventricles are  nonenlarged. Stable postop appearance of posterior fossa Vascular: No hyperdense vessel or unexpected calcification. Skull: Status post sub occipital craniectomy and resection posterior arch of C1 Sinuses/Orbits: No acute finding. Other: None IMPRESSION: 1. No CT evidence for acute intracranial abnormality. 2. Stable postop appearance of posterior fossa consistent with Chiari decompression. Electronically Signed   By: Luke Bun M.D.   On: 04/13/2024 17:44    Procedures Procedures    Medications Ordered in ED Medications  acetaminophen  (TYLENOL ) tablet 1,000 mg (1,000 mg Oral Given 04/13/24 1711)  prochlorperazine  (COMPAZINE ) injection 10 mg (10 mg Intravenous Given 04/13/24 1714)  diphenhydrAMINE  (BENADRYL ) injection 25 mg (25 mg Intravenous Given 04/13/24 1708)  magnesium  sulfate IVPB 2 g 50 mL (2 g Intravenous New Bag/Given 04/13/24 1722)  dexamethasone  (DECADRON ) injection 10 mg (10 mg Intravenous Given 04/13/24 1713)  lactated ringers  bolus 1,000 mL (1,000 mLs Intravenous Bolus from Bag 04/13/24 1710)  ED Course/ Medical Decision Making/ A&P                          Medical Decision Making Amount and/or Complexity of Data Reviewed Labs: ordered. Decision-making details documented in ED Course. Radiology: ordered. Decision-making details documented in ED Course.  Risk OTC drugs. Prescription drug management.    MDM:    This patient presents to the ED for concern of HA/dizziness and known IIH, this involves an extensive number of treatment options, and is a complaint that carries with it a high risk of complications and morbidity.  I considered the following differential and admission for this acute, potentially life threatening condition.  Overall patient is extremely well-appearing and hemodynamically stable.  CT head does not demonstrate any acute intracranial normality and has stable postop appearance of her posterior fossa.   Most recently had a fluoroscopy guided LP that was not  successful but patient states that every other LP before that has been successful without any incident.  She has planned for a shunt due to the difficulty with medical management of her IIH and intolerance of medications.  I personally have seen her for the same symptoms in the past.  She is not pregnant, has no acute anemia or electrolyte derangements, glucose within normal limits as well.  With shared decision making with the patient, plan will be for headache cocktail and if not improved we will attempt an LP.  Fortunately, patient has no focal neurodeficits patient is given a headache cocktail including magnesium  and dexamethasone  and has complete resolution of her symptoms.    Clinical Course as of 04/13/24 1828  Sat Apr 13, 2024  1746 CT Head Wo Contrast 1. No CT evidence for acute intracranial abnormality. 2. Stable postop appearance of posterior fossa consistent with Chiari decompression.   [HN]  1746 HCG, Beta Chain, Quant, S: <1 neg [HN]  1827 Patient reevaluated.  She states she has 0 out of 10 pain at this moment and feels much better.  She states she is ready to be discharged.  She states she has follow-up with her ophthalmologist on Monday, and 2 days, for ophthalmologic evaluation that she gets every 3 months with her IIH.  She states she needs to call the neurosurgeon Dr. Rosslyn to follow-up about getting the shunt.  Patient was offered LP but she states she is ready to be discharged.  I encouraged patient to remain compliant with her Diamox .  Given discharge instructions and strict return precautions, all questions answered to patient satisfaction. [HN]    Clinical Course User Index [HN] Franklyn Sid SAILOR, MD    Labs: I Ordered, and personally interpreted labs.  The pertinent results include:  those litsed above  Imaging Studies ordered: I ordered imaging studies including CTH I independently visualized and interpreted imaging. I agree with the radiologist  interpretation  Additional history obtained from chart review.   Reevaluation: After the interventions noted above, I reevaluated the patient and found that they have :resolved  Social Determinants of Health: Lives independently  Disposition:  DC w/ discharge instructions/return precautions. All questions answered to patient's satisfaction.    Co morbidities that complicate the patient evaluation  Past Medical History:  Diagnosis Date   Adrenal tumor    Anemia    Phreesia 08/12/2020   Angio-edema    Arthritis    Borderline diabetes 2011   r/t adrenal tumor, now resolved   Carpal tunnel syndrome during pregnancy  Chiari malformation type I (HCC) 2015   on MRI   Cushing's syndrome (HCC) 11/27/2013   Gallbladder problem    Gastroesophageal reflux disease    GERD (gastroesophageal reflux disease)    Phreesia 08/12/2020   Hematuria 09/18/2015   Hemorrhoids    History of Chiari malformation    Hypertension    Phreesia 08/12/2020   Internal hemorrhoids with other complication 12/27/2013   JAN 2015 R ANT MAR 2015 R POS AND L LAT BAND    Menstrual periods irregular 09/18/2015   Migraine without aura, with intractable migraine, so stated, without mention of status migrainosus 11/11/2013   Plantar fasciitis    Prediabetes    Preeclampsia 05/21/2019   2x/wk testing nst alt w/ bpp/dopp      Deliver @ 37wks        IOL scheduled for 8/19 @ 0830   Sleep apnea    Urticaria    Vitamin D  deficiency      Medicines Meds ordered this encounter  Medications   acetaminophen  (TYLENOL ) tablet 1,000 mg   prochlorperazine  (COMPAZINE ) injection 10 mg   diphenhydrAMINE  (BENADRYL ) injection 25 mg   magnesium  sulfate IVPB 2 g 50 mL   dexamethasone  (DECADRON ) injection 10 mg   lactated ringers  bolus 1,000 mL    I have reviewed the patients home medicines and have made adjustments as needed  Problem List / ED Course: Problem List Items Addressed This Visit       Nervous and Auditory    IIH (idiopathic intracranial hypertension)   Other Visit Diagnoses       Dizziness    -  Primary     Nonintractable episodic headache, unspecified headache type       Relevant Medications   acetaminophen  (TYLENOL ) tablet 1,000 mg (Completed)                   This note was created using dictation software, which may contain spelling or grammatical errors.    Franklyn Sid SAILOR, MD 04/13/24 [phone]

## 2024-04-22 NOTE — Addendum Note (Signed)
 Addended by: Tamorah Hada A on: 04/22/2024 11:48 AM   Modules accepted: Orders

## 2024-04-23 ENCOUNTER — Inpatient Hospital Stay

## 2024-04-25 ENCOUNTER — Inpatient Hospital Stay: Admitting: Oncology

## 2024-04-25 NOTE — Progress Notes (Signed)
 Visit was canceled.   Delon Hope, NP 05/10/2024 8:57 AM

## 2024-04-30 ENCOUNTER — Inpatient Hospital Stay: Attending: Oncology | Admitting: Oncology

## 2024-04-30 DIAGNOSIS — D509 Iron deficiency anemia, unspecified: Secondary | ICD-10-CM

## 2024-04-30 DIAGNOSIS — D500 Iron deficiency anemia secondary to blood loss (chronic): Secondary | ICD-10-CM

## 2024-04-30 NOTE — Progress Notes (Signed)
 [facility] OFFICE PROGRESS NOTE  Antonetta Rollene BRAVO, MD  I connected with [name] on 04/30/24 at 10:45 AM EDT by telephone visit and verified that I am speaking with the correct person using two identifiers.   I discussed the limitations, risks, security and privacy concerns of performing an evaluation and management service by telemedicine and the availability of in-person appointments. I also discussed with the patient that there may be a patient responsible charge related to this service. The patient expressed understanding and agreed to proceed.   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 -She is unable to tolerate oral iron . -She was given 1 dose of INFeD  on 03/22/2024. - Reviewed [facility] labs from 04/23/2024 which showed improvement of her iron  saturations and ferritin.  Vitamin B12 WNL.  Hemoglobin still remains mildly low at 11.4. -She just recently had a heavy menstrual cycle and is extremely symptomatic with weakness and fatigue. -We discussed 1 additional IV iron  but half the dose she received last month.  Recommend 500 mg IV Venofer x 1 dose.  -Recheck labs in approximately 3 months with labs a few days before.  Iron  deficiency anemia due to chronic blood loss     Orders Placed This Encounter  Procedures   CBC with Differential    Standing Status:   Future    Expected Date:   07/31/2024    Expiration Date:   04/30/2025   Iron  and TIBC ([facility] ONLY)    Standing Status:   Future    Expected Date:   07/31/2024    Expiration Date:   04/30/2025   Ferritin    Standing Status:   Future    Expected Date:    07/31/2024    Expiration Date:   04/30/2025    INTERVAL HISTORY: Patient returns for recurrent anemia.  Reports she tolerated IV INFeD  1 g well on 03/22/2024.  Reports she felt slightly better after her infusion but now is starting to feel weak, tired and nauseated.  Her extremities are also very cold.  Reports constipation at times.  Appetite is low.  Reports she just finished her menstrual cycle a few days ago and it was extra heavy.  She was going through 4-5 overnight pads in 24 hours.  She was evaluated in the emergency room for dizziness and headaches on 04/13/2024.  Had CT scan of her brain which did not reveal any evidence of a acute intracranial abnormality.  She was given a headache cocktail and discharged home.  We reviewed CBC, iron  panel, B12, MMA and folic acid .  SUMMARY OF HEMATOLOGIC HISTORY: Ms. Lapine is a 38 year old female with history of Chiari malformation status post decompression with [facility] 2024.  Reports she has had several lumbar punctures and most recent lumbar puncture by IR under fluoroscopy was unsuccessful about a month ago.  She is followed by hematology for iron  deficiency anemia secondary to heavy menstrual cycles.  She denies any other episodes of bleeding.  She is unable to tolerate oral iron  secondary to GI bloating and upset.  Lab Results  Component Value Date   HGB 11.5 (L) 04/13/2024   FERRITIN 7 (L) 03/05/2024   VITAMINB12 751 03/05/2024  There were no vitals filed for this visit.  Review of Systems  Constitutional:  Positive for malaise/fatigue.  Gastrointestinal:  Positive for nausea.  Neurological:  Positive for dizziness and weakness.  Psychiatric/Behavioral:  The patient has insomnia.    Physical Exam Neurological:     Mental Status: She is alert and oriented to person, place, and time.     I provided 30 minutes of non face-to-face telephone visit time during this encounter, and > 50% was spent counseling as documented under my assessment  & plan.   Delon Hope, NP 04/30/2024 12:30 PM

## 2024-04-30 NOTE — Assessment & Plan Note (Addendum)
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 -She is unable to tolerate oral iron . -She was given 1 dose of INFeD  on 03/22/2024. - Reviewed [facility] labs from 04/23/2024 which showed improvement of her iron  saturations and ferritin.  Vitamin B12 WNL.  Hemoglobin still remains mildly low at 11.4. -She just recently had a heavy menstrual cycle and is extremely symptomatic with weakness and fatigue. -We discussed 1 additional IV iron  but half the dose she received last month.  Recommend 500 mg IV Venofer x 1 dose.  -Recheck labs in approximately 3 months with labs a few days before.

## 2024-05-02 ENCOUNTER — Inpatient Hospital Stay

## 2024-05-03 ENCOUNTER — Inpatient Hospital Stay: Payer: Self-pay

## 2024-05-10 ENCOUNTER — Ambulatory Visit: Admitting: Family Medicine

## 2024-05-10 ENCOUNTER — Inpatient Hospital Stay

## 2024-05-10 ENCOUNTER — Encounter: Payer: Self-pay | Admitting: Oncology

## 2024-05-10 ENCOUNTER — Inpatient Hospital Stay: Payer: Self-pay | Admitting: Oncology

## 2024-05-10 DIAGNOSIS — J309 Allergic rhinitis, unspecified: Secondary | ICD-10-CM

## 2024-05-10 NOTE — Patient Instructions (Incomplete)
 Rhinitis Your skin testing was negative at today's visit. Lab work has been entered to help us  evaluate your environmental allergies.  We will call you when the results become available. Continue an antihistamine once a day if needed for runny nose Consider saline nasal rinses as needed for nasal symptoms. Use this before any medicated nasal sprays for best result  Food allergy Your skin testing was negative at today's visit.  We have entered some lab work to help us  evaluate your food allergies.  Continue to avoid any foods that irritate your mouth  Allergic contact dermatitis Consider patch testing.  Chemical patch tests are placed on Monday, removed on Wednesday, first reading on Wednesday, and final reading on Friday.  Metal patch testing has an additional reading the following Monday.  Call the clinic if this treatment plan is not working well for you.  Follow up in 2 months or sooner if needed.   Chemicals in the patch testing

## 2024-05-10 NOTE — Progress Notes (Deleted)
   [address] [place] [address] Dept: [phone]  FOLLOW UP NOTE  Patient ID: Janet [name], female    DOB: 1985-11-05  Age: 38 y.o. MRN: [id number] Date of Office Visit: 05/10/2024  Assessment  Chief Complaint: No chief complaint on file.  HPI Janet [name] is a 38 year old female who presents to the clinic for follow-up visit.  She was last seen in this clinic on 03/08/2024 by Arlean Mutter, FNP, for evaluation of chronic rhinitis, allergic contact dermatitis needing patch testing, and possible food allergy  with negative skin testing.  Her last environmental allergy  skin testing on 03/08/2024 was negative to the environmental panel and negative to selected foods.  Will allergy  testing via lab and selected foods by lab have not been collected at this time  Discussed the use of AI scribe software for clinical note transcription with the patient, who gave verbal consent to proceed.  History of Present Illness      Drug Allergies:  Allergies  Allergen Reactions   Penicillins Anaphylaxis, Rash and Other (See Comments)    Has patient had a PCN reaction causing immediate rash, facial/tongue/throat swelling, SOB or lightheadedness with hypotension: No Has patient had a PCN reaction causing severe rash involving mucus membranes or skin necrosis: No Has patient had a PCN reaction that required hospitalization No Has patient had a PCN reaction occurring within the last 10 years: No If all of the above answers are NO, then may proceed with Cephalosporin use.   Wound Dressing Adhesive     Burns skin   Latex Rash   Neomycin Rash    Breaks out with neosporin    Physical Exam: LMP 03/26/2024    Physical Exam  Diagnostics:    Assessment and Plan: No diagnosis found.  No orders of the defined types were placed in this encounter.   There are no Patient Instructions on file for this visit.  No follow-ups on file.    Thank you for the opportunity to  care for this patient.  Please do not hesitate to contact me with questions.  Arlean Mutter, FNP [facility]

## 2024-05-20 DIAGNOSIS — Z0289 Encounter for other administrative examinations: Secondary | ICD-10-CM

## 2024-05-21 ENCOUNTER — Encounter: Payer: Self-pay | Admitting: Family Medicine

## 2024-05-21 ENCOUNTER — Ambulatory Visit: Admitting: Family Medicine

## 2024-05-21 VITALS — BP 138/81 | HR 70 | Ht 63.5 in | Wt 154.0 lb

## 2024-05-21 DIAGNOSIS — G932 Benign intracranial hypertension: Secondary | ICD-10-CM

## 2024-05-21 DIAGNOSIS — Z9884 Bariatric surgery status: Secondary | ICD-10-CM

## 2024-05-21 DIAGNOSIS — Z6826 Body mass index (BMI) 26.0-26.9, adult: Secondary | ICD-10-CM

## 2024-05-21 DIAGNOSIS — E669 Obesity, unspecified: Secondary | ICD-10-CM

## 2024-05-21 DIAGNOSIS — E66811 Obesity, class 1: Secondary | ICD-10-CM

## 2024-05-21 MED ORDER — TIRZEPATIDE-WEIGHT MANAGEMENT 10 MG/0.5ML ~~LOC~~ SOLN: 10.0000 mg | SUBCUTANEOUS | 0 refills | Status: DC

## 2024-05-21 NOTE — Progress Notes (Signed)
 Office: [phone]  /  Fax: [phone]   Initial Visit  Janet [name] was seen in clinic today to evaluate for obesity. She is interested in losing weight to improve overall health and reduce the risk of weight related complications. She presents today to review program treatment options, initial physical assessment, and evaluation.     She was referred by: Specialist  When asked what else they would like to accomplish? She states: Adopt a healthier eating pattern and lifestyle, Improve energy levels and physical activity, Improve existing medical conditions, and Improve quality of life Sent by Neurosurgeon Needs to restart Zepbound  due to IIH and get off ~20 more pounds  Weight history:  When asked how has your weight affected you? She states: Contributed to medical problems  Some associated conditions: Other: Cushings' syndrome, IIH, hx of adrenal tumor  Contributing factors: family history of obesity and history of metabolic surgery  Weight promoting medications identified: None  Current nutrition plan: None  Current level of physical activity: Other: gym workouts 5 x a week  Current or previous pharmacotherapy: GLP-1 and Phentermine - has been on Ozempic, Mounjaro , Phentermine, Topiramate  Did well on Zepbound  (180 lb --> 154 lb in 4 mos) with starting BMI in obese range  Response to medication: Had side effects so it was discontinued Had N/V from Ozempic and Mounjaro  Paresthesias from Phentermine and Topamax    Past medical history includes:   Past Medical History:  Diagnosis Date   Adrenal tumor    Anemia    Phreesia 08/12/2020   Angio-edema    Arthritis    Borderline diabetes 2011   r/t adrenal tumor, now resolved   Carpal tunnel syndrome during pregnancy    Chiari malformation type I (HCC) 2015   on MRI   Cushing's syndrome (HCC) 11/27/2013   Gallbladder problem    Gastroesophageal reflux disease    GERD (gastroesophageal reflux disease)    Phreesia  08/12/2020   Hematuria 09/18/2015   Hemorrhoids    History of Chiari malformation    Hypertension    Phreesia 08/12/2020   Internal hemorrhoids with other complication 12/27/2013   JAN 2015 R ANT MAR 2015 R POS AND L LAT BAND    Menstrual periods irregular 09/18/2015   Migraine without aura, with intractable migraine, so stated, without mention of status migrainosus 11/11/2013   Plantar fasciitis    Prediabetes    Preeclampsia 05/21/2019   2x/wk testing nst alt w/ bpp/dopp      Deliver @ 37wks        IOL scheduled for 8/19 @ 0830   Sleep apnea    Urticaria    Vitamin D  deficiency      Objective:   BP 138/81   Pulse 70   Ht 5' 3.5 (1.613 m)   Wt 154 lb (69.9 kg)   SpO2 100%   BMI 26.85 kg/m  She was weighed on the bioimpedance scale: Body mass index is 26.85 kg/m.  Peak Weight:240 lb , Body Fat%:39.3, Visceral Fat Rating:7, Weight trend over the last 12 months: Decreasing  General:  Alert, oriented and cooperative. Patient is in no acute distress.  Respiratory: Normal respiratory effort, no problems with respiration noted   Gait: able to ambulate independently  Mental Status: Normal mood and affect. Normal behavior. Normal judgment and thought content.   DIAGNOSTIC DATA REVIEWED:  BMET    Component Value Date/Time   NA 138 04/13/2024 1630   NA 141 03/05/2024 0718   K 4.2 04/13/2024 1630  CL 104 04/13/2024 1630   CO2 24 04/13/2024 1630   GLUCOSE 84 04/13/2024 1630   BUN 12 04/13/2024 1630   BUN 11 03/05/2024 0718   CREATININE 0.78 04/13/2024 1630   CREATININE 0.71 03/20/2020 0703   CALCIUM 8.9 04/13/2024 1630   GFRNONAA >60 04/13/2024 1630   GFRNONAA 111 03/20/2020 0703   GFRAA 129 03/20/2020 0703   Lab Results  Component Value Date   HGBA1C 5.7 (H) 03/05/2024   HGBA1C 5.8 (H) 02/27/2013   Lab Results  Component Value Date   INSULIN 17.9 12/08/2020   CBC    Component Value Date/Time   WBC 4.6 04/13/2024 1630   RBC 4.15 04/13/2024 1630   HGB 11.5  (L) 04/13/2024 1630   HGB 11.6 03/05/2024 0718   HCT 35.4 (L) 04/13/2024 1630   HCT 36.9 03/05/2024 0718   PLT 308 04/13/2024 1630   PLT 403 03/05/2024 0718   MCV 85.3 04/13/2024 1630   MCV 84 03/05/2024 0718   MCH 27.7 04/13/2024 1630   MCHC 32.5 04/13/2024 1630   RDW 16.3 (H) 04/13/2024 1630   RDW 14.5 03/05/2024 0718   Iron /TIBC/Ferritin/ %Sat    Component Value Date/Time   IRON  27 03/05/2024 0718   FERRITIN 7 (L) 03/05/2024 0718   Lipid Panel     Component Value Date/Time   CHOL 136 03/05/2024 0718   TRIG 47 03/05/2024 0718   HDL 52 03/05/2024 0718   CHOLHDL 2.6 03/05/2024 0718   CHOLHDL 3.1 03/20/2020 0703   VLDL 9 02/27/2013 1140   LDLCALC 73 03/05/2024 0718   LDLCALC 99 03/20/2020 0703   Hepatic Function Panel     Component Value Date/Time   PROT 6.6 03/05/2024 0718   ALBUMIN 4.2 03/05/2024 0718   AST 22 03/05/2024 0718   ALT 12 03/05/2024 0718   ALKPHOS 98 03/05/2024 0718   BILITOT 0.2 03/05/2024 0718      Component Value Date/Time   TSH 0.967 03/05/2024 0718     Assessment and Plan:   IIH (idiopathic intracranial hypertension) Managed by Dr Rosslyn s/p decompression surgery Continues to have chronic headaches and was told to continue weight reduction to reduce chances of needing VP shunt HA frequency did reduce while on Zepbound  and have increased these past 2 weeks without Zepbound .  Will work on resuming Zepbound  10 mg weekly (sent vials to [employer] for hx of obesit) Aim for body fat % closer to 30. F/u with Dr Rosslyn  Obesity (BMI 30.0-34.9) -     Tirzepatide -Weight Management; Inject 10 mg into the skin once a week.  Dispense: 2 mL; Refill: 0 She has done well on Zepbound  without adverse SE She has been intolerant to Ozempic and Mounjaro  due to N/V She had adverse SE from Phentermine and Topiramate  Given her obesity history compounded by IIH, will work on Therapist, occupational for Zepbound  RX  BMI 26.0-26.9,adult  H/O gastric bypass She  is managed by Dr Camellia Madura office s/p RYGB done Sept 2023, down from 240 lb She has done well with diet and exercise She takes a MVI  and vitamin D  as directed She has required IV iron  infusions Fasting IC next visit    Obesity Treatment / Action Plan:  Patient will work on garnering support from family and friends to begin weight loss journey. Will work on eliminating or reducing the presence of highly palatable, calorie dense foods in the home. Will complete provided nutritional and psychosocial assessment questionnaire before the next appointment. Will be  scheduled for indirect calorimetry to determine resting energy expenditure in a fasting state.  This will allow us  to create a reduced calorie, high-protein meal plan to promote loss of fat mass while preserving muscle mass. Will think about ideas on how to incorporate physical activity into their daily routine. Counseled on the health benefits of losing 5%-15% of total body weight. Was counseled on nutritional approaches to weight loss and benefits of reducing processed foods and consuming plant-based foods and high quality protein as part of nutritional weight management. Was counseled on pharmacotherapy and role as an adjunct in weight management.   Obesity Education Performed Today:  She was weighed on the bioimpedance scale and results were discussed and documented in the synopsis.  We discussed obesity as a disease and the importance of a more detailed evaluation of all the factors contributing to the disease.  We discussed the importance of long term lifestyle changes which include nutrition, exercise and behavioral modifications as well as the importance of customizing this to her specific health and social needs.  We discussed the benefits of reaching a healthier weight to alleviate the symptoms of existing conditions and reduce the risks of the biomechanical, metabolic and psychological effects of obesity.  Janet [name] appears to be in the action stage of change and states they are ready to start intensive lifestyle modifications and behavioral modifications.  30 minutes was spent today on this visit including the above counseling, pre-visit chart review, and post-visit documentation.  Reviewed by clinician on day of visit: allergies, medications, problem list, medical history, surgical history, family history, social history, and previous encounter notes pertinent to obesity diagnosis.    [name], D.O. DABFM, [facility] [address] [place], [place] [address] [phone]

## 2024-06-05 ENCOUNTER — Ambulatory Visit: Admitting: Family Medicine

## 2024-06-11 ENCOUNTER — Other Ambulatory Visit: Payer: Self-pay | Admitting: Family Medicine

## 2024-06-11 DIAGNOSIS — E66811 Obesity, class 1: Secondary | ICD-10-CM

## 2024-06-13 ENCOUNTER — Encounter: Payer: Self-pay | Admitting: Family Medicine

## 2024-06-13 ENCOUNTER — Ambulatory Visit: Admitting: Family Medicine

## 2024-06-13 VITALS — BP 130/75 | HR 85 | Temp 98.3°F | Ht 63.5 in | Wt 147.0 lb

## 2024-06-13 DIAGNOSIS — R5383 Other fatigue: Secondary | ICD-10-CM | POA: Diagnosis not present

## 2024-06-13 DIAGNOSIS — G932 Benign intracranial hypertension: Secondary | ICD-10-CM | POA: Diagnosis not present

## 2024-06-13 DIAGNOSIS — R0602 Shortness of breath: Secondary | ICD-10-CM | POA: Diagnosis not present

## 2024-06-13 DIAGNOSIS — K909 Intestinal malabsorption, unspecified: Secondary | ICD-10-CM

## 2024-06-13 DIAGNOSIS — E559 Vitamin D deficiency, unspecified: Secondary | ICD-10-CM

## 2024-06-13 DIAGNOSIS — R7303 Prediabetes: Secondary | ICD-10-CM

## 2024-06-13 DIAGNOSIS — Z6825 Body mass index (BMI) 25.0-25.9, adult: Secondary | ICD-10-CM

## 2024-06-13 DIAGNOSIS — Z9884 Bariatric surgery status: Secondary | ICD-10-CM

## 2024-06-13 DIAGNOSIS — Z8639 Personal history of other endocrine, nutritional and metabolic disease: Secondary | ICD-10-CM

## 2024-06-13 MED ORDER — ZEPBOUND 12.5 MG/0.5ML ~~LOC~~ SOAJ: 12.5000 mg | SUBCUTANEOUS | 0 refills | Status: DC

## 2024-06-13 NOTE — Progress Notes (Addendum)
 At a Glance:  Vitals Temp: 98.3 F (36.8 C) BP: 130/75 Pulse Rate: 85 SpO2: 99 %   Anthropometric Measurements Height: 5' 3.5 (1.613 m) Weight: 147 lb (66.7 kg) BMI (Calculated): 25.63 Starting Weight: 147lb   Body Composition  Body Fat %: 37.5 % Fat Mass (lbs): 55.2 lbs Muscle Mass (lbs): 87.2 lbs Total Body Water  (lbs): 68.8 lbs Visceral Fat Rating : 6   Other Clinical Data RMR: 1282 Fasting: Yes Labs: Yes Today's Visit #: 1 Starting Date: 06/13/24    EKG: Normal sinus rhythm, rate 79 BPM from 04/17/24  Indirect Calorimeter completed today shows a VO2 of 186 and a REE of 1282.  Her calculated basal metabolic rate is 8711 thus her basal metabolic rate is unchanged than expected.  Chief Complaint:  Obesity   Subjective:  Janet [name] (MR# [id number]) is a 38 y.o. female who presents for evaluation and treatment of obesity and related comorbidities.   Janet [name] is currently in the action stage of change and ready to dedicate time achieving and maintaining a healthier weight. Janet [name] is interested in becoming our patient and working on intensive lifestyle modifications including (but not limited to) diet and exercise for weight loss.  Janet [name] has been struggling with her weight. She has done very well post gastric bypass surgery with Dr Tanda Sept 2023 with a pre op weight of 240 lb.  She is at her nadir weight now.  She was told by Dr Janjua to achieve a weight of '125 lb for IIH' to reduce her risk of needing a VP shunt.  She feels good at her current weight, denies meal skipping.  She is also doing well on Zepbound  to assist in food noise and cravings.  She is able to hydrate well in between meals and makes it to the gym 4-5 x a week, doing cardio.  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    Janet [name]'s habits were reviewed today and are as follows: Her family eats meals together, her  desired weight loss is 135 lb, she has significant food cravings issues, and she struggles with emotional eating.     Other Fatigue [name] denies daytime somnolence and denies waking up still tired.  [name] generally gets 7 hours of sleep per night, and states that she has generally restful sleep. Snoring is not present. Apneic episodes are not present. Epworth Sleepiness Score is 15.   Shortness of Breath Janet [name] notes increasing shortness of breath with exercising and seems to be worsening over time with weight gain. She notes getting out of breath sooner with activity than she used to. This has gotten worse recently. Janet [name] denies shortness of breath at rest or orthopnea.   Depression Screen Janet [name]'s Food and Mood (modified PHQ-9) score was 7.     04/30/2024   10:46 AM  Depression screen PHQ 2/9  Decreased Interest 0  Down, Depressed, Hopeless 0  PHQ - 2 Score 0     Assessment and Plan:   Other Fatigue Janet [name] does not feel that her weight is causing her energy to be lower than it should be. Fatigue may be related to obesity, depression or many other causes. Labs will be ordered, and in the meanwhile, [name] will focus on self care including making healthy food choices, increasing physical activity and focusing on stress reduction.  Shortness of Breath Janet [name] does feel that she gets out of breath more easily  that she used to when she exercises. Janet [name]'s shortness of breath appears to be obesity related and exercise induced. She has agreed to work on weight loss and gradually increase exercise to treat her exercise induced shortness of breath. Will continue to monitor closely.    Problem List Items Addressed This Visit     Vitamin D  deficiency She is taking vitamin D  RX weekly Due for level today   Relevant Orders   VITAMIN D  25 Hydroxy (Vit-D Deficiency, Fractures)   Prediabetes Lab Results  Component Value Date   HGBA1C 5.7 (H) 03/05/2024   She has seen  improving A1c levels with weight loss post RYGB surgery Continue a low sugar/ low carb diet + regular exercise 30+ min 5 days/ wk   S/P gastric bypass Has good volume restriction with meals Avoids SSBS and drinking at mealtime Prioritizes lean protein and is working with dietician Taking a bariatric MVI + calcium daily    Relevant Medications   tirzepatide  (ZEPBOUND ) 12.5 MG/0.5ML Pen   IIH (idiopathic intracranial hypertension) Managed by Dr Rosslyn, she has seen a reduction in HA frequency and intensity on Zepbound  She is tolerating Zepbound  well w/o meal skipping or GI upset Headaches and blurred vision improving - sees Dr Octavia q 6 mos    Relevant Medications   tirzepatide  (ZEPBOUND ) 12.5 MG/0.5ML Pen   Other Visit Diagnoses       SOBOE (shortness of breath on exertion)    -  Primary     Other fatigue       Relevant Orders   Insulin , random     Intestinal malabsorption, unspecified type       Relevant Orders   Prealbumin   Vitamin B1     History of obesity         BMI 25.0-25.9,adult           Janet [name] is currently in the action stage of change and her goal is to continue with weight loss efforts. I recommend Janet [name] begin the structured treatment plan as follows:  She has agreed to following a lower carbohydrate, vegetable and lean protein rich diet plan  Exercise goals: For substantial health benefits, adults should do at least 150 minutes (2 hours and 30 minutes) a week of moderate-intensity, or 75 minutes (1 hour and 15 minutes) a week of vigorous-intensity aerobic physical activity, or an equivalent combination of moderate- and vigorous-intensity aerobic activity. Aerobic activity should be performed in episodes of at least 10 minutes, and preferably, it should be spread throughout the week.  Behavioral modification strategies:increasing lean protein intake, increasing vegetables, increase H2O intake, no skipping meals, meal planning and cooking strategies, and better  snacking choices  She was informed of the importance of frequent follow-up visits to maximize her success with intensive lifestyle modifications for her multiple health conditions. She was informed we would discuss her lab results at her next visit unless there is a critical issue that needs to be addressed sooner. Pleshette agreed to keep her next visit at the agreed upon time to discuss these results.  Objective:  General: Cooperative, alert, well developed, in no acute distress. HEENT: Conjunctivae and lids unremarkable. Cardiovascular: Regular rhythm.  Lungs: Normal work of breathing. Neurologic: No focal deficits.   Lab Results  Component Value Date   CREATININE 0.78 04/13/2024   BUN 12 04/13/2024   NA 138 04/13/2024   K 4.2 04/13/2024   CL 104 04/13/2024   CO2 24 04/13/2024   Lab Results  Component Value  Date   ALT 12 03/05/2024   AST 22 03/05/2024   ALKPHOS 98 03/05/2024   BILITOT 0.2 03/05/2024   Lab Results  Component Value Date   HGBA1C 5.7 (H) 03/05/2024   HGBA1C 6.2 (H) 12/08/2020   HGBA1C 5.7 (H) 07/10/2018   HGBA1C 5.6 02/07/2017   HGBA1C 5.7 (H) 01/18/2016   Lab Results  Component Value Date   INSULIN  17.9 12/08/2020   Lab Results  Component Value Date   TSH 0.967 03/05/2024   Lab Results  Component Value Date   CHOL 136 03/05/2024   HDL 52 03/05/2024   LDLCALC 73 03/05/2024   TRIG 47 03/05/2024   CHOLHDL 2.6 03/05/2024   Lab Results  Component Value Date   WBC 4.6 04/13/2024   HGB 11.5 (L) 04/13/2024   HCT 35.4 (L) 04/13/2024   MCV 85.3 04/13/2024   PLT 308 04/13/2024   Lab Results  Component Value Date   IRON  27 03/05/2024   FERRITIN 7 (L) 03/05/2024    Attestation Statements:  Reviewed by clinician on day of visit: allergies, medications, problem list, medical history, surgical history, family history, social history, and previous encounter notes.  Time spent on visit including pre-visit chart review and post-visit charting and  face- to face care including nutritional counseling, review of EKG, interpretation of body composition scale and indirect calorimetry and nutrition prescription  was 40 minutes.   [name], D.O. DABFM, DABOM [facility] [address] [place], [place] [address] [phone]

## 2024-06-14 LAB — VITAMIN D 25 HYDROXY (VIT D DEFICIENCY, FRACTURES): Vit D, 25-Hydroxy: 40.4 ng/mL (ref 30.0–100.0)

## 2024-06-14 LAB — INSULIN, RANDOM: INSULIN: 12.6 u[IU]/mL (ref 2.6–24.9)

## 2024-06-14 LAB — PREALBUMIN: PREALBUMIN: 16 mg/dL (ref 14–35)

## 2024-06-19 ENCOUNTER — Ambulatory Visit: Payer: Self-pay | Admitting: Family Medicine

## 2024-06-25 ENCOUNTER — Ambulatory Visit: Admitting: Family Medicine

## 2024-07-02 ENCOUNTER — Ambulatory Visit: Admitting: Family Medicine

## 2024-07-09 ENCOUNTER — Ambulatory Visit: Admitting: Family Medicine

## 2024-07-10 ENCOUNTER — Inpatient Hospital Stay

## 2024-07-10 ENCOUNTER — Inpatient Hospital Stay: Admitting: Oncology

## 2024-07-10 ENCOUNTER — Ambulatory Visit: Admitting: Family Medicine

## 2024-07-10 ENCOUNTER — Inpatient Hospital Stay: Attending: Oncology

## 2024-07-10 ENCOUNTER — Other Ambulatory Visit

## 2024-07-10 ENCOUNTER — Telehealth: Payer: Self-pay | Admitting: Family Medicine

## 2024-07-10 DIAGNOSIS — K912 Postsurgical malabsorption, not elsewhere classified: Secondary | ICD-10-CM | POA: Insufficient documentation

## 2024-07-10 DIAGNOSIS — Z9884 Bariatric surgery status: Secondary | ICD-10-CM | POA: Diagnosis not present

## 2024-07-10 DIAGNOSIS — D509 Iron deficiency anemia, unspecified: Secondary | ICD-10-CM | POA: Diagnosis present

## 2024-07-10 DIAGNOSIS — N920 Excessive and frequent menstruation with regular cycle: Secondary | ICD-10-CM | POA: Insufficient documentation

## 2024-07-10 DIAGNOSIS — D500 Iron deficiency anemia secondary to blood loss (chronic): Secondary | ICD-10-CM

## 2024-07-10 DIAGNOSIS — Z8639 Personal history of other endocrine, nutritional and metabolic disease: Secondary | ICD-10-CM

## 2024-07-10 LAB — CBC WITH DIFFERENTIAL/PLATELET
Abs Immature Granulocytes: 0.01 K/uL (ref 0.00–0.07)
Basophils Absolute: 0 K/uL (ref 0.0–0.1)
Basophils Relative: 1 %
Eosinophils Absolute: 0.1 K/uL (ref 0.0–0.5)
Eosinophils Relative: 2 %
HCT: 37.6 % (ref 36.0–46.0)
Hemoglobin: 12.6 g/dL (ref 12.0–15.0)
Immature Granulocytes: 0 %
Lymphocytes Relative: 47 %
Lymphs Abs: 2.3 K/uL (ref 0.7–4.0)
MCH: 29.6 pg (ref 26.0–34.0)
MCHC: 33.5 g/dL (ref 30.0–36.0)
MCV: 88.3 fL (ref 80.0–100.0)
Monocytes Absolute: 0.3 K/uL (ref 0.1–1.0)
Monocytes Relative: 7 %
Neutro Abs: 2.1 K/uL (ref 1.7–7.7)
Neutrophils Relative %: 43 %
Platelets: 285 K/uL (ref 150–400)
RBC: 4.26 MIL/uL (ref 3.87–5.11)
RDW: 12.6 % (ref 11.5–15.5)
WBC: 4.9 K/uL (ref 4.0–10.5)
nRBC: 0 % (ref 0.0–0.2)

## 2024-07-10 LAB — IRON AND TIBC
Iron: 43 ug/dL (ref 28–170)
Saturation Ratios: 15 % (ref 10.4–31.8)
TIBC: 276 ug/dL (ref 250–450)
UIBC: 233 ug/dL

## 2024-07-10 LAB — FERRITIN: Ferritin: 119 ng/mL (ref 11–307)

## 2024-07-10 NOTE — Addendum Note (Signed)
 Addended by: FERDIE FINE B on: 07/10/2024 03:25 PM   Modules accepted: Orders

## 2024-07-10 NOTE — Telephone Encounter (Signed)
 Pt want to do a phone visit instead of coming in. Pt miss her appt today.

## 2024-07-11 ENCOUNTER — Other Ambulatory Visit: Payer: Self-pay | Admitting: Family Medicine

## 2024-07-11 ENCOUNTER — Ambulatory Visit: Admitting: Family Medicine

## 2024-07-11 DIAGNOSIS — G932 Benign intracranial hypertension: Secondary | ICD-10-CM

## 2024-07-12 ENCOUNTER — Other Ambulatory Visit: Payer: Self-pay | Admitting: Family Medicine

## 2024-07-12 ENCOUNTER — Inpatient Hospital Stay (HOSPITAL_BASED_OUTPATIENT_CLINIC_OR_DEPARTMENT_OTHER): Admitting: Oncology

## 2024-07-12 DIAGNOSIS — D509 Iron deficiency anemia, unspecified: Secondary | ICD-10-CM

## 2024-07-12 NOTE — Telephone Encounter (Signed)
 Copied from CRM #[id number]. Topic: Clinical - Medication Refill >> Jul 12, 2024  2:48 PM Winona R wrote: Medication: pantoprazole  (PROTONIX ) 40 MG tablet  Has the patient contacted their pharmacy? No this medication was not at the pharmacy  (Agent: If no, request that the patient contact the pharmacy for the refill. If patient does not wish to contact the pharmacy document the reason why and proceed with request.) (Agent: If yes, when and what did the pharmacy advise?)  This is the patient's preferred pharmacy:  [facility] - [place], [place] - [address] [place] [address] Phone: [phone] Fax: [phone]  Is this the correct pharmacy for this prescription? Yes If no, delete pharmacy and type the correct one.   Has the prescription been filled recently? No  Is the patient out of the medication? Yes  Has the patient been seen for an appointment in the last year OR does the patient have an upcoming appointment? Yes  Can we respond through MyChart? Yes  Agent: Please be advised that Rx refills may take up to 3 business days. We ask that you follow-up with your pharmacy.

## 2024-07-12 NOTE — Assessment & Plan Note (Addendum)
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 -She is unable to tolerate oral iron . -She was given 1 dose of INFeD  on 03/22/2024. - Reviewed from 07/10/2024 which showed improvement of her hemoglobin and iron  levels following iron  infusion.  Iron  saturations continue to be slightly low at 15%. -We discussed giving a half dose of iron  and redrawing labs in approximately 3 months.  She was agreeable. -Recommend 500 mg IV Venofer x 1 dose.  -Recheck labs in approximately 3 months with labs a few days before.

## 2024-07-12 NOTE — Progress Notes (Signed)
 [facility] OFFICE PROGRESS NOTE  Antonetta Rollene BRAVO, MD  I connected with [name] on 07/12/24 at  1:15 PM EDT by telephone visit and verified that I am speaking with the correct person using two identifiers.   I discussed the limitations, risks, security and privacy concerns of performing an evaluation and management service by telemedicine and the availability of in-person appointments. I also discussed with the patient that there may be a patient responsible charge related to this service. The patient expressed understanding and agreed to proceed.   Other persons participating in the visit and their role in the encounter: NP, Patient    Patient's location: Home Provider's location: Clinic  ASSESSMENT & PLAN:    Assessment & Plan Iron  deficiency anemia, unspecified iron  deficiency anemia type - Etiology felt to be multifactorial due to heavy menstrual cycles and decreased absorption after gastric bypass surgery. -She is unable to tolerate oral iron . -She was given 1 dose of INFeD  on 03/22/2024. - Reviewed from 07/10/2024 which showed improvement of her hemoglobin and iron  levels following iron  infusion.  Iron  saturations continue to be slightly low at 15%. -We discussed giving a half dose of iron  and redrawing labs in approximately 3 months.  She was agreeable. -Recommend 500 mg IV Venofer x 1 dose.  -Recheck labs in approximately 3 months with labs a few days before.   Orders Placed This Encounter  Procedures   CBC with Differential    Standing Status:   Future    Expected Date:   10/12/2024    Expiration Date:   01/10/2025   Iron  and TIBC ([facility] ONLY)    Standing Status:   Future    Expected Date:   10/12/2024    Expiration Date:   01/10/2025   Ferritin    Standing Status:   Future    Expected Date:   10/12/2024    Expiration Date:   01/10/2025   CBC with Differential    Standing Status:   Future    Expected Date:   10/12/2024     Expiration Date:   01/10/2025   Iron  and TIBC ([facility] ONLY)    Standing Status:   Future    Expected Date:   10/12/2024    Expiration Date:   01/10/2025   Ferritin    Standing Status:   Future    Expected Date:   10/12/2024    Expiration Date:   01/10/2025    INTERVAL HISTORY: Patient returns for follow-up for iron  deficiency anemia.  She was given 1 dose of IV iron  on 03/22/2024 with great tolerance.  Reports she felt tremendously better about a week later had a lot more energy.  She is unable to tolerate oral iron .  Appetite is 70% energy levels are 25%.  She denies any pain.  She has headaches on occasion.  During her infusion, she did have some nausea and 1 episode of vomiting and the iron  infusion was slowed down.  She was given some antinausea medicine with resolution of her symptoms.  Denies any additional bleeding.  We reviewed, ferritin, CBC, CMP and iron  panel.  SUMMARY OF HEMATOLOGIC HISTORY: Oncology History   No history exists.     CBC    Component Value Date/Time   WBC 4.9 07/10/2024 1526   RBC 4.26 07/10/2024 1526   HGB 12.6 07/10/2024 1526   HGB 11.6 03/05/2024 0718   HCT 37.6 07/10/2024 1526   HCT 36.9 03/05/2024 0718  PLT 285 07/10/2024 1526   PLT 403 03/05/2024 0718   MCV 88.3 07/10/2024 1526   MCV 84 03/05/2024 0718   MCH 29.6 07/10/2024 1526   MCHC 33.5 07/10/2024 1526   RDW 12.6 07/10/2024 1526   RDW 14.5 03/05/2024 0718   LYMPHSABS 2.3 07/10/2024 1526   LYMPHSABS 1.7 03/05/2024 0718   MONOABS 0.3 07/10/2024 1526   EOSABS 0.1 07/10/2024 1526   EOSABS 0.1 03/05/2024 0718   BASOSABS 0.0 07/10/2024 1526   BASOSABS 0.0 03/05/2024 0718       Latest Ref Rng & Units 04/13/2024    4:30 PM 03/05/2024    7:18 AM 05/27/2023    4:50 PM  CMP  Glucose 70 - 99 mg/dL 84  78  896   BUN 6 - 20 mg/dL 12  11  8    Creatinine 0.44 - 1.00 mg/dL 9.21  9.09  9.32   Sodium 135 - 145 mmol/L 138  141  138   Potassium 3.5 - 5.1 mmol/L 4.2  4.0  4.1    Chloride 98 - 111 mmol/L 104  104  106   CO2 22 - 32 mmol/L 24  21  24    Calcium 8.9 - 10.3 mg/dL 8.9  8.7  8.5   Total Protein 6.0 - 8.5 g/dL  6.6    Total Bilirubin 0.0 - 1.2 mg/dL  0.2    Alkaline Phos 44 - 121 IU/L  98    AST 0 - 40 IU/L  22    ALT 0 - 32 IU/L  12       Lab Results  Component Value Date   FERRITIN 119 07/10/2024   VITAMINB12 751 03/05/2024    There were no vitals filed for this visit.  Review of System:  Review of Systems  Constitutional:  Positive for malaise/fatigue.  Neurological:  Positive for headaches.    Physical Exam: Physical Exam Neurological:     Mental Status: She is alert and oriented to person, place, and time.     I provided 18 minutes of non face-to-face telephone visit time during this encounter, and > 50% was spent counseling as documented under my assessment & plan.  Delon Hope, NP 07/12/2024 2:38 PM

## 2024-07-15 MED ORDER — PANTOPRAZOLE SODIUM 40 MG PO TBEC: DELAYED_RELEASE_TABLET | ORAL | 6 refills | Status: AC

## 2024-07-17 ENCOUNTER — Encounter: Payer: Self-pay | Admitting: Hematology

## 2024-07-19 ENCOUNTER — Ambulatory Visit (INDEPENDENT_AMBULATORY_CARE_PROVIDER_SITE_OTHER)

## 2024-07-19 ENCOUNTER — Ambulatory Visit

## 2024-07-19 ENCOUNTER — Inpatient Hospital Stay

## 2024-07-19 VITALS — BP 133/89 | HR 73 | Temp 97.9°F | Resp 18

## 2024-07-19 DIAGNOSIS — Z23 Encounter for immunization: Secondary | ICD-10-CM

## 2024-07-19 DIAGNOSIS — R112 Nausea with vomiting, unspecified: Secondary | ICD-10-CM

## 2024-07-19 DIAGNOSIS — D509 Iron deficiency anemia, unspecified: Secondary | ICD-10-CM | POA: Diagnosis not present

## 2024-07-19 MED ORDER — ONDANSETRON HCL 4 MG/2ML IJ SOLN
4.0000 mg | Freq: Once | INTRAMUSCULAR | Status: AC
  Administered 2024-07-19: 4 mg via INTRAVENOUS
  Filled 2024-07-19: qty 2

## 2024-07-19 MED ORDER — ACETAMINOPHEN 325 MG PO TABS
650.0000 mg | ORAL_TABLET | Freq: Once | ORAL | Status: AC
  Administered 2024-07-19: 650 mg via ORAL
  Filled 2024-07-19: qty 2

## 2024-07-19 MED ORDER — IRON SUCROSE 300 MG IVPB - SIMPLE MED
300.0000 mg | Freq: Once | Status: AC
  Administered 2024-07-19: 300 mg via INTRAVENOUS
  Filled 2024-07-19: qty 300

## 2024-07-19 MED ORDER — SODIUM CHLORIDE 0.9 % IV SOLN: INTRAVENOUS | Status: DC

## 2024-07-19 MED ORDER — CETIRIZINE HCL 10 MG PO TABS
10.0000 mg | ORAL_TABLET | Freq: Once | ORAL | Status: AC
  Administered 2024-07-19: 10 mg via ORAL
  Filled 2024-07-19: qty 1

## 2024-07-19 NOTE — Progress Notes (Signed)
 Patient is in office today for a nurse visit for Immunization. Patient Injection was given in the  Right deltoid. Patient tolerated injection well.

## 2024-07-19 NOTE — Patient Instructions (Signed)
 [facility] [place] - A DEPT OF [facility]  Discharge Instructions: Thank you for choosing [facility] to provide your oncology and hematology care.  If you have a lab appointment with the Cancer Center - please note that after April 8th, 2024, all labs will be drawn in the cancer center.  You do not have to check in or register with the main entrance as you have in the past but will complete your check-in in the cancer center.  Wear comfortable clothing and clothing appropriate for easy access to any Portacath or PICC line.   We strive to give you quality time with your provider. You may need to reschedule your appointment if you arrive late (15 or more minutes).  Arriving late affects you and other patients whose appointments are after yours.  Also, if you miss three or more appointments without notifying the office, you may be dismissed from the clinic at the provider's discretion.      For prescription refill requests, have your pharmacy contact our office and allow 72 hours for refills to be completed.    Iron Sucrose Injection What is this medication? IRON SUCROSE (EYE ern SOO krose) treats low levels of iron (iron deficiency anemia) in people with kidney disease. Iron is a mineral that plays an important role in making red blood cells, which carry oxygen from your lungs to the rest of your body. This medicine may be used for other purposes; ask your health care provider or pharmacist if you have questions. COMMON BRAND NAME(S): Venofer What should I tell my care team before I take this medication? They need to know if you have any of these conditions: Anemia not caused by low iron levels Heart disease High levels of iron in the blood Kidney disease Liver disease An unusual or allergic reaction to iron, other medications, foods, dyes, or preservatives Pregnant or trying to get pregnant Breastfeeding How should I use this medication? This  medication is infused into a vein. It is given by your care team in a hospital or clinic setting. Talk to your care team about the use of this medication in children. While it may be prescribed for children as young as 2 years for selected conditions, precautions do apply. Overdosage: If you think you have taken too much of this medicine contact a poison control center or emergency room at once. NOTE: This medicine is only for you. Do not share this medicine with others. What if I miss a dose? Keep appointments for follow-up doses. It is important not to miss your dose. Call your care team if you are unable to keep an appointment. What may interact with this medication? Do not take this medication with any of the following: Deferoxamine Dimercaprol Other iron products This medication may also interact with the following: Chloramphenicol Deferasirox This list may not describe all possible interactions. Give your health care provider a list of all the medicines, herbs, non-prescription drugs, or dietary supplements you use. Also tell them if you smoke, drink alcohol, or use illegal drugs. Some items may interact with your medicine. What should I watch for while using this medication? Visit your care team for regular checks on your progress. Tell your care team if your symptoms do not start to get better or if they get worse. You may need blood work done while you are taking this medication. You may need to eat more foods that contain iron. Talk to your care team.  Foods that contain iron include whole grains or cereals, dried fruits, beans, peas, leafy green vegetables, and organ meats (liver, kidney). What side effects may I notice from receiving this medication? Side effects that you should report to your care team as soon as possible: Allergic reactions--skin rash, itching, hives, swelling of the face, lips, tongue, or throat Low blood pressure--dizziness, feeling faint or lightheaded, blurry  vision Shortness of breath Side effects that usually do not require medical attention (report to your care team if they continue or are bothersome): Flushing Headache Joint pain Muscle pain Nausea Pain, redness, or irritation at injection site This list may not describe all possible side effects. Call your doctor for medical advice about side effects. You may report side effects to FDA at 1-800-FDA-1088. Where should I keep my medication? This medication is given in a hospital or clinic. It will not be stored at home. NOTE: This sheet is a summary. It may not cover all possible information. If you have questions about this medicine, talk to your doctor, pharmacist, or health care provider.  2024 Elsevier/Gold Standard (2023-05-17 00:00:00)   To help prevent nausea and vomiting after your treatment, we encourage you to take your nausea medication as directed.  BELOW ARE SYMPTOMS THAT SHOULD BE REPORTED IMMEDIATELY: *FEVER GREATER THAN 100.4 F (38 C) OR HIGHER *CHILLS OR SWEATING *NAUSEA AND VOMITING THAT IS NOT CONTROLLED WITH YOUR NAUSEA MEDICATION *UNUSUAL SHORTNESS OF BREATH *UNUSUAL BRUISING OR BLEEDING *URINARY PROBLEMS (pain or burning when urinating, or frequent urination) *BOWEL PROBLEMS (unusual diarrhea, constipation, pain near the anus) TENDERNESS IN MOUTH AND THROAT WITH OR WITHOUT PRESENCE OF ULCERS (sore throat, sores in mouth, or a toothache) UNUSUAL RASH, SWELLING OR PAIN  UNUSUAL VAGINAL DISCHARGE OR ITCHING   Items with * indicate a potential emergency and should be followed up as soon as possible or go to the Emergency Department if any problems should occur.  Please show the CHEMOTHERAPY ALERT CARD or IMMUNOTHERAPY ALERT CARD at check-in to the Emergency Department and triage nurse.  Should you have questions after your visit or need to cancel or reschedule your appointment, please contact [facility] [place] - A DEPT OF [facility]  [phone]  and follow the prompts.  Office hours are 8:00 a.m. to 4:30 p.m. Monday - Friday. Please note that voicemails left after 4:00 p.m. may not be returned until the following business day.  We are closed weekends and major holidays. You have access to a nurse at all times for urgent questions. Please call the main number to the clinic [phone] and follow the prompts.  For any non-urgent questions, you may also contact your provider using MyChart. We now offer e-Visits for anyone 66 and older to request care online for non-urgent symptoms. For details visit [email or web address].   Also download the MyChart app! Go to the app store, search "MyChart", open the app, select [facility], and log in with your MyChart username and password.

## 2024-07-19 NOTE — Progress Notes (Signed)
 Patient tolerated iron infusion with no complaints voiced.  Peripheral IV site clean and dry with good blood return noted before and after infusion.  Band aid applied.  VSS with discharge and left in satisfactory condition with no s/s of distress noted.

## 2024-07-23 ENCOUNTER — Encounter: Payer: Self-pay | Admitting: Oncology

## 2024-08-20 ENCOUNTER — Encounter: Payer: Self-pay | Admitting: Family Medicine

## 2024-08-20 ENCOUNTER — Ambulatory Visit (INDEPENDENT_AMBULATORY_CARE_PROVIDER_SITE_OTHER): Admitting: Internal Medicine

## 2024-08-20 ENCOUNTER — Ambulatory Visit: Admitting: Family Medicine

## 2024-08-20 ENCOUNTER — Encounter: Payer: Self-pay | Admitting: Oncology

## 2024-08-20 ENCOUNTER — Telehealth: Payer: Self-pay | Admitting: *Deleted

## 2024-08-20 VITALS — BP 132/86 | HR 78 | Temp 97.9°F | Ht 63.5 in | Wt 144.0 lb

## 2024-08-20 DIAGNOSIS — G932 Benign intracranial hypertension: Secondary | ICD-10-CM | POA: Diagnosis not present

## 2024-08-20 DIAGNOSIS — Z9884 Bariatric surgery status: Secondary | ICD-10-CM

## 2024-08-20 DIAGNOSIS — E559 Vitamin D deficiency, unspecified: Secondary | ICD-10-CM | POA: Diagnosis not present

## 2024-08-20 DIAGNOSIS — E663 Overweight: Secondary | ICD-10-CM

## 2024-08-20 DIAGNOSIS — E88819 Insulin resistance, unspecified: Secondary | ICD-10-CM

## 2024-08-20 DIAGNOSIS — Z6825 Body mass index (BMI) 25.0-25.9, adult: Secondary | ICD-10-CM

## 2024-08-20 DIAGNOSIS — R7303 Prediabetes: Secondary | ICD-10-CM | POA: Diagnosis not present

## 2024-08-20 MED ORDER — TIRZEPATIDE 12.5 MG/0.5ML ~~LOC~~ SOAJ: 12.5000 mg | SUBCUTANEOUS | 1 refills | Status: DC

## 2024-08-20 MED ORDER — VITAMIN D (ERGOCALCIFEROL) 1.25 MG (50000 UNIT) PO CAPS: ORAL_CAPSULE | ORAL | 11 refills | Status: DC

## 2024-08-20 NOTE — Telephone Encounter (Signed)
 Prior authorization done via cover my meds for patietns Mounjaro . Waiting on determination.

## 2024-08-20 NOTE — Patient Instructions (Signed)
 Stay on bariatric MVI daily Stay on RX vitamin D  once a week Hydrate well with water  between meals

## 2024-08-20 NOTE — Progress Notes (Signed)
 Office: [phone]  /  Fax: [phone]  WEIGHT SUMMARY AND BIOMETRICS  Starting Date: 06/13/24  Starting Weight: 147lb   Weight Lost Since Last Visit: 3lb   Vitals Temp: 97.9 F (36.6 C) BP: 132/86 Pulse Rate: 78 SpO2: 100 %   Body Composition  Body Fat %: 38 % Fat Mass (lbs): 54.8 lbs Muscle Mass (lbs): 84.6 lbs Total Body Water  (lbs): 72.2 lbs Visceral Fat Rating : 6     HPI  Chief Complaint: OBESITY  Janet [name] is here to discuss her progress with her obesity treatment plan. She is on the following a lower carbohydrate, vegetable and lean protein rich diet plan and states she is following her eating plan approximately 0 % of the time. She states she is exercising 0 minutes 0 times per week.  Interval History:  Since last office visit she is down 3 lb She was last seen for first visit 2 mos ago She is doing well on Zepbound  but did run out of her injections 3 weeks ago Headaches have returned off Zepbound  She is down 2.6 pounds of muscle mass and down 0.4 pounds of body fat since last visit She has been mindful of lean protein intake, denies meal skipping while on Zepbound .  Denies GI upset. She has not been regularly exercising Energy level has improved after IV iron  She plans to start meeting with a trainer She is currently at her nadir weight status post gastric bypass surgery September 2023  Pharmacotherapy: Zepbound  12.5 mg once weekly injection  PHYSICAL EXAM:  Blood pressure 132/86, pulse 78, temperature 97.9 F (36.6 C), height 5' 3.5 (1.613 m), weight 144 lb (65.3 kg), SpO2 100%. Body mass index is 25.11 kg/m.  General: She is healthy appearing, cooperative, alert, well developed, and in no acute distress. PSYCH: Has normal mood, affect and thought process.   Lungs: Normal breathing effort, no conversational dyspnea.  ASSESSMENT AND PLAN  TREATMENT PLAN FOR OBESITY:  Recommended Dietary Goals  [name] is currently in the action stage  of change. As such, her goal is to continue weight management plan. She has agreed to following a lower carbohydrate, vegetable and lean protein rich diet plan.  Behavioral Intervention  We discussed the following Behavioral Modification Strategies today: increasing lean protein intake to established goals, increasing fiber rich foods, increasing water  intake , keeping healthy foods at home, planning for success, and celebration eating strategies.  Additional resources provided today: NA  Recommended Physical Activity Goals  [name] has been advised to work up to 150 minutes of moderate intensity aerobic activity a week and strengthening exercises 2-3 times per week for cardiovascular health, weight loss maintenance and preservation of muscle mass.   She has agreed to [employer] strengthening exercises with a goal of 2-3 sessions a week  and Start aerobic activity with a goal of 150 minutes a week at moderate intensity.   Pharmacotherapy changes for the treatment of obesity: None  ASSOCIATED CONDITIONS ADDRESSED TODAY  Prediabetes Lab Results  Component Value Date   HGBA1C 5.7 (H) 03/05/2024  Switched from Zepbound  over to Mounjaro  for insurance reasons.  She has a known history of prediabetes and insulin  resistance.  Continue to work on reducing added sugar and refined carbohydrates.  Plan to repeat A1c next visit in office  -     Tirzepatide ; Inject 12.5 mg into the skin once a week.  Dispense: 2 mL; Refill: 1  IIH (idiopathic intracranial hypertension) Her headaches are markedly improved while on Zepbound  12.5 mg  once weekly injection.  She has been out of her injections for 3 weeks and headaches have returned.  Will look for the same improvement switching to Mounjaro .  Follow-up with Dr. Janjua as scheduled. -     Tirzepatide ; Inject 12.5 mg into the skin once a week.  Dispense: 2 mL; Refill: 1  Insulin  resistance Improving.  Last fasting insulin  improved to 12.6 on 9//25, down from  previous level of 17.9.  Continue to work on reducing added sugar and refined carbohydrates.  Work on psychologist, occupational to at least 2 to 3 days a week  And increasing walking time. -     Tirzepatide ; Inject 12.5 mg into the skin once a week.  Dispense: 2 mL; Refill: 1  Vitamin D  deficiency Last vitamin D  Lab Results  Component Value Date   VD25OH 40.4 06/13/2024  She did reduce vitamin D  down to 50,000 IU once weekly, previously on vitamin D  50,000 IU twice weekly.  Reviewed labs from last visit.  Energy levels are stable and she remains on a bariatric multivitamin once daily.  -     Vitamin D  (Ergocalciferol ); 1 capsule weekly  Dispense: 8 capsule; Refill: 11  BMI 25.0-25.9,adult  S/P gastric bypass She has done well achieving her target weight with a normal BMI status post gastric bypass surgery with Dr. Tanda on September 2023.  Congratulated her on her success.  Reviewed bariatric labs obtained last visit.  Overweight (BMI 25.0-29.9) Improving     She was informed of the importance of frequent follow up visits to maximize her success with intensive lifestyle modifications for her multiple health conditions.   ATTESTASTION STATEMENTS:  Reviewed by clinician on day of visit: allergies, medications, problem list, medical history, surgical history, family history, social history, and previous encounter notes pertinent to obesity diagnosis.   I have personally spent 30 minutes total time today in preparation, patient care, nutritional counseling and education,  and documentation for this visit, including the following: review of most recent clinical lab tests, prescribing medications/ refilling medications, reviewing medical assistant documentation, review and interpretation of bioimpedence results.     [name], D.O. DABFM, DABOM [facility] [address] [place], [place] [address] [phone]

## 2024-08-22 ENCOUNTER — Other Ambulatory Visit: Payer: Self-pay | Admitting: Family Medicine

## 2024-08-22 MED ORDER — ZEPBOUND 12.5 MG/0.5ML ~~LOC~~ SOLN: 12.5000 mg | SUBCUTANEOUS | 1 refills | Status: DC

## 2024-08-22 NOTE — Telephone Encounter (Signed)
 PA for Mounjaro   has been denied. PA is now complete.      Date: 08/21/2024 [name] [address] [place], [place] [address] An initial coverage request was denied; talk to your doctor about the best option for you. Dear Janet [name]: Request for coverage of Mounjaro  (tirzepatide ) has been denied. A request for prescription coverage for Mounjaro  (tirzepatide ) was recently submitted on your behalf. After careful consideration and review of the information sent to us , this request was not approved. We understand that this decision may not be what you and your doctor expected. This letter and the enclosed information will explain your options and help you decide what to do next. We reviewed all the supporting information sent to us  and used your plan's guidelines to make our decision. Why your request was denied: Your plan only covers this drug when it is used for certain health conditions. Covered use is for type 2 diabetes mellitus. Your plan does not cover the drug for your health condition that your doctor told us  you have. We reviewed the information we had. Your request has been denied. Your doctor can send us  any new or missing information for us  to review. For this drug, you may have to meet other criteria. You can request the drug policy for more details. You can also request other plan documents for your review. We've let your doctor know about this denial, so they will be ready to work with you on your next steps or an alternative treatment plan. [facility] Enclosures cc: Dr. [name] To learn more about the Prescription Coverage Review Process Scan the QR code or visit [email or web address] KEEP READING FOR STEPS YOU AND YOUR DOCTOR CAN TAKE

## 2024-08-22 NOTE — Addendum Note (Signed)
 Addended by: WAYLAN DARICE BRAVO on: 08/22/2024 07:19 AM   Modules accepted: Orders

## 2024-08-27 ENCOUNTER — Encounter: Payer: Self-pay | Admitting: Oncology

## 2024-08-29 ENCOUNTER — Encounter: Payer: Self-pay | Admitting: Oncology

## 2024-09-16 ENCOUNTER — Telehealth: Admitting: Family Medicine

## 2024-09-16 VITALS — Ht 63.5 in | Wt 143.0 lb

## 2024-09-16 DIAGNOSIS — G932 Benign intracranial hypertension: Secondary | ICD-10-CM

## 2024-09-16 DIAGNOSIS — Z8639 Personal history of other endocrine, nutritional and metabolic disease: Secondary | ICD-10-CM

## 2024-09-16 DIAGNOSIS — E559 Vitamin D deficiency, unspecified: Secondary | ICD-10-CM | POA: Diagnosis not present

## 2024-09-16 DIAGNOSIS — Z6824 Body mass index (BMI) 24.0-24.9, adult: Secondary | ICD-10-CM | POA: Diagnosis not present

## 2024-09-16 DIAGNOSIS — Z9884 Bariatric surgery status: Secondary | ICD-10-CM

## 2024-09-16 DIAGNOSIS — R7303 Prediabetes: Secondary | ICD-10-CM | POA: Diagnosis not present

## 2024-09-16 DIAGNOSIS — E88819 Insulin resistance, unspecified: Secondary | ICD-10-CM

## 2024-09-16 MED ORDER — ZEPBOUND 12.5 MG/0.5ML ~~LOC~~ SOLN: 12.5000 mg | SUBCUTANEOUS | 1 refills | Status: DC

## 2024-09-16 MED ORDER — VITAMIN D (ERGOCALCIFEROL) 1.25 MG (50000 UNIT) PO CAPS: ORAL_CAPSULE | ORAL | 1 refills | Status: AC

## 2024-09-16 NOTE — Progress Notes (Signed)
 Office: [phone]  /  Fax: [phone]  WEIGHT SUMMARY AND BIOMETRICS  No data recorded No data recorded  No data recorded  No data recorded No data recorded I connected with  Janet [name] on 09/16/24 by a video and audio enabled telemedicine application and verified that I am speaking with the correct person using two identifiers.  Patient Location: Home  Provider Location: Office/Clinic  Persons Participating in Visit: Patient.  I discussed the limitations of evaluation and management by telemedicine. The patient expressed understanding and agreed to proceed.   Vital Signs: Because this visit was a virtual/telehealth visit, some criteria may be missing or patient reported. Any vitals not documented were not able to be obtained and vitals that have been documented are patient reported.      HPI  Chief Complaint: OBESITY  Janet [name] is here to discuss her progress with her obesity treatment plan. She is on the following a lower carbohydrate, vegetable and lean protein rich diet plan and states she is following her eating plan approximately 80 % of the time. She states she is exercising 60 minutes 5 times per week.   Interval History:  Since last office visit she is down 1 lb She is back on Zebpound 12.5 mg weekly after being off for a month She has improved satiety and a reduction of food noise without meal skipping or GI upset She has seen a reduction in HA frequency with IIH She did have a bad HA yesterday She flew for a work trip last week Improving hydration Working out with a trainer 3 days/ wk with weight training She is net down 4 lb in 3 mos She has done very well s/p RYGB surgery > 2 years ago, up 4 lb from her nadir weight  Pharmacotherapy: Zepbound  12.5 mg weekly  PHYSICAL EXAM:  Height 5' 3.5 (1.613 m), weight 143 lb (64.9 kg). Body mass index is 24.93 kg/m.  General: She is healthy appearing,  cooperative, alert, well developed, and in no  acute distress. PSYCH: Has normal mood, affect and thought process.   Lungs: Normal breathing effort, no conversational dyspnea.   ASSESSMENT AND PLAN  TREATMENT PLAN FOR OBESITY:  Recommended Dietary Goals  Janet [name] is currently in the action stage of change. As such, her goal is to continue weight management plan. She has agreed to following a lower carbohydrate, vegetable and lean protein rich diet plan.  Behavioral Intervention  We discussed the following Behavioral Modification Strategies today: increasing lean protein intake to established goals, increasing fiber rich foods, increasing water  intake , keeping healthy foods at home, continue to practice mindfulness when eating, and planning for success.  Additional resources provided today: NA  Recommended Physical Activity Goals  [name] has been advised to work up to 150 minutes of moderate intensity aerobic activity a week and strengthening exercises 2-3 times per week for cardiovascular health, weight loss maintenance and preservation of muscle mass.   She has agreed to Continue current level of physical activity   Pharmacotherapy changes for the treatment of obesity: none  ASSOCIATED CONDITIONS ADDRESSED TODAY  IIH (idiopathic intracranial hypertension) Headache frequency has reduced on Zepbound  12.5 mg weekly She is encouraged to follow up with Dr Janjua for next steps Her body weight is at goal  History of obesity -     Zepbound ; Inject 12.5 mg into the skin once a week.  Dispense: 2 mL; Refill: 1 Doing well with adequate satiety on Zepbound  12.5 mg weekly  Prediabetes Lab  Results  Component Value Date   HGBA1C 5.7 (H) 03/05/2024  Repeat A1c in Jan  S/P gastric bypass Taking bariatric MVI daily Drinking water  outside of mealtime Added in a protein shake and fiber supplement daily  Insulin  resistance Improved Fasting insulin  dropped from 17.9--> 12.6 Continue to limit sugar intake Doing well with both  cardio and weight training 5 days/ wk Repeat numbers next visit  Vitamin D  deficiency Last vitamin D  Lab Results  Component Value Date   VD25OH 40.4 06/13/2024  Energy level has improved on RX vitamin D  weekly Repeat lab in Jan      She was informed of the importance of frequent follow up visits to maximize her success with intensive lifestyle modifications for her multiple health conditions.   ATTESTASTION STATEMENTS:  Reviewed by clinician on day of visit: allergies, medications, problem list, medical history, surgical history, family history, social history, and previous encounter notes pertinent to obesity diagnosis.   I have personally spent 24 minutes total time today in preparation, patient care, nutritional counseling and education,  and documentation for this visit, including the following: review of most recent clinical lab tests, prescribing medications/ refilling medications, reviewing medical assistant documentation, review and interpretation of bioimpedence results.     [name], D.O. DABFM, DABOM [facility] [address] [place], [place] [address] [phone]

## 2024-09-23 ENCOUNTER — Other Ambulatory Visit: Payer: Self-pay

## 2024-09-23 ENCOUNTER — Inpatient Hospital Stay
Admission: RE | Admit: 2024-09-23 | Discharge: 2024-09-23 | Disposition: A | Payer: Self-pay | Source: Ambulatory Visit | Attending: Neurosurgery | Admitting: Neurosurgery

## 2024-09-23 DIAGNOSIS — Z049 Encounter for examination and observation for unspecified reason: Secondary | ICD-10-CM

## 2024-09-24 ENCOUNTER — Encounter: Payer: Self-pay | Admitting: Neurosurgery

## 2024-09-24 ENCOUNTER — Ambulatory Visit: Admitting: Neurosurgery

## 2024-09-24 VITALS — BP 122/83 | HR 80 | Temp 97.8°F | Ht 64.0 in | Wt 141.2 lb

## 2024-09-24 DIAGNOSIS — G932 Benign intracranial hypertension: Secondary | ICD-10-CM

## 2024-09-24 NOTE — Progress Notes (Signed)
 38 year old lady with history of idiopathic intracranial hypertension and a Chiari malformation.  In 2024 she underwent posterior fossa decompression for Chiari.  She did very well but then developed a headache again which we suspected was from idiopathic intracranial hypertension.  She has tried multiple medications to no avail.  In the meantime she has been very successful with losing weight and only weighs 143 pounds now.  She is actively going to the gym and says that when she takes Zepbound  for weight loss, her headache get better.  When she stopped it, her headaches got worse.  Last week she flew to [place]  and after that felt pressure in the back of her head.  She is concerned that her intracranial pressures are back up again.  I told her that it is very unfortunate that despite all her efforts with weight loss which have been largely successful, she is continuing to have these headaches.  The only way to know for sure whether these headaches are from a pressure or from a nonpressure related component, is to do a lumbar puncture.  This will help us  elucidate whether there is a pressure component and then we will go from there.  She is eager to do this.

## 2024-10-07 ENCOUNTER — Encounter: Payer: Self-pay | Admitting: *Deleted

## 2024-10-11 ENCOUNTER — Ambulatory Visit: Admitting: Neurosurgery

## 2024-10-14 ENCOUNTER — Telehealth: Admitting: Family Medicine

## 2024-10-14 VITALS — Ht 63.5 in | Wt 137.0 lb

## 2024-10-14 DIAGNOSIS — R7303 Prediabetes: Secondary | ICD-10-CM | POA: Diagnosis not present

## 2024-10-14 DIAGNOSIS — G932 Benign intracranial hypertension: Secondary | ICD-10-CM | POA: Diagnosis not present

## 2024-10-14 DIAGNOSIS — Z9884 Bariatric surgery status: Secondary | ICD-10-CM | POA: Diagnosis not present

## 2024-10-14 DIAGNOSIS — Z8639 Personal history of other endocrine, nutritional and metabolic disease: Secondary | ICD-10-CM | POA: Diagnosis not present

## 2024-10-14 DIAGNOSIS — E559 Vitamin D deficiency, unspecified: Secondary | ICD-10-CM | POA: Diagnosis not present

## 2024-10-14 DIAGNOSIS — D509 Iron deficiency anemia, unspecified: Secondary | ICD-10-CM | POA: Diagnosis not present

## 2024-10-14 DIAGNOSIS — Z6823 Body mass index (BMI) 23.0-23.9, adult: Secondary | ICD-10-CM | POA: Diagnosis not present

## 2024-10-14 DIAGNOSIS — E88819 Insulin resistance, unspecified: Secondary | ICD-10-CM

## 2024-10-14 MED ORDER — TIRZEPATIDE-WEIGHT MANAGEMENT 10 MG/0.5ML ~~LOC~~ SOLN: 10.0000 mg | SUBCUTANEOUS | 2 refills | Status: AC

## 2024-10-14 NOTE — Progress Notes (Signed)
 "  Office: [phone]  /  Fax: [phone]  WEIGHT SUMMARY AND BIOMETRICS  No data recorded No data recorded  No data recorded  No data recorded No data recorded  I connected with  Janet [name] on 10/14/2024 by a video and audio enabled telemedicine application and verified that I am speaking with the correct person using two identifiers.  Patient Location: Other:  at work, alone  Provider Location: Office/Clinic  Persons Participating in Visit: Patient.  I discussed the limitations of evaluation and management by telemedicine. The patient expressed understanding and agreed to proceed.   Vital Signs: Because this visit was a virtual/telehealth visit, some criteria may be missing or patient reported. Any vitals not documented were not able to be obtained and vitals that have been documented are patient reported.     HPI  Chief Complaint: OBESITY  Janet [name] is here to discuss her progress with her obesity treatment plan. She is on the following a lower carbohydrate, vegetable and lean protein rich diet plan and states she is following her eating plan approximately 100 % of the time. She states she is exercising 45 minutes 5 times per week.  Interval History:  Since last office visit she is down 6 lb This gives her a net weight loss of 10 lb in 4 mos She is now 2 lb lower than per previous nadir weight s/p RYGB surgery She restarted Zepbound  last visit due to resurgence of headaches with IIH off Zepbound  She is still having some headaches with vision changes She is set up for f/u with Dr Rosslyn 1/13 for LP She has received 2 IV iron  infusions which helped short term with energy evles She is working out 3-5 x a week She is mindful of food choices and denies feelings of low sugar on Zepbound  or GI upset  Pharmacotherapy: Zebound 12.5 mg weekly  PHYSICAL EXAM:  Height 5' 3.5 (1.613 m), weight 137 lb (62.1 kg), last menstrual period 09/13/2024. Body mass index is 23.89  kg/m.  General: She is healthy appearing,  cooperative, alert, well developed, and in no acute distress. PSYCH: Has normal mood, affect and thought process.   Lungs: Normal breathing effort, no conversational dyspnea.   ASSESSMENT AND PLAN  TREATMENT PLAN FOR OBESITY:  Recommended Dietary Goals  Janet [name] is currently in the action stage of change. As such, her goal is to continue weight management plan. She has agreed to practicing portion control and making smarter food choices, such as increasing vegetables and decreasing simple carbohydrates.  Behavioral Intervention  We discussed the following Behavioral Modification Strategies today: increasing fiber rich foods, avoiding skipping meals, increasing water  intake , keeping healthy foods at home, work on managing stress, creating time for self-care and relaxation, and continue to practice mindfulness when eating.  Additional resources provided today: NA  Recommended Physical Activity Goals  Janet [name] has been advised to work up to 150 minutes of moderate intensity aerobic activity a week and strengthening exercises 2-3 times per week for cardiovascular health, weight loss maintenance and preservation of muscle mass.   She has agreed to Continue current level of physical activity   Pharmacotherapy changes for the treatment of obesity: reduce Zepbound  to 10 mg weekly injection  ASSOCIATED CONDITIONS ADDRESSED TODAY  History of obesity Improving, at goal weight with normal BMI Continue mindful eating, portion control s/p RYGB surgery Reduce Zepbound  dose for maintenance phase to 10 mg weekly  IIH (idiopathic intracranial hypertension) Worsening with continued headaches even back on Zepbound  weekly  Scheduled for LP with Dr Rosslyn 1/13.  Continue Diamox  as directed  Insulin  resistance Actively working on diet and exercise changes Doing well on Zepbound  Repeat lab, order planced  Prediabetes Lab Results  Component Value  Date   HGBA1C 5.7 (H) 03/05/2024   Doing great with weight at goal Repeat A1c, order placed  S/P gastric bypass Has volume control with gastric bypass, taking MVI, calcium and vitamin D  as directed Food impulse control/ cravings improved with use of Zepbound  Work on maintaining current body weight Continue weight training 3-4 x a week  Iron  deficiency anemia, unspecified iron  deficiency anemia type Has follow up with Delon Hope NP 1/16 to repeat iron  levels s/p 2 IV iron  infusions (first with N/V, second was better) with no big improvements in energy levels.       She was informed of the importance of frequent follow up visits to maximize her success with intensive lifestyle modifications for her multiple health conditions.   ATTESTASTION STATEMENTS:  Reviewed by clinician on day of visit: allergies, medications, problem list, medical history, surgical history, family history, social history, and previous encounter notes pertinent to obesity diagnosis.   I have personally spent 30 minutes total time today in preparation, patient care, nutritional counseling and education,  and documentation for this visit, including the following: review of most recent clinical lab tests, prescribing medications/ refilling medications, reviewing medical assistant documentation, review and interpretation of bioimpedence results.     [name], D.O. DABFM, DABOM [facility] [address] [place], [place] [address] [phone]  "

## 2024-10-18 ENCOUNTER — Inpatient Hospital Stay: Attending: Oncology

## 2024-10-18 ENCOUNTER — Ambulatory Visit (HOSPITAL_COMMUNITY)
Admission: RE | Admit: 2024-10-18 | Discharge: 2024-10-18 | Disposition: A | Source: Ambulatory Visit | Attending: Neurosurgery | Admitting: Neurosurgery

## 2024-10-18 DIAGNOSIS — G932 Benign intracranial hypertension: Secondary | ICD-10-CM | POA: Insufficient documentation

## 2024-10-18 DIAGNOSIS — R519 Headache, unspecified: Secondary | ICD-10-CM

## 2024-10-18 HISTORY — PX: IR LUMBAR PUNCTURE: IMG944

## 2024-10-18 MED ORDER — LIDOCAINE HCL (PF) 1 % IJ SOLN
INTRAMUSCULAR | Status: AC
  Filled 2024-10-18: qty 30

## 2024-10-18 MED ORDER — IOHEXOL 180 MG/ML  SOLN
10.0000 mL | Freq: Once | INTRAMUSCULAR | Status: AC | PRN
  Administered 2024-10-18: 3 mL via EPIDURAL

## 2024-10-18 MED ORDER — LIDOCAINE HCL (PF) 1 % IJ SOLN
30.0000 mL | Freq: Once | INTRAMUSCULAR | Status: AC
  Administered 2024-10-18: 3 mL via INTRADERMAL

## 2024-10-18 NOTE — Brief Op Note (Signed)
" °  NEUROSURGERY BRIEF OP NOTE   PREOP DX: IIH  POSTOP DX: Same  PROCEDURE: LP  SURGEON: Nancyann LULLA Burns   ANESTHESIA: None  EBL: 0  COMPLICATIONS: none  CONDITION: Stable  FINDINGS:  OP low = 0  Nancyann LULLA Burns  @today @ 10:45 AM  "

## 2024-10-20 ENCOUNTER — Encounter (HOSPITAL_COMMUNITY): Payer: Self-pay

## 2024-10-20 ENCOUNTER — Emergency Department (HOSPITAL_COMMUNITY)
Admission: EM | Admit: 2024-10-20 | Discharge: 2024-10-20 | Disposition: A | Discharge status: Home / Self Care | Source: Ambulatory Visit | Attending: Emergency Medicine | Admitting: Emergency Medicine

## 2024-10-20 ENCOUNTER — Other Ambulatory Visit: Payer: Self-pay

## 2024-10-20 DIAGNOSIS — D72819 Decreased white blood cell count, unspecified: Secondary | ICD-10-CM | POA: Insufficient documentation

## 2024-10-20 DIAGNOSIS — Z91040 Latex allergy status: Secondary | ICD-10-CM | POA: Diagnosis not present

## 2024-10-20 DIAGNOSIS — R519 Headache, unspecified: Secondary | ICD-10-CM | POA: Diagnosis present

## 2024-10-20 DIAGNOSIS — I10 Essential (primary) hypertension: Secondary | ICD-10-CM | POA: Diagnosis not present

## 2024-10-20 LAB — COMPREHENSIVE METABOLIC PANEL WITH GFR
ALT: 11 U/L (ref 0–44)
AST: 19 U/L (ref 15–41)
Albumin: 4.3 g/dL (ref 3.5–5.0)
Alkaline Phosphatase: 63 U/L (ref 38–126)
Anion gap: 13 (ref 5–15)
BUN: 9 mg/dL (ref 6–20)
CO2: 22 mmol/L (ref 22–32)
Calcium: 9 mg/dL (ref 8.9–10.3)
Chloride: 104 mmol/L (ref 98–111)
Creatinine, Ser: 0.8 mg/dL (ref 0.44–1.00)
GFR, Estimated: >60 mL/min
Glucose, Bld: 82 mg/dL (ref 70–99)
Potassium: 4.2 mmol/L (ref 3.5–5.1)
Sodium: 139 mmol/L (ref 135–145)
Total Bilirubin: 0.4 mg/dL (ref 0.0–1.2)
Total Protein: 7.1 g/dL (ref 6.5–8.1)

## 2024-10-20 LAB — CBC WITH DIFFERENTIAL/PLATELET
Abs Immature Granulocytes: 0.01 K/uL (ref 0.00–0.07)
Basophils Absolute: 0 K/uL (ref 0.0–0.1)
Basophils Relative: 1 %
Eosinophils Absolute: 0.1 K/uL (ref 0.0–0.5)
Eosinophils Relative: 2 %
HCT: 41.1 % (ref 36.0–46.0)
Hemoglobin: 14 g/dL (ref 12.0–15.0)
Immature Granulocytes: 0 %
Lymphocytes Relative: 39 %
Lymphs Abs: 1.5 K/uL (ref 0.7–4.0)
MCH: 29.7 pg (ref 26.0–34.0)
MCHC: 34.1 g/dL (ref 30.0–36.0)
MCV: 87.1 fL (ref 80.0–100.0)
Monocytes Absolute: 0.3 K/uL (ref 0.1–1.0)
Monocytes Relative: 6 %
Neutro Abs: 2 K/uL (ref 1.7–7.7)
Neutrophils Relative %: 52 %
Platelets: 269 K/uL (ref 150–400)
RBC: 4.72 MIL/uL (ref 3.87–5.11)
RDW: 11.7 % (ref 11.5–15.5)
WBC: 3.9 K/uL — ABNORMAL LOW (ref 4.0–10.5)
nRBC: 0 % (ref 0.0–0.2)

## 2024-10-20 LAB — HCG, SERUM, QUALITATIVE: Preg, Serum: NEGATIVE

## 2024-10-20 MED ORDER — MAGNESIUM SULFATE 2 GM/50ML IV SOLN
2.0000 g | Freq: Once | INTRAVENOUS | Status: AC
  Administered 2024-10-20: 2 g via INTRAVENOUS
  Filled 2024-10-20: qty 50

## 2024-10-20 MED ORDER — SODIUM CHLORIDE 0.9 % IV BOLUS
1000.0000 mL | Freq: Once | INTRAVENOUS | Status: AC
  Administered 2024-10-20: 1000 mL via INTRAVENOUS

## 2024-10-20 MED ORDER — KETOROLAC TROMETHAMINE 15 MG/ML IJ SOLN
15.0000 mg | Freq: Once | INTRAMUSCULAR | Status: AC
  Administered 2024-10-20: 15 mg via INTRAVENOUS
  Filled 2024-10-20: qty 1

## 2024-10-20 MED ORDER — DEXAMETHASONE SOD PHOSPHATE PF 10 MG/ML IJ SOLN
10.0000 mg | Freq: Once | INTRAMUSCULAR | Status: AC
  Administered 2024-10-20: 10 mg via INTRAVENOUS
  Filled 2024-10-20: qty 1

## 2024-10-20 MED ORDER — PROCHLORPERAZINE EDISYLATE 10 MG/2ML IJ SOLN
10.0000 mg | Freq: Once | INTRAMUSCULAR | Status: AC
  Administered 2024-10-20: 10 mg via INTRAVENOUS
  Filled 2024-10-20: qty 2

## 2024-10-20 MED ORDER — DIPHENHYDRAMINE HCL 50 MG/ML IJ SOLN
25.0000 mg | Freq: Once | INTRAMUSCULAR | Status: AC
  Administered 2024-10-20: 25 mg via INTRAVENOUS
  Filled 2024-10-20: qty 1

## 2024-10-20 NOTE — ED Provider Notes (Signed)
 " [place] EMERGENCY DEPARTMENT AT [facility] Provider Note   CSN: [id number] Arrival date & time: 10/20/24  1247     Patient presents with: Headache   Janet [name] is a 39 y.o. female.   Patient is a 39 year old female with a past medical history of idiopathic intracranial hypertension, Chiari malformation, status post decompression in 2024 who presents to the emergency department the chief complaint of headache and photophobia.  Patient had an LP performed with interventional radiology 2 days ago and notes since that time she has been having worsening headache.  She notes that the headache is better with lying flat and worsens with standing.  Patient notes that this is the first time she has had a headache post LP and notes that she has had 4 previous LPs in the past.  Patient notes that there has been no thunderclap nature of the headache, syncope, numbness, paresthesias, weakness.  She has had no associated fever or chills.  There is been no associated confusion.  She does admit to some pain radiating into her neck.  She does admit to poor p.o. intake but has had no nausea or vomiting.   Headache      Prior to Admission medications  Medication Sig Start Date End Date Taking? Authorizing Provider  acetaZOLAMIDE  (DIAMOX ) 250 MG tablet Take 1/2 pill in the morning and 1 pill at bedtime. If side effects can take all in the evening. Also try to get to 2 pills a day preferably one pill twice daily but could be all at night if can't take pill in day due to side effects. 07/25/22   Ines Onetha NOVAK, MD  Calcium Carb-Cholecalciferol 500-10 MG-MCG TABS Take 1 tablet by mouth in the morning, at noon, and at bedtime.    [provider]  clotrimazole -betamethasone  (LOTRISONE ) cream Apply 1 Application topically 2 (two) times daily. 02/10/23   Antonetta Rollene BRAVO, MD  loratadine  (CLARITIN ) 10 MG tablet TAKE 1 TABLET(10 MG) BY MOUTH DAILY 01/11/24   Antonetta Rollene BRAVO, MD   Multiple Vitamin (MULTIVITAMIN) tablet Take 1 tablet by mouth daily.    [provider]  pantoprazole  (PROTONIX ) 40 MG tablet TAKE 1 TABLET BY MOUTH ONCE daily AS NEEDED FOR heartburn 07/15/24   Antonetta Rollene BRAVO, MD  sodium fluoride (FLUORISHIELD) 1.1 % GEL dental gel Take by mouth as directed. 07/03/24   [provider]  tirzepatide  10 MG/0.5ML injection vial Inject 10 mg into the skin once a week. 10/14/24   Bowen, Darice BRAVO, DO  Vitamin D , Ergocalciferol , (DRISDOL ) 1.25 MG (50000 UNIT) CAPS capsule 1 capsule weekly 09/16/24   Bowen, Darice BRAVO, DO    Allergies: Penicillins, Wound dressing adhesive, Latex, and Neomycin    Review of Systems  Neurological:  Positive for headaches.  All other systems reviewed and are negative.   Updated Vital Signs BP (!) 139/95   Pulse 76   Temp 98.5 F (36.9 C)   Resp 20   Ht 5' 3.5 (1.613 m)   Wt 59 kg   LMP 10/09/2024   SpO2 100%   BMI 22.67 kg/m   Physical Exam Vitals and nursing note reviewed.  Constitutional:      General: She is not in acute distress.    Appearance: Normal appearance. She is not ill-appearing.  HENT:     Head: Normocephalic and atraumatic.     Nose: Nose normal.     Mouth/Throat:     Mouth: Mucous membranes are moist.  Eyes:  General: No visual field deficit.    Extraocular Movements: Extraocular movements intact.     Right eye: Normal extraocular motion and no nystagmus.     Left eye: Normal extraocular motion and no nystagmus.     Conjunctiva/sclera: Conjunctivae normal.     Pupils: Pupils are equal, round, and reactive to light.     Right eye: Pupil is round and reactive.     Left eye: Pupil is round and reactive.  Neck:     Meningeal: Brudzinski's sign and Kernig's sign absent.  Cardiovascular:     Rate and Rhythm: Normal rate and regular rhythm.     Pulses: Normal pulses.     Heart sounds: Normal heart sounds. No murmur heard.    No gallop.  Pulmonary:     Effort: Pulmonary effort is  normal. No respiratory distress.     Breath sounds: Normal breath sounds. No stridor. No wheezing, rhonchi or rales.  Abdominal:     General: Abdomen is flat. Bowel sounds are normal.     Palpations: Abdomen is soft.  Musculoskeletal:        General: Normal range of motion.     Cervical back: Normal range of motion and neck supple. No rigidity.  Skin:    General: Skin is warm and dry.     Findings: No rash.  Neurological:     General: No focal deficit present.     Mental Status: She is alert and oriented to person, place, and time. Mental status is at baseline.     GCS: GCS eye subscore is 4. GCS verbal subscore is 5. GCS motor subscore is 6.     Cranial Nerves: No cranial nerve deficit, dysarthria or facial asymmetry.     Sensory: No sensory deficit.     Motor: No weakness.     Coordination: Coordination normal.     Gait: Gait normal.  Psychiatric:        Mood and Affect: Mood normal. Mood is not anxious or depressed.        Behavior: Behavior normal. Behavior is not agitated.        Thought Content: Thought content normal.        Cognition and Memory: Cognition is not impaired.        Judgment: Judgment normal.     (all labs ordered are listed, but only abnormal results are displayed) Labs Reviewed  COMPREHENSIVE METABOLIC PANEL WITH GFR  CBC WITH DIFFERENTIAL/PLATELET  HCG, SERUM, QUALITATIVE    EKG: None  Radiology: No results found.   Procedures   Medications Ordered in the ED  sodium chloride  0.9 % bolus 1,000 mL (has no administration in time range)  prochlorperazine  (COMPAZINE ) injection 10 mg (has no administration in time range)  diphenhydrAMINE  (BENADRYL ) injection 25 mg (has no administration in time range)  ketorolac  (TORADOL ) 15 MG/ML injection 15 mg (has no administration in time range)  dexamethasone  (DECADRON ) injection 10 mg (has no administration in time range)  magnesium  sulfate IVPB 2 g 50 mL (has no administration in time range)                                     Medical Decision Making Amount and/or Complexity of Data Reviewed Labs: ordered.  Risk Prescription drug management.   This patient presents to the ED for concern of headache differential diagnosis includes post LP headache, CSF leak, migraine, subarachnoid hemorrhage, space-occupying  lesion, meningitis, encephalitis    Additional history obtained:  Additional history obtained from medical records External records from outside source obtained and reviewed including medical records   Lab Tests:  I Ordered, and personally interpreted labs.  The pertinent results include: Mild leukopenia, no anemia, normal kidney function liver function, unremarkable electrolytes    Medicines ordered and prescription drug management:  I ordered medication including Decadron , Compazine , Benadryl , Toradol , IV fluids, magnesium  for headache Reevaluation of the patient after these medicines showed that the patient improved I have reviewed the patients home medicines and have made adjustments as needed   Problem List / ED Course:  Patient is doing much better at this time and is stable for discharge home.  Headache has resolved with treatment in the emergency department.  Do not suspect that patient needs to be transferred for blood patch at this point.  She already has close follow-up with neurology in 2 days and she was directed to keep this appointment.  She has no concerning neurological deficits at this time.  She has no indication for subarachnoid hemorrhage, space-occupying lesion, meningitis, encephalitis.  Do not suspect that any imaging is warranted at this time.  Strict return precautions were provided for any new or worsening symptoms.  Patient voiced understanding and had no additional questions.   Social Determinants of Health:  None        Final diagnoses:  None    ED Discharge Orders     None          Daralene Lonni BIRCH, [place] 10/20/24  1454  "

## 2024-10-20 NOTE — Discharge Instructions (Signed)
 Please follow-up closely with your neurologist as scheduled in 2 days.  Return to the emergency department immediately for any new or worsening symptoms.

## 2024-10-20 NOTE — ED Notes (Signed)
 Pt/family received d/c paperwork at this time. After going over the paperwork any questions, comments, or concerns were answered to the best of this nurse's knowledge. The pt/family verbally acknowledged the teachings/instructions.

## 2024-10-20 NOTE — ED Triage Notes (Signed)
 Pt reports headache starting after having a lumbar puncture on Friday at [facility]. Pt states not taking in fluids no vomiting. Pt states generalized headache with light sensitivity.

## 2024-10-21 ENCOUNTER — Telehealth: Payer: Self-pay

## 2024-10-21 NOTE — Telephone Encounter (Signed)
 I called patient this morning to check in after her calls to the answering service and ED visit over the weekend.  Patient rates 10/10 headache. She states that the pain is an intense pain behind the eyes with radiation to the neck and back.  She has not taken any medication at this point but is considering taking some tylenol .   Patient is positive for a positional headache. She states that it gets better when laying down.   She has been having pain mostly behind the eyes.  After her visit to the ER, her headache came back around 11pm after returning home and taking a nap.   I told her I would forward this message to Dr. Janjua and Sam NP to get direction for next steps in her care.

## 2024-10-22 ENCOUNTER — Encounter: Payer: Self-pay | Admitting: Neurosurgery

## 2024-10-22 ENCOUNTER — Ambulatory Visit: Admitting: Neurosurgery

## 2024-10-22 VITALS — BP 148/100 | HR 84 | Temp 98.0°F | Ht 64.0 in | Wt 136.0 lb

## 2024-10-22 DIAGNOSIS — G96810 Intracranial hypotension, unspecified: Secondary | ICD-10-CM | POA: Diagnosis not present

## 2024-10-22 DIAGNOSIS — G932 Benign intracranial hypertension: Secondary | ICD-10-CM | POA: Diagnosis not present

## 2024-10-22 NOTE — Progress Notes (Unsigned)
 39 year old lady with a history of a Chiari malformation which was operated on successfully.  Patient had resolution of her headaches however headaches returned and the lumbar puncture demonstrated elevated CSF pressures with the last lumbar puncture demonstrating opening pressure of 22 cm.  We treated her with medical management which was arduous given the side effects of the medication.  A few months ago, we reattempted a lumbar puncture but because of the painful nature of it, the procedure was aborted and no pressures were recorded.  With persistent symptoms we recently redid the lumbar puncture and this time the opening pressure was 0 cm.  This was checked for veracity and low ring the needle tubing resulted in drainage confirming the correct placement.  Patient admits that her headaches that she has now are significantly different than she used to have after the surgery: They are much more positional in nature and get worse as the day goes along and are best in the morning.  I explained the concept of spontaneous intracranial hypotension after sustained elevated intracranial pressures.  I told her about the options of laying flat for a while as an empiric treatment, a blind epidural blood patch or a full workup with a CT myelogram.  I went over the risks and benefits of these.  She decided that she wants to lay flat and is going to try doing that starting next week.  I emphasized the need to avoid any Valsalva maneuvers and to keep her bowels soft.  We went over how to do's and don'ts and I will have a video visit with her in 2 weeks and see how she is doing.

## 2024-10-22 NOTE — Progress Notes (Unsigned)
 Advised pt to see PCP about bp being high. Pt is in pain at a level 8.

## 2024-10-23 MED ORDER — BUTALBITAL-APAP-CAFFEINE 50-325-40 MG PO TABS: 1.0000 | ORAL_TABLET | Freq: Four times a day (QID) | ORAL | 3 refills | Status: AC | PRN

## 2024-10-25 ENCOUNTER — Inpatient Hospital Stay: Admitting: Oncology

## 2024-10-30 ENCOUNTER — Other Ambulatory Visit: Payer: Self-pay

## 2024-10-30 ENCOUNTER — Inpatient Hospital Stay
Admission: RE | Admit: 2024-10-30 | Discharge: 2024-10-30 | Disposition: A | Payer: Self-pay | Source: Ambulatory Visit | Attending: Neurosurgery | Admitting: Neurosurgery

## 2024-10-30 DIAGNOSIS — Z049 Encounter for examination and observation for unspecified reason: Secondary | ICD-10-CM

## 2024-11-01 ENCOUNTER — Telehealth: Admitting: Neurosurgery

## 2024-11-01 DIAGNOSIS — G96810 Intracranial hypotension, unspecified: Secondary | ICD-10-CM | POA: Diagnosis not present

## 2024-11-01 NOTE — Progress Notes (Signed)
 Virtual Visit via Video Note  I connected with Janet [name] on 11/01/24 at  1:20 PM EST by a video enabled telemedicine application and verified that I am speaking with the correct person using two identifiers.  Location: Patient: Work Provider: Clinic   I discussed the limitations of evaluation and management by telemedicine and the availability of in person appointments. The patient expressed understanding and agreed to proceed.  39 year old lady with a history of a Chiari decompression who had recurrent headaches with lumbar puncture demonstrating opening pressure of 22 cm.  Patient was doing well however recently returned with recurrent pains.  Lumbar puncture demonstrated an extremely low CSF pressure of 0 cm and we explained to the patient the concept of intracranial hypotension.  I recommended a week of bedrest with avoiding of Valsalva maneuvers but she was only able to do that for 3 days after which she returned to work.  Fortunately this resulted in significant improvement and she says that her headaches have now about a 5 out of 10 and 50% better.  She finds it much more tolerable.  I again reminded her to avoid Valsalva maneuvers and to keep her bowels soft and I will talk to her again in 3 weeks to see how she is doing.    I discussed the assessment and treatment plan with the patient. The patient was provided an opportunity to ask questions and all were answered. The patient agreed with the plan and demonstrated an understanding of the instructions.   The patient was advised to call back or seek an in-person evaluation if the symptoms worsen or if the condition fails to improve as anticipated.  I provided 10 minutes of non-face-to-face time during this encounter.   Lubertha Leite, MD

## 2024-11-08 ENCOUNTER — Ambulatory Visit: Admitting: Family Medicine

## 2024-11-08 VITALS — BP 121/88 | HR 76 | Ht 64.0 in | Wt 133.0 lb

## 2024-11-08 DIAGNOSIS — D509 Iron deficiency anemia, unspecified: Secondary | ICD-10-CM

## 2024-11-08 DIAGNOSIS — G932 Benign intracranial hypertension: Secondary | ICD-10-CM | POA: Diagnosis not present

## 2024-11-08 DIAGNOSIS — M958 Other specified acquired deformities of musculoskeletal system: Secondary | ICD-10-CM

## 2024-11-08 DIAGNOSIS — R790 Abnormal level of blood mineral: Secondary | ICD-10-CM | POA: Diagnosis not present

## 2024-11-08 DIAGNOSIS — E559 Vitamin D deficiency, unspecified: Secondary | ICD-10-CM | POA: Diagnosis not present

## 2024-11-08 DIAGNOSIS — R519 Headache, unspecified: Secondary | ICD-10-CM

## 2024-11-08 DIAGNOSIS — R030 Elevated blood-pressure reading, without diagnosis of hypertension: Secondary | ICD-10-CM

## 2024-11-08 DIAGNOSIS — R7303 Prediabetes: Secondary | ICD-10-CM

## 2024-11-08 NOTE — Patient Instructions (Addendum)
" °  F/U in 6 months   Zepbound  dose needs to be reduced   Thankful much better   CBC, iron  , ferritin, lipid, cmp and eGFr, tSH and vit D last week in February  It is important that you exercise regularly at least 30 minutes 5 times a week. If you develop chest pain, have severe difficulty breathing, or feel very tired, stop exercising immediately and seek medical attention  Thanks for choosing [facility], we consider it a privelige to serve you.  "

## 2024-11-11 ENCOUNTER — Encounter: Payer: Self-pay | Admitting: Family Medicine

## 2024-11-11 DIAGNOSIS — R790 Abnormal level of blood mineral: Secondary | ICD-10-CM | POA: Insufficient documentation

## 2024-11-11 NOTE — Assessment & Plan Note (Signed)
 Updated lab needed at/ before next visit.

## 2024-11-11 NOTE — Assessment & Plan Note (Signed)
 Recurrent headaches which are disabling, Neurology manages

## 2024-11-11 NOTE — Assessment & Plan Note (Signed)
 Managed by Neurology, still problematic however per pt report may be getting some relief , reduced frequency on Mounjaro , management per Neurology

## 2024-11-22 ENCOUNTER — Inpatient Hospital Stay: Attending: Oncology

## 2024-11-22 ENCOUNTER — Telehealth: Admitting: Neurosurgery

## 2024-11-29 ENCOUNTER — Inpatient Hospital Stay: Admitting: Oncology

## 2024-12-16 ENCOUNTER — Ambulatory Visit: Admitting: Women's Health

## 2025-01-06 ENCOUNTER — Telehealth: Admitting: Family Medicine

## 2025-05-09 ENCOUNTER — Ambulatory Visit: Payer: Self-pay | Admitting: Family Medicine
